# Patient Record
Sex: Male | Born: 1955 | Race: White | Hispanic: No | Marital: Married | Smoking: Current every day smoker
Health system: Southern US, Community
[De-identification: ages and names within clinical notes are randomized; demographics above are authoritative.]

## PROBLEM LIST (undated history)

## (undated) DIAGNOSIS — I10 Essential (primary) hypertension: Secondary | ICD-10-CM

## (undated) DIAGNOSIS — M5430 Sciatica, unspecified side: Secondary | ICD-10-CM

## (undated) DIAGNOSIS — K219 Gastro-esophageal reflux disease without esophagitis: Secondary | ICD-10-CM

## (undated) DIAGNOSIS — I82409 Acute embolism and thrombosis of unspecified deep veins of unspecified lower extremity: Secondary | ICD-10-CM

## (undated) DIAGNOSIS — M549 Dorsalgia, unspecified: Secondary | ICD-10-CM

## (undated) DIAGNOSIS — C9000 Multiple myeloma not having achieved remission: Secondary | ICD-10-CM

## (undated) DIAGNOSIS — E7800 Pure hypercholesterolemia, unspecified: Secondary | ICD-10-CM

## (undated) HISTORY — DX: Sciatica, unspecified side: M54.30

## (undated) HISTORY — PX: BONE MARROW TRANSPLANT: SHX200

## (undated) HISTORY — DX: Acute embolism and thrombosis of unspecified deep veins of unspecified lower extremity: I82.409

## (undated) HISTORY — PX: OTHER SURGICAL HISTORY: SHX169

## (undated) HISTORY — PX: KNEE SURGERY: SHX244

## (undated) HISTORY — PX: TONSILLECTOMY: SUR1361

## (undated) HISTORY — DX: Essential (primary) hypertension: I10

## (undated) HISTORY — DX: Pure hypercholesterolemia, unspecified: E78.00

---

## 2001-07-06 ENCOUNTER — Encounter: Payer: Self-pay | Admitting: Emergency Medicine

## 2001-07-06 ENCOUNTER — Emergency Department (HOSPITAL_COMMUNITY): Admission: EM | Admit: 2001-07-06 | Discharge: 2001-07-06 | Payer: Self-pay | Admitting: Emergency Medicine

## 2001-07-06 ENCOUNTER — Inpatient Hospital Stay (HOSPITAL_COMMUNITY): Admission: EM | Admit: 2001-07-06 | Discharge: 2001-07-06 | Payer: Self-pay | Admitting: *Deleted

## 2001-07-30 ENCOUNTER — Encounter (HOSPITAL_COMMUNITY): Admission: RE | Admit: 2001-07-30 | Discharge: 2001-08-29 | Payer: Self-pay | Admitting: Orthopedic Surgery

## 2003-03-12 ENCOUNTER — Emergency Department (HOSPITAL_COMMUNITY): Admission: EM | Admit: 2003-03-12 | Discharge: 2003-03-12 | Payer: Self-pay | Admitting: Emergency Medicine

## 2003-07-22 ENCOUNTER — Ambulatory Visit (HOSPITAL_COMMUNITY): Admission: RE | Admit: 2003-07-22 | Discharge: 2003-07-22 | Payer: Self-pay | Admitting: Family Medicine

## 2003-10-30 ENCOUNTER — Encounter (HOSPITAL_COMMUNITY): Admission: RE | Admit: 2003-10-30 | Discharge: 2003-11-06 | Payer: Self-pay | Admitting: Internal Medicine

## 2003-11-10 ENCOUNTER — Encounter (HOSPITAL_COMMUNITY): Admission: RE | Admit: 2003-11-10 | Discharge: 2003-12-10 | Payer: Self-pay | Admitting: Internal Medicine

## 2006-04-03 ENCOUNTER — Ambulatory Visit (HOSPITAL_COMMUNITY): Admission: RE | Admit: 2006-04-03 | Discharge: 2006-04-03 | Payer: Self-pay | Admitting: *Deleted

## 2008-03-02 ENCOUNTER — Ambulatory Visit (HOSPITAL_COMMUNITY): Admission: RE | Admit: 2008-03-02 | Discharge: 2008-03-02 | Payer: Self-pay | Admitting: Family Medicine

## 2008-03-06 ENCOUNTER — Ambulatory Visit (HOSPITAL_COMMUNITY): Admission: RE | Admit: 2008-03-06 | Discharge: 2008-03-06 | Payer: Self-pay | Admitting: Family Medicine

## 2008-06-16 ENCOUNTER — Emergency Department (HOSPITAL_COMMUNITY): Admission: EM | Admit: 2008-06-16 | Discharge: 2008-06-16 | Payer: Self-pay | Admitting: Emergency Medicine

## 2009-01-27 ENCOUNTER — Ambulatory Visit (HOSPITAL_COMMUNITY): Admission: RE | Admit: 2009-01-27 | Discharge: 2009-01-27 | Payer: Self-pay | Admitting: Internal Medicine

## 2009-03-08 ENCOUNTER — Ambulatory Visit (HOSPITAL_COMMUNITY): Admission: RE | Admit: 2009-03-08 | Discharge: 2009-03-08 | Payer: Self-pay | Admitting: Family Medicine

## 2010-02-27 ENCOUNTER — Encounter: Payer: Self-pay | Admitting: Internal Medicine

## 2010-04-24 LAB — CREATININE, SERUM

## 2010-06-24 NOTE — Procedures (Signed)
NAME:  Craig Rivera, Craig Rivera NO.:  [id number]   MEDICAL RECORD NO.:  [id number]                   PATIENT TYPE:  EMS   LOCATION:  ED                                   FACILITY:  [facility]   PHYSICIAN:  Edward L. Juanetta Gosling, M.D.             DATE OF BIRTH:  April 17, 1955   DATE OF PROCEDURE:  DATE OF DISCHARGE:  03/12/2003                                EKG INTERPRETATION   FINDINGS:  1. The rhythm is sinus rhythm with a rate in the 60's.  2. There is an incomplete right bundle branch block.  3. T-wave abnormalities are seen which are nonspecific but could be related     to ischemia.  4. Clinical correlation is suggested.   IMPRESSION:  Abnormal electrocardiogram.      ___________________________________________                                            Oneal Deputy. Juanetta Gosling, M.D.   Gwenlyn Found  D:  03/13/2003  T:  03/13/2003  Job:  [id number]

## 2010-06-24 NOTE — Op Note (Signed)
[place]  Patient:    Craig Rivera, Craig Rivera Visit Number: [id number] MRN: [id number]          Service Type: SUR Location: 1800 1830 01 Attending Physician:  Milly Jakob Dictated by:   Harvie Junior, M.D. Proc. Date: 07/06/01 Admit Date:  07/06/2001 Discharge Date: 07/06/2001                             Operative Report  PREOPERATIVE DIAGNOSIS:  Dislocated left shoulder.  POSTOPERATIVE DIAGNOSIS:  Dislocated left shoulder.  PROCEDURE:  Closed reduction of left shoulder.  SURGEON:  Harvie Junior, M.D.  ASSISTANT:  Anise Salvo. Chestine Spore, P.A.  ANESTHESIA:  General.  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  He was ultimately seen at [facility], where attempts were made to reduce the shoulder unsuccessfully.  He was brought down to [facility] emergency room where additional attempts were unsuccessful to reduce the shoulder.  Because of this he is brought to the operating room for closed reduction.  Preoperative x-rays showed an anterior dislocation with an obvious Hill-Sachs lesion.  DESCRIPTION OF PROCEDURE:  The patient was taken to the operating room and after adequate anesthesia was obtained with a general anesthetic, the patient was positioned supine and the left arm was then evaluated under anesthesia and straight in-line traction was used, and the shoulder was popped back into place.  At that point the arm could be put through a range of motion, really not tending to be unstable even at 90 degrees of abduction.  At that point the mini-C-arm was used to make sure that the shoulder was, in fact, located.  At that point the patient was placed into a shoulder immobilizer and taken to recovery, where he was noted to be in satisfactory condition.  Estimated blood loss for the procedure was none. Dictated by:   Harvie Junior, M.D. Attending Physician:  Milly Jakob DD:   07/06/01 TD:  07/08/01 Job: [id number]

## 2010-08-29 ENCOUNTER — Ambulatory Visit (INDEPENDENT_AMBULATORY_CARE_PROVIDER_SITE_OTHER): Payer: PRIVATE HEALTH INSURANCE | Admitting: Gastroenterology

## 2010-08-29 ENCOUNTER — Encounter: Payer: Self-pay | Admitting: Gastroenterology

## 2010-08-29 DIAGNOSIS — Z1211 Encounter for screening for malignant neoplasm of colon: Secondary | ICD-10-CM

## 2010-08-29 NOTE — Progress Notes (Signed)
Referring Provider: Dr. Sherwood Gambler Primary Care Physician: Dr. Sherwood Gambler Primary Gastroenterologist:  Dr. Jena Gauss   Chief Complaint  Patient presents with  . Colonoscopy    change in bowel    HPI:   Craig Rivera is a pleasant 55 year old Caucasian male who presents at the request of Dr. Sherwood Gambler for an initial screening colonoscopy and due to change in bowel habits. Patient reports that over the past few years, he has noticed increasing constipation. Used to have a BM daily, could "set watch by it". A month ago noted small amount of brbpr. Mild lower abdominal cramping prior to BM but relieved after defecation. Denies any bloating or abdominal distension. Denies N/V, loss of appetite, or wt loss. No FH of colon cancer, but he does report his father and sister both had polyps. He does not know what kind.   Past Medical History  Diagnosis Date  . HTN (hypertension)   . Hypercholesterolemia   . Sciatic nerve pain     Past Surgical History  Procedure Date  . Inguinal hernia repair bilateral     as child   . Tonsillectomy   . Knee surgery     Current Outpatient Prescriptions  Medication Sig Dispense Refill  . acetaminophen (TYLENOL) 325 MG tablet Take 650 mg by mouth every 6 (six) hours as needed.        Marland Kitchen amLODipine (NORVASC) 10 MG tablet       . aspirin 81 MG tablet Take 81 mg by mouth daily.        . fish oil-omega-3 fatty acids 1000 MG capsule Take 2 g by mouth daily.        Marland Kitchen loratadine (CLARITIN) 10 MG tablet Take 10 mg by mouth daily.        . Multiple Vitamin (MULTIVITAMIN) capsule Take 1 capsule by mouth daily.        Marland Kitchen NIASPAN 1000 MG CR tablet       . peg 3350 powder (MOVIPREP) 100 G SOLR Take 1 kit (100 g total) by mouth once. As directed Please purchase 1 Fleets enema to use with the prep  1 kit  0    Allergies as of 08/29/2010 - Review Complete 08/29/2010  Allergen Reaction Noted  . Ceclor (cefaclor)  08/29/2010  . Penicillins Diarrhea 08/29/2010    Family History  Problem  Relation Age of Onset  . Colon polyps Father     passed away, Alzhemiers  . Colon polyps Sister   . Migraines Mother     deceased    History   Social History  . Marital Status: Married    Spouse Name: N/A    Number of Children: N/A  . Years of Education: N/A   Occupational History  . Not on file.   Social History Main Topics  . Smoking status: Current Everyday Smoker -- 1.0 packs/day for 40 years    Types: Cigarettes  . Smokeless tobacco: Not on file  . Alcohol Use: Yes     seldom, socially  . Drug Use: No  . Sexually Active: Not on file    Review of Systems: Gen: Denies any fever, chills, loss of appetite, fatigue, weight loss. CV: Denies chest pain, heart palpitations, syncope, peripheral edema. Resp: Denies shortness of breath with rest, cough, wheezing GI: Denies dysphagia or odynophagia. Denies hematemesis, fecal incontinence, or jaundice.  GU : Denies urinary burning, urinary frequency, urinary incontinence.  MS: Denies joint pain, muscle weakness, cramps, limited movement Derm: Denies rash, itching, dry  skin Psych: Denies depression, anxiety, confusion or memory loss  Heme: Denies bruising, bleeding, and enlarged lymph nodes.  Physical Exam: BP 137/78  Pulse 76  Temp(Src) 98.3 F (36.8 C) (Temporal)  Ht 5\' 11"  (1.803 m)  Wt 226 lb (102.513 kg)  BMI 31.52 kg/m2 General:   Alert and oriented. Well-developed, well-nourished, pleasant and cooperative. Head:  Normocephalic and atraumatic. Eyes:  Conjunctiva pink, sclera clear, no icterus.   Conjunctiva pink. Ears:  Normal auditory acuity. Nose:  No deformity, discharge,  or lesions. Mouth:  No deformity or lesions, mucosa pink and moist.  Neck:  Supple, without mass or thyromegaly. Lungs:  Clear to auscultation bilaterally, without wheezing, rales, or rhonchi.  Heart:  S1, S2 present without murmurs noted.  Abdomen:  +BS, soft, non-tender and non-distended. Small possible umbilical hernia, easily reducible.  Without mass or HSM. No rebound or guarding.  Rectal:  Deferred  Msk:  Symmetrical without gross deformities. Normal posture. Extremities:  Without clubbing or edema. Neurologic:  Alert and  oriented x4;  grossly normal neurologically. Skin:  Intact, warm and dry without significant lesions or rashes Cervical Nodes:  No significant cervical adenopathy. Psych:  Alert and cooperative. Normal mood and affect.

## 2010-08-29 NOTE — Patient Instructions (Signed)
We have set you up for a colonoscopy with Dr. Jena Gauss  Follow a high fiber diet.  We will provide further recommendations after this is completed.

## 2010-08-30 ENCOUNTER — Encounter: Payer: Self-pay | Admitting: Gastroenterology

## 2010-08-30 ENCOUNTER — Other Ambulatory Visit: Payer: Self-pay

## 2010-08-30 DIAGNOSIS — Z1211 Encounter for screening for malignant neoplasm of colon: Secondary | ICD-10-CM | POA: Insufficient documentation

## 2010-08-30 MED ORDER — PEG-KCL-NACL-NASULF-NA ASC-C 100 G PO SOLR: 1.0000 | Freq: Once | ORAL | Status: DC

## 2010-08-30 NOTE — Progress Notes (Signed)
Cc to PCP 

## 2010-08-30 NOTE — Assessment & Plan Note (Signed)
55 year old Caucasian male with need for initial screening colonoscopy, somewhat overdue. Subtle change in bowel habits over the past few years, going from regular BMs daily to intermittent constipation. Small amount of brbpr 1 month ago. May be due to benign anorectal source; unable to exclude occult malignancy. Will proceed with colonoscopy in the very near future. As of note, pt reports father and sister had colon polyps, unsure what type.  Proceed with TCS with Dr. Jena Gauss in near future: the risks, benefits, and alternatives have been discussed with the patient in detail. The patient states understanding and desires to proceed. High fiber diet Further recommendations to follow

## 2010-09-12 ENCOUNTER — Other Ambulatory Visit: Payer: Self-pay | Admitting: Internal Medicine

## 2010-09-12 ENCOUNTER — Ambulatory Visit (HOSPITAL_COMMUNITY)
Admission: RE | Admit: 2010-09-12 | Discharge: 2010-09-12 | Disposition: A | Discharge status: Home / Self Care | Payer: BC Managed Care – PPO | Source: Ambulatory Visit | Attending: Internal Medicine | Admitting: Internal Medicine

## 2010-09-12 ENCOUNTER — Encounter: Payer: PRIVATE HEALTH INSURANCE | Admitting: Internal Medicine

## 2010-09-12 ENCOUNTER — Encounter (HOSPITAL_COMMUNITY): Payer: Self-pay | Admitting: *Deleted

## 2010-09-12 ENCOUNTER — Encounter (HOSPITAL_COMMUNITY)
Admission: RE | Disposition: A | Discharge status: Home / Self Care | Payer: Self-pay | Source: Ambulatory Visit | Attending: Internal Medicine

## 2010-09-12 DIAGNOSIS — Z8371 Family history of colonic polyps: Secondary | ICD-10-CM | POA: Insufficient documentation

## 2010-09-12 DIAGNOSIS — R198 Other specified symptoms and signs involving the digestive system and abdomen: Secondary | ICD-10-CM

## 2010-09-12 DIAGNOSIS — Z83719 Family history of colon polyps, unspecified: Secondary | ICD-10-CM | POA: Insufficient documentation

## 2010-09-12 DIAGNOSIS — Z79899 Other long term (current) drug therapy: Secondary | ICD-10-CM | POA: Insufficient documentation

## 2010-09-12 DIAGNOSIS — D126 Benign neoplasm of colon, unspecified: Secondary | ICD-10-CM

## 2010-09-12 DIAGNOSIS — D129 Benign neoplasm of anus and anal canal: Secondary | ICD-10-CM | POA: Insufficient documentation

## 2010-09-12 DIAGNOSIS — Z7982 Long term (current) use of aspirin: Secondary | ICD-10-CM | POA: Insufficient documentation

## 2010-09-12 DIAGNOSIS — D128 Benign neoplasm of rectum: Secondary | ICD-10-CM | POA: Insufficient documentation

## 2010-09-12 DIAGNOSIS — K620 Anal polyp: Secondary | ICD-10-CM

## 2010-09-12 DIAGNOSIS — I10 Essential (primary) hypertension: Secondary | ICD-10-CM | POA: Insufficient documentation

## 2010-09-12 DIAGNOSIS — E785 Hyperlipidemia, unspecified: Secondary | ICD-10-CM | POA: Insufficient documentation

## 2010-09-12 DIAGNOSIS — K621 Rectal polyp: Secondary | ICD-10-CM

## 2010-09-12 DIAGNOSIS — Z800 Family history of malignant neoplasm of digestive organs: Secondary | ICD-10-CM

## 2010-09-12 HISTORY — PX: COLONOSCOPY: SHX5424

## 2010-09-12 SURGERY — COLONOSCOPY
Anesthesia: Moderate Sedation

## 2010-09-12 MED ORDER — MIDAZOLAM HCL 5 MG/5ML IJ SOLN
INTRAMUSCULAR | Status: AC
  Filled 2010-09-12: qty 10

## 2010-09-12 MED ORDER — MIDAZOLAM HCL 5 MG/5ML IJ SOLN
INTRAMUSCULAR | Status: DC | PRN
  Administered 2010-09-12 (×2): 2 mg via INTRAVENOUS
  Administered 2010-09-12: 1 mg via INTRAVENOUS

## 2010-09-12 MED ORDER — SODIUM CHLORIDE 0.45 % IV SOLN
Freq: Once | INTRAVENOUS | Status: AC
  Administered 2010-09-12: 12:00:00 via INTRAVENOUS

## 2010-09-12 MED ORDER — STERILE WATER FOR IRRIGATION IR SOLN
Status: DC | PRN
  Administered 2010-09-12: 12:00:00

## 2010-09-12 MED ORDER — MEPERIDINE HCL 100 MG/ML IJ SOLN
INTRAMUSCULAR | Status: AC
  Filled 2010-09-12: qty 2

## 2010-09-12 MED ORDER — MEPERIDINE HCL 100 MG/ML IJ SOLN
INTRAMUSCULAR | Status: DC | PRN
  Administered 2010-09-12: 25 mg via INTRAVENOUS
  Administered 2010-09-12: 50 mg via INTRAVENOUS

## 2010-09-12 NOTE — H&P (Signed)
Gerrit Halls, NP  08/30/2010 12:45 PM  Signed     Referring Provider: Dr. Sherwood Gambler Primary Care Physician: Dr. Sherwood Gambler Primary Gastroenterologist:  Dr. Jena Gauss     Chief Complaint   Patient presents with   .  Colonoscopy       change in bowel      HPI:    Mr. Fragoso is a pleasant 55 year old Caucasian male who presents at the request of Dr. Sherwood Gambler for an initial screening colonoscopy and due to change in bowel habits. Patient reports that over the past few years, he has noticed increasing constipation. Used to have a BM daily, could "set watch by it". A month ago noted small amount of brbpr. Mild lower abdominal cramping prior to BM but relieved after defecation. Denies any bloating or abdominal distension. Denies N/V, loss of appetite, or wt loss. No FH of colon cancer, but he does report his father and sister both had polyps. He does not know what kind.     Past Medical History   Diagnosis  Date   .  HTN (hypertension)     .  Hypercholesterolemia     .  Sciatic nerve pain         Past Surgical History   Procedure  Date   .  Inguinal hernia repair bilateral         as child    .  Tonsillectomy     .  Knee surgery         Current Outpatient Prescriptions   Medication  Sig  Dispense  Refill   .  acetaminophen (TYLENOL) 325 MG tablet  Take 650 mg by mouth every 6 (six) hours as needed.           Marland Kitchen  amLODipine (NORVASC) 10 MG tablet           .  aspirin 81 MG tablet  Take 81 mg by mouth daily.           .  fish oil-omega-3 fatty acids 1000 MG capsule  Take 2 g by mouth daily.           Marland Kitchen  loratadine (CLARITIN) 10 MG tablet  Take 10 mg by mouth daily.           .  Multiple Vitamin (MULTIVITAMIN) capsule  Take 1 capsule by mouth daily.           Marland Kitchen  NIASPAN 1000 MG CR tablet           .  peg 3350 powder (MOVIPREP) 100 G SOLR  Take 1 kit (100 g total) by mouth once. As directed  Please purchase 1 Fleets enema to use with the prep   1 kit   0       Allergies as of 08/29/2010 - Review  Complete 08/29/2010   Allergen  Reaction  Noted   .  Ceclor (cefaclor)    08/29/2010   .  Penicillins  Diarrhea  08/29/2010       Family History   Problem  Relation  Age of Onset   .  Colon polyps  Father         passed away, Alzhemiers   .  Colon polyps  Sister     .  Migraines  Mother         deceased       History       Social History   .  Marital Status:  Married  Spouse Name:  N/A       Number of Children:  N/A   .  Years of Education:  N/A       Occupational History   .  Not on file.       Social History Main Topics   .  Smoking status:  Current Everyday Smoker -- 1.0 packs/day for 40 years       Types:  Cigarettes   .  Smokeless tobacco:  Not on file   .  Alcohol Use:  Yes         seldom, socially   .  Drug Use:  No   .  Sexually Active:  Not on file        Review of Systems: Gen: Denies any fever, chills, loss of appetite, fatigue, weight loss. CV: Denies chest pain, heart palpitations, syncope, peripheral edema. Resp: Denies shortness of breath with rest, cough, wheezing GI: Denies dysphagia or odynophagia. Denies hematemesis, fecal incontinence, or jaundice.   GU : Denies urinary burning, urinary frequency, urinary incontinence.   MS: Denies joint pain, muscle weakness, cramps, limited movement Derm: Denies rash, itching, dry skin Psych: Denies depression, anxiety, confusion or memory loss   Heme: Denies bruising, bleeding, and enlarged lymph nodes.   Physical Exam: BP 137/78  Pulse 76  Temp(Src) 98.3 F (36.8 C) (Temporal)  Ht 5\' 11"  (1.803 m)  Wt 226 lb (102.513 kg)  BMI 31.52 kg/m2 General:   Alert and oriented. Well-developed, well-nourished, pleasant and cooperative. Head:  Normocephalic and atraumatic. Eyes:  Conjunctiva pink, sclera clear, no icterus.   Conjunctiva pink. Ears:  Normal auditory acuity. Nose:  No deformity, discharge,  or lesions. Mouth:  No deformity or lesions, mucosa pink and moist.   Neck:  Supple, without  mass or thyromegaly. Lungs:  Clear to auscultation bilaterally, without wheezing, rales, or rhonchi.   Heart:  S1, S2 present without murmurs noted.   Abdomen:  +BS, soft, non-tender and non-distended. Small possible umbilical hernia, easily reducible. Without mass or HSM. No rebound or guarding.   Rectal:  Deferred   Msk:  Symmetrical without gross deformities. Normal posture. Extremities:  Without clubbing or edema. Neurologic:  Alert and  oriented x4;  grossly normal neurologically. Skin:  Intact, warm and dry without significant lesions or rashes Cervical Nodes:  No significant cervical adenopathy. Psych:  Alert and cooperative. Normal mood and affect.       Glendora Score  08/30/2010  4:55 PM  Signed Cc to PCP        Encounter for screening colonoscopy - Gerrit Halls, NP  08/30/2010 12:45 PM  Signed 55 year old Caucasian male with need for initial screening colonoscopy, somewhat overdue. Subtle change in bowel habits over the past few years, going from regular BMs daily to intermittent constipation. Small amount of brbpr 1 month ago. May be due to benign anorectal source; unable to exclude occult malignancy. Will proceed with colonoscopy in the very near future. As of note, pt reports father and sister had colon polyps, unsure what type.   Proceed with TCS with Dr. Jena Gauss in near future: the risks, benefits, and alternatives have been discussed with the patient in detail. The patient states understanding and desires to proceed. High fiber diet Further recommendations to follow    I have seen the patient prior to the procedure(s) today and reviewed the history and physical / consultation from 08/16/10.  There have been no changes. After consideration of the risks, benefits, alternatives and imponderables,  the patient has consented to the procedure(s).

## 2010-09-20 ENCOUNTER — Encounter (HOSPITAL_COMMUNITY): Payer: Self-pay | Admitting: Internal Medicine

## 2010-10-07 ENCOUNTER — Other Ambulatory Visit (HOSPITAL_COMMUNITY): Payer: Self-pay | Admitting: Family Medicine

## 2010-10-07 DIAGNOSIS — R05 Cough: Secondary | ICD-10-CM

## 2010-10-07 DIAGNOSIS — J209 Acute bronchitis, unspecified: Secondary | ICD-10-CM

## 2010-10-12 ENCOUNTER — Ambulatory Visit (HOSPITAL_COMMUNITY)
Admission: RE | Admit: 2010-10-12 | Discharge: 2010-10-12 | Disposition: A | Discharge status: Home / Self Care | Payer: BC Managed Care – PPO | Source: Ambulatory Visit | Attending: Family Medicine | Admitting: Family Medicine

## 2010-10-12 DIAGNOSIS — J209 Acute bronchitis, unspecified: Secondary | ICD-10-CM | POA: Insufficient documentation

## 2010-10-12 DIAGNOSIS — E7800 Pure hypercholesterolemia, unspecified: Secondary | ICD-10-CM | POA: Insufficient documentation

## 2010-10-12 DIAGNOSIS — J984 Other disorders of lung: Secondary | ICD-10-CM | POA: Insufficient documentation

## 2010-10-12 DIAGNOSIS — R05 Cough: Secondary | ICD-10-CM

## 2010-10-12 MED ORDER — IOHEXOL 300 MG/ML  SOLN
80.0000 mL | Freq: Once | INTRAMUSCULAR | Status: AC | PRN
  Administered 2010-10-12: 80 mL via INTRAVENOUS

## 2011-04-20 DIAGNOSIS — L821 Other seborrheic keratosis: Secondary | ICD-10-CM | POA: Insufficient documentation

## 2011-05-10 DIAGNOSIS — C4491 Basal cell carcinoma of skin, unspecified: Secondary | ICD-10-CM | POA: Insufficient documentation

## 2013-04-02 DIAGNOSIS — C9000 Multiple myeloma not having achieved remission: Secondary | ICD-10-CM | POA: Insufficient documentation

## 2013-10-23 DIAGNOSIS — Z9484 Stem cells transplant status: Secondary | ICD-10-CM | POA: Insufficient documentation

## 2013-12-01 DIAGNOSIS — R7989 Other specified abnormal findings of blood chemistry: Secondary | ICD-10-CM | POA: Insufficient documentation

## 2013-12-08 DIAGNOSIS — L509 Urticaria, unspecified: Secondary | ICD-10-CM | POA: Insufficient documentation

## 2013-12-15 DIAGNOSIS — Z720 Tobacco use: Secondary | ICD-10-CM | POA: Insufficient documentation

## 2013-12-15 DIAGNOSIS — F17210 Nicotine dependence, cigarettes, uncomplicated: Secondary | ICD-10-CM | POA: Insufficient documentation

## 2014-01-19 DIAGNOSIS — E876 Hypokalemia: Secondary | ICD-10-CM | POA: Insufficient documentation

## 2014-03-03 DIAGNOSIS — K5732 Diverticulitis of large intestine without perforation or abscess without bleeding: Secondary | ICD-10-CM | POA: Insufficient documentation

## 2014-03-03 DIAGNOSIS — Z9481 Bone marrow transplant status: Secondary | ICD-10-CM | POA: Insufficient documentation

## 2014-03-03 DIAGNOSIS — D801 Nonfamilial hypogammaglobulinemia: Secondary | ICD-10-CM | POA: Insufficient documentation

## 2014-04-07 DIAGNOSIS — R52 Pain, unspecified: Secondary | ICD-10-CM | POA: Insufficient documentation

## 2014-05-06 DIAGNOSIS — E860 Dehydration: Secondary | ICD-10-CM | POA: Insufficient documentation

## 2014-06-29 DIAGNOSIS — K5901 Slow transit constipation: Secondary | ICD-10-CM | POA: Insufficient documentation

## 2014-07-13 DIAGNOSIS — D171 Benign lipomatous neoplasm of skin and subcutaneous tissue of trunk: Secondary | ICD-10-CM | POA: Insufficient documentation

## 2014-07-13 DIAGNOSIS — S22000A Wedge compression fracture of unspecified thoracic vertebra, initial encounter for closed fracture: Secondary | ICD-10-CM | POA: Insufficient documentation

## 2014-08-03 NOTE — Patient Instructions (Signed)
Craig Rivera  08/03/2014     @PREFPERIOPPHARMACY @   Your procedure is scheduled on 08/21/2014  Report to [facility] at 10:00 A.M.  Call this number if you have problems the morning of surgery:  [phone]   Remember:  Do not eat food or drink liquids after midnight.  Take these medicines the morning of surgery with A SIP OF WATER Amlodipine, Fentanyl patch, Flonase, Levaquin, Flomax, Protonix, Oxydosone   Do not wear jewelry, make-up or nail polish.  Do not wear lotions, powders, or perfumes.  You may wear deodorant.  Do not shave 48 hours prior to surgery.  Men may shave face and neck.  Do not bring valuables to the hospital.  [facility] is not responsible for any belongings or valuables.  Contacts, dentures or bridgework may not be worn into surgery.  Leave your suitcase in the car.  After surgery it may be brought to your room.  For patients admitted to the hospital, discharge time will be determined by your treatment team.  Patients discharged the day of surgery will not be allowed to drive home.    Please read over the following fact sheets that you were given. Anesthesia Post-op Instructions      PATIENT INSTRUCTIONS POST-ANESTHESIA  IMMEDIATELY FOLLOWING SURGERY:  Do not drive or operate machinery for the first twenty four hours after surgery.  Do not make any important decisions for twenty four hours after surgery or while taking narcotic pain medications or sedatives.  If you develop intractable nausea and vomiting or a severe headache please notify your doctor immediately.  FOLLOW-UP:  Please make an appointment with your surgeon as instructed. You do not need to follow up with anesthesia unless specifically instructed to do so.  WOUND CARE INSTRUCTIONS (if applicable):  Keep a dry clean dressing on the anesthesia/puncture wound site if there is drainage.  Once the wound has quit draining you may leave it open to air.  Generally you should leave the bandage  intact for twenty four hours unless there is drainage.  If the epidural site drains for more than 36-48 hours please call the anesthesia department.  QUESTIONS?:  Please feel free to call your physician or the hospital operator if you have any questions, and they will be happy to assist you.      Cataract A cataract is a clouding of the lens of the eye. When a lens becomes cloudy, vision is reduced based on the degree and nature of the clouding. Many cataracts reduce vision to some degree. Some cataracts make people more near-sighted as they develop. Other cataracts increase glare. Cataracts that are ignored and become worse can sometimes look white. The white color can be seen through the pupil. CAUSES   Aging. However, cataracts may occur at any age, even in newborns.  Certain drugs.  Trauma to the eye.  Certain diseases such as diabetes.  Specific eye diseases such as chronic inflammation inside the eye or a sudden attack of a rare form of glaucoma.  Inherited or acquired medical problems. SYMPTOMS   Gradual, progressive drop in vision in the affected eye.  Severe, rapid visual loss. This most often happens when trauma is the cause. DIAGNOSIS  To detect a cataract, an eye doctor examines the lens. Cataracts are best diagnosed with an exam of the eyes with the pupils enlarged (dilated) by drops.  TREATMENT  For an early cataract, vision may improve by using different eyeglasses or stronger lighting. If that does  not help your vision, surgery is the only effective treatment. A cataract needs to be surgically removed when vision loss interferes with your everyday activities, such as driving, reading, or watching TV. A cataract may also have to be removed if it prevents examination or treatment of another eye problem. Surgery removes the cloudy lens and usually replaces it with a substitute lens (intraocular lens, IOL).  At a time when both you and your doctor agree, the cataract will be  surgically removed. If you have cataracts in both eyes, only one is usually removed at a time. This allows the operated eye to heal and be out of danger from any possible problems after surgery (such as infection or poor wound healing). In rare cases, a cataract may be doing damage to your eye. In these cases, your caregiver may advise surgical removal right away. The vast majority of people who have cataract surgery have better vision afterward. HOME CARE INSTRUCTIONS  If you are not planning surgery, you may be asked to do the following:  Use different eyeglasses.  Use stronger or brighter lighting.  Ask your eye doctor about reducing your medicine dose or changing medicines if it is thought that a medicine caused your cataract. Changing medicines does not make the cataract go away on its own.  Become familiar with your surroundings. Poor vision can lead to injury. Avoid bumping into things on the affected side. You are at a higher risk for tripping or falling.  Exercise extreme care when driving or operating machinery.  Wear sunglasses if you are sensitive to bright light or experiencing problems with glare. SEEK IMMEDIATE MEDICAL CARE IF:   You have a worsening or sudden vision loss.  You notice redness, swelling, or increasing pain in the eye.  You have a fever. Document Released: 01/23/2005 Document Revised: 04/17/2011 Document Reviewed: 09/16/2010 [facility] Patient Information 2015 [place], [place]. This information is not intended to replace advice given to you by your health care provider. Make sure you discuss any questions you have with your health care provider.

## 2014-08-04 ENCOUNTER — Other Ambulatory Visit: Payer: Self-pay

## 2014-08-04 ENCOUNTER — Encounter (HOSPITAL_COMMUNITY)
Admission: RE | Admit: 2014-08-04 | Discharge: 2014-08-04 | Disposition: A | Discharge status: Home / Self Care | Payer: Managed Care, Other (non HMO) | Source: Ambulatory Visit | Attending: Ophthalmology | Admitting: Ophthalmology

## 2014-08-04 ENCOUNTER — Encounter (HOSPITAL_COMMUNITY): Payer: Self-pay

## 2014-08-04 DIAGNOSIS — H2511 Age-related nuclear cataract, right eye: Secondary | ICD-10-CM | POA: Diagnosis not present

## 2014-08-04 DIAGNOSIS — Z01818 Encounter for other preprocedural examination: Secondary | ICD-10-CM | POA: Diagnosis not present

## 2014-08-04 HISTORY — DX: Multiple myeloma not having achieved remission: C90.00

## 2014-08-04 HISTORY — DX: Dorsalgia, unspecified: M54.9

## 2014-08-11 ENCOUNTER — Ambulatory Visit (HOSPITAL_COMMUNITY): Payer: Managed Care, Other (non HMO) | Admitting: Anesthesiology

## 2014-08-11 ENCOUNTER — Encounter (HOSPITAL_COMMUNITY)
Admission: RE | Disposition: A | Discharge status: Home / Self Care | Payer: Self-pay | Source: Ambulatory Visit | Attending: Ophthalmology

## 2014-08-11 ENCOUNTER — Ambulatory Visit (HOSPITAL_COMMUNITY)
Admission: RE | Admit: 2014-08-11 | Discharge: 2014-08-11 | Disposition: A | Discharge status: Home / Self Care | Payer: Managed Care, Other (non HMO) | Source: Ambulatory Visit | Attending: Ophthalmology | Admitting: Ophthalmology

## 2014-08-11 ENCOUNTER — Encounter (HOSPITAL_COMMUNITY): Payer: Self-pay | Admitting: *Deleted

## 2014-08-11 DIAGNOSIS — Z79899 Other long term (current) drug therapy: Secondary | ICD-10-CM | POA: Insufficient documentation

## 2014-08-11 DIAGNOSIS — I4510 Unspecified right bundle-branch block: Secondary | ICD-10-CM | POA: Diagnosis not present

## 2014-08-11 DIAGNOSIS — Z8371 Family history of colonic polyps: Secondary | ICD-10-CM | POA: Diagnosis not present

## 2014-08-11 DIAGNOSIS — I10 Essential (primary) hypertension: Secondary | ICD-10-CM | POA: Insufficient documentation

## 2014-08-11 DIAGNOSIS — F17210 Nicotine dependence, cigarettes, uncomplicated: Secondary | ICD-10-CM | POA: Insufficient documentation

## 2014-08-11 DIAGNOSIS — M5430 Sciatica, unspecified side: Secondary | ICD-10-CM | POA: Diagnosis not present

## 2014-08-11 DIAGNOSIS — E780 Pure hypercholesterolemia: Secondary | ICD-10-CM | POA: Insufficient documentation

## 2014-08-11 DIAGNOSIS — Z880 Allergy status to penicillin: Secondary | ICD-10-CM | POA: Diagnosis not present

## 2014-08-11 DIAGNOSIS — H2511 Age-related nuclear cataract, right eye: Secondary | ICD-10-CM | POA: Insufficient documentation

## 2014-08-11 HISTORY — PX: CATARACT EXTRACTION W/PHACO: SHX586

## 2014-08-11 SURGERY — PHACOEMULSIFICATION, CATARACT, WITH IOL INSERTION
Anesthesia: Monitor Anesthesia Care | Laterality: Right

## 2014-08-11 MED ORDER — NA HYALUR & NA CHOND-NA HYALUR 0.55-0.5 ML IO KIT
PACK | INTRAOCULAR | Status: DC | PRN
  Administered 2014-08-11: 1 via OPHTHALMIC

## 2014-08-11 MED ORDER — LACTATED RINGERS IV SOLN
INTRAVENOUS | Status: DC
Start: 2014-08-11 — End: 2014-08-11
  Administered 2014-08-11: 1000 mL via INTRAVENOUS

## 2014-08-11 MED ORDER — FENTANYL CITRATE (PF) 100 MCG/2ML IJ SOLN
25.0000 ug | INTRAMUSCULAR | Status: AC
  Administered 2014-08-11 (×2): 25 ug via INTRAVENOUS

## 2014-08-11 MED ORDER — LIDOCAINE HCL 3.5 % OP GEL
OPHTHALMIC | Status: DC | PRN
  Administered 2014-08-11: 1 via OPHTHALMIC

## 2014-08-11 MED ORDER — PHENYLEPHRINE-KETOROLAC 1-0.3 % IO SOLN
INTRAOCULAR | Status: DC | PRN
  Administered 2014-08-11: 500 mL via OPHTHALMIC

## 2014-08-11 MED ORDER — FENTANYL CITRATE (PF) 100 MCG/2ML IJ SOLN
25.0000 ug | INTRAMUSCULAR | Status: DC | PRN
Start: 2014-08-11 — End: 2014-08-11

## 2014-08-11 MED ORDER — LIDOCAINE HCL 3.5 % OP GEL: 1.0000 "application " | Freq: Once | OPHTHALMIC | Status: DC

## 2014-08-11 MED ORDER — MIDAZOLAM HCL 2 MG/2ML IJ SOLN
1.0000 mg | INTRAMUSCULAR | Status: DC | PRN
  Administered 2014-08-11: 2 mg via INTRAVENOUS

## 2014-08-11 MED ORDER — POVIDONE-IODINE 5 % OP SOLN
OPHTHALMIC | Status: DC | PRN
  Administered 2014-08-11: 1 via OPHTHALMIC

## 2014-08-11 MED ORDER — TETRACAINE HCL 0.5 % OP SOLN
1.0000 [drp] | OPHTHALMIC | Status: AC
  Administered 2014-08-11 (×3): 1 [drp] via OPHTHALMIC

## 2014-08-11 MED ORDER — BSS IO SOLN
INTRAOCULAR | Status: DC | PRN
  Administered 2014-08-11: 15 mL

## 2014-08-11 MED ORDER — CYCLOPENTOLATE-PHENYLEPHRINE 0.2-1 % OP SOLN
1.0000 [drp] | OPHTHALMIC | Status: AC
  Administered 2014-08-11 (×3): 1 [drp] via OPHTHALMIC

## 2014-08-11 MED ORDER — ONDANSETRON HCL 4 MG/2ML IJ SOLN
4.0000 mg | Freq: Once | INTRAMUSCULAR | Status: DC | PRN
Start: 2014-08-11 — End: 2014-08-11

## 2014-08-11 MED ORDER — PHENYLEPHRINE-KETOROLAC 1-0.3 % IO SOLN
INTRAOCULAR | Status: AC
  Filled 2014-08-11: qty 4

## 2014-08-11 MED ORDER — MIDAZOLAM HCL 2 MG/2ML IJ SOLN
INTRAMUSCULAR | Status: AC
  Filled 2014-08-11: qty 2

## 2014-08-11 MED ORDER — FENTANYL CITRATE (PF) 100 MCG/2ML IJ SOLN
INTRAMUSCULAR | Status: AC
  Filled 2014-08-11: qty 2

## 2014-08-11 MED ORDER — TETRACAINE 0.5 % OP SOLN OPTIME - NO CHARGE
OPHTHALMIC | Status: DC | PRN
  Administered 2014-08-11: 2 [drp] via OPHTHALMIC

## 2014-08-11 SURGICAL SUPPLY — 28 items
CAPSULAR TENSION RING-AMO (OPHTHALMIC RELATED) IMPLANT
CLOTH BEACON ORANGE TIMEOUT ST (SAFETY) ×1 IMPLANT
GLOVE BIO SURGEON STRL SZ7.5 (GLOVE) IMPLANT
GLOVE BIOGEL M 6.5 STRL (GLOVE) IMPLANT
GLOVE BIOGEL PI IND STRL 6.5 (GLOVE) IMPLANT
GLOVE BIOGEL PI IND STRL 7.0 (GLOVE) IMPLANT
GLOVE BIOGEL PI INDICATOR 6.5 (GLOVE)
GLOVE BIOGEL PI INDICATOR 7.0 (GLOVE) ×2
GLOVE ECLIPSE 6.5 STRL STRAW (GLOVE) IMPLANT
GLOVE ECLIPSE 7.5 STRL STRAW (GLOVE) IMPLANT
GLOVE EXAM NITRILE LRG STRL (GLOVE) IMPLANT
GLOVE EXAM NITRILE MD LF STRL (GLOVE) IMPLANT
GLOVE SKINSENSE NS SZ6.5 (GLOVE)
GLOVE SKINSENSE NS SZ7.0 (GLOVE)
GLOVE SKINSENSE STRL SZ6.5 (GLOVE) IMPLANT
GLOVE SKINSENSE STRL SZ7.0 (GLOVE) IMPLANT
INST SET CATARACT ~~LOC~~ (KITS) ×2 IMPLANT
KIT VITRECTOMY (OPHTHALMIC RELATED) IMPLANT
LENS ALC ACRYL/TECN (Ophthalmic Related) ×2 IMPLANT
PAD ARMBOARD 7.5X6 YLW CONV (MISCELLANEOUS) ×1 IMPLANT
PROC W NO LENS (INTRAOCULAR LENS)
PROC W SPEC LENS (INTRAOCULAR LENS)
PROCESS W NO LENS (INTRAOCULAR LENS) IMPLANT
PROCESS W SPEC LENS (INTRAOCULAR LENS) IMPLANT
RETRACTOR IRIS SIGHTPATH (OPHTHALMIC RELATED) IMPLANT
RING MALYGIN (MISCELLANEOUS) IMPLANT
VISCOELASTIC ADDITIONAL (OPHTHALMIC RELATED) IMPLANT
WATER STERILE IRR 250ML POUR (IV SOLUTION) ×1 IMPLANT

## 2014-08-11 NOTE — Brief Op Note (Signed)
08/11/2014  11:48 AM  PATIENT:  Craig Rivera  59 y.o. male  PRE-OPERATIVE DIAGNOSIS:  nuclear cataract right eye  POST-OPERATIVE DIAGNOSIS:  nuclear cataract right eye  PROCEDURE:  Procedure(s): CATARACT EXTRACTION PHACO AND INTRAOCULAR LENS PLACEMENT ([place])  SURGEON:  Surgeon(s): Williams Che, MD  ASSISTANTS: Bonney Roussel, CST   ANESTHESIA STAFF: Anesthesiologist: Lerry Liner, MD CRNA: Ollen Bowl, CRNA  ANESTHESIA:   topical and MAC  REQUESTED LENS POWER: 21.0  LENS IMPLANT INFORMATION:   [employer] [id number]  S/n [id number]  Exp 03/2019  CUMULATIVE DISSIPATED ENERGY:4.88  INDICATIONS:see scanned office H&P  OP FINDINGS:dense NS  COMPLICATIONS:None  DICTATION #: none  PLAN OF CARE: as above  PATIENT DISPOSITION:  Short Stay

## 2014-08-11 NOTE — H&P (Signed)
I have reviewed the pre printed H&P, the patient was re-examined, and I have identified no significant interval changes in the patient's medical condition.  There is no change in the plan of care since the history and physical of record. 

## 2014-08-11 NOTE — Anesthesia Postprocedure Evaluation (Signed)
  Anesthesia Post-op Note  Patient: Craig Rivera  Procedure(s) Performed: Procedure(s) with comments: CATARACT EXTRACTION PHACO AND INTRAOCULAR LENS PLACEMENT ([facility]) (Right) - CDE 4.88  Patient Location: Short Stay  Anesthesia Type:MAC  Level of Consciousness: awake, alert  and oriented  Airway and Oxygen Therapy: Patient Spontanous Breathing  Post-op Pain: none  Post-op Assessment: Post-op Vital signs reviewed, Patient's Cardiovascular Status Stable, Respiratory Function Stable, Patent Airway and No signs of Nausea or vomiting              Post-op Vital Signs: Reviewed and stable  Last Vitals:  Filed Vitals:   08/11/14 1110  BP: 101/65  Pulse:   Temp:   Resp: 16    Complications: No apparent anesthesia complications

## 2014-08-11 NOTE — Transfer of Care (Signed)
Immediate Anesthesia Transfer of Care Note  Patient: Craig Rivera  Procedure(s) Performed: Procedure(s) with comments: CATARACT EXTRACTION PHACO AND INTRAOCULAR LENS PLACEMENT ([facility]) (Right) - CDE 4.88  Patient Location: Short Stay  Anesthesia Type:MAC  Level of Consciousness: awake  Airway & Oxygen Therapy: Patient Spontanous Breathing  Post-op Assessment: Report given to RN  Post vital signs: Reviewed  Last Vitals:  Filed Vitals:   08/11/14 1110  BP: 101/65  Pulse:   Temp:   Resp: 16    Complications: No apparent anesthesia complications

## 2014-08-11 NOTE — Op Note (Signed)
Author  Service  Chief Strategy Officer Type  Cosign  Status  File Time  Note Time    Williams Che, MD Ophthalmology Physician  Signed 08/11/2014 11:52 AM 08/11/2014 11:48 AM     Williams Che, MD Physician Signed Ophthalmology Service date: 08/11/2014 11:48 AM  Addend Delete Bookmark Copy         Expand All Collapse All    08/11/2014  11:48 AM  PATIENT: Craig Rivera 59 y.o. male  PRE-OPERATIVE DIAGNOSIS: nuclear cataract right eye  POST-OPERATIVE DIAGNOSIS: nuclear cataract right eye  PROCEDURE: Procedure(s):  CATARACT EXTRACTION PHACO AND INTRAOCULAR LENS PLACEMENT ([facility])  SURGEON: Surgeon(s):  Williams Che, MD  ASSISTANTS: Bonney Roussel, CST  ANESTHESIA STAFF: Anesthesiologist: Lerry Liner, MD  CRNA: Ollen Bowl, CRNA  ANESTHESIA: topical and MAC  REQUESTED LENS POWER: 21.0  LENS IMPLANT INFORMATION: Alcon [id number] S/n [id number] Exp 03/2019  CUMULATIVE DISSIPATED ENERGY:4.88  INDICATIONS:see scanned office H&P  OP FINDINGS:dense NS  COMPLICATIONS:None PROCEDURE:  The patient was brought to the operating room in good condition.  The operative eye was prepped and draped in the usual fashion for intraocular surgery.  Lidocaine gel was dropped onto the eye.  A 2.4 mm 10 O'clock near clear corneal stepped incision and a 12 O'clock stab incision were created.  Viscoat was instilled into the anterior chamber.  The 5 mm anterior capsulorhexis was performed with a bent needle cystotome and Utrata forceps.  The lens was hydrodissected and hydrodelineated with a cannula and balanced salt solution and rotated with a Kuglen hook.  Phacoemulsification was perfomed in the divide and conquer technique.  The remaining cortex was removed with I&A and the capsular surfaces polished as necessary.  Provisc was placed into the capsular bag and the lens inserted with the Alcon inserter.  The viscoelastic was removed with I&A and the lens "rocked" into position.  The wounds were hydrated and te anterior  chamber was refilled with balanced salt solution.  The wounds were checked for leakage and rehydrated as necessary.  The lid speculum and drapes were removed and the patient was transported to short stay in good condition.   PATIENT DISPOSITION: Short Stay

## 2014-08-11 NOTE — Discharge Instructions (Signed)
Craig Rivera 08/11/2014 Dr. Iona Hansen Post operative Instructions for Cataract Patients  These instructions are for Craig Rivera and pertain to the operative eye.  1.  Resume your normal diet and previous oral medicines.  2. Your Follow-up appointment is at Dr. Iona Hansen' office in [place] on 08/12/14 at 845.  3. You may leave the hospital when your driver is present and your nurse releases you.  4. Begin Pred Forte (prednisolone acetate 1%), Acular LS (ketorolac tromethamine .4%) and Gatifloxacin 0.5% eye drops; 1 drop each 4 times daily to operative eye. Begin 3 hours after discharge from Short Stay Unit.  Moxifloxacin 0.5% may be substituted for Gatifloxacin using the same instructions.  48. Page Dr. Iona Hansen via beeper [phone] for significant pain in or around operative eye that is not relieved by Tylenol.  6. If you took Plavix before surgery, restart it at the usual dose on the evening of surgery.  7. Wear dark glasses as necessary for excessive light sensitivity.  8. Do no forcefully rub you your operative eye.  9. Keep your operative eye dry for 1 week. You may gently clean your eyelids with a damp washcloth.  10. You may resume normal occupational activities in one week and resume driving as tolerated after the first post operative visit.  11. It is normal to have blurred vision and a scratchy sensation following surgery.  Dr. Iona Hansen: [phone]  Monitored Anesthesia Care Monitored anesthesia care is an anesthesia service for a medical procedure. Anesthesia is the loss of the ability to feel pain. It is produced by medicines called anesthetics. It may affect a small area of your body (local anesthesia), a large area of your body (regional anesthesia), or your entire body (general anesthesia). The need for monitored anesthesia care depends your procedure, your condition, and the potential need for regional or general anesthesia. It is often provided during procedures where:   General  anesthesia may be needed if there are complications. This is because you need special care when you are under general anesthesia.   You will be under local or regional anesthesia. This is so that you are able to have higher levels of anesthesia if needed.   You will receive calming medicines (sedatives). This is especially the case if sedatives are given to put you in a semi-conscious state of relaxation (deep sedation). This is because the amount of sedative needed to produce this state can be hard to predict. Too much of a sedative can produce general anesthesia. Monitored anesthesia care is performed by one or more health care providers who have special training in all types of anesthesia. You will need to meet with these health care providers before your procedure. During this meeting, they will ask you about your medical history. They will also give you instructions to follow. (For example, you will need to stop eating and drinking before your procedure. You may also need to stop or change medicines you are taking.) During your procedure, your health care providers will stay with you. They will:   Watch your condition. This includes watching your blood pressure, breathing, and level of pain.   Diagnose and treat problems that occur.   Give medicines if they are needed. These may include calming medicines (sedatives) and anesthetics.   Make sure you are comfortable.  Having monitored anesthesia care does not necessarily mean that you will be under anesthesia. It does mean that your health care providers will be able to manage anesthesia if you need it  or if it occurs. It also means that you will be able to have a different type of anesthesia than you are having if you need it. When your procedure is complete, your health care providers will continue to watch your condition. They will make sure any medicines wear off before you are allowed to go home.  Document Released: 10/19/2004 Document  Revised: 06/09/2013 Document Reviewed: 03/06/2012 [facility] Patient Information 2015 [place], [place]. This information is not intended to replace advice given to you by your health care provider. Make sure you discuss any questions you have with your health care provider.

## 2014-08-11 NOTE — Anesthesia Preprocedure Evaluation (Signed)
Anesthesia Evaluation  Patient identified by MRN, date of birth, ID band Patient awake    Reviewed: Allergy & Precautions, NPO status , Patient's Chart, lab work & pertinent test results  Airway Mallampati: III  TM Distance: >3 FB     Dental  (+) Teeth Intact   Pulmonary Current Smoker,  breath sounds clear to auscultation        Cardiovascular hypertension, Pt. 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  Renal/GU      Musculoskeletal   Abdominal   Peds  Hematology   Anesthesia Other Findings   Reproductive/Obstetrics                             Anesthesia Physical Anesthesia Plan  ASA: III  Anesthesia Plan: MAC   Post-op Pain Management:    Induction: Intravenous  Airway Management Planned: Nasal Cannula  Additional Equipment:   Intra-op Plan:   Post-operative Plan:   Informed Consent: I have reviewed the patients History and Physical, chart, labs and discussed the procedure including the risks, benefits and alternatives for the proposed anesthesia with the patient or authorized representative who has indicated his/her understanding and acceptance.     Plan Discussed with:   Anesthesia Plan Comments:         Anesthesia Quick Evaluation

## 2014-08-12 ENCOUNTER — Encounter (HOSPITAL_COMMUNITY): Payer: Self-pay | Admitting: Ophthalmology

## 2014-08-12 DIAGNOSIS — M545 Low back pain, unspecified: Secondary | ICD-10-CM | POA: Insufficient documentation

## 2014-10-16 ENCOUNTER — Encounter (HOSPITAL_COMMUNITY): Payer: Self-pay | Admitting: Emergency Medicine

## 2014-10-16 ENCOUNTER — Emergency Department (HOSPITAL_COMMUNITY): Payer: Worker's Compensation

## 2014-10-16 ENCOUNTER — Emergency Department (HOSPITAL_COMMUNITY)
Admission: EM | Admit: 2014-10-16 | Discharge: 2014-10-16 | Disposition: A | Discharge status: Home / Self Care | Payer: Worker's Compensation | Attending: Emergency Medicine | Admitting: Emergency Medicine

## 2014-10-16 DIAGNOSIS — S62522B Displaced fracture of distal phalanx of left thumb, initial encounter for open fracture: Secondary | ICD-10-CM

## 2014-10-16 DIAGNOSIS — Z720 Tobacco use: Secondary | ICD-10-CM | POA: Diagnosis not present

## 2014-10-16 DIAGNOSIS — Z23 Encounter for immunization: Secondary | ICD-10-CM | POA: Diagnosis not present

## 2014-10-16 DIAGNOSIS — Z880 Allergy status to penicillin: Secondary | ICD-10-CM | POA: Diagnosis not present

## 2014-10-16 DIAGNOSIS — Z8739 Personal history of other diseases of the musculoskeletal system and connective tissue: Secondary | ICD-10-CM | POA: Insufficient documentation

## 2014-10-16 DIAGNOSIS — Y9389 Activity, other specified: Secondary | ICD-10-CM | POA: Insufficient documentation

## 2014-10-16 DIAGNOSIS — Z8579 Personal history of other malignant neoplasms of lymphoid, hematopoietic and related tissues: Secondary | ICD-10-CM | POA: Insufficient documentation

## 2014-10-16 DIAGNOSIS — Y998 Other external cause status: Secondary | ICD-10-CM | POA: Diagnosis not present

## 2014-10-16 DIAGNOSIS — Z8639 Personal history of other endocrine, nutritional and metabolic disease: Secondary | ICD-10-CM | POA: Insufficient documentation

## 2014-10-16 DIAGNOSIS — Y9289 Other specified places as the place of occurrence of the external cause: Secondary | ICD-10-CM | POA: Diagnosis not present

## 2014-10-16 DIAGNOSIS — I10 Essential (primary) hypertension: Secondary | ICD-10-CM | POA: Insufficient documentation

## 2014-10-16 DIAGNOSIS — S61112A Laceration without foreign body of left thumb with damage to nail, initial encounter: Secondary | ICD-10-CM

## 2014-10-16 DIAGNOSIS — S61012A Laceration without foreign body of left thumb without damage to nail, initial encounter: Secondary | ICD-10-CM | POA: Diagnosis not present

## 2014-10-16 DIAGNOSIS — S6992XA Unspecified injury of left wrist, hand and finger(s), initial encounter: Secondary | ICD-10-CM | POA: Diagnosis present

## 2014-10-16 DIAGNOSIS — S62522A Displaced fracture of distal phalanx of left thumb, initial encounter for closed fracture: Secondary | ICD-10-CM | POA: Insufficient documentation

## 2014-10-16 DIAGNOSIS — W231XXA Caught, crushed, jammed, or pinched between stationary objects, initial encounter: Secondary | ICD-10-CM | POA: Insufficient documentation

## 2014-10-16 DIAGNOSIS — Z79899 Other long term (current) drug therapy: Secondary | ICD-10-CM | POA: Diagnosis not present

## 2014-10-16 MED ORDER — CEPHALEXIN 500 MG PO CAPS
500.0000 mg | ORAL_CAPSULE | Freq: Once | ORAL | Status: AC
  Administered 2014-10-16: 500 mg via ORAL
  Filled 2014-10-16: qty 1

## 2014-10-16 MED ORDER — CEPHALEXIN 500 MG PO CAPS: 500.0000 mg | ORAL_CAPSULE | Freq: Three times a day (TID) | ORAL | Status: DC

## 2014-10-16 MED ORDER — OXYCODONE-ACETAMINOPHEN 5-325 MG PO TABS
1.0000 | ORAL_TABLET | Freq: Once | ORAL | Status: AC
  Administered 2014-10-16: 1 via ORAL
  Filled 2014-10-16: qty 1

## 2014-10-16 MED ORDER — TETANUS-DIPHTH-ACELL PERTUSSIS 5-2.5-18.5 LF-MCG/0.5 IM SUSP
0.5000 mL | Freq: Once | INTRAMUSCULAR | Status: AC
  Administered 2014-10-16: 0.5 mL via INTRAMUSCULAR
  Filled 2014-10-16: qty 0.5

## 2014-10-16 MED ORDER — LIDOCAINE HCL (PF) 2 % IJ SOLN
10.0000 mL | Freq: Once | INTRAMUSCULAR | Status: AC
  Administered 2014-10-16: 10 mL
  Filled 2014-10-16: qty 10

## 2014-10-16 NOTE — ED Notes (Signed)
Pt caught left thumb in machinery and had crushing injury.  Laceration just below nail

## 2014-10-16 NOTE — ED Notes (Signed)
Pt states understanding of care given and follow up instructions.  Ambulated from ED with significant other 

## 2014-10-16 NOTE — Discharge Instructions (Signed)
Wear the splint until told by the orthopedic doctor it is not needed.  Finger Fracture Fractures of fingers are breaks in the bones of the fingers. There are many types of fractures. There are different ways of treating these fractures. Your health care provider will discuss the best way to treat your fracture. CAUSES Traumatic injury is the main cause of broken fingers. These include:  Injuries while playing sports.  Workplace injuries.  Falls. RISK FACTORS Activities that can increase your risk of finger fractures include:  Sports.  Workplace activities that involve machinery.  A condition called osteoporosis, which can make your bones less dense and cause them to fracture more easily. SIGNS AND SYMPTOMS The main symptoms of a broken finger are pain and swelling within 15 minutes after the injury. Other symptoms include:  Bruising of your finger.  Stiffness of your finger.  Numbness of your finger.  Exposed bones (compound fracture) if the fracture is severe. DIAGNOSIS  The best way to diagnose a broken bone is with X-ray imaging. Additionally, your health care provider will use this X-ray image to evaluate the position of the broken finger bones.  TREATMENT  Finger fractures can be treated with:   Nonreduction--This means the bones are in place. The finger is splinted without changing the positions of the bone pieces. The splint is usually left on for about a week to 10 days. This will depend on your fracture and what your health care provider thinks.  Closed reduction--The bones are put back into position without using surgery. The finger is then splinted.  Open reduction and internal fixation--The fracture site is opened. Then the bone pieces are fixed into place with pins or some type of hardware. This is seldom required. It depends on the severity of the fracture. HOME CARE INSTRUCTIONS   Follow your health care provider's instructions regarding activities, exercises,  and physical therapy.  Only take over-the-counter or prescription medicines for pain, discomfort, or fever as directed by your health care provider. SEEK MEDICAL CARE IF: You have pain or swelling that limits the motion or use of your fingers. SEEK IMMEDIATE MEDICAL CARE IF:  Your finger becomes numb. MAKE SURE YOU:   Understand these instructions.  Will watch your condition.  Will get help right away if you are not doing well or get worse. Document Released: 05/07/2000 Document Revised: 11/13/2012 Document Reviewed: 09/04/2012 [facility] Patient Information 2015 [place], [place]. This information is not intended to replace advice given to you by your health care provider. Make sure you discuss any questions you have with your health care provider.  Laceration Care, Adult A laceration is a cut or lesion that goes through all layers of the skin and into the tissue just beneath the skin. TREATMENT  Some lacerations may not require closure. Some lacerations may not be able to be closed due to an increased risk of infection. It is important to see your caregiver as soon as possible after an injury to minimize the risk of infection and maximize the opportunity for successful closure. If closure is appropriate, pain medicines may be given, if needed. The wound will be cleaned to help prevent infection. Your caregiver will use stitches (sutures), staples, wound glue (adhesive), or skin adhesive strips to repair the laceration. These tools bring the skin edges together to allow for faster healing and a better cosmetic outcome. However, all wounds will heal with a scar. Once the wound has healed, scarring can be minimized by covering the wound with sunscreen during  the day for 1 full year. HOME CARE INSTRUCTIONS  For sutures or staples:  Keep the wound clean and dry.  If you were given a bandage (dressing), you should change it at least once a day. Also, change the dressing if it becomes wet or dirty,  or as directed by your caregiver.  Wash the wound with soap and water 2 times a day. Rinse the wound off with water to remove all soap. Pat the wound dry with a clean towel.  After cleaning, apply a thin layer of the antibiotic ointment as recommended by your caregiver. This will help prevent infection and keep the dressing from sticking.  You may shower as usual after the first 24 hours. Do not soak the wound in water until the sutures are removed.  Only take over-the-counter or prescription medicines for pain, discomfort, or fever as directed by your caregiver.  Get your sutures or staples removed as directed by your caregiver. For skin adhesive strips:  Keep the wound clean and dry.  Do not get the skin adhesive strips wet. You may bathe carefully, using caution to keep the wound dry.  If the wound gets wet, pat it dry with a clean towel.  Skin adhesive strips will fall off on their own. You may trim the strips as the wound heals. Do not remove skin adhesive strips that are still stuck to the wound. They will fall off in time. For wound adhesive:  You may briefly wet your wound in the shower or bath. Do not soak or scrub the wound. Do not swim. Avoid periods of heavy perspiration until the skin adhesive has fallen off on its own. After showering or bathing, gently pat the wound dry with a clean towel.  Do not apply liquid medicine, cream medicine, or ointment medicine to your wound while the skin adhesive is in place. This may loosen the film before your wound is healed.  If a dressing is placed over the wound, be careful not to apply tape directly over the skin adhesive. This may cause the adhesive to be pulled off before the wound is healed.  Avoid prolonged exposure to sunlight or tanning lamps while the skin adhesive is in place. Exposure to ultraviolet light in the first year will darken the scar.  The skin adhesive will usually remain in place for 5 to 10 days, then naturally  fall off the skin. Do not pick at the adhesive film. You may need a tetanus shot if:  You cannot remember when you had your last tetanus shot.  You have never had a tetanus shot. If you get a tetanus shot, your arm may swell, get red, and feel warm to the touch. This is common and not a problem. If you need a tetanus shot and you choose not to have one, there is a rare chance of getting tetanus. Sickness from tetanus can be serious. SEEK MEDICAL CARE IF:   You have redness, swelling, or increasing pain in the wound.  You see a red line that goes away from the wound.  You have yellowish-white fluid (pus) coming from the wound.  You have a fever.  You notice a bad smell coming from the wound or dressing.  Your wound breaks open before or after sutures have been removed.  You notice something coming out of the wound such as wood or glass.  Your wound is on your hand or foot and you cannot move a finger or toe. [place]  IF:   Your pain is not controlled with prescribed medicine.  You have severe swelling around the wound causing pain and numbness or a change in color in your arm, hand, leg, or foot.  Your wound splits open and starts bleeding.  You have worsening numbness, weakness, or loss of function of any joint around or beyond the wound.  You develop painful lumps near the wound or on the skin anywhere on your body. MAKE SURE YOU:   Understand these instructions.  Will watch your condition.  Will get help right away if you are not doing well or get worse. Document Released: 01/23/2005 Document Revised: 04/17/2011 Document Reviewed: 07/19/2010 [facility] Patient Information 2015 [place], [place]. This information is not intended to replace advice given to you by your health care provider. Make sure you discuss any questions you have with your health care provider.  Cephalexin tablets or capsules What is this medicine? CEPHALEXIN (sef a LEX in) is a  cephalosporin antibiotic. It is used to treat certain kinds of bacterial infections It will not work for colds, flu, or other viral infections. This medicine may be used for other purposes; ask your health care provider or pharmacist if you have questions. COMMON BRAND NAME(S): Biocef, Keflex, Keftab What should I tell my health care provider before I take this medicine? They need to know if you have any of these conditions: -kidney disease -stomach or intestine problems, especially colitis -an unusual or allergic reaction to cephalexin, other cephalosporins, penicillins, other antibiotics, medicines, foods, dyes or preservatives -pregnant or trying to get pregnant -breast-feeding How should I use this medicine? Take this medicine by mouth with a full glass of water. Follow the directions on the prescription label. This medicine can be taken with or without food. Take your medicine at regular intervals. Do not take your medicine more often than directed. Take all of your medicine as directed even if you think you are better. Do not skip doses or stop your medicine early. Talk to your pediatrician regarding the use of this medicine in children. While this drug may be prescribed for selected conditions, precautions do apply. Overdosage: If you think you have taken too much of this medicine contact a poison control center or emergency room at once. NOTE: This medicine is only for you. Do not share this medicine with others. What if I miss a dose? If you miss a dose, take it as soon as you can. If it is almost time for your next dose, take only that dose. Do not take double or extra doses. There should be at least 4 to 6 hours between doses. What may interact with this medicine? -probenecid -some other antibiotics This list may not describe all possible interactions. Give your health care provider a list of all the medicines, herbs, non-prescription drugs, or dietary supplements you use. Also tell  them if you smoke, drink alcohol, or use illegal drugs. Some items may interact with your medicine. What should I watch for while using this medicine? Tell your doctor or health care professional if your symptoms do not begin to improve in a few days. Do not treat diarrhea with over the counter products. Contact your doctor if you have diarrhea that lasts more than 2 days or if it is severe and watery. If you have diabetes, you may get a false-positive result for sugar in your urine. Check with your doctor or health care professional. What side effects may I notice from receiving this medicine? Side effects that  you should report to your doctor or health care professional as soon as possible: -allergic reactions like skin rash, itching or hives, swelling of the face, lips, or tongue -breathing problems -pain or trouble passing urine -redness, blistering, peeling or loosening of the skin, including inside the mouth -severe or watery diarrhea -unusually weak or tired -yellowing of the eyes, skin Side effects that usually do not require medical attention (report to your doctor or health care professional if they continue or are bothersome): -gas or heartburn -genital or anal irritation -headache -joint or muscle pain -nausea, vomiting This list may not describe all possible side effects. Call your doctor for medical advice about side effects. You may report side effects to FDA at 1-800-FDA-1088. Where should I keep my medicine? Keep out of the reach of children. Store at room temperature between 59 and 86 degrees F (15 and 30 degrees C). Throw away any unused medicine after the expiration date. NOTE: This sheet is a summary. It may not cover all possible information. If you have questions about this medicine, talk to your doctor, pharmacist, or health care provider.  2015, [employer]. (2007-04-29 17:09:13)  Td Vaccine (Tetanus and Diphtheria): What You Need to Know 1. Why get  vaccinated? Tetanus  and diphtheria are very serious diseases. They are rare in the [place] today, but people who do become infected often have severe complications. Td vaccine is used to protect adolescents and adults from both of these diseases. Both tetanus and diphtheria are infections caused by bacteria. Diphtheria spreads from person to person through coughing or sneezing. Tetanus-causing bacteria enter the body through cuts, scratches, or wounds. TETANUS (Lockjaw) causes painful muscle tightening and stiffness, usually all over the body.  It can lead to tightening of muscles in the head and neck so you can't open your mouth, swallow, or sometimes even breathe. Tetanus kills about 1 out of every 5 people who are infected. DIPHTHERIA can cause a thick coating to form in the back of the throat.  It can lead to breathing problems, paralysis, heart failure, and death. Before vaccines, the [place] saw as many as 200,000 cases a year of diphtheria and hundreds of cases of tetanus. Since vaccination began, cases of both diseases have dropped by about 99%. 2. Td vaccine Td vaccine can protect adolescents and adults from tetanus and diphtheria. Td is usually given as a booster dose every 10 years but it can also be given earlier after a severe and dirty wound or burn. Your doctor can give you more information. Td may safely be given at the same time as other vaccines. 3. Some people should not get this vaccine  If you ever had a life-threatening allergic reaction after a dose of any tetanus or diphtheria containing vaccine, OR if you have a severe allergy to any part of this vaccine, you should not get Td. Tell your doctor if you have any severe allergies.  Talk to your doctor if you:  have epilepsy or another nervous system problem,  had severe pain or swelling after any vaccine containing diphtheria or tetanus,  ever had Guillain Barr Syndrome (GBS),  aren't feeling well on the  day the shot is scheduled. 4. Risks of a vaccine reaction With a vaccine, like any medicine, there is a chance of side effects. These are usually mild and go away on their own. Serious side effects are also possible, but are very rare. Most people who get Td vaccine do not have any problems  with it. Mild Problems  following Td (Did not interfere with activities)  Pain where the shot was given (about 8 people in 10)  Redness or swelling where the shot was given (about 1 person in 3)  Mild fever (about 1 person in 15)  Headache or Tiredness (uncommon) Moderate Problems following Td (Interfered with activities, but did not require medical attention)  Fever over 102F (rare) Severe Problems  following Td (Unable to perform usual activities; required medical attention)  Swelling, severe pain, bleeding and/or redness in the arm where the shot was given (rare). Problems that could happen after any vaccine:  Brief fainting spells can happen after any medical procedure, including vaccination. Sitting or lying down for about 15 minutes can help prevent fainting, and injuries caused by a fall. Tell your doctor if you feel dizzy, or have vision changes or ringing in the ears.  Severe shoulder pain and reduced range of motion in the arm where a shot was given can happen, very rarely, after a vaccination.  Severe allergic reactions from a vaccine are very rare, estimated at less than 1 in a million doses. If one were to occur, it would usually be within a few minutes to a few hours after the vaccination. 5. What if there is a serious reaction? What should I look for?  Look for anything that concerns you, such as signs of a severe allergic reaction, very high fever, or behavior changes. Signs of a severe allergic reaction can include hives, swelling of the face and throat, difficulty breathing, a fast heartbeat, dizziness, and weakness. These would usually start a few minutes to a few hours after  the vaccination. What should I do?  If you think it is a severe allergic reaction or other emergency that can't wait, call 9-1-1 or get the person to the nearest hospital. Otherwise, call your doctor.  Afterward, the reaction should be reported to the Vaccine Adverse Event Reporting System (VAERS). Your doctor might file this report, or you can do it yourself through the VAERS web site at www.vaers.SamedayNews.es, or by calling 772 017 5305. VAERS is only for reporting reactions. They do not give medical advice. 6. The National Vaccine Injury Compensation Program The [employer] Vaccine Injury Compensation Program (VICP) is a federal program that was created to compensate people who may have been injured by certain vaccines. Persons who believe they may have been injured by a vaccine can learn about the program and about filing a claim by calling 865 488 9055 or visiting the [place] website at GoldCloset.com.ee. 7. How can I learn more?  Ask your doctor.  Contact your local or state health department.  Contact the Centers for Disease Control and Prevention (CDC):  Call 657 546 5254 (1-800-CDC-INFO)  Visit CDC's website at http://hunter.com/ CDC Td Vaccine Interim VIS (03/12/12) Document Released: 11/20/2005 Document Revised: 06/09/2013 Document Reviewed: 05/07/2013 [facility] Patient Information 2015 [place], [place]. This information is not intended to replace advice given to you by your health care provider. Make sure you discuss any questions you have with your health care provider.

## 2014-10-16 NOTE — ED Provider Notes (Signed)
CSN: [id number]     Arrival date & time 10/16/14  1165 History   First MD Initiated Contact with Patient 10/16/14 0330     Chief Complaint  Patient presents with  . Laceration     (Consider location/radiation/quality/duration/timing/severity/associated sxs/prior Treatment) Patient is a 59 y.o. male presenting with skin laceration. The history is provided by the patient.  Laceration He injured his left thumb and got caught in some machinery. There is also injury to the nail. He does not known his last serum immunization was. He denies other injury.  Past Medical History  Diagnosis Date  . HTN (hypertension)   . Hypercholesterolemia   . Sciatic nerve pain   . Multiple myeloma   . Back pain    Past Surgical History  Procedure Laterality Date  . Inguinal hernia repair bilateral      as child   . Tonsillectomy    . Knee surgery    . Colonoscopy  09/12/2010    Procedure: COLONOSCOPY;  Surgeon: Daneil Dolin, MD;  Location: [facility];  Service: Endoscopy;  Laterality: N/A;  . Bone marrow transplant    . Cataract extraction w/phaco Right 08/11/2014    Procedure: CATARACT EXTRACTION PHACO AND INTRAOCULAR LENS PLACEMENT (IOC);  Surgeon: Williams Che, MD;  Location: [facility];  Service: Ophthalmology;  Laterality: Right;  CDE 4.88   Family History  Problem Relation Age of Onset  . Colon polyps Father     passed away, [place]  . Colon polyps Sister   . Migraines Mother     deceased   Social History  Substance Use Topics  . Smoking status: Current Every Day Smoker -- 1.00 packs/day for 40 years    Types: Cigarettes  . Smokeless tobacco: None  . Alcohol Use: No    Review of Systems  All other systems reviewed and are negative.     Allergies  Penicillins and Ceclor  Home Medications   Prior to Admission medications   Medication Sig Start Date End Date Taking? Authorizing Provider  acyclovir (ZOVIRAX) 200 MG capsule Take 800 mg by mouth 2 (two) times daily.  05/06/14   Yes Historical Provider, MD  amLODipine (NORVASC) 10 MG tablet Take 10 mg by mouth daily.  05/25/10  Yes Historical Provider, MD  bortezomib IV (VELCADE) 3.5 MG injection Inject 1.3 mg/m2 into the vein every 14 (fourteen) days.   Yes Historical Provider, MD  calcium carbonate (OS-CAL) 600 MG TABS tablet Take 600 mg by mouth 2 (two) times daily with a meal.   Yes Historical Provider, MD  fentaNYL (DURAGESIC - DOSED MCG/HR) 75 MCG/HR Place 1 patch onto the skin every 3 (three) days. Last patch 07/27/14 07/13/14  Yes Historical Provider, MD  fluticasone (FLONASE) 50 MCG/ACT nasal spray Place 2 sprays into both nostrils daily as needed for allergies.    Yes Historical Provider, MD  KLOR-CON M20 20 MEQ tablet Take 1 tablet by mouth 2 (two) times daily. 06/19/14  Yes Historical Provider, MD  naproxen sodium (ANAPROX) 220 MG tablet Take 220 mg by mouth 2 (two) times daily with a meal.   Yes Historical Provider, MD  oxyCODONE (OXY IR/ROXICODONE) 5 MG immediate release tablet Take 1 tablet by mouth every 4 (four) hours as needed. pain 06/19/14  Yes Historical Provider, MD  polyethylene glycol (MIRALAX / GLYCOLAX) packet Take 17 g by mouth daily as needed for mild constipation.   Yes Historical Provider, MD  tamsulosin (FLOMAX) 0.4 MG CAPS capsule Take 1 capsule  by mouth 2 (two) times daily. 07/05/14  Yes Historical Provider, MD  acetaminophen (TYLENOL) 325 MG tablet Take 650 mg by mouth every 6 (six) hours as needed for mild pain.     Historical Provider, MD  cephALEXin (KEFLEX) 500 MG capsule Take 1 capsule (500 mg total) by mouth 3 (three) times daily. 09/10/51   Delora Fuel, MD   BP 646/80 mmHg  Pulse 61  Temp(Src) 97.8 F (36.6 C) (Oral)  Resp 18  Ht $R'5\' 11"'pH$  (1.803 m)  Wt 206 lb (93.441 kg)  BMI 28.74 kg/m2  SpO2 99% Physical Exam  Nursing note and vitals reviewed.  59 year old male, resting comfortably and in no acute distress. Vital signs are significant for borderline hypertension. Oxygen saturation  is 99%, which is normal. Head is normocephalic and atraumatic. PERRLA, EOMI. Oropharynx is clear. Neck is nontender and supple without adenopathy or JVD. Back is nontender and there is no CVA tenderness. Lungs are clear without rales, wheezes, or rhonchi. Chest is nontender. Heart has regular rate and rhythm without murmur. Abdomen is soft, flat, nontender without masses or hepatosplenomegaly and peristalsis is normoactive. Extremities: Laceration is present across the dorsum of the distal phalanx of the left thumb with avulsion of the nail base. Distal neurovascular exam is intact with normal sensation and prompt capillary refill.Skin is warm and dry without rash. Neurologic: Mental status is normal, cranial nerves are intact, there are no motor or sensory deficits.  ED Course  Procedures (including critical care time) LACERATION REPAIR Performed by: HOZYY,[email or web address] Authorized by: IBBCW,UGQBV Consent: Verbal consent obtained. Risks and benefits: risks, benefits and alternatives were discussed Consent given by: patient Patient identity confirmed: provided demographic data Prepped and Draped in normal sterile fashion Wound explored  Laceration Location: Left thumb  Laceration Length: 2.5 cm  No Foreign Bodies seen or palpated  Anesthesia: Digital block   Local anesthetic: lidocaine 2% without epinephrine  Anesthetic total: 8 ml  Amount of cleaning: standard  Skin closure: Close   Number of sutures: 4 skin sutures with 4-0 nylon, and to sutures with 4-0 nylon going through the nail to hold it in position.   Technique: Simple interrupted. Prior to closure, base of nail was replaced in the nailbed.   Patient tolerance: Patient tolerated the procedure well with no immediate complications.   Imaging Review Dg Finger Thumb Left  10/16/2014   CLINICAL DATA:  Crushing injury to the left thumb. Laceration below the nail.  EXAM: LEFT THUMB 2+V  COMPARISON:  None.  FINDINGS: Comminuted  crush type fracture of the distal phalangeal tuft of the left first finger with overlying soft tissue defect. Extension to the nail bed suggests an open fracture. Proximal phalanx is intact.  IMPRESSION: Comminuted crush type fracture of the distal phalangeal tuft of the left first finger with soft tissue defect suggesting open fracture.   Electronically Signed   By: Lucienne Capers M.D.   On: 10/16/2014 04:26   I have personally reviewed and evaluated these images results as part of my medical decision-making.  MDM   Final diagnoses:  Laceration of thumb without foreign body with damage to nail, left, initial encounter  Open fracture of tuft of distal phalanx of left thumb, initial encounter    Laceration of the left thumb involving the nail and nailbed. Digital block was placed with 2% lidocaine and the patient was sent for x-rays showing tuft fracture. This will need to be treated as an open fracture. Laceration is closed and nail  base was repositioned and anchored with sutures. He is placed in a splint and is referred to orthopedics for follow-up. Is given a prescription for cephalexin for pain. He has chronic pain and is on a fentanyl patch and has oxycodone at home, so no additional analgesic prescriptions are given.    Delora Fuel, MD 93/90/30 0923

## 2014-10-19 NOTE — Patient Instructions (Signed)
[facility]  10/19/2014     @PREFPERIOPPHARMACY @   Your procedure is scheduled on 10/26/2014  Report to [facility] at 6:30 A.M.  Call this number if you have problems the morning of surgery:  [phone]   Remember:  Do not eat food or drink liquids after midnight.  Take these medicines the morning of surgery with A SIP OF WATER Amlodipine, Fentanyl Patch, Flonase, Fomax, Oxycodone if needed   Do not wear jewelry, make-up or nail polish.  Do not wear lotions, powders, or perfumes.  You may wear deodorant.  Do not shave 48 hours prior to surgery.  Men may shave face and neck.  Do not bring valuables to the hospital.  [name] is not responsible for any belongings or valuables.  Contacts, dentures or bridgework may not be worn into surgery.  Leave your suitcase in the car.  After surgery it may be brought to your room.  For patients admitted to the hospital, discharge time will be determined by your treatment team.  Patients discharged the day of surgery will not be allowed to drive home.    Please read over the following fact sheets that you were given. Anesthesia Post-op Instructions     PATIENT INSTRUCTIONS POST-ANESTHESIA  IMMEDIATELY FOLLOWING SURGERY:  Do not drive or operate machinery for the first twenty four hours after surgery.  Do not make any important decisions for twenty four hours after surgery or while taking narcotic pain medications or sedatives.  If you develop intractable nausea and vomiting or a severe headache please notify your doctor immediately.  FOLLOW-UP:  Please make an appointment with your surgeon as instructed. You do not need to follow up with anesthesia unless specifically instructed to do so.  WOUND CARE INSTRUCTIONS (if applicable):  Keep a dry clean dressing on the anesthesia/puncture wound site if there is drainage.  Once the wound has quit draining you may leave it open to air.  Generally you should leave the bandage intact for  twenty four hours unless there is drainage.  If the epidural site drains for more than 36-48 hours please call the anesthesia department.  QUESTIONS?:  Please feel free to call your physician or the hospital operator if you have any questions, and they will be happy to assist you.      Cataract Surgery  A cataract is a clouding of the lens of the eye. When a lens becomes cloudy, vision is reduced based on the degree and nature of the clouding. Surgery may be needed to improve vision. Surgery removes the cloudy lens and usually replaces it with a substitute lens (intraocular lens, IOL). LET YOUR EYE DOCTOR KNOW ABOUT:  Allergies to food or medicine.  Medicines taken including herbs, eye drops, over-the-counter medicines, and creams.  Use of steroids (by mouth or creams).  Previous problems with anesthetics or numbing medicine.  History of bleeding problems or blood clots.  Previous surgery.  Other health problems, including diabetes and kidney problems.  Possibility of pregnancy, if this applies. RISKS AND COMPLICATIONS  Infection.  Inflammation of the eyeball (endophthalmitis) that can spread to both eyes (sympathetic ophthalmia).  Poor wound healing.  If an IOL is inserted, it can later fall out of proper position. This is very uncommon.  Clouding of the part of your eye that holds an IOL in place. This is called an "after-cataract." These are uncommon but easily treated. BEFORE THE PROCEDURE  Do not eat or drink anything except small amounts of  water for 8 to 12 before your surgery, or as directed by your caregiver.  Unless you are told otherwise, continue any eye drops you have been prescribed.  Talk to your primary caregiver about all other medicines that you take (both prescription and nonprescription). In some cases, you may need to stop or change medicines near the time of your surgery. This is most important if you are taking blood-thinning medicine.Do not stop  medicines unless you are told to do so.  Arrange for someone to drive you to and from the procedure.  Do not put contact lenses in either eye on the day of your surgery. PROCEDURE There is more than one method for safely removing a cataract. Your doctor can explain the differences and help determine which is best for you. Phacoemulsification surgery is the most common form of cataract surgery.  An injection is given behind the eye or eye drops are given to make this a painless procedure.  A small cut (incision) is made on the edge of the clear, dome-shaped surface that covers the front of the eye (cornea).  A tiny probe is painlessly inserted into the eye. This device gives off ultrasound waves that soften and break up the cloudy center of the lens. This makes it easier for the cloudy lens to be removed by suction.  An IOL may be implanted.  The normal lens of the eye is covered by a clear capsule. Part of that capsule is intentionally left in the eye to support the IOL.  Your surgeon may or may not use stitches to close the incision. There are other forms of cataract surgery that require a larger incision and stitches to close the eye. This approach is taken in cases where the doctor feels that the cataract cannot be easily removed using phacoemulsification. AFTER THE PROCEDURE  When an IOL is implanted, it does not need care. It becomes a permanent part of your eye and cannot be seen or felt.  Your doctor will schedule follow-up exams to check on your progress.  Review your other medicines with your doctor to see which can be resumed after surgery.  Use eye drops or take medicine as prescribed by your doctor. Document Released: 01/12/2011 Document Revised: 06/09/2013 Document Reviewed: 01/12/2011 [facility] Patient Information 2015 [place], [place]. This information is not intended to replace advice given to you by your health care provider. Make sure you discuss any questions you have  with your health care provider.

## 2014-10-20 ENCOUNTER — Encounter (HOSPITAL_COMMUNITY): Payer: Self-pay

## 2014-10-20 ENCOUNTER — Encounter (HOSPITAL_COMMUNITY)
Admission: RE | Admit: 2014-10-20 | Discharge: 2014-10-20 | Disposition: A | Discharge status: Home / Self Care | Payer: Managed Care, Other (non HMO) | Source: Ambulatory Visit | Attending: Ophthalmology | Admitting: Ophthalmology

## 2014-10-20 DIAGNOSIS — H268 Other specified cataract: Secondary | ICD-10-CM | POA: Insufficient documentation

## 2014-10-20 DIAGNOSIS — Z01818 Encounter for other preprocedural examination: Secondary | ICD-10-CM | POA: Insufficient documentation

## 2014-10-20 LAB — CBC
HCT: 38.9 % — ABNORMAL LOW (ref 39.0–52.0)
Hemoglobin: 14.1 g/dL (ref 13.0–17.0)
MCH: 34.8 pg — ABNORMAL HIGH (ref 26.0–34.0)
MCHC: 36.2 g/dL — ABNORMAL HIGH (ref 30.0–36.0)
MCV: 96 fL (ref 78.0–100.0)
Platelets: 155 K/uL (ref 150–400)
RBC: 4.05 MIL/uL — ABNORMAL LOW (ref 4.22–5.81)
RDW: 13.3 % (ref 11.5–15.5)
WBC: 8.3 K/uL (ref 4.0–10.5)

## 2014-10-20 LAB — BASIC METABOLIC PANEL
ANION GAP: 8 (ref 5–15)
BUN: 19 mg/dL (ref 6–20)
CALCIUM: 9.1 mg/dL (ref 8.9–10.3)
CO2: 27 mmol/L (ref 22–32)
Chloride: 104 mmol/L (ref 101–111)
Creatinine, Ser: 0.81 mg/dL (ref 0.61–1.24)
GLUCOSE: 113 mg/dL — AB (ref 65–99)
POTASSIUM: 4.1 mmol/L (ref 3.5–5.1)
SODIUM: 139 mmol/L (ref 135–145)

## 2014-10-20 NOTE — Pre-Procedure Instructions (Signed)
Patient given information to sign up for my chart at home. 

## 2014-10-23 MED ORDER — TETRACAINE HCL 0.5 % OP SOLN
OPHTHALMIC | Status: AC
  Filled 2014-10-23: qty 2

## 2014-10-23 MED ORDER — LIDOCAINE HCL 3.5 % OP GEL
OPHTHALMIC | Status: AC
  Filled 2014-10-23: qty 1

## 2014-10-23 MED ORDER — CYCLOPENTOLATE-PHENYLEPHRINE OP SOLN OPTIME - NO CHARGE
OPHTHALMIC | Status: AC
Start: 2014-10-23 — End: 2014-10-23
  Filled 2014-10-23: qty 2

## 2014-10-25 NOTE — H&P (Signed)
Surgeon's preoperative summary of results and expectations for patient: Craig Rivera    Activity limitations caused by visual disabilty related to cataract(s). Pt states vision good OD, no eye pain c/o vision is smeared and cloudy OS, makes it difficult to read   Best corrected visual acuity:   Right Eye Left Eye  Distance Acuity 20  50   Near Acuity 1+  2    I certify that my patient's record contains the following information:  The record contains digital ultrasonic biometry for the purpose of determining the correct implant power for the operative eye.  The patients' impairment of visual function is believed not to be correctable with a tolerable change in glasses or contact lenses.  Cataract (in the operative eye) is believed to be significantly contributing to the patient's visual impairment.  The patient desires surgical correction;  the risks, benefits, and alternatives have been expalined; and a reasonable expectation exists that lens surgery will significantly improve both the visual and functional status of the patient.  I certify the statements on this sheet are true to the best of my knowledge.   Patient's statement of visual disability OS:  Has indicated that he/she has difficulty in the following tasks associated with daily living: difficulty reading small print on books, newspapers, labels, etc. difficulty watching television due to glare or inability to read captions difficulty driving due to inability to read traffic signals or street signs difficulty driving at night due to glare, halos, or rings Blurry or hazy vision Light sensitivity or experiencing glare on sunny days Decreased vision at night difficulty recognizing people from a distance difficulty writing checks, letters, or filling out forms difficulty carrying out daily routines inability to participate in recreational activities  *  Lala Lund, MD 10/20/14 08:53:45 Digitally signed to  expedite the transference of documentation.            [place], [place] Lala Lund., M.D. Surgical Request and Orders  Procedure Date:    10/26/2014     Patient:  Craig Rivera, Craig Rivera    SSN:     DOB:  29-Aug-1955   Sex:  male  Address:  [address],  [place],     [address]  Home Phone:  [phone]  Work:   cell:     Primary Insurance:  Christella Scheuermann ID#:  [id number]  Grp#:  [id number] Precert required?  No Pre-cert #:        Procedure:  Phacoemulsification with lens implant OS (LEFT EYE)  Preoperative Diagnosis/symptoms:  surgical cataract of left eye Reason for Surgery:  The patient complains of difficulty performing daily activities, including but not limited to reading and drivingplease see chief complaint for details. Special instructions/orders:  Please add 4.0 mL Omidria (phenylephrine 1% / ketorolac 0.3%) to 500 mL BSS irrigation solution  *  Lala Lund, MD 10/20/14 08:54:18 Digitally signed to expedite the transference of documentation.         [facility] PHYSCIAN ORDERS:  ANESTHESIA:  Topical/Monitored Anesthesia Care   ALLERGIES:    NKA   HOSPITAL DISPOSITION:  Short Stay Outpatient   LABORATORY:  Per Anesthesia request only. Please call Dr. Iona Hansen' office for panic values  [phone] or [phone]).   X-RAY:  Per Anesthesia request only.  HEART STATION:  EKG  Per anesthesia request only.  PHARMACY: Purchased at retail pharmacy:   1)   Gatifloxacin 0.5% Eye drops, 1 drop OU qid, begin 3 days prior  to surgery, repeat in a.m. prior to leaving for the hospital, and to operative eye only qid, begin 3 hours post op.  Moxifloxacin 0.5% may be substituted for gatifloxacin using the same instructions.    2)   Ketorolac 0.5% Eye drops, 1 drop to each eye qid, begin one day prior to surgery and before leaving for the hospital, and to operative eye only qid, begin 3 hours post op. 3)  prednisolone acetate 1% eye drops, 1  drop qid to left eye, begin 3 hours post op.      PRE OP ORDERS:   1)   Instil 1 drop of cyclomydril * Oph to left eye x 3 on arrival to Short Stay Unit. * NOTE  cyclopentylate 1% and phenylephrine .25% or homatropine 5% and phenylephrine .25% ophthalmic drops may be substitued for cyclomydril, one drop of each q 5 minutes x 3 doses to left eye on arrival to [facility]                          2)   Instill 1 drop tetracaine 0.5% Oph to left eye q 5 minutes x 3 on arrival to short stay and onve on arrival to OR. 3)   Instill 1 drop Betadine 5% eye drops to left eye, following tetracaine drops and immediately upon arrival in OR. 4)    Instill 2 drops lidocaine Oph gel 3.5% to left eye in OR before pre-op prep.    SPECIAL INSTRUCTIONS/ORDERS:  Please add 4.0 mL Omidria (phenylephrine 1% / ketorolac 0.3%) to 500 mL BSS irrigation solution     POST OP SHORT STAY DISCHARGE ORDERS/PATIENT INSTRUCTIONS:  1)  Resume Diet/previous med orders per med reconciliation form:   2)  F/U appt at Dr Iona Hansen Office in [place]:  10/27/14@7 :50AM   3)   D/C from Short Stay per protocol. 4)   Begin Pred Forte (prednisolone acetate 1%), Ketorolac 0.5% and gatifloxacin 0.5% eye drops;  1 drop each 4 times daily to operative eye, begin 3 hrs. post D/C from Short Stay Unit.  Moxifloxacin 0.5% may be substituted for gatifloxcin using the same instructions. 5)   Page Dr. Iona Hansen via beeper [phone]) if significant pain in or around operative eye. 6)   If  patient on Plavix pre-op, restart at usual dose on evening of surgery. 7)   Wear dark glasses as necessary for excessive light sensitivity post op. 8)   Instruct patient not to forcefully rub operative LEFT eye. 9)   Keep eye dry for 1 week.  Patient may gently wipe lids with damp wash cloth. 10)  Patient may resume normal occupational activities in one week and resume driving as tolerated   after the first post operative visit. 11)   It is normal to have blurred vision  and a scratchy sensation following surgery.  Lala Lund, MD 10/20/14 08:54:39 Digitally signed to expedite the transference of documentation.    I have received a copy of "Dr. Iona Hansen Post Operative Instructions for Cataract Patients" and will abide by them. Patient signature:  Witness signature:                                                 Date/Time:      [facility] [address] [place], [place]  [place]   Dr. Iona Hansen Post Operative Instructions For Cataract Patients  These instructions are for Craig Rivera  and pertain to the operative LEFT EYE.  1)  Resume your Diet and previous medicines as you normally use them.   2)  Your Follow up appointment is at Dr. Iona Hansen' Office in [place] on 10/27/14@7 :50AM    3)   You may leave the hospital when your driver is present and your nurse releases you. 4)  Begin Pred Forte (prednisolone acetate 1%), Ketorolac 0.5% and Zymaxid (gatifloxacin 0.5%) eye drops;  1 drop each 4 times daily to operative eye, begin 3 hrs. Following discharge from Short Stay Unit.  Vigamox (moxifloxacin 0.5%) may be substituted for Zymar using the same instructions. 5)  Page Dr. Iona Hansen via cell phone [phone]) for significant pain in or around operative eye. 6)   If you took Plavix before surgery, restart it at the usual dose on the evening of surgery. 7)   Wear dark glasses as necessary for excessive light sensitivity. 8)   Do not to forcefully rub your left eye. 9)   Keep your left eye dry for 1 week.  You may gently clean your eyelids with a damp wash cloth. 10) You may resume normal occupational activities in one week and resume driving as tolerated after the first post operative visit. 11)  It is normal to have blurred vision and a scratchy sensation following surgery.         Pre Surgical History and Physical Exam  Patient:  Craig Rivera     Date:      10/20/2014 8:59:13 AM  Chief Complaint Pt states vision good OD, no  eye pain c/o vision is smeared and cloudy OS, makes it difficult to read  +HPI:   Location OD  Quality Pt states vision good OD, no eye pain c/o vision is smeared and cloudy OS, makes it difficult to read  Severity moderate  Duration x 6 wks  Timing constant  Context s/p KPE OD with IOL 08/11/14, cat OS  Modifying Factors Pred BID OD, Ketorolac BID OD  Medical History:              Pseudophakia OD Cataract, PSC OS Cataract, Nuclear OS Blurred vision Presbyopia Astigmatism Myopia Family Hx:                      non contributary  Social Hx:                        Allergy:                          NKA Med List:                        Flomax, Amlodipine, Klor Con , Acyclovir, Aleve, Oxycodone, Fentanyl, Cephalexin  Notes:                               Review of Systems:   Pt admits to: and   excessive fatigue  The patient admits to: and hearing loss   The patient admits to: and Hypertension  The patient admits to: and Arthritis  The patient admits [id number] Neuropathy   Physical Exam: General:  The patient is a well nourished, well developed White male.  Systolic: Diastolic: Pulse:  195  41 [place]:          Normocephalic, normally pigmented, atraumatic  Neck:              without mass, midline structural deviation or malposition      Chest:             clear to auscultation bilaterally Breast:            Deferred  Heart:             The heart sounds are normal.  There is no murmur or gallop present  Abdomen:       Normal bowel sounds present  Pelvic:            Deferred GU:                 Deferred Extremities:    symmetric without pretibial edema Skin:               Deferred  Neurological:  Pt oriented, cranial nerves grossly normal   Eye Exam:   Best corrected Vision Right:  20/20      Left:  20/50  Spectacle Rx Right: -0.25-0.50x120  Spectacle Rx Left:   -3.50-0.50x029   Eye Exam                LEFT   Lids:                       Lids function normally and  are symmetrically, and normally positioned with clean/quiet margins OU.   Conjunctiva:           White and quiet  Cornea:                  Clear, with adequate cell count  Anterior Chamber:  deep and quiet  Iris:                        The iris is uniformly colored with a round symmetric pupil.  Lens:                      4+ nuclear sclerosis  Anterior Vitreous:   Clear and quiet Tonometry, right:   17    left:  14    Fundus, disc:           The discs are  normal, healthy appearing OU.  Fundus, macula:      The background, macula, and vessels are normal in appearance.  Dr. Iona Hansen Notes:   Please add 4.0 mL Omidria (phenylephrine 1% / ketorolac 0.3%) to 500 mL BSS irrigation solution no items of interest  Ultrasonic Digital Biometry OS:  23.75                                             OS  PC Lens (primary lens)   Alcon [id number]  Lens Power  +22.00  Calc. Ametropia  -1.00       AC Lens (Alt. lens)   Alcon [id number]   Lens Power   +19.00        Diagnosis:     Surgical Cataract LEFThas been specifically requested by the patient with reasonable eexpectation of achieving stated visual/functional goals.  Alternative treatments, including optical correction have been discussed with the patient as well as  the mitigating effects of ocular co-morbidities on final vision.   Patient's statement of visual disability OS:  Has indicated that he/she has difficulty in the following tasks associated with daily living: difficulty reading small print on books, newspapers, labels, etc. difficulty watching television due to glare or inability to read captions difficulty driving due to inability to read traffic signals or street signs difficulty driving at night due to glare, halos, or rings Blurry or hazy vision Light sensitivity or experiencing glare on sunny days Decreased vision at night difficulty recognizing people from a distance difficulty writing checks, letters, or filling out forms difficulty  carrying out daily routines inability to participate in recreational activities     Surgical Plan:   Phacoemulsification with lens implant OS (LEFT EYE)   Post Op Appt:  10/27/14@7 :50AM  in [employer].  Lala Lund, MD 10/20/14 54:65:03 Digitally signed to expedite the transference of documentation.    [facility] EYE DROP INSTRUCTIONS BEFORE SURGERY THESE MEDICATIONS MUST BE PURCHASED AT YOUR PHARMACY BEFORE YOUR PREOP OFFICE EVALUATION  GATIFLOXACIN  0.5% EYE DROPS OR MOXIFLOXACIN 0.5% EYE DROPS:  USE ONE DROP TO BOTH EYES 4 TIMES DAILY BEGINNING ON THE FRIDAY MORNING BEFORE SURGERY AND ONCE TO BOTH EYES BEFORE LEAVING FOR SURGERY MONDAY MORNING.  KETOROLAC 0.5% EYE DROPS:  USE ONE DROP 4 TIMES DAILY TO BOTH EYES BEGINNING ON THE SUNDAY MORNING BEFORE SURGERY AND AGAIN ON MONDAY MORNING BEFORE LEAVING FOR SURGERY.  PREDNISOLONE ACETATE 1% EYE DROPS:  DO NOT USE THESE BEFORE SURGERY  PLEASE TAKE YOUR DROPS WITH YOU TO [place] AND MAKE SURE THEY ARE RETURNED TO YOU BEFORE YOU LEAVE.  YOU WILL USE ALL OF THEM AGAIN AFTER YOUR SURGERY.  [facility] STAFF

## 2014-10-26 ENCOUNTER — Ambulatory Visit (HOSPITAL_COMMUNITY): Payer: Managed Care, Other (non HMO) | Admitting: Anesthesiology

## 2014-10-26 ENCOUNTER — Encounter (HOSPITAL_COMMUNITY): Payer: Self-pay

## 2014-10-26 ENCOUNTER — Encounter (HOSPITAL_COMMUNITY)
Admission: RE | Disposition: A | Discharge status: Home / Self Care | Payer: Self-pay | Source: Ambulatory Visit | Attending: Ophthalmology

## 2014-10-26 ENCOUNTER — Ambulatory Visit (HOSPITAL_COMMUNITY)
Admission: RE | Admit: 2014-10-26 | Discharge: 2014-10-26 | Disposition: A | Discharge status: Home / Self Care | Payer: Managed Care, Other (non HMO) | Source: Ambulatory Visit | Attending: Ophthalmology | Admitting: Ophthalmology

## 2014-10-26 DIAGNOSIS — Z79899 Other long term (current) drug therapy: Secondary | ICD-10-CM | POA: Insufficient documentation

## 2014-10-26 DIAGNOSIS — H269 Unspecified cataract: Secondary | ICD-10-CM | POA: Insufficient documentation

## 2014-10-26 DIAGNOSIS — F17200 Nicotine dependence, unspecified, uncomplicated: Secondary | ICD-10-CM | POA: Insufficient documentation

## 2014-10-26 DIAGNOSIS — I10 Essential (primary) hypertension: Secondary | ICD-10-CM | POA: Diagnosis not present

## 2014-10-26 DIAGNOSIS — C9000 Multiple myeloma not having achieved remission: Secondary | ICD-10-CM | POA: Diagnosis not present

## 2014-10-26 DIAGNOSIS — Z9481 Bone marrow transplant status: Secondary | ICD-10-CM | POA: Insufficient documentation

## 2014-10-26 HISTORY — PX: CATARACT EXTRACTION W/PHACO: SHX586

## 2014-10-26 SURGERY — PHACOEMULSIFICATION, CATARACT, WITH IOL INSERTION
Anesthesia: Monitor Anesthesia Care | Laterality: Left

## 2014-10-26 MED ORDER — CYCLOPENTOLATE-PHENYLEPHRINE 0.2-1 % OP SOLN
1.0000 [drp] | OPHTHALMIC | Status: AC
  Administered 2014-10-26 (×3): 1 [drp] via OPHTHALMIC

## 2014-10-26 MED ORDER — GLYCOPYRROLATE 0.2 MG/ML IJ SOLN
INTRAMUSCULAR | Status: AC
  Filled 2014-10-26: qty 1

## 2014-10-26 MED ORDER — LIDOCAINE HCL 3.5 % OP GEL
OPHTHALMIC | Status: DC | PRN
  Administered 2014-10-26: 1 via OPHTHALMIC

## 2014-10-26 MED ORDER — FENTANYL CITRATE (PF) 100 MCG/2ML IJ SOLN
INTRAMUSCULAR | Status: AC
  Filled 2014-10-26: qty 2

## 2014-10-26 MED ORDER — POVIDONE-IODINE 5 % OP SOLN
OPHTHALMIC | Status: DC | PRN
  Administered 2014-10-26: 1 via OPHTHALMIC

## 2014-10-26 MED ORDER — PHENYLEPHRINE-KETOROLAC 1-0.3 % IO SOLN
INTRAOCULAR | Status: DC | PRN
  Administered 2014-10-26: 500 mL via OPHTHALMIC

## 2014-10-26 MED ORDER — MIDAZOLAM HCL 2 MG/2ML IJ SOLN
INTRAMUSCULAR | Status: AC
  Filled 2014-10-26: qty 2

## 2014-10-26 MED ORDER — MIDAZOLAM HCL 2 MG/2ML IJ SOLN
1.0000 mg | INTRAMUSCULAR | Status: DC | PRN
  Administered 2014-10-26: 2 mg via INTRAVENOUS

## 2014-10-26 MED ORDER — GLYCOPYRROLATE 0.2 MG/ML IJ SOLN
INTRAMUSCULAR | Status: DC | PRN
  Administered 2014-10-26 (×2): 0.2 mg via INTRAVENOUS

## 2014-10-26 MED ORDER — LIDOCAINE HCL 3.5 % OP GEL
1.0000 "application " | Freq: Once | OPHTHALMIC | Status: AC
  Administered 2014-10-26: 1 via OPHTHALMIC

## 2014-10-26 MED ORDER — TETRACAINE 0.5 % OP SOLN OPTIME - NO CHARGE
OPHTHALMIC | Status: DC | PRN
Start: 2014-10-26 — End: 2014-10-26
  Administered 2014-10-26: 2 [drp] via OPHTHALMIC

## 2014-10-26 MED ORDER — TETRACAINE HCL 0.5 % OP SOLN
1.0000 [drp] | OPHTHALMIC | Status: AC
  Administered 2014-10-26 (×3): 1 [drp] via OPHTHALMIC

## 2014-10-26 MED ORDER — PHENYLEPHRINE-KETOROLAC 1-0.3 % IO SOLN
INTRAOCULAR | Status: AC
  Filled 2014-10-26: qty 4

## 2014-10-26 MED ORDER — LACTATED RINGERS IV SOLN
INTRAVENOUS | Status: DC
  Administered 2014-10-26: 1000 mL via INTRAVENOUS

## 2014-10-26 MED ORDER — NA HYALUR & NA CHOND-NA HYALUR 0.55-0.5 ML IO KIT
PACK | INTRAOCULAR | Status: DC | PRN
  Administered 2014-10-26: 1 via OPHTHALMIC

## 2014-10-26 MED ORDER — BSS IO SOLN
INTRAOCULAR | Status: DC | PRN
  Administered 2014-10-26: 15 mL

## 2014-10-26 MED ORDER — FENTANYL CITRATE (PF) 100 MCG/2ML IJ SOLN
25.0000 ug | INTRAMUSCULAR | Status: AC
  Administered 2014-10-26 (×2): 25 ug via INTRAVENOUS

## 2014-10-26 SURGICAL SUPPLY — 7 items
CLOTH BEACON ORANGE TIMEOUT ST (SAFETY) ×2 IMPLANT
GLOVE BIOGEL PI IND STRL 7.0 (GLOVE) IMPLANT
GLOVE BIOGEL PI INDICATOR 7.0 (GLOVE) ×4
INST SET CATARACT ~~LOC~~ (KITS) ×3 IMPLANT
LENS ALC ACRYL/TECN (Ophthalmic Related) ×3 IMPLANT
PAD ARMBOARD 7.5X6 YLW CONV (MISCELLANEOUS) ×2 IMPLANT
WATER STERILE IRR 250ML POUR (IV SOLUTION) ×2 IMPLANT

## 2014-10-26 NOTE — Op Note (Signed)
10/26/2014  11:17 AM  PATIENT:  Craig Rivera  59 y.o. male  PRE-OPERATIVE DIAGNOSIS:  surgical cataract left eye  POST-OPERATIVE DIAGNOSIS:  surgical cataract left eye  PROCEDURE:  Procedure(s): CATARACT EXTRACTION PHACO AND INTRAOCULAR LENS PLACEMENT LEFT EYE CDE=8.64  SURGEON:  Surgeon(s): Williams Che, MD  ASSISTANTS:   Murlean Caller   ANESTHESIA STAFF: Anesthesiologist: Lerry Liner, MD CRNA: Mickel Baas, CRNA; Charmaine Downs, CRNA  ANESTHESIA:   topical and MAC  REQUESTED LENS POWER: 22.0  LENS IMPLANT INFORMATION:   Alcon SN60WF   S/n  [id number]  Exp  04/2019  CUMULATIVE DISSIPATED ENERGY:8.64  INDICATIONS:see office H&P for specific indications  OP FINDINGS:dense NS and PSC  COMPLICATIONS:None  PROCEDURE:  The patient was brought to the operating room in good condition.  The operative eye was prepped and draped in the usual fashion for intraocular surgery.  Lidocaine gel was dropped onto the eye.  A 2.4 mm 10 O'clock near clear corneal stepped incision and a 12 O'clock stab incision were created.  Viscoat was instilled into the anterior chamber.  The 5 mm anterior capsulorhexis was performed with a bent needle cystotome and Utrata forceps.  The lens was hydrodissected and hydrodelineated with a cannula and balanced salt solution and rotated with a Kuglen hook.  Phacoemulsification was perfomed in the divide and conquer technique.  The remaining cortex was removed with I&A and the capsular surfaces polished as necessary.  Provisc was placed into the capsular bag and the lens inserted with the Alcon inserter.  The viscoelastic was removed with I&A and the lens "rocked" into position.  The wounds were hydrated and te anterior chamber was refilled with balanced salt solution.  The wounds were checked for leakage and rehydrated as necessary.  The lid speculum and drapes were removed and the patient was transported to short stay in good condition.  PLAN OF CARE:  as above  PATIENT DISPOSITION:  Short Stay

## 2014-10-26 NOTE — Transfer of Care (Signed)
Immediate Anesthesia Transfer of Care Note  Patient: Craig Rivera  Procedure(s) Performed: Procedure(s): CATARACT EXTRACTION PHACO AND INTRAOCULAR LENS PLACEMENT LEFT EYE CDE=8.64 (Left)  Patient Location: Short Stay  Anesthesia Type:MAC  Level of Consciousness: awake, alert , oriented and patient cooperative  Airway & Oxygen Therapy: Patient Spontanous Breathing  Post-op Assessment: Report given to RN, Post -op Vital signs reviewed and stable and Patient moving all extremities  Post vital signs: Reviewed and stable  Last Vitals:  Filed Vitals:   10/26/14 0850  BP: 98/65  Temp:   Resp: 13    Complications: No apparent anesthesia complications

## 2014-10-26 NOTE — H&P (Signed)
I have reviewed the pre printed H&P, the patient was re-examined, and I have identified no significant interval changes in the patient's medical condition.  There is no change in the plan of care since the history and physical of record. 

## 2014-10-26 NOTE — Anesthesia Procedure Notes (Signed)
Procedure Name: MAC Date/Time: 10/26/2014 10:21 AM Performed by: Andree Elk, AMY A Pre-anesthesia Checklist: Patient identified, Timeout performed, Emergency Drugs available, Suction available and Patient being monitored Oxygen Delivery Method: Nasal cannula

## 2014-10-26 NOTE — Discharge Instructions (Signed)

## 2014-10-26 NOTE — Brief Op Note (Signed)
10/26/2014  11:17 AM  PATIENT:  Craig Rivera  59 y.o. male  PRE-OPERATIVE DIAGNOSIS:  surgical cataract left eye  POST-OPERATIVE DIAGNOSIS:  surgical cataract left eye  PROCEDURE:  Procedure(s): CATARACT EXTRACTION PHACO AND INTRAOCULAR LENS PLACEMENT LEFT EYE CDE=8.64  SURGEON:  Surgeon(s): Williams Che, MD  ASSISTANTS:   Murlean Caller   ANESTHESIA STAFF: Anesthesiologist: Lerry Liner, MD CRNA: Mickel Baas, CRNA; Charmaine Downs, CRNA  ANESTHESIA:   topical and MAC  REQUESTED LENS POWER: 22.0  LENS IMPLANT INFORMATION:   Alcon SN60WF   S/n  [id number]  Exp  04/2019  CUMULATIVE DISSIPATED ENERGY:8.64  INDICATIONS:see office H&P for specific indications  OP FINDINGS:dense NS and [place]  COMPLICATIONS:None  DICTATION #: none  PLAN OF CARE: as above  PATIENT DISPOSITION:  Short Stay

## 2014-10-26 NOTE — Anesthesia Preprocedure Evaluation (Signed)
Anesthesia Evaluation  Patient identified by MRN, date of birth, ID band Patient awake    Reviewed: Allergy & Precautions, NPO status , Patient's Chart, lab work & pertinent test results  Airway Mallampati: III  TM Distance: >3 FB     Dental  (+) Teeth Intact   Pulmonary Current Smoker,    breath sounds clear to auscultation       Cardiovascular hypertension, Pt. 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  Renal/GU      Musculoskeletal   Abdominal   Peds  Hematology   Anesthesia Other Findings   Reproductive/Obstetrics                             Anesthesia Physical Anesthesia Plan  ASA: III  Anesthesia Plan: MAC   Post-op Pain Management:    Induction: Intravenous  Airway Management Planned: Nasal Cannula  Additional Equipment:   Intra-op Plan:   Post-operative Plan:   Informed Consent: I have reviewed the patients History and Physical, chart, labs and discussed the procedure including the risks, benefits and alternatives for the proposed anesthesia with the patient or authorized representative who has indicated his/her understanding and acceptance.     Plan Discussed with:   Anesthesia Plan Comments:         Anesthesia Quick Evaluation

## 2014-10-26 NOTE — Anesthesia Postprocedure Evaluation (Signed)
  Anesthesia Post-op Note  Patient: Craig Rivera  Procedure(s) Performed: Procedure(s): CATARACT EXTRACTION PHACO AND INTRAOCULAR LENS PLACEMENT LEFT EYE CDE=8.64 (Left)  Patient Location: Short Stay  Anesthesia Type:MAC  Level of Consciousness: awake, alert , oriented and patient cooperative  Airway and Oxygen Therapy: Patient Spontanous Breathing  Post-op Pain: none  Post-op Assessment: Post-op Vital signs reviewed, Patient's Cardiovascular Status Stable, Respiratory Function Stable, Patent Airway, No signs of Nausea or vomiting, Adequate PO intake, Pain level controlled and No headache              Post-op Vital Signs: Reviewed and stable  Last Vitals:  Filed Vitals:   10/26/14 0850  BP: 98/65  Temp:   Resp: 13    Complications: No apparent anesthesia complications

## 2014-10-27 ENCOUNTER — Encounter (HOSPITAL_COMMUNITY): Payer: Self-pay | Admitting: Ophthalmology

## 2014-10-27 NOTE — Addendum Note (Signed)
Addendum  created 10/27/14 0735 by Vista Deck, CRNA   Modules edited: Charges VN

## 2015-02-09 DIAGNOSIS — G479 Sleep disorder, unspecified: Secondary | ICD-10-CM | POA: Insufficient documentation

## 2015-02-09 DIAGNOSIS — M545 Low back pain, unspecified: Secondary | ICD-10-CM | POA: Insufficient documentation

## 2015-04-06 DIAGNOSIS — R0989 Other specified symptoms and signs involving the circulatory and respiratory systems: Secondary | ICD-10-CM | POA: Insufficient documentation

## 2015-06-01 DIAGNOSIS — R5383 Other fatigue: Secondary | ICD-10-CM | POA: Insufficient documentation

## 2015-06-01 DIAGNOSIS — T1591XA Foreign body on external eye, part unspecified, right eye, initial encounter: Secondary | ICD-10-CM | POA: Insufficient documentation

## 2015-07-22 ENCOUNTER — Other Ambulatory Visit (HOSPITAL_COMMUNITY): Payer: Self-pay | Admitting: Oncology

## 2015-07-22 DIAGNOSIS — C9000 Multiple myeloma not having achieved remission: Secondary | ICD-10-CM | POA: Insufficient documentation

## 2015-07-27 DIAGNOSIS — R059 Cough, unspecified: Secondary | ICD-10-CM | POA: Insufficient documentation

## 2015-08-02 ENCOUNTER — Encounter (HOSPITAL_BASED_OUTPATIENT_CLINIC_OR_DEPARTMENT_OTHER): Payer: 59

## 2015-08-02 ENCOUNTER — Other Ambulatory Visit (HOSPITAL_COMMUNITY): Payer: Self-pay | Admitting: Oncology

## 2015-08-02 ENCOUNTER — Inpatient Hospital Stay (HOSPITAL_COMMUNITY): Payer: Self-pay

## 2015-08-02 ENCOUNTER — Encounter (HOSPITAL_COMMUNITY): Payer: Self-pay

## 2015-08-02 ENCOUNTER — Encounter (HOSPITAL_COMMUNITY): Payer: 59 | Attending: Hematology & Oncology

## 2015-08-02 VITALS — BP 126/70 | HR 83 | Temp 98.4°F | Resp 18 | Wt 206.2 lb

## 2015-08-02 DIAGNOSIS — Z5112 Encounter for antineoplastic immunotherapy: Secondary | ICD-10-CM

## 2015-08-02 DIAGNOSIS — C9000 Multiple myeloma not having achieved remission: Secondary | ICD-10-CM | POA: Insufficient documentation

## 2015-08-02 LAB — CBC WITH DIFFERENTIAL/PLATELET
Basophils Absolute: 0 10*3/uL (ref 0.0–0.1)
Basophils Relative: 0 %
EOS PCT: 3 %
Eosinophils Absolute: 0.1 10*3/uL (ref 0.0–0.7)
HEMATOCRIT: 38.9 % — AB (ref 39.0–52.0)
HEMOGLOBIN: 14.2 g/dL (ref 13.0–17.0)
LYMPHS ABS: 1.4 10*3/uL (ref 0.7–4.0)
LYMPHS PCT: 29 %
MCH: 35.8 pg — ABNORMAL HIGH (ref 26.0–34.0)
MCHC: 36.5 g/dL — ABNORMAL HIGH (ref 30.0–36.0)
MCV: 98 fL (ref 78.0–100.0)
MONOS PCT: 11 %
Monocytes Absolute: 0.5 10*3/uL (ref 0.1–1.0)
NEUTROS ABS: 2.8 10*3/uL (ref 1.7–7.7)
Neutrophils Relative %: 57 %
Platelets: 144 10*3/uL — ABNORMAL LOW (ref 150–400)
RBC: 3.97 MIL/uL — ABNORMAL LOW (ref 4.22–5.81)
RDW: 12.5 % (ref 11.5–15.5)
WBC: 4.8 10*3/uL (ref 4.0–10.5)

## 2015-08-02 LAB — COMPREHENSIVE METABOLIC PANEL
ALBUMIN: 4.1 g/dL (ref 3.5–5.0)
ALK PHOS: 61 U/L (ref 38–126)
ALT: 24 U/L (ref 17–63)
AST: 21 U/L (ref 15–41)
Anion gap: 6 (ref 5–15)
BILIRUBIN TOTAL: 0.5 mg/dL (ref 0.3–1.2)
BUN: 15 mg/dL (ref 6–20)
CO2: 27 mmol/L (ref 22–32)
Calcium: 9 mg/dL (ref 8.9–10.3)
Chloride: 100 mmol/L — ABNORMAL LOW (ref 101–111)
Creatinine, Ser: 0.96 mg/dL (ref 0.61–1.24)
GFR calc Af Amer: >60 mL/min (ref >60)
GFR calc non Af Amer: >60 mL/min (ref >60)
GLUCOSE: 157 mg/dL — AB (ref 65–99)
POTASSIUM: 3.7 mmol/L (ref 3.5–5.1)
SODIUM: 133 mmol/L — AB (ref 135–145)
TOTAL PROTEIN: 6.9 g/dL (ref 6.5–8.1)

## 2015-08-02 MED ORDER — BORTEZOMIB CHEMO SQ INJECTION 3.5 MG (2.5MG/ML)
1.3000 mg/m2 | Freq: Once | INTRAMUSCULAR | Status: AC
  Administered 2015-08-02: 2.75 mg via SUBCUTANEOUS
  Filled 2015-08-02: qty 2.75

## 2015-08-02 MED ORDER — PROCHLORPERAZINE MALEATE 10 MG PO TABS
ORAL_TABLET | ORAL | Status: AC
  Filled 2015-08-02: qty 1

## 2015-08-02 MED ORDER — PROCHLORPERAZINE MALEATE 10 MG PO TABS: 10.0000 mg | ORAL_TABLET | Freq: Once | ORAL | Status: DC

## 2015-08-02 NOTE — Patient Instructions (Signed)
[facility] Discharge Instructions for Patients Receiving Chemotherapy   Beginning January 23rd 2017 lab work for the Cancer Center will be done in the  Main lab at [facility] on 1st floor. If you have a lab appointment with the Cancer Center please come in thru the  Main Entrance and check in at the main information desk   Today you received the following chemotherapy agents:  Velcade  If you develop nausea and vomiting, or diarrhea that is not controlled by your medication, call the clinic.  The clinic phone number is [phone]. Office hours are Monday-Friday 8:30am-5:00pm.  BELOW ARE SYMPTOMS THAT SHOULD BE REPORTED IMMEDIATELY:  *FEVER GREATER THAN 101.0 F  *CHILLS WITH OR WITHOUT FEVER  NAUSEA AND VOMITING THAT IS NOT CONTROLLED WITH YOUR NAUSEA MEDICATION  *UNUSUAL SHORTNESS OF BREATH  *UNUSUAL BRUISING OR BLEEDING  TENDERNESS IN MOUTH AND THROAT WITH OR WITHOUT PRESENCE OF ULCERS  *URINARY PROBLEMS  *BOWEL PROBLEMS  UNUSUAL RASH Items with * indicate a potential emergency and should be followed up as soon as possible. If you have an emergency after office hours please contact your primary care physician or go to the nearest emergency department.  Please call the clinic during office hours if you have any questions or concerns.   You may also contact the Patient Navigator at [phone] should you have any questions or need assistance in obtaining follow up care.      Resources For Cancer Patients and their Caregivers ? American Cancer Society: Can assist with transportation, wigs, general needs, runs Look Good Feel Better.        1-888-227-6333 ? Cancer Care: Provides financial assistance, online support groups, medication/co-pay assistance.  1-800-813-HOPE (4673) ? [facility] Assists [place] cancer patients and their families through emotional , educational and financial support.   336-427-4357 ? [place] DSS Where to apply for food stamps, Medicaid and utility assistance. 336-342-1394 ? [facility]: Transportation to medical appointments. 336-347-2287 ? Social Security Administration: May apply for disability if have a Stage IV cancer. 336-342-7796 1-800-772-1213 ? [place] Aging, Disability and Transit Services: Assists with nutrition, care and transit needs. 336-349-2343         

## 2015-08-03 NOTE — Progress Notes (Signed)
24 hour follow up-pts wife states that he seems to be doing fine, that if he has any problems he will call us.

## 2015-08-16 ENCOUNTER — Encounter (HOSPITAL_BASED_OUTPATIENT_CLINIC_OR_DEPARTMENT_OTHER): Payer: 59

## 2015-08-16 ENCOUNTER — Encounter (HOSPITAL_COMMUNITY): Payer: 59 | Attending: Hematology & Oncology

## 2015-08-16 ENCOUNTER — Encounter: Payer: Self-pay | Admitting: Internal Medicine

## 2015-08-16 ENCOUNTER — Inpatient Hospital Stay (HOSPITAL_COMMUNITY): Payer: Self-pay

## 2015-08-16 VITALS — BP 120/75 | HR 79 | Temp 98.4°F | Resp 18 | Wt 207.2 lb

## 2015-08-16 DIAGNOSIS — C9000 Multiple myeloma not having achieved remission: Secondary | ICD-10-CM | POA: Diagnosis not present

## 2015-08-16 DIAGNOSIS — Z5112 Encounter for antineoplastic immunotherapy: Secondary | ICD-10-CM

## 2015-08-16 LAB — CBC WITH DIFFERENTIAL/PLATELET
Basophils Absolute: 0 10*3/uL (ref 0.0–0.1)
Basophils Relative: 0 %
EOS ABS: 0.2 10*3/uL (ref 0.0–0.7)
Eosinophils Relative: 2 %
HEMATOCRIT: 41.9 % (ref 39.0–52.0)
HEMOGLOBIN: 15.1 g/dL (ref 13.0–17.0)
LYMPHS ABS: 2 10*3/uL (ref 0.7–4.0)
LYMPHS PCT: 28 %
MCH: 34.2 pg — AB (ref 26.0–34.0)
MCHC: 36 g/dL (ref 30.0–36.0)
MCV: 95 fL (ref 78.0–100.0)
MONOS PCT: 7 %
Monocytes Absolute: 0.5 10*3/uL (ref 0.1–1.0)
NEUTROS PCT: 63 %
Neutro Abs: 4.6 10*3/uL (ref 1.7–7.7)
Platelets: 157 10*3/uL (ref 150–400)
RBC: 4.41 MIL/uL (ref 4.22–5.81)
RDW: 12.8 % (ref 11.5–15.5)
WBC: 7.3 10*3/uL (ref 4.0–10.5)

## 2015-08-16 LAB — COMPREHENSIVE METABOLIC PANEL
ALK PHOS: 60 U/L (ref 38–126)
ALT: 23 U/L (ref 17–63)
ANION GAP: 5 (ref 5–15)
AST: 20 U/L (ref 15–41)
Albumin: 4.3 g/dL (ref 3.5–5.0)
BILIRUBIN TOTAL: 1 mg/dL (ref 0.3–1.2)
BUN: 12 mg/dL (ref 6–20)
CALCIUM: 9.3 mg/dL (ref 8.9–10.3)
CHLORIDE: 103 mmol/L (ref 101–111)
CO2: 28 mmol/L (ref 22–32)
CREATININE: 0.88 mg/dL (ref 0.61–1.24)
Glucose, Bld: 154 mg/dL — ABNORMAL HIGH (ref 65–99)
Potassium: 4.2 mmol/L (ref 3.5–5.1)
SODIUM: 136 mmol/L (ref 135–145)
Total Protein: 7.3 g/dL (ref 6.5–8.1)

## 2015-08-16 MED ORDER — BORTEZOMIB CHEMO SQ INJECTION 3.5 MG (2.5MG/ML)
1.3000 mg/m2 | Freq: Once | INTRAMUSCULAR | Status: AC
  Administered 2015-08-16: 2.75 mg via SUBCUTANEOUS
  Filled 2015-08-16: qty 2.75

## 2015-08-16 MED ORDER — PROCHLORPERAZINE MALEATE 10 MG PO TABS: 10.0000 mg | ORAL_TABLET | Freq: Once | ORAL | Status: DC

## 2015-08-16 MED ORDER — OXYCODONE HCL ER 30 MG PO T12A: 30.0000 mg | EXTENDED_RELEASE_TABLET | Freq: Two times a day (BID) | ORAL | Status: DC

## 2015-08-16 NOTE — Progress Notes (Signed)
Craig Rivera Tolerated velcade injection  Discharged ambulatory

## 2015-08-16 NOTE — Patient Instructions (Signed)
[facility] Discharge Instructions for Patients Receiving Chemotherapy   Beginning January 23rd 2017 lab work for the Cancer Center will be done in the  Main lab at [facility] on 1st floor. If you have a lab appointment with the Cancer Center please come in thru the  Main Entrance and check in at the main information desk   Today you received the following chemotherapy agents velcade  To help prevent nausea and vomiting after your treatment, we encourage you to take your nausea medication     If you develop nausea and vomiting, or diarrhea that is not controlled by your medication, call the clinic.  The clinic phone number is [phone]. Office hours are Monday-Friday 8:30am-5:00pm.  BELOW ARE SYMPTOMS THAT SHOULD BE REPORTED IMMEDIATELY:  *FEVER GREATER THAN 101.0 F  *CHILLS WITH OR WITHOUT FEVER  NAUSEA AND VOMITING THAT IS NOT CONTROLLED WITH YOUR NAUSEA MEDICATION  *UNUSUAL SHORTNESS OF BREATH  *UNUSUAL BRUISING OR BLEEDING  TENDERNESS IN MOUTH AND THROAT WITH OR WITHOUT PRESENCE OF ULCERS  *URINARY PROBLEMS  *BOWEL PROBLEMS  UNUSUAL RASH Items with * indicate a potential emergency and should be followed up as soon as possible. If you have an emergency after office hours please contact your primary care physician or go to the nearest emergency department.  Please call the clinic during office hours if you have any questions or concerns.   You may also contact the Patient Navigator at [phone] should you have any questions or need assistance in obtaining follow up care.      Resources For Cancer Patients and their Caregivers ? American Cancer Society: Can assist with transportation, wigs, general needs, runs Look Good Feel Better.        1-888-227-6333 ? Cancer Care: Provides financial assistance, online support groups, medication/co-pay assistance.  1-800-813-HOPE (4673) ? [facility] Assists [place]  cancer patients and their families through emotional , educational and financial support.  336-427-4357 ? [place] DSS Where to apply for food stamps, Medicaid and utility assistance. 336-342-1394 ? [facility]: Transportation to medical appointments. 336-347-2287 ? Social Security Administration: May apply for disability if have a Stage IV cancer. 336-342-7796 1-800-772-1213 ? [place] Aging, Disability and Transit Services: Assists with nutrition, care and transit needs. 336-349-2343         

## 2015-08-30 ENCOUNTER — Inpatient Hospital Stay (HOSPITAL_COMMUNITY): Payer: Self-pay

## 2015-08-30 ENCOUNTER — Ambulatory Visit (HOSPITAL_COMMUNITY): Payer: Self-pay | Admitting: Hematology & Oncology

## 2015-08-30 ENCOUNTER — Encounter (HOSPITAL_BASED_OUTPATIENT_CLINIC_OR_DEPARTMENT_OTHER): Payer: 59

## 2015-08-30 ENCOUNTER — Encounter (HOSPITAL_COMMUNITY): Payer: Self-pay

## 2015-08-30 ENCOUNTER — Encounter (HOSPITAL_COMMUNITY): Payer: 59

## 2015-08-30 VITALS — BP 119/77 | HR 63 | Temp 98.3°F | Resp 18 | Wt 209.6 lb

## 2015-08-30 DIAGNOSIS — C9000 Multiple myeloma not having achieved remission: Secondary | ICD-10-CM | POA: Diagnosis not present

## 2015-08-30 DIAGNOSIS — Z5112 Encounter for antineoplastic immunotherapy: Secondary | ICD-10-CM | POA: Diagnosis not present

## 2015-08-30 LAB — COMPREHENSIVE METABOLIC PANEL
ALK PHOS: 59 U/L (ref 38–126)
ALT: 22 U/L (ref 17–63)
ANION GAP: 6 (ref 5–15)
AST: 19 U/L (ref 15–41)
Albumin: 3.9 g/dL (ref 3.5–5.0)
BILIRUBIN TOTAL: 0.5 mg/dL (ref 0.3–1.2)
BUN: 14 mg/dL (ref 6–20)
CALCIUM: 9.3 mg/dL (ref 8.9–10.3)
CO2: 27 mmol/L (ref 22–32)
CREATININE: 0.91 mg/dL (ref 0.61–1.24)
Chloride: 104 mmol/L (ref 101–111)
GFR calc non Af Amer: >60 mL/min (ref >60)
GLUCOSE: 147 mg/dL — AB (ref 65–99)
Potassium: 3.8 mmol/L (ref 3.5–5.1)
Sodium: 137 mmol/L (ref 135–145)
TOTAL PROTEIN: 6.7 g/dL (ref 6.5–8.1)

## 2015-08-30 LAB — CBC WITH DIFFERENTIAL/PLATELET
Basophils Absolute: 0 10*3/uL (ref 0.0–0.1)
Basophils Relative: 1 %
Eosinophils Absolute: 0.2 10*3/uL (ref 0.0–0.7)
Eosinophils Relative: 3 %
HEMATOCRIT: 40 % (ref 39.0–52.0)
HEMOGLOBIN: 14.4 g/dL (ref 13.0–17.0)
LYMPHS ABS: 1.5 10*3/uL (ref 0.7–4.0)
LYMPHS PCT: 26 %
MCH: 34.4 pg — AB (ref 26.0–34.0)
MCHC: 36 g/dL (ref 30.0–36.0)
MCV: 95.5 fL (ref 78.0–100.0)
MONOS PCT: 8 %
Monocytes Absolute: 0.4 10*3/uL (ref 0.1–1.0)
NEUTROS ABS: 3.6 10*3/uL (ref 1.7–7.7)
NEUTROS PCT: 62 %
Platelets: 151 10*3/uL (ref 150–400)
RBC: 4.19 MIL/uL — ABNORMAL LOW (ref 4.22–5.81)
RDW: 13.1 % (ref 11.5–15.5)
WBC: 5.7 10*3/uL (ref 4.0–10.5)

## 2015-08-30 MED ORDER — BORTEZOMIB CHEMO SQ INJECTION 3.5 MG (2.5MG/ML)
1.3000 mg/m2 | Freq: Once | INTRAMUSCULAR | Status: AC
  Administered 2015-08-30: 2.75 mg via SUBCUTANEOUS
  Filled 2015-08-30: qty 2.75

## 2015-08-30 MED ORDER — PROCHLORPERAZINE MALEATE 10 MG PO TABS: 10.0000 mg | ORAL_TABLET | Freq: Once | ORAL | Status: DC

## 2015-08-30 MED ORDER — PROCHLORPERAZINE MALEATE 10 MG PO TABS
ORAL_TABLET | ORAL | Status: AC
  Filled 2015-08-30: qty 1

## 2015-08-30 MED ORDER — HEPARIN SOD (PORK) LOCK FLUSH 100 UNIT/ML IV SOLN
INTRAVENOUS | Status: AC
  Filled 2015-08-30: qty 5

## 2015-08-30 NOTE — Patient Instructions (Signed)
[facility] Discharge Instructions for Patients Receiving Chemotherapy   Beginning January 23rd 2017 lab work for the Cancer Center will be done in the  Main lab at [facility] on 1st floor. If you have a lab appointment with the Cancer Center please come in thru the  Main Entrance and check in at the main information desk   Today you received the following chemotherapy agents Velcade injection. Follow-up as scheduled. Call clinic for any questions or concerns  To help prevent nausea and vomiting after your treatment, we encourage you to take your nausea medication   If you develop nausea and vomiting, or diarrhea that is not controlled by your medication, call the clinic.  The clinic phone number is [phone]. Office hours are Monday-Friday 8:30am-5:00pm.  BELOW ARE SYMPTOMS THAT SHOULD BE REPORTED IMMEDIATELY:  *FEVER GREATER THAN 101.0 F  *CHILLS WITH OR WITHOUT FEVER  NAUSEA AND VOMITING THAT IS NOT CONTROLLED WITH YOUR NAUSEA MEDICATION  *UNUSUAL SHORTNESS OF BREATH  *UNUSUAL BRUISING OR BLEEDING  TENDERNESS IN MOUTH AND THROAT WITH OR WITHOUT PRESENCE OF ULCERS  *URINARY PROBLEMS  *BOWEL PROBLEMS  UNUSUAL RASH Items with * indicate a potential emergency and should be followed up as soon as possible. If you have an emergency after office hours please contact your primary care physician or go to the nearest emergency department.  Please call the clinic during office hours if you have any questions or concerns.   You may also contact the Patient Navigator at [phone] should you have any questions or need assistance in obtaining follow up care.      Resources For Cancer Patients and their Caregivers ? American Cancer Society: Can assist with transportation, wigs, general needs, runs Look Good Feel Better.        1-888-227-6333 ? Cancer Care: Provides financial assistance, online support groups, medication/co-pay assistance.   1-800-813-HOPE (4673) ? [facility] Assists [place] cancer patients and their families through emotional , educational and financial support.  336-427-4357 ? [place] DSS Where to apply for food stamps, Medicaid and utility assistance. 336-342-1394 ? RCATS: Transportation to medical appointments. 336-347-2287 ? Social Security Administration: May apply for disability if have a Stage IV cancer. 336-342-7796 1-800-772-1213 ? [place] Aging, Disability and Transit Services: Assists with nutrition, care and transit needs. 336-349-2343         

## 2015-08-30 NOTE — Progress Notes (Signed)
Nelda Severe Hallett tolerated Velcade injection well without complaints. Pt discharged ambulatory in satisfactory condition with wife

## 2015-09-10 ENCOUNTER — Encounter (HOSPITAL_COMMUNITY): Payer: 59 | Attending: Hematology & Oncology | Admitting: Hematology & Oncology

## 2015-09-10 VITALS — BP 128/76 | HR 64 | Temp 98.4°F | Resp 16 | Ht 71.0 in | Wt 210.8 lb

## 2015-09-10 DIAGNOSIS — C9000 Multiple myeloma not having achieved remission: Secondary | ICD-10-CM | POA: Insufficient documentation

## 2015-09-10 DIAGNOSIS — G4700 Insomnia, unspecified: Secondary | ICD-10-CM

## 2015-09-10 DIAGNOSIS — Z720 Tobacco use: Secondary | ICD-10-CM

## 2015-09-10 DIAGNOSIS — G8929 Other chronic pain: Secondary | ICD-10-CM

## 2015-09-10 MED ORDER — FENTANYL 12 MCG/HR TD PT72: 25.0000 ug | MEDICATED_PATCH | TRANSDERMAL | 0 refills | Status: DC

## 2015-09-10 MED ORDER — FENTANYL 25 MCG/HR TD PT72: 25.0000 ug | MEDICATED_PATCH | TRANSDERMAL | 0 refills | Status: DC

## 2015-09-10 NOTE — Patient Instructions (Addendum)
[place] at [facility] Discharge Instructions  RECOMMENDATIONS MADE BY THE CONSULTANT AND ANY TEST RESULTS WILL BE SENT TO YOUR REFERRING PHYSICIAN.   You were seen by Dr. Whitney Muse today. Continue Velcade treatment and lab work through September. You will meet with our Nurse Navigator - Anderson Malta. Call clinic next week to discuss pain relief with fentanyl pain patch. Return to clinic in 2 months for follow-up. Call clinic with any questions or concerns.   Thank you for choosing [place] at [facility] to provide your oncology and hematology care.  To afford each patient quality time with our provider, please arrive at least 15 minutes before your scheduled appointment time.   Beginning January 23rd 2017 lab work for the [facility] will be done in the  Main lab at [facility] on 1st floor. If you have a lab appointment with the [facility] please come in thru the  Main Entrance and check in at the main information desk  You need to re-schedule your appointment should you arrive 10 or more minutes late.  We strive to give you quality time with our providers, and arriving late affects you and other patients whose appointments are after yours.  Also, if you no show three or more times for appointments you may be dismissed from the clinic at the providers discretion.     Again, thank you for choosing [facility].  Our hope is that these requests will decrease the amount of time that you wait before being seen by our physicians.       _____________________________________________________________  Should you have questions after your visit to [facility], please contact our office at [phone] between the hours of 8:30 a.m. and 4:30 p.m.  Voicemails left after 4:30 p.m. will not be returned until the following business day.  For prescription refill requests, have your pharmacy contact our office.         Resources  For Cancer Patients and their Caregivers ? American Cancer Society: Can assist with transportation, wigs, general needs, runs Look Good Feel Better.        847-508-0616 ? Cancer Care: Provides financial assistance, online support groups, medication/co-pay assistance.  1-800-813-HOPE 660-772-0946) ? [facility] Assists [place] cancer patients and their families through emotional , educational and financial support.  9312442110 ? [place] DSS Where to apply for food stamps, Medicaid and utility assistance. 581-296-2830 ? RCATS: Transportation to medical appointments. (906)565-6099 ? Social Security Administration: May apply for disability if have a Stage IV cancer. 5864555053 514-325-2382 ? [employer], Disability and Transit Services: Assists with nutrition, care and transit needs. [place] Support Programs: @10RELATIVEDAYS @ > Cancer Support Group  2nd Tuesday of the month 1pm-2pm, Journey Room  > Creative Journey  3rd Tuesday of the month 1130am-1pm, Journey Room  > Look Good Feel Better  1st Wednesday of the month 10am-12 noon, Journey Room (Call [facility] to register [phone])

## 2015-09-10 NOTE — Progress Notes (Signed)
[place] NOTE  Patient Care Team: Redmond School, MD as PCP - General (Internal Medicine)  CHIEF COMPLAINTS/PURPOSE OF CONSULTATION:  Multiple myeloma Bone marrow transplant at [facility] on 10/24/2013 On maintenance Velcade    Multiple myeloma ([place])   03/26/2013 Initial Diagnosis    Multiple myeloma ([place])     03/26/2013 Bone Marrow Biopsy    80% cellularity, predominantly plasma cells 90%, IgG kappa, 5400 mg/dL, with anemia, hemoglobin 8.2 g, normal calcium, normal kidney function, normal bone survey but elevated beta-2 microglobulin level-5.5, consistent with stage III disease     04/02/2013 - 08/01/2013 Chemotherapy    Velcade/dexamethasone     10/24/2013 Bone Marrow Transplant    SCT, autologous.  Day 0= 10/24/2013.     02/22/2014 - 03/26/2014 Chemotherapy    Lenalidomide 10 mg daily initiated for one month, and tolerated we'll increase to 15 mg daily.  Toxicity resulting in discontinuation.     03/26/2014 Adverse Reaction    Diffuse, worsening rash, aching and bones, joints, muscles, weakness, lenalidomide discontinued     05/06/2014 -  Chemotherapy    Velcade 1.3 mg/m every 14 days as maintenance therapy.      HISTORY OF PRESENTING ILLNESS:  Craig Rivera 60 y.o. male is here to establish ongoing care for multiple myeloma.   Craig Rivera is accompanied by his wife, Jan.   He is no longer taking the "bone medication". He read information saying it could cause jaw problems because he experienced issues with his jaw locking up.   When he took Zometa, it took everything out of him for 3 days. One day he slept for 16 hours.  Admits he does not sleep well. He was on temazopan which did not help.  He last saw Dr. Ok Edwards at [facility] in January for his immunizations. He is scheduled to see Dr. Ok Edwards again in a few weeks. Myeloma labs should be pulled at this visit.   He receives a flu shot every year.   He used to enjoy playing golf. He has not played due "myeloma  fatigue" keeping him from playing. He has not played any golf this year, but he did play last year.  He would like to discuss pain management. He has 3 vertebrate that have "some erosion." From MRIs and speaking with Dr. Carloyn Manner in [place] surgery would mostly cover things up but not fix it. He believes part of his sleeping problem is his pain pills, which keep him from sleeping. He currently takes oxycontin 30 mg extended release every 12 hours. He takes one in the morning at about 7 am and in the evening around 7 or 7:30 pm. He does not believe it carries him through the entire 12 hours. Then it takes a long time to kick in. He has some 5 mg oxycodone for break through pain. He takes these only a couple of times a week, but does not take them at night. He typically does not wake up from pain in the middle of the night, the pain usually wakes him up if he sleeps past his extended pain medication intake time.   He experiences back pain most when standing still to wash dishes or making sweeping motions.   He tried to go without the pain medication a couple of months ago because he was afraid of becoming addicted. He could only stand one day without the pain medication.  His goal with pain management is to get a normal nights sleep. He  feels as though his current pain medication acts as a stimulant, keeping him from sleeping.  He has previously used a fentanyl patch. He had issue as he was working at the time so he would sweat and the patch would come off. His last dose of fentanyl patch was 75 and it did not negatively affect his sleep at night.   He worked third shift for 9 months and his wife notes he is still in this pattern. He went to bed at 2 am last night but it took a while to fall asleep.   His wife reports he is dealing with depression. Craig Rivera admits this is not the way he wanted to retire but he just occasionally "has little pity parties" for himself. He would like to have a part time job, "But  knowing me and my condition, I wouldn't hire me". He knows with his insomnia he would only be able to work a few hours before "hitting a wall". He wouldn't want to ask someone to hire him because he doesn't feel like he would be dependable. He does still enjoy mowing his grass and pressure washing his deck.   His wife speaks about a grief support group at their church that has helped her a lot. She will speak with her husband more about joining a support group.   He currently smokes 1 pack every 2 to 3 days. He originally began smoking at 60 years-old. States he quit for a while. Admits he restarted smoking due to boredom while being unable to work and "having a pity party".   He believes his last colonoscopy was about 2 years before being diagnosed with myeloma.  Old injury on the left lower extremity above the ankle, patient states it is from a stone being thrown by a lawn mower and hitting him.   He denies chest pain, other than when his back pain gets really bad as it radiates around his ribs. He denies breathing issues. He denies leg swelling, however his wife remarks he had some the other day.    MEDICAL HISTORY:  Past Medical History:  Diagnosis Date  . Back pain   . HTN (hypertension)   . Hypercholesterolemia   . Multiple myeloma ([place])   . Sciatic nerve pain     SURGICAL HISTORY: Past Surgical History:  Procedure Laterality Date  . BONE MARROW TRANSPLANT    . CATARACT EXTRACTION W/PHACO Right 08/11/2014   Procedure: CATARACT EXTRACTION PHACO AND INTRAOCULAR LENS PLACEMENT (IOC);  Surgeon: Williams Che, MD;  Location: [facility] OR[facility];  Service: Ophthalmology;  Laterality: Right;  CDE 4.88  . CATARACT EXTRACTION W/PHACO Left 10/26/2014   Procedure: CATARACT EXTRACTION PHACO AND INTRAOCULAR LENS PLACEMENT LEFT EYE CDE=8.64;  Surgeon: Williams Che, MD;  Location: [facility] ORS;  Service: Ophthalmology;  Laterality: Left;  . COLONOSCOPY  09/12/2010   Procedure: COLONOSCOPY;  Surgeon: Daneil Dolin, MD;  Location: [facility] END[facility] SUITE;  Service: Endoscopy;  Laterality: N/A;  . inguinal hernia repair bilateral     as child   . KNEE SURGERY Right    arthroscopy  . TONSILLECTOMY      SOCIAL HISTORY: Social History   Social History  . Marital status: Married    Spouse name: N/A  . Number of children: N/A  . Years of education: N/A   Occupational History  . Not on file.   Social History Main Topics  . Smoking status: Current Every Day Smoker    Packs/day: 0.50  Years: 40.00    Types: Cigarettes  . Smokeless tobacco: Not on file  . Alcohol use No  . Drug use: No  . Sexual activity: Yes    Birth control/ protection: None   Other Topics Concern  . Not on file   Social History Narrative  . No narrative on file   Married for 25 years 4 children 9 grandchildren 1 pet, Boxer named Grace Smoker, 1 pack every 2 or 3 days. Started at 60 yo. States, he quit for a while.  He used to play golf, however the myeloma fatigue has kept him from playing Worked uptown at [employer]: Family History  Problem Relation Age of Onset  . Colon polyps Father     passed away, [place]  . Colon polyps Sister   . Migraines Mother     deceased   Mother deceased at 15 or 69 yo Father deceased at 73 yo  1 brother died to kidney disease in the 69s 1 sister passed away from [place] and a male cancer.  Oldest sister, 66 yo, is still living and has MS. Mostly healthy Neither parent had MS that he knows of. No family history of myeloma  ALLERGIES:  is allergic to penicillins and ceclor [cefaclor].  MEDICATIONS:  Current Outpatient Prescriptions  Medication Sig Dispense Refill  . acetaminophen (TYLENOL) 325 MG tablet Take 650 mg by mouth every 6 (six) hours as needed for mild pain.     Marland Kitchen acyclovir (ZOVIRAX) 200 MG capsule Take 800 mg by mouth 2 (two) times daily.   0  . amLODipine (NORVASC) 10 MG tablet Take 10 mg by mouth daily.     . bortezomib IV (VELCADE)  3.5 MG injection Inject 1.3 mg/m2 into the vein every 14 (fourteen) days.    . calcium carbonate (OS-CAL) 600 MG TABS tablet Take 600 mg by mouth 2 (two) times daily with a meal.    . cephALEXin (KEFLEX) 500 MG capsule Take 1 capsule (500 mg total) by mouth 3 (three) times daily. 15 capsule 0  . fentaNYL (DURAGESIC - DOSED MCG/HR) 75 MCG/HR Place 1 patch onto the skin every 3 (three) days. Last patch 07/27/14  0  . fluticasone (FLONASE) 50 MCG/ACT nasal spray Place 2 sprays into both nostrils daily as needed for allergies.     Marland Kitchen KLOR-CON M20 20 MEQ tablet Take 1 tablet by mouth 2 (two) times daily.  6  . naproxen sodium (ANAPROX) 220 MG tablet Take 220 mg by mouth 2 (two) times daily with a meal.    . oxyCODONE (OXY IR/ROXICODONE) 5 MG immediate release tablet Take 1 tablet by mouth every 4 (four) hours as needed. pain  0  . oxyCODONE (OXYCONTIN) 30 MG 12 hr tablet Take 30 mg by mouth 2 (two) times daily. 60 each 0  . polyethylene glycol (MIRALAX / GLYCOLAX) packet Take 17 g by mouth daily as needed for mild constipation.    . tamsulosin (FLOMAX) 0.4 MG CAPS capsule Take 1 capsule by mouth 2 (two) times daily.  5   No current facility-administered medications for this visit.     Review of Systems  Constitutional: Positive for malaise/fatigue.  HENT: Negative.   Eyes: Negative.   Respiratory: Negative.  Negative for shortness of breath.   Cardiovascular: Negative.   Gastrointestinal: Negative.   Genitourinary: Negative.   Musculoskeletal: Positive for back pain.       Back pain poorly managed with oxycontin and oxycodone  Skin: Negative.  Neurological: Negative.   Endo/Heme/Allergies: Negative.   Psychiatric/Behavioral: Positive for depression. The patient has insomnia.        Poor sleep associated with back pain. Depression secondary to not being able to do things he previously could and insomnia.  All other systems reviewed and are negative.  14 point ROS was done and is otherwise as  detailed above or in HPI   PHYSICAL EXAMINATION: ECOG PERFORMANCE STATUS: 1 - Symptomatic but completely ambulatory  Vitals:   09/10/15 0831  BP: 128/76  Pulse: 64  Resp: 16  Temp: 98.4 F (36.9 C)   Filed Weights   09/10/15 0831  Weight: 210 lb 12.8 oz (95.6 kg)    Physical Exam  Constitutional: He is oriented to person, place, and time and well-developed, well-nourished, and in no distress.  HENT:  Head: Normocephalic and atraumatic.  Nose: Nose normal.  Mouth/Throat: Oropharynx is clear and moist. No oropharyngeal exudate.  Eyes: Conjunctivae and EOM are normal. Pupils are equal, round, and reactive to light. Right eye exhibits no discharge. Left eye exhibits no discharge. No scleral icterus.  Neck: Normal range of motion. Neck supple. No tracheal deviation present. No thyromegaly present.  Cardiovascular: Normal rate, regular rhythm and normal heart sounds.  Exam reveals no gallop and no friction rub.   No murmur heard. Pulmonary/Chest: Effort normal and breath sounds normal. He has no wheezes. He has no rales.  Abdominal: Soft. Bowel sounds are normal. He exhibits no distension and no mass. There is no tenderness. There is no rebound and no guarding.  Musculoskeletal: Normal range of motion. He exhibits no edema.  Lymphadenopathy:    He has no cervical adenopathy.  Neurological: He is alert and oriented to person, place, and time. He has normal reflexes. No cranial nerve deficit. Gait normal. Coordination normal.  Skin: Skin is warm and dry. No rash noted.  LLE old injury from mowing incident.  Psychiatric: Mood, memory, affect and judgment normal.  Nursing note and vitals reviewed.    LABORATORY DATA:  I have reviewed the data as listed Lab Results  Component Value Date   WBC 5.7 08/30/2015   HGB 14.4 08/30/2015   HCT 40.0 08/30/2015   MCV 95.5 08/30/2015   PLT 151 08/30/2015   CMP     Component Value Date/Time   NA 137 08/30/2015 1415   K 3.8 08/30/2015  1415   CL 104 08/30/2015 1415   CO2 27 08/30/2015 1415   GLUCOSE 147 (H) 08/30/2015 1415   BUN 14 08/30/2015 1415   CREATININE 0.91 08/30/2015 1415   CALCIUM 9.3 08/30/2015 1415   PROT 6.7 08/30/2015 1415   ALBUMIN 3.9 08/30/2015 1415   AST 19 08/30/2015 1415   ALT 22 08/30/2015 1415   ALKPHOS 59 08/30/2015 1415   BILITOT 0.5 08/30/2015 1415   GFRNONAA >60 08/30/2015 1415   GFRAA >60 08/30/2015 1415     ASSESSMENT & PLAN:  Multiple myeloma, IgG kappa 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 He is to continue with ongoing follow-up at [facility].   Prior labs reviewed.   Last seen by Dr. Ok Edwards at [facility] on 02/16/15. He received  immunizations at this visit. He will return for follow up with Dr. Ok Edwards in the next month. Myeloma labs should be checked at this visit.   Last seen by Dr. Tressie Stalker  on 07/27/15.  The patient's main complaint today is poor pain management. He is currently taking long acting oxycontin 30 mg with oxycodone 5 mg for break through pain. He is not sleeping well with these medications.  I have written him for the fentanyl patch, he is to discontinue his oxycontin.  He will contact us next week about how he is doing.   I spoke with the patient about the support groups which are offered here at [facility]. I have encouraged him to join one of these groups to help his mood, especially the group Finding Your New Normal. He is not interested at this time.  I will look into whether he qualifies for lung cancer screening.   He will return for follow up in about 8 weeks to assess his pain management and discuss future follow up schedule.   All questions were answered. The patient knows to call the clinic with any problems, questions or concerns.  This document serves as a record of services personally  performed by Ancil Linsey, MD. It was created on her behalf by Arlyce Harman, a trained medical scribe. The creation of this record is based on the scribe's personal observations and the provider's statements to them. This document has been checked and approved by the attending provider.  I have reviewed the above documentation for accuracy and completeness, and I agree with the above.  This note was electronically signed.    Molli Hazard, MD  09/10/2015 8:36 AM

## 2015-09-10 NOTE — Progress Notes (Signed)
Pt called and was confused on how to use the fentanyl patches.  Pt is to use one of the 25 mcg patches and 1 of the 12.43mcg patches on.

## 2015-09-13 ENCOUNTER — Encounter (HOSPITAL_COMMUNITY): Payer: 59

## 2015-09-13 ENCOUNTER — Encounter (HOSPITAL_COMMUNITY): Payer: Self-pay

## 2015-09-13 ENCOUNTER — Encounter (HOSPITAL_BASED_OUTPATIENT_CLINIC_OR_DEPARTMENT_OTHER): Payer: 59

## 2015-09-13 VITALS — BP 129/82 | HR 70 | Temp 98.2°F | Resp 18 | Wt 210.0 lb

## 2015-09-13 DIAGNOSIS — Z5112 Encounter for antineoplastic immunotherapy: Secondary | ICD-10-CM | POA: Diagnosis not present

## 2015-09-13 DIAGNOSIS — C9000 Multiple myeloma not having achieved remission: Secondary | ICD-10-CM | POA: Diagnosis not present

## 2015-09-13 LAB — COMPREHENSIVE METABOLIC PANEL
ALBUMIN: 4.2 g/dL (ref 3.5–5.0)
ALK PHOS: 60 U/L (ref 38–126)
ALT: 23 U/L (ref 17–63)
ANION GAP: 7 (ref 5–15)
AST: 19 U/L (ref 15–41)
BILIRUBIN TOTAL: 0.7 mg/dL (ref 0.3–1.2)
BUN: 13 mg/dL (ref 6–20)
CO2: 27 mmol/L (ref 22–32)
Calcium: 9 mg/dL (ref 8.9–10.3)
Chloride: 102 mmol/L (ref 101–111)
Creatinine, Ser: 0.84 mg/dL (ref 0.61–1.24)
GFR calc Af Amer: >60 mL/min (ref >60)
GFR calc non Af Amer: >60 mL/min (ref >60)
Glucose, Bld: 117 mg/dL — ABNORMAL HIGH (ref 65–99)
Potassium: 3.6 mmol/L (ref 3.5–5.1)
SODIUM: 136 mmol/L (ref 135–145)
Total Protein: 7.1 g/dL (ref 6.5–8.1)

## 2015-09-13 LAB — CBC WITH DIFFERENTIAL/PLATELET
BASOS PCT: 0 %
Basophils Absolute: 0 10*3/uL (ref 0.0–0.1)
Eosinophils Absolute: 0.1 10*3/uL (ref 0.0–0.7)
Eosinophils Relative: 2 %
HEMATOCRIT: 43 % (ref 39.0–52.0)
HEMOGLOBIN: 15.3 g/dL (ref 13.0–17.0)
LYMPHS ABS: 2.1 10*3/uL (ref 0.7–4.0)
Lymphocytes Relative: 27 %
MCH: 33.8 pg (ref 26.0–34.0)
MCHC: 35.6 g/dL (ref 30.0–36.0)
MCV: 95.1 fL (ref 78.0–100.0)
MONOS PCT: 7 %
Monocytes Absolute: 0.5 10*3/uL (ref 0.1–1.0)
NEUTROS ABS: 4.9 10*3/uL (ref 1.7–7.7)
NEUTROS PCT: 64 %
Platelets: 162 10*3/uL (ref 150–400)
RBC: 4.52 MIL/uL (ref 4.22–5.81)
RDW: 13.1 % (ref 11.5–15.5)
WBC: 7.7 10*3/uL (ref 4.0–10.5)

## 2015-09-13 MED ORDER — BORTEZOMIB CHEMO SQ INJECTION 3.5 MG (2.5MG/ML)
1.3000 mg/m2 | Freq: Once | INTRAMUSCULAR | Status: AC
  Administered 2015-09-13: 2.75 mg via SUBCUTANEOUS
  Filled 2015-09-13: qty 2.75

## 2015-09-13 MED ORDER — PROCHLORPERAZINE MALEATE 10 MG PO TABS: 10.0000 mg | ORAL_TABLET | Freq: Once | ORAL | Status: DC

## 2015-09-13 NOTE — Patient Instructions (Signed)
[facility] Discharge Instructions for Patients Receiving Chemotherapy   Beginning January 23rd 2017 lab work for the Cancer Center will be done in the  Main lab at [facility] on 1st floor. If you have a lab appointment with the Cancer Center please come in thru the  Main Entrance and check in at the main information desk   Today you received the following chemotherapy agents:  Velcade  If you develop nausea and vomiting, or diarrhea that is not controlled by your medication, call the clinic.  The clinic phone number is [phone]. Office hours are Monday-Friday 8:30am-5:00pm.  BELOW ARE SYMPTOMS THAT SHOULD BE REPORTED IMMEDIATELY:  *FEVER GREATER THAN 101.0 F  *CHILLS WITH OR WITHOUT FEVER  NAUSEA AND VOMITING THAT IS NOT CONTROLLED WITH YOUR NAUSEA MEDICATION  *UNUSUAL SHORTNESS OF BREATH  *UNUSUAL BRUISING OR BLEEDING  TENDERNESS IN MOUTH AND THROAT WITH OR WITHOUT PRESENCE OF ULCERS  *URINARY PROBLEMS  *BOWEL PROBLEMS  UNUSUAL RASH Items with * indicate a potential emergency and should be followed up as soon as possible. If you have an emergency after office hours please contact your primary care physician or go to the nearest emergency department.  Please call the clinic during office hours if you have any questions or concerns.   You may also contact the Patient Navigator at [phone] should you have any questions or need assistance in obtaining follow up care.      Resources For Cancer Patients and their Caregivers ? American Cancer Society: Can assist with transportation, wigs, general needs, runs Look Good Feel Better.        1-888-227-6333 ? Cancer Care: Provides financial assistance, online support groups, medication/co-pay assistance.  1-800-813-HOPE (4673) ? [facility] Assists [place] cancer patients and their families through emotional , educational and financial support.   336-427-4357 ? [place] DSS Where to apply for food stamps, Medicaid and utility assistance. 336-342-1394 ? RCATS: Transportation to medical appointments. 336-347-2287 ? Social Security Administration: May apply for disability if have a Stage IV cancer. 336-342-7796 1-800-772-1213 ? [place] Aging, Disability and Transit Services: Assists with nutrition, care and transit needs. 336-349-2343         

## 2015-09-23 ENCOUNTER — Telehealth (HOSPITAL_COMMUNITY): Payer: Self-pay | Admitting: Emergency Medicine

## 2015-09-23 ENCOUNTER — Other Ambulatory Visit (HOSPITAL_COMMUNITY): Payer: Self-pay | Admitting: Emergency Medicine

## 2015-09-23 MED ORDER — FENTANYL 75 MCG/HR TD PT72: 75.0000 ug | MEDICATED_PATCH | TRANSDERMAL | 0 refills | Status: DC

## 2015-09-23 NOTE — Telephone Encounter (Signed)
Pt states that he has increased his fentanyl patches to 2 79mcg and 2 12.5 mcg patches equaling 75 mcg.  He thinks this dose is going to work for him.  The pain seems to be under control.  I will have him a script ready for tomorrow for fentanyl 75 mcg patches.

## 2015-09-24 ENCOUNTER — Other Ambulatory Visit (HOSPITAL_COMMUNITY): Payer: Self-pay

## 2015-09-24 DIAGNOSIS — C9000 Multiple myeloma not having achieved remission: Secondary | ICD-10-CM

## 2015-09-27 ENCOUNTER — Encounter (HOSPITAL_COMMUNITY): Payer: 59

## 2015-09-27 ENCOUNTER — Encounter (HOSPITAL_BASED_OUTPATIENT_CLINIC_OR_DEPARTMENT_OTHER): Payer: 59

## 2015-09-27 VITALS — BP 142/83 | HR 59 | Temp 98.0°F | Resp 18

## 2015-09-27 DIAGNOSIS — Z5112 Encounter for antineoplastic immunotherapy: Secondary | ICD-10-CM | POA: Diagnosis not present

## 2015-09-27 DIAGNOSIS — C9000 Multiple myeloma not having achieved remission: Secondary | ICD-10-CM | POA: Diagnosis not present

## 2015-09-27 LAB — COMPREHENSIVE METABOLIC PANEL
ALBUMIN: 4 g/dL (ref 3.5–5.0)
ALK PHOS: 54 U/L (ref 38–126)
ALT: 25 U/L (ref 17–63)
AST: 21 U/L (ref 15–41)
Anion gap: 7 (ref 5–15)
BUN: 12 mg/dL (ref 6–20)
CALCIUM: 9.3 mg/dL (ref 8.9–10.3)
CO2: 27 mmol/L (ref 22–32)
CREATININE: 0.76 mg/dL (ref 0.61–1.24)
Chloride: 103 mmol/L (ref 101–111)
GFR calc Af Amer: >60 mL/min (ref >60)
GFR calc non Af Amer: >60 mL/min (ref >60)
GLUCOSE: 166 mg/dL — AB (ref 65–99)
Potassium: 3.9 mmol/L (ref 3.5–5.1)
SODIUM: 137 mmol/L (ref 135–145)
Total Bilirubin: 0.8 mg/dL (ref 0.3–1.2)
Total Protein: 6.9 g/dL (ref 6.5–8.1)

## 2015-09-27 LAB — CBC WITH DIFFERENTIAL/PLATELET
BASOS PCT: 0 %
Basophils Absolute: 0 10*3/uL (ref 0.0–0.1)
EOS PCT: 2 %
Eosinophils Absolute: 0.1 10*3/uL (ref 0.0–0.7)
HCT: 41.9 % (ref 39.0–52.0)
HEMOGLOBIN: 14.9 g/dL (ref 13.0–17.0)
Lymphocytes Relative: 29 %
Lymphs Abs: 1.8 10*3/uL (ref 0.7–4.0)
MCH: 34.2 pg — AB (ref 26.0–34.0)
MCHC: 35.6 g/dL (ref 30.0–36.0)
MCV: 96.1 fL (ref 78.0–100.0)
MONO ABS: 0.5 10*3/uL (ref 0.1–1.0)
MONOS PCT: 8 %
Neutro Abs: 3.7 10*3/uL (ref 1.7–7.7)
Neutrophils Relative %: 61 %
Platelets: 138 10*3/uL — ABNORMAL LOW (ref 150–400)
RBC: 4.36 MIL/uL (ref 4.22–5.81)
RDW: 13 % (ref 11.5–15.5)
WBC: 6.1 10*3/uL (ref 4.0–10.5)

## 2015-09-27 MED ORDER — BORTEZOMIB CHEMO SQ INJECTION 3.5 MG (2.5MG/ML)
1.3000 mg/m2 | Freq: Once | INTRAMUSCULAR | Status: AC
  Administered 2015-09-27: 2.75 mg via SUBCUTANEOUS
  Filled 2015-09-27: qty 2.75

## 2015-09-27 NOTE — Patient Instructions (Signed)
[facility] Discharge Instructions for Patients Receiving Chemotherapy   Beginning January 23rd 2017 lab work for the [facility] will be done in the  Main lab at [facility] on 1st floor. If you have a lab appointment with the [place] please come in thru the  Main Entrance and check in at the main information desk   Today you received the following chemotherapy agents Velcade injection. If you develop nausea and vomiting, or diarrhea that is not controlled by your medication, call the clinic.  The clinic phone number is [phone]. Office hours are Monday-Friday 8:30am-5:00pm.  BELOW ARE SYMPTOMS THAT SHOULD BE REPORTED IMMEDIATELY:  *FEVER GREATER THAN 101.0 F  *CHILLS WITH OR WITHOUT FEVER  NAUSEA AND VOMITING THAT IS NOT CONTROLLED WITH YOUR NAUSEA MEDICATION  *UNUSUAL SHORTNESS OF BREATH  *UNUSUAL BRUISING OR BLEEDING  TENDERNESS IN MOUTH AND THROAT WITH OR WITHOUT PRESENCE OF ULCERS  *URINARY PROBLEMS  *BOWEL PROBLEMS  UNUSUAL RASH Items with * indicate a potential emergency and should be followed up as soon as possible. If you have an emergency after office hours please contact your primary care physician or go to the nearest emergency department.  Please call the clinic during office hours if you have any questions or concerns.   You may also contact the Patient Navigator at [phone] should you have any questions or need assistance in obtaining follow up care.      Resources For Cancer Patients and their Caregivers ? American Cancer Society: Can assist with transportation, wigs, general needs, runs Look Good Feel Better.        215-283-2238 ? Cancer Care: Provides financial assistance, online support groups, medication/co-pay assistance.  1-800-813-HOPE (352)540-4096) ? [facility] Assists [place] cancer patients and their families through emotional , educational and financial support.   504-323-7669 ? [place] DSS Where to apply for food stamps, Medicaid and utility assistance. 727-318-2032 ? [facility]: Transportation to medical appointments. (224)541-1813 ? Social Security Administration: May apply for disability if have a Stage IV cancer. [phone] 615-398-6952 ? [employer], Disability and Transit Services: Assists with nutrition, care and transit needs. [phone]

## 2015-09-27 NOTE — Progress Notes (Signed)
Patient declined compazine tablet. Velcade injection given left lower abdomen. Tolerated well. Ambulatory on discharge home to self.

## 2015-10-05 ENCOUNTER — Other Ambulatory Visit (HOSPITAL_COMMUNITY): Payer: Self-pay | Admitting: Emergency Medicine

## 2015-10-05 MED ORDER — FENTANYL 75 MCG/HR TD PT72: 75.0000 ug | MEDICATED_PATCH | TRANSDERMAL | 0 refills | Status: DC

## 2015-10-05 MED ORDER — ACYCLOVIR 200 MG PO CAPS: 400.0000 mg | ORAL_CAPSULE | Freq: Two times a day (BID) | ORAL | 2 refills | Status: DC

## 2015-10-09 ENCOUNTER — Encounter (HOSPITAL_COMMUNITY): Payer: Self-pay | Admitting: Hematology & Oncology

## 2015-10-12 ENCOUNTER — Encounter (HOSPITAL_COMMUNITY): Payer: 59

## 2015-10-12 ENCOUNTER — Encounter (HOSPITAL_COMMUNITY): Payer: 59 | Attending: Hematology & Oncology

## 2015-10-12 VITALS — BP 155/84 | HR 63 | Temp 98.4°F | Resp 18 | Wt 212.0 lb

## 2015-10-12 DIAGNOSIS — C9000 Multiple myeloma not having achieved remission: Secondary | ICD-10-CM | POA: Diagnosis not present

## 2015-10-12 DIAGNOSIS — Z5112 Encounter for antineoplastic immunotherapy: Secondary | ICD-10-CM | POA: Diagnosis not present

## 2015-10-12 LAB — CBC WITH DIFFERENTIAL/PLATELET
BASOS ABS: 0 10*3/uL (ref 0.0–0.1)
Basophils Relative: 0 %
EOS PCT: 2 %
Eosinophils Absolute: 0.1 10*3/uL (ref 0.0–0.7)
HCT: 43.7 % (ref 39.0–52.0)
Hemoglobin: 15.4 g/dL (ref 13.0–17.0)
LYMPHS ABS: 2.1 10*3/uL (ref 0.7–4.0)
LYMPHS PCT: 31 %
MCH: 33.8 pg (ref 26.0–34.0)
MCHC: 35.2 g/dL (ref 30.0–36.0)
MCV: 96 fL (ref 78.0–100.0)
MONO ABS: 0.6 10*3/uL (ref 0.1–1.0)
MONOS PCT: 9 %
Neutro Abs: 4 10*3/uL (ref 1.7–7.7)
Neutrophils Relative %: 58 %
PLATELETS: 156 10*3/uL (ref 150–400)
RBC: 4.55 MIL/uL (ref 4.22–5.81)
RDW: 13 % (ref 11.5–15.5)
WBC: 6.9 10*3/uL (ref 4.0–10.5)

## 2015-10-12 LAB — COMPREHENSIVE METABOLIC PANEL
ALT: 19 U/L (ref 17–63)
ANION GAP: 7 (ref 5–15)
AST: 18 U/L (ref 15–41)
Albumin: 4.2 g/dL (ref 3.5–5.0)
Alkaline Phosphatase: 58 U/L (ref 38–126)
BUN: 13 mg/dL (ref 6–20)
CHLORIDE: 104 mmol/L (ref 101–111)
CO2: 27 mmol/L (ref 22–32)
CREATININE: 0.71 mg/dL (ref 0.61–1.24)
Calcium: 9.2 mg/dL (ref 8.9–10.3)
Glucose, Bld: 102 mg/dL — ABNORMAL HIGH (ref 65–99)
POTASSIUM: 4 mmol/L (ref 3.5–5.1)
SODIUM: 138 mmol/L (ref 135–145)
Total Bilirubin: 0.9 mg/dL (ref 0.3–1.2)
Total Protein: 7 g/dL (ref 6.5–8.1)

## 2015-10-12 MED ORDER — BORTEZOMIB CHEMO SQ INJECTION 3.5 MG (2.5MG/ML)
1.3000 mg/m2 | Freq: Once | INTRAMUSCULAR | Status: AC
  Administered 2015-10-12: 2.75 mg via SUBCUTANEOUS
  Filled 2015-10-12: qty 2.75

## 2015-10-12 MED ORDER — PROCHLORPERAZINE MALEATE 10 MG PO TABS: 10.0000 mg | ORAL_TABLET | Freq: Once | ORAL | Status: DC

## 2015-10-12 NOTE — Patient Instructions (Signed)
[facility] Discharge Instructions for Patients Receiving Chemotherapy   Beginning January 23rd 2017 lab work for the Cancer Center will be done in the  Main lab at [place] on 1st floor. If you have a lab appointment with the Cancer Center please come in thru the  Main Entrance and check in at the main information desk   Today you received the following chemotherapy agents Velcade. Follow-up as scheduled. Call clinic for any questions or concerns  To help prevent nausea and vomiting after your treatment, we encourage you to take your nausea medication   If you develop nausea and vomiting, or diarrhea that is not controlled by your medication, call the clinic.  The clinic phone number is [phone]. Office hours are Monday-Friday 8:30am-5:00pm.  BELOW ARE SYMPTOMS THAT SHOULD BE REPORTED IMMEDIATELY:  *FEVER GREATER THAN 101.0 F  *CHILLS WITH OR WITHOUT FEVER  NAUSEA AND VOMITING THAT IS NOT CONTROLLED WITH YOUR NAUSEA MEDICATION  *UNUSUAL SHORTNESS OF BREATH  *UNUSUAL BRUISING OR BLEEDING  TENDERNESS IN MOUTH AND THROAT WITH OR WITHOUT PRESENCE OF ULCERS  *URINARY PROBLEMS  *BOWEL PROBLEMS  UNUSUAL RASH Items with * indicate a potential emergency and should be followed up as soon as possible. If you have an emergency after office hours please contact your primary care physician or go to the nearest emergency department.  Please call the clinic during office hours if you have any questions or concerns.   You may also contact the Patient Navigator at [phone] should you have any questions or need assistance in obtaining follow up care.      Resources For Cancer Patients and their Caregivers ? American Cancer Society: Can assist with transportation, wigs, general needs, runs Look Good Feel Better.        1-888-227-6333 ? Cancer Care: Provides financial assistance, online support groups, medication/co-pay assistance.  1-800-813-HOPE  (4673) ? [facility] Assists [place] cancer patients and their families through emotional , educational and financial support.  336-427-4357 ? [place] DSS Where to apply for food stamps, Medicaid and utility assistance. 336-342-1394 ? [facility]: Transportation to medical appointments. 336-347-2287 ? Social Security Administration: May apply for disability if have a Stage IV cancer. 336-342-7796 1-800-772-1213 ? [place] Aging, Disability and Transit Services: Assists with nutrition, care and transit needs. [phone]         

## 2015-10-12 NOTE — Progress Notes (Signed)
Craig Rivera tolerated Velcade injection well without complaints. VSS. Pt discharged self ambulatory in satisfactory condition

## 2015-10-26 ENCOUNTER — Encounter (HOSPITAL_COMMUNITY): Payer: 59

## 2015-10-26 ENCOUNTER — Inpatient Hospital Stay (HOSPITAL_COMMUNITY): Payer: Self-pay

## 2015-10-26 ENCOUNTER — Encounter (HOSPITAL_COMMUNITY): Payer: Self-pay

## 2015-10-26 ENCOUNTER — Encounter (HOSPITAL_BASED_OUTPATIENT_CLINIC_OR_DEPARTMENT_OTHER): Payer: 59

## 2015-10-26 VITALS — BP 126/76 | HR 70 | Temp 98.8°F | Resp 18

## 2015-10-26 DIAGNOSIS — Z5112 Encounter for antineoplastic immunotherapy: Secondary | ICD-10-CM | POA: Diagnosis not present

## 2015-10-26 DIAGNOSIS — C9000 Multiple myeloma not having achieved remission: Secondary | ICD-10-CM | POA: Diagnosis not present

## 2015-10-26 LAB — COMPREHENSIVE METABOLIC PANEL
ALK PHOS: 53 U/L (ref 38–126)
ALT: 19 U/L (ref 17–63)
AST: 15 U/L (ref 15–41)
Albumin: 4.1 g/dL (ref 3.5–5.0)
Anion gap: 6 (ref 5–15)
BILIRUBIN TOTAL: 0.7 mg/dL (ref 0.3–1.2)
BUN: 14 mg/dL (ref 6–20)
CALCIUM: 9.4 mg/dL (ref 8.9–10.3)
CO2: 28 mmol/L (ref 22–32)
CREATININE: 0.73 mg/dL (ref 0.61–1.24)
Chloride: 103 mmol/L (ref 101–111)
Glucose, Bld: 113 mg/dL — ABNORMAL HIGH (ref 65–99)
Potassium: 4.2 mmol/L (ref 3.5–5.1)
Sodium: 137 mmol/L (ref 135–145)
Total Protein: 7 g/dL (ref 6.5–8.1)

## 2015-10-26 LAB — CBC WITH DIFFERENTIAL/PLATELET
Basophils Absolute: 0 10*3/uL (ref 0.0–0.1)
Basophils Relative: 0 %
EOS PCT: 2 %
Eosinophils Absolute: 0.2 10*3/uL (ref 0.0–0.7)
HCT: 41.8 % (ref 39.0–52.0)
HEMOGLOBIN: 14.8 g/dL (ref 13.0–17.0)
LYMPHS ABS: 1.7 10*3/uL (ref 0.7–4.0)
LYMPHS PCT: 24 %
MCH: 33.9 pg (ref 26.0–34.0)
MCHC: 35.4 g/dL (ref 30.0–36.0)
MCV: 95.9 fL (ref 78.0–100.0)
Monocytes Absolute: 0.5 10*3/uL (ref 0.1–1.0)
Monocytes Relative: 8 %
NEUTROS PCT: 66 %
Neutro Abs: 4.6 10*3/uL (ref 1.7–7.7)
Platelets: 173 10*3/uL (ref 150–400)
RBC: 4.36 MIL/uL (ref 4.22–5.81)
RDW: 12.4 % (ref 11.5–15.5)
WBC: 7 10*3/uL (ref 4.0–10.5)

## 2015-10-26 MED ORDER — PROCHLORPERAZINE MALEATE 10 MG PO TABS: 10.0000 mg | ORAL_TABLET | Freq: Once | ORAL | Status: DC

## 2015-10-26 MED ORDER — BORTEZOMIB CHEMO SQ INJECTION 3.5 MG (2.5MG/ML)
1.3000 mg/m2 | Freq: Once | INTRAMUSCULAR | Status: AC
  Administered 2015-10-26: 2.75 mg via SUBCUTANEOUS
  Filled 2015-10-26: qty 2.75

## 2015-10-26 NOTE — Patient Instructions (Signed)
[facility] Discharge Instructions for Patients Receiving Chemotherapy   Beginning January 23rd 2017 lab work for the Cancer Center will be done in the  Main lab at [place] on 1st floor. If you have a lab appointment with the Cancer Center please come in thru the  Main Entrance and check in at the main information desk   Today you received the following chemotherapy agents:  Velcade  If you develop nausea and vomiting, or diarrhea that is not controlled by your medication, call the clinic.  The clinic phone number is [phone]. Office hours are Monday-Friday 8:30am-5:00pm.  BELOW ARE SYMPTOMS THAT SHOULD BE REPORTED IMMEDIATELY:  *FEVER GREATER THAN 101.0 F  *CHILLS WITH OR WITHOUT FEVER  NAUSEA AND VOMITING THAT IS NOT CONTROLLED WITH YOUR NAUSEA MEDICATION  *UNUSUAL SHORTNESS OF BREATH  *UNUSUAL BRUISING OR BLEEDING  TENDERNESS IN MOUTH AND THROAT WITH OR WITHOUT PRESENCE OF ULCERS  *URINARY PROBLEMS  *BOWEL PROBLEMS  UNUSUAL RASH Items with * indicate a potential emergency and should be followed up as soon as possible. If you have an emergency after office hours please contact your primary care physician or go to the nearest emergency department.  Please call the clinic during office hours if you have any questions or concerns.   You may also contact the Patient Navigator at [phone] should you have any questions or need assistance in obtaining follow up care.      Resources For Cancer Patients and their Caregivers ? American Cancer Society: Can assist with transportation, wigs, general needs, runs Look Good Feel Better.        1-888-227-6333 ? Cancer Care: Provides financial assistance, online support groups, medication/co-pay assistance.  1-800-813-HOPE (4673) ? [facility] Assists [place] cancer patients and their families through emotional , educational and financial support.   336-427-4357 ? [place] DSS Where to apply for food stamps, Medicaid and utility assistance. 336-342-1394 ? [facility]ATS: Transportation to medical appointments. 336-347-2287 ? Social Security Administration: May apply for disability if have a Stage IV cancer. 336-342-7796 1-800-772-1213 ? [place] Aging, Disability and Transit Services: Assists with nutrition, care and transit needs. [phone]         

## 2015-10-26 NOTE — Progress Notes (Signed)
1445:  Craig Rivera presents today for injection per the provider's orders.  Velcade administration without incident; see MAR for injection details.  Patient tolerated procedure well and without incident.  No questions or complaints noted at this time.  Discharged ambulatory in no distress.

## 2015-11-04 ENCOUNTER — Other Ambulatory Visit (HOSPITAL_COMMUNITY): Payer: Self-pay | Admitting: Emergency Medicine

## 2015-11-04 MED ORDER — FENTANYL 75 MCG/HR TD PT72: 75.0000 ug | MEDICATED_PATCH | TRANSDERMAL | 0 refills | Status: DC

## 2015-11-04 MED ORDER — OXYCODONE HCL 5 MG PO TABS: 5.0000 mg | ORAL_TABLET | ORAL | 0 refills | Status: DC | PRN

## 2015-11-09 ENCOUNTER — Telehealth (HOSPITAL_COMMUNITY): Payer: Self-pay | Admitting: *Deleted

## 2015-11-09 ENCOUNTER — Encounter (HOSPITAL_COMMUNITY): Payer: 59

## 2015-11-09 ENCOUNTER — Encounter (HOSPITAL_COMMUNITY): Payer: Self-pay | Admitting: Hematology & Oncology

## 2015-11-09 ENCOUNTER — Encounter (HOSPITAL_COMMUNITY): Payer: 59 | Attending: Hematology & Oncology | Admitting: Hematology & Oncology

## 2015-11-09 VITALS — BP 113/75 | HR 88 | Temp 98.1°F | Resp 18 | Wt 213.8 lb

## 2015-11-09 DIAGNOSIS — M5430 Sciatica, unspecified side: Secondary | ICD-10-CM | POA: Diagnosis not present

## 2015-11-09 DIAGNOSIS — Z9889 Other specified postprocedural states: Secondary | ICD-10-CM | POA: Diagnosis not present

## 2015-11-09 DIAGNOSIS — F17210 Nicotine dependence, cigarettes, uncomplicated: Secondary | ICD-10-CM | POA: Diagnosis not present

## 2015-11-09 DIAGNOSIS — C9000 Multiple myeloma not having achieved remission: Secondary | ICD-10-CM | POA: Diagnosis not present

## 2015-11-09 DIAGNOSIS — Z8371 Family history of colonic polyps: Secondary | ICD-10-CM | POA: Insufficient documentation

## 2015-11-09 DIAGNOSIS — G4700 Insomnia, unspecified: Secondary | ICD-10-CM | POA: Insufficient documentation

## 2015-11-09 DIAGNOSIS — I10 Essential (primary) hypertension: Secondary | ICD-10-CM | POA: Insufficient documentation

## 2015-11-09 DIAGNOSIS — Z888 Allergy status to other drugs, medicaments and biological substances status: Secondary | ICD-10-CM | POA: Diagnosis not present

## 2015-11-09 DIAGNOSIS — Z9481 Bone marrow transplant status: Secondary | ICD-10-CM

## 2015-11-09 DIAGNOSIS — Z79899 Other long term (current) drug therapy: Secondary | ICD-10-CM | POA: Diagnosis not present

## 2015-11-09 DIAGNOSIS — G893 Neoplasm related pain (acute) (chronic): Secondary | ICD-10-CM

## 2015-11-09 DIAGNOSIS — Z5112 Encounter for antineoplastic immunotherapy: Secondary | ICD-10-CM

## 2015-11-09 DIAGNOSIS — E7800 Pure hypercholesterolemia, unspecified: Secondary | ICD-10-CM | POA: Insufficient documentation

## 2015-11-09 DIAGNOSIS — Z9221 Personal history of antineoplastic chemotherapy: Secondary | ICD-10-CM | POA: Diagnosis not present

## 2015-11-09 DIAGNOSIS — Z720 Tobacco use: Secondary | ICD-10-CM

## 2015-11-09 DIAGNOSIS — G8929 Other chronic pain: Secondary | ICD-10-CM | POA: Diagnosis not present

## 2015-11-09 DIAGNOSIS — E876 Hypokalemia: Secondary | ICD-10-CM

## 2015-11-09 DIAGNOSIS — Z880 Allergy status to penicillin: Secondary | ICD-10-CM | POA: Diagnosis not present

## 2015-11-09 LAB — CBC WITH DIFFERENTIAL/PLATELET
BASOS ABS: 0 10*3/uL (ref 0.0–0.1)
BASOS PCT: 1 %
Eosinophils Absolute: 0.2 10*3/uL (ref 0.0–0.7)
Eosinophils Relative: 3 %
HEMATOCRIT: 41.9 % (ref 39.0–52.0)
HEMOGLOBIN: 14.7 g/dL (ref 13.0–17.0)
LYMPHS PCT: 26 %
Lymphs Abs: 1.6 10*3/uL (ref 0.7–4.0)
MCH: 33.6 pg (ref 26.0–34.0)
MCHC: 35.1 g/dL (ref 30.0–36.0)
MCV: 95.9 fL (ref 78.0–100.0)
MONOS PCT: 8 %
Monocytes Absolute: 0.5 10*3/uL (ref 0.1–1.0)
NEUTROS ABS: 4 10*3/uL (ref 1.7–7.7)
NEUTROS PCT: 62 %
Platelets: 158 10*3/uL (ref 150–400)
RBC: 4.37 MIL/uL (ref 4.22–5.81)
RDW: 12.7 % (ref 11.5–15.5)
WBC: 6.3 10*3/uL (ref 4.0–10.5)

## 2015-11-09 LAB — COMPREHENSIVE METABOLIC PANEL
ALBUMIN: 4 g/dL (ref 3.5–5.0)
ALK PHOS: 54 U/L (ref 38–126)
ALT: 25 U/L (ref 17–63)
AST: 19 U/L (ref 15–41)
Anion gap: 6 (ref 5–15)
BILIRUBIN TOTAL: 0.7 mg/dL (ref 0.3–1.2)
BUN: 14 mg/dL (ref 6–20)
CALCIUM: 9.3 mg/dL (ref 8.9–10.3)
CO2: 29 mmol/L (ref 22–32)
Chloride: 101 mmol/L (ref 101–111)
Creatinine, Ser: 0.91 mg/dL (ref 0.61–1.24)
GFR calc Af Amer: >60 mL/min (ref >60)
GFR calc non Af Amer: >60 mL/min (ref >60)
GLUCOSE: 154 mg/dL — AB (ref 65–99)
Potassium: 4.1 mmol/L (ref 3.5–5.1)
Sodium: 136 mmol/L (ref 135–145)
TOTAL PROTEIN: 7 g/dL (ref 6.5–8.1)

## 2015-11-09 MED ORDER — PROCHLORPERAZINE MALEATE 10 MG PO TABS: 10.0000 mg | ORAL_TABLET | Freq: Once | ORAL | Status: DC

## 2015-11-09 MED ORDER — BORTEZOMIB CHEMO SQ INJECTION 3.5 MG (2.5MG/ML)
1.3000 mg/m2 | Freq: Once | INTRAMUSCULAR | Status: AC
  Administered 2015-11-09: 2.75 mg via SUBCUTANEOUS
  Filled 2015-11-09: qty 2.75

## 2015-11-09 NOTE — Progress Notes (Signed)
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  Day 0= 10/24/2013.      02/22/2014 - 03/26/2014 Chemotherapy    Lenalidomide 10 mg daily initiated for one month, and tolerated we'll increase to 15 mg daily.  Toxicity resulting in discontinuation.      03/26/2014 Adverse Reaction    Diffuse, worsening rash, aching and bones, joints, muscles, weakness, lenalidomide discontinued      05/06/2014 -  Chemotherapy    Velcade 1.3 mg/m every 14 days as maintenance therapy.       HISTORY OF PRESENTING ILLNESS:  Craig Rivera 60 y.o. male is here for a follow-up of multiple myeloma, IgG kappa. He is s/p autologous transplant Day 0 10/24/2013.   Patient is doing well. He only has difficulty sleeping when he puts on a new patch, but he changes patches every 48 hours. He notes that overall his sleep is much improved on fentanyl compared to oral narcotics.   Patient states his pain is managed. He sees neurologist tomorrow for his spine.   He has had flu vaccination and his vaccines are up to date. He saw Dr. Ok Edwards at [facility] on 10/19/2015. He will follow-up with him again in one year.  He does not need any refills today.   MEDICAL HISTORY:  Past Medical History:  Diagnosis Date  . Back pain   . HTN (hypertension)     . Hypercholesterolemia   . Multiple myeloma ([place])   . Sciatic nerve pain     SURGICAL HISTORY: Past Surgical History:  Procedure Laterality Date  . BONE MARROW TRANSPLANT    . CATARACT EXTRACTION W/PHACO Right 08/11/2014   Procedure: CATARACT EXTRACTION PHACO AND INTRAOCULAR LENS PLACEMENT (IOC);  Surgeon: Williams Che, MD;  Location: [facility] ORS;  Service: Ophthalmology;  Laterality: Right;  CDE 4.88  . CATARACT EXTRACTION W/PHACO Left 10/26/2014   Procedure: CATARACT EXTRACTION PHACO AND INTRAOCULAR LENS PLACEMENT LEFT EYE CDE=8.64;  Surgeon: Williams Che, MD;  Location: [facility] ORS;  Service: Ophthalmology;  Laterality: Left;  . COLONOSCOPY  09/12/2010   Procedure: COLONOSCOPY;  Surgeon: Daneil Dolin, MD;  Location: [facility] ENDO SUITE;  Service: Endoscopy;  Laterality: N/A;  . inguinal hernia repair bilateral     as child   . KNEE SURGERY Right    arthroscopy  . TONSILLECTOMY      SOCIAL HISTORY: Social History   Social History  . Marital status: Married    Spouse name: N/A  . Number of children: N/A  . Years of education: N/A   Occupational History  . Not on file.   Social History Main Topics  . Smoking status: Current Every Day Smoker    Packs/day: 0.50    Years: 40.00    Types: Cigarettes  . Smokeless tobacco: Never Used  .  Alcohol use No  . Drug use: No  . Sexual activity: Yes    Birth control/ protection: None   Other Topics Concern  . Not on file   Social History Narrative  . No narrative on file   Married for 25 years 4 children 9 grandchildren 1 pet, Boxer named Grace Smoker, 1 pack every 2 or 3 days. Started at 60 yo. States, he quit for a while.  He used to play golf, however the myeloma fatigue has kept him from playing Worked [place] at [employer]: Family History  Problem Relation Age of Onset  . Colon polyps Father     passed away, [place]  . Colon polyps Sister   . Migraines Mother     deceased   Mother deceased  at 29 or 14 yo Father deceased at 33 yo  1 brother died to kidney disease in the 19s 1 sister passed away from [place] and a male cancer.  Oldest sister, 104 yo, is still living and has MS. Mostly healthy Neither parent had MS that he knows of. No family history of myeloma  ALLERGIES:  is allergic to penicillins; ceclor [cefaclor]; and lenalidomide.  MEDICATIONS:  Current Outpatient Prescriptions  Medication Sig Dispense Refill  . acetaminophen (TYLENOL) 325 MG tablet Take 650 mg by mouth every 6 (six) hours as needed for mild pain.     Marland Kitchen acyclovir (ZOVIRAX) 200 MG capsule Take 2 capsules (400 mg total) by mouth 2 (two) times daily. 120 capsule 2  . amLODipine (NORVASC) 10 MG tablet Take 10 mg by mouth daily.     . bortezomib IV (VELCADE) 3.5 MG injection Inject 1.3 mg/m2 into the vein every 14 (fourteen) days.    . calcium carbonate (OS-CAL) 600 MG TABS tablet Take 600 mg by mouth 2 (two) times daily with a meal.    . cephALEXin (KEFLEX) 500 MG capsule Take 1 capsule (500 mg total) by mouth 3 (three) times daily. 15 capsule 0  . fentaNYL (DURAGESIC - DOSED MCG/HR) 75 MCG/HR Place 1 patch (75 mcg total) onto the skin every 3 (three) days. Last patch 07/27/14 (Patient taking differently: Place 75 mcg onto the skin every other day. Last patch 07/27/14) 15 patch 0  . fluticasone (FLONASE) 50 MCG/ACT nasal spray Place 2 sprays into both nostrils daily as needed for allergies.     Marland Kitchen KLOR-CON M20 20 MEQ tablet Take 1 tablet by mouth 2 (two) times daily.  6  . naproxen sodium (ANAPROX) 220 MG tablet Take 220 mg by mouth 2 (two) times daily with a meal.    . oxyCODONE (OXY IR/ROXICODONE) 5 MG immediate release tablet Take 1 tablet (5 mg total) by mouth every 4 (four) hours as needed. pain 30 tablet 0  . polyethylene glycol (MIRALAX / GLYCOLAX) packet Take 17 g by mouth daily as needed for mild constipation.    . tamsulosin (FLOMAX) 0.4 MG CAPS capsule Take 1 capsule by mouth 2 (two) times daily.  5   No  current facility-administered medications for this visit.     Review of Systems  HENT: Negative.   Eyes: Negative.   Respiratory: Negative.  Negative for shortness of breath.   Cardiovascular: Negative.   Gastrointestinal: Negative.   Genitourinary: Negative.   Musculoskeletal: Positive for back pain.       Back pain well managed with oxycodone  Skin: Negative.   Neurological: Negative.   Endo/Heme/Allergies: Negative.   Psychiatric/Behavioral: Positive for depression. The patient has  insomnia.        Poor sleep associated with back pain. Depression secondary to not being able to do things he previously could and insomnia due to opioid patch  All other systems reviewed and are negative.  14 point ROS was done and is otherwise as detailed above or in HPI   PHYSICAL EXAMINATION: ECOG PERFORMANCE STATUS: 1 - Symptomatic but completely ambulatory    Vitals:   11/09/15 1344  BP: 113/75  Pulse: 88  Resp: 18  Temp: 98.1 F (36.7 C)   Filed Weights   11/09/15 1344  Weight: 213 lb 12.8 oz (97 kg)    Physical Exam  Constitutional: He is oriented to person, place, and time and well-developed, well-nourished, and in no distress.  HENT:  Head: Normocephalic and atraumatic.  Nose: Nose normal.  Mouth/Throat: Oropharynx is clear and moist. No oropharyngeal exudate.  Eyes: Conjunctivae and EOM are normal. Pupils are equal, round, and reactive to light. Right eye exhibits no discharge. Left eye exhibits no discharge. No scleral icterus.  Neck: Normal range of motion. Neck supple. No tracheal deviation present. No thyromegaly present.  Cardiovascular: Normal rate, regular rhythm and normal heart sounds.  Exam reveals no gallop and no friction rub.   No murmur heard. Pulmonary/Chest: Effort normal and breath sounds normal. He has no wheezes. He has no rales.  Abdominal: Soft. Bowel sounds are normal. He exhibits no distension and no mass. There is no tenderness. There is no rebound and  no guarding.  Musculoskeletal: Normal range of motion. He exhibits no edema.  Lymphadenopathy:    He has no cervical adenopathy.  Neurological: He is alert and oriented to person, place, and time. He has normal reflexes. No cranial nerve deficit. Gait normal. Coordination normal.  Skin: Skin is warm and dry. No rash noted.  LLE old injury from mowing incident.  Psychiatric: Mood, memory, affect and judgment normal.  Nursing note and vitals reviewed.    LABORATORY DATA:  I have reviewed the data as listed Lab Results  Component Value Date   WBC 6.3 11/09/2015   HGB 14.7 11/09/2015   HCT 41.9 11/09/2015   MCV 95.9 11/09/2015   PLT 158 11/09/2015   CMP     Component Value Date/Time   NA 136 11/09/2015 1304   K 4.1 11/09/2015 1304   CL 101 11/09/2015 1304   CO2 29 11/09/2015 1304   GLUCOSE 154 (H) 11/09/2015 1304   BUN 14 11/09/2015 1304   CREATININE 0.91 11/09/2015 1304   CALCIUM 9.3 11/09/2015 1304   PROT 7.0 11/09/2015 1304   ALBUMIN 4.0 11/09/2015 1304   AST 19 11/09/2015 1304   ALT 25 11/09/2015 1304   ALKPHOS 54 11/09/2015 1304   BILITOT 0.7 11/09/2015 1304   GFRNONAA >60 11/09/2015 1304   GFRAA >60 11/09/2015 1304     ASSESSMENT & PLAN:  Multiple myeloma, IgG kappa Bone marrow transplant at [facility] on 10/24/2013 Maintenance Velcade Tobacco Use Insomnia Chronic Pain   He is currently on maintenance velcade, He will complete 2 years of maintenance (J Clin Oncol 2012 Aug 20;30 (24); 2946-55) in December.  He is to continue with ongoing follow-up at [facility] as recommended.   I reviewed the patients labs with him, the results are noted in this note.   Smoking cessation was addressed again today.   Pain is managed currently with his pain regimen. He would like to continue with it unchanged.   I spoke with the patient about the support groups  which are offered here at [facility]. I have encouraged him to join one of these groups to help his mood, especially the group  Finding Your New Normal. He is not interested at this time.  His potassium is 4.2 today. I recommended patient hold off on taking potassium until next visit. If his levels are still stable, we can discuss stopping potassium altogether. If not, we can restart it.   He is to continue his acyclovir, he inquired if he could discontinue this.   Follow up with patient in 3 months.  Orders Placed This Encounter  Procedures  . CBC with Differential    Standing Status:   Future    Standing Expiration Date:   11/08/2016  . Comprehensive metabolic panel    Standing Status:   Future    Standing Expiration Date:   11/08/2016  . Protein electrophoresis, serum    Standing Status:   Future    Standing Expiration Date:   11/08/2016  . Immunofixation electrophoresis    Standing Status:   Future    Standing Expiration Date:   11/08/2016  . Kappa/lambda light chains    Standing Status:   Future    Standing Expiration Date:   11/08/2016    All questions were answered. The patient knows to call the clinic with any problems, questions or concerns.  This document serves as a record of services personally performed by Ancil Linsey, MD. It was created on her behalf by Elmyra Ricks, a trained medical scribe. The creation of this record is based on the scribe's personal observations and the provider's statements to them. This document has been checked and approved by the attending provider.  I have reviewed the above documentation for accuracy and completeness, and I agree with the above.  This note was electronically signed.    Orders Placed This Encounter  Procedures  . CBC with Differential    Standing Status:   Future    Standing Expiration Date:   11/08/2016  . Comprehensive metabolic panel    Standing Status:   Future    Standing Expiration Date:   11/08/2016  . Protein electrophoresis, serum    Standing Status:   Future    Standing Expiration Date:   11/08/2016  . Immunofixation electrophoresis     Standing Status:   Future    Standing Expiration Date:   11/08/2016  . Kappa/lambda light chains    Standing Status:   Future    Standing Expiration Date:   11/08/2016   Molli Hazard, MD  11/09/2015 1:55 PM

## 2015-11-09 NOTE — Telephone Encounter (Signed)
-----   Message from Baird Cancer, PA-C sent at 11/09/2015  4:27 PM EDT ----- Stable

## 2015-11-09 NOTE — Progress Notes (Signed)
Craig Rivera presents today for injection per MD orders. Velcade administered SQ in right Abdomen. Administration without incident. Patient tolerated well. Patient ambulatory and stable upon discharge from clinic.

## 2015-11-09 NOTE — Telephone Encounter (Signed)
LMOM that labs are stable.   

## 2015-11-09 NOTE — Patient Instructions (Signed)
[facility] Discharge Instructions for Patients Receiving Chemotherapy   Beginning January 23rd 2017 lab work for the Cancer Center will be done in the  Main lab at  on 1st floor. If you have a lab appointment with the Cancer Center please come in thru the  Main Entrance and check in at the main information desk   Today you received the following chemotherapy agent: Velcade.     If you develop nausea and vomiting, or diarrhea that is not controlled by your medication, call the clinic.  The clinic phone number is [phone]. Office hours are Monday-Friday 8:30am-5:00pm.  BELOW ARE SYMPTOMS THAT SHOULD BE REPORTED IMMEDIATELY:  *FEVER GREATER THAN 101.0 F  *CHILLS WITH OR WITHOUT FEVER  NAUSEA AND VOMITING THAT IS NOT CONTROLLED WITH YOUR NAUSEA MEDICATION  *UNUSUAL SHORTNESS OF BREATH  *UNUSUAL BRUISING OR BLEEDING  TENDERNESS IN MOUTH AND THROAT WITH OR WITHOUT PRESENCE OF ULCERS  *URINARY PROBLEMS  *BOWEL PROBLEMS  UNUSUAL RASH Items with * indicate a potential emergency and should be followed up as soon as possible. If you have an emergency after office hours please contact your primary care physician or go to the nearest emergency department.  Please call the clinic during office hours if you have any questions or concerns.   You may also contact the Patient Navigator at [phone] should you have any questions or need assistance in obtaining follow up care.      Resources For Cancer Patients and their Caregivers ? American Cancer Society: Can assist with transportation, wigs, general needs, runs Look Good Feel Better.        1-888-227-6333 ? Cancer Care: Provides financial assistance, online support groups, medication/co-pay assistance.  1-800-813-HOPE (4673) ? [facility] Assists [place] cancer patients and their families through emotional , educational and financial support.   336-427-4357 ? [place] DSS Where to apply for food stamps, Medicaid and utility assistance. 336-342-1394 ? [employer]: Transportation to medical appointments. 336-347-2287 ? Social Security Administration: May apply for disability if have a Stage IV cancer. 336-342-7796 1-800-772-1213 ? [place] Aging, Disability and Transit Services: Assists with nutrition, care and transit needs. 336-349-2343          

## 2015-11-09 NOTE — Patient Instructions (Addendum)
[place] at [facility] Discharge Instructions  RECOMMENDATIONS MADE BY THE CONSULTANT AND ANY TEST RESULTS WILL BE SENT TO YOUR REFERRING PHYSICIAN.  You saw Dr. Whitney Muse today. Follow up around 12/11 for MD appt, chemo and labs.  Thank you for choosing [place] at [facility] to provide your oncology and hematology care.  To afford each patient quality time with our provider, please arrive at least 15 minutes before your scheduled appointment time.   Beginning January 23rd 2017 lab work for the [facility] will be done in the  Main lab at [facility] on 1st floor. If you have a lab appointment with the [facility] please come in thru the  Main Entrance and check in at the main information desk  You need to re-schedule your appointment should you arrive 10 or more minutes late.  We strive to give you quality time with our providers, and arriving late affects you and other patients whose appointments are after yours.  Also, if you no show three or more times for appointments you may be dismissed from the clinic at the providers discretion.     Again, thank you for choosing [facility].  Our hope is that these requests will decrease the amount of time that you wait before being seen by our physicians.       _____________________________________________________________  Should you have questions after your visit to [facility], please contact our office at [phone] between the hours of 8:30 a.m. and 4:30 p.m.  Voicemails left after 4:30 p.m. will not be returned until the following business day.  For prescription refill requests, have your pharmacy contact our office.         Resources For Cancer Patients and their Caregivers ? American Cancer Society: Can assist with transportation, wigs, general needs, runs Look Good Feel Better.        ([phone] ? Cancer Care: Provides financial assistance, online  support groups, medication/co-pay assistance.  1-800-813-HOPE 804-762-8554) ? [place] Assists [place] cancer patients and their families through emotional , educational and financial support.  602-796-7923 ? [place] DSS Where to apply for food stamps, Medicaid and utility assistance. (904)653-0591 ? RCATS: Transportation to medical appointments. (317) 485-5840 ? Social Security Administration: May apply for disability if have a Stage IV cancer. ([phone] 717-670-1861 ? [employer], Disability and Transit Services: Assists with nutrition, care and transit needs. [place] Support Programs: @10RELATIVEDAYS @ > Cancer Support Group  2nd Tuesday of the month 1pm-2pm, Journey Room  > Creative Journey  3rd Tuesday of the month 1130am-1pm, Journey Room  > Look Good Feel Better  1st Wednesday of the month 10am-12 noon, Journey Room (Call [place] to register [phone])

## 2015-11-16 ENCOUNTER — Ambulatory Visit (HOSPITAL_COMMUNITY): Payer: Self-pay | Admitting: Hematology & Oncology

## 2015-11-22 ENCOUNTER — Encounter (HOSPITAL_COMMUNITY): Payer: 59

## 2015-11-22 ENCOUNTER — Encounter (HOSPITAL_BASED_OUTPATIENT_CLINIC_OR_DEPARTMENT_OTHER): Payer: 59

## 2015-11-22 ENCOUNTER — Other Ambulatory Visit (HOSPITAL_COMMUNITY): Payer: Self-pay | Admitting: Pharmacist

## 2015-11-22 VITALS — BP 124/77 | HR 58 | Temp 98.1°F | Resp 16

## 2015-11-22 DIAGNOSIS — C9000 Multiple myeloma not having achieved remission: Secondary | ICD-10-CM

## 2015-11-22 DIAGNOSIS — Z5112 Encounter for antineoplastic immunotherapy: Secondary | ICD-10-CM

## 2015-11-22 LAB — COMPREHENSIVE METABOLIC PANEL
ALK PHOS: 51 U/L (ref 38–126)
ALT: 26 U/L (ref 17–63)
ANION GAP: 7 (ref 5–15)
AST: 22 U/L (ref 15–41)
Albumin: 4.1 g/dL (ref 3.5–5.0)
BILIRUBIN TOTAL: 0.9 mg/dL (ref 0.3–1.2)
BUN: 12 mg/dL (ref 6–20)
CALCIUM: 9.2 mg/dL (ref 8.9–10.3)
CO2: 26 mmol/L (ref 22–32)
CREATININE: 0.85 mg/dL (ref 0.61–1.24)
Chloride: 104 mmol/L (ref 101–111)
Glucose, Bld: 119 mg/dL — ABNORMAL HIGH (ref 65–99)
Potassium: 3.7 mmol/L (ref 3.5–5.1)
Sodium: 137 mmol/L (ref 135–145)
TOTAL PROTEIN: 7 g/dL (ref 6.5–8.1)

## 2015-11-22 LAB — CBC WITH DIFFERENTIAL/PLATELET
Basophils Absolute: 0 10*3/uL (ref 0.0–0.1)
Basophils Relative: 1 %
Eosinophils Absolute: 0.2 10*3/uL (ref 0.0–0.7)
Eosinophils Relative: 3 %
HEMATOCRIT: 42.6 % (ref 39.0–52.0)
HEMOGLOBIN: 15.2 g/dL (ref 13.0–17.0)
LYMPHS ABS: 1.9 10*3/uL (ref 0.7–4.0)
LYMPHS PCT: 30 %
MCH: 33.8 pg (ref 26.0–34.0)
MCHC: 35.7 g/dL (ref 30.0–36.0)
MCV: 94.7 fL (ref 78.0–100.0)
MONOS PCT: 9 %
Monocytes Absolute: 0.6 10*3/uL (ref 0.1–1.0)
NEUTROS PCT: 57 %
Neutro Abs: 3.8 10*3/uL (ref 1.7–7.7)
Platelets: 146 10*3/uL — ABNORMAL LOW (ref 150–400)
RBC: 4.5 MIL/uL (ref 4.22–5.81)
RDW: 12.7 % (ref 11.5–15.5)
WBC: 6.4 10*3/uL (ref 4.0–10.5)

## 2015-11-22 MED ORDER — BORTEZOMIB CHEMO SQ INJECTION 3.5 MG (2.5MG/ML)
1.3000 mg/m2 | Freq: Once | INTRAMUSCULAR | Status: AC
  Administered 2015-11-22: 2.75 mg via SUBCUTANEOUS
  Filled 2015-11-22: qty 2.75

## 2015-11-22 MED ORDER — PROCHLORPERAZINE MALEATE 10 MG PO TABS: 10.0000 mg | ORAL_TABLET | Freq: Once | ORAL | Status: DC

## 2015-11-22 NOTE — Progress Notes (Signed)
Craig Rivera presents today for injection per MD orders. Velcade administered SQ in left Abdomen. Administration without incident. Patient tolerated well.

## 2015-11-22 NOTE — Patient Instructions (Signed)
[facility] Discharge Instructions for Patients Receiving Chemotherapy   Beginning January 23rd 2017 lab work for the Cancer Center will be done in the  Main lab at [facility] on 1st floor. If you have a lab appointment with the Cancer Center please come in thru the  Main Entrance and check in at the main information desk   Today you received the following chemotherapy agent: Velcade.     If you develop nausea and vomiting, or diarrhea that is not controlled by your medication, call the clinic.  The clinic phone number is [phone]. Office hours are Monday-Friday 8:30am-5:00pm.  BELOW ARE SYMPTOMS THAT SHOULD BE REPORTED IMMEDIATELY:  *FEVER GREATER THAN 101.0 F  *CHILLS WITH OR WITHOUT FEVER  NAUSEA AND VOMITING THAT IS NOT CONTROLLED WITH YOUR NAUSEA MEDICATION  *UNUSUAL SHORTNESS OF BREATH  *UNUSUAL BRUISING OR BLEEDING  TENDERNESS IN MOUTH AND THROAT WITH OR WITHOUT PRESENCE OF ULCERS  *URINARY PROBLEMS  *BOWEL PROBLEMS  UNUSUAL RASH Items with * indicate a potential emergency and should be followed up as soon as possible. If you have an emergency after office hours please contact your primary care physician or go to the nearest emergency department.  Please call the clinic during office hours if you have any questions or concerns.   You may also contact the Patient Navigator at [phone] should you have any questions or need assistance in obtaining follow up care.      Resources For Cancer Patients and their Caregivers ? American Cancer Society: Can assist with transportation, wigs, general needs, runs Look Good Feel Better.        1-888-227-6333 ? Cancer Care: Provides financial assistance, online support groups, medication/co-pay assistance.  1-800-813-HOPE (4673) ? [facility] Assists [place] cancer patients and their families through emotional , educational and financial support.   336-427-4357 ? [place] DSS Where to apply for food stamps, Medicaid and utility assistance. 336-342-1394 ? RCATS: Transportation to medical appointments. 336-347-2287 ? Social Security Administration: May apply for disability if have a Stage IV cancer. 336-342-7796 1-800-772-1213 ? [place] Aging, Disability and Transit Services: Assists with nutrition, care and transit needs. [phone]          

## 2015-11-28 ENCOUNTER — Other Ambulatory Visit (HOSPITAL_COMMUNITY): Payer: Self-pay | Admitting: Oncology

## 2015-12-06 ENCOUNTER — Encounter (HOSPITAL_BASED_OUTPATIENT_CLINIC_OR_DEPARTMENT_OTHER): Payer: 59

## 2015-12-06 ENCOUNTER — Encounter (HOSPITAL_COMMUNITY): Payer: 59

## 2015-12-06 ENCOUNTER — Encounter (HOSPITAL_COMMUNITY): Payer: Self-pay

## 2015-12-06 VITALS — BP 152/95 | HR 70 | Temp 98.7°F | Resp 18

## 2015-12-06 DIAGNOSIS — C9000 Multiple myeloma not having achieved remission: Secondary | ICD-10-CM

## 2015-12-06 DIAGNOSIS — Z5112 Encounter for antineoplastic immunotherapy: Secondary | ICD-10-CM

## 2015-12-06 LAB — COMPREHENSIVE METABOLIC PANEL
ALT: 30 U/L (ref 17–63)
AST: 22 U/L (ref 15–41)
Albumin: 4.1 g/dL (ref 3.5–5.0)
Alkaline Phosphatase: 51 U/L (ref 38–126)
Anion gap: 6 (ref 5–15)
BILIRUBIN TOTAL: 0.6 mg/dL (ref 0.3–1.2)
BUN: 13 mg/dL (ref 6–20)
CALCIUM: 9.2 mg/dL (ref 8.9–10.3)
CO2: 27 mmol/L (ref 22–32)
CREATININE: 0.81 mg/dL (ref 0.61–1.24)
Chloride: 102 mmol/L (ref 101–111)
GFR calc Af Amer: >60 mL/min (ref >60)
Glucose, Bld: 126 mg/dL — ABNORMAL HIGH (ref 65–99)
Potassium: 3.7 mmol/L (ref 3.5–5.1)
Sodium: 135 mmol/L (ref 135–145)
TOTAL PROTEIN: 7.1 g/dL (ref 6.5–8.1)

## 2015-12-06 LAB — CBC WITH DIFFERENTIAL/PLATELET
BASOS ABS: 0 10*3/uL (ref 0.0–0.1)
Basophils Relative: 0 %
Eosinophils Absolute: 0.1 10*3/uL (ref 0.0–0.7)
Eosinophils Relative: 2 %
HEMATOCRIT: 43.8 % (ref 39.0–52.0)
Hemoglobin: 15.8 g/dL (ref 13.0–17.0)
LYMPHS ABS: 1.8 10*3/uL (ref 0.7–4.0)
LYMPHS PCT: 24 %
MCH: 34.1 pg — ABNORMAL HIGH (ref 26.0–34.0)
MCHC: 36.1 g/dL — ABNORMAL HIGH (ref 30.0–36.0)
MCV: 94.4 fL (ref 78.0–100.0)
MONO ABS: 0.5 10*3/uL (ref 0.1–1.0)
Monocytes Relative: 7 %
NEUTROS ABS: 5 10*3/uL (ref 1.7–7.7)
Neutrophils Relative %: 67 %
Platelets: 148 10*3/uL — ABNORMAL LOW (ref 150–400)
RBC: 4.64 MIL/uL (ref 4.22–5.81)
RDW: 12.8 % (ref 11.5–15.5)
WBC: 7.5 10*3/uL (ref 4.0–10.5)

## 2015-12-06 MED ORDER — FENTANYL 75 MCG/HR TD PT72: 75.0000 ug | MEDICATED_PATCH | TRANSDERMAL | 0 refills | Status: DC

## 2015-12-06 MED ORDER — BORTEZOMIB CHEMO SQ INJECTION 3.5 MG (2.5MG/ML)
1.3000 mg/m2 | Freq: Once | INTRAMUSCULAR | Status: AC
  Administered 2015-12-06: 2.75 mg via SUBCUTANEOUS
  Filled 2015-12-06: qty 2.75

## 2015-12-06 NOTE — Progress Notes (Signed)
Velcade injection given today per orders. Patient is tolerating injections well, no new problems. Vitals stable and discharged ambulatory from clinic.follow up as scheduled.

## 2015-12-10 ENCOUNTER — Other Ambulatory Visit (HOSPITAL_COMMUNITY): Payer: Self-pay | Admitting: Emergency Medicine

## 2015-12-10 MED ORDER — OXYCODONE HCL 5 MG PO TABS: 5.0000 mg | ORAL_TABLET | ORAL | 0 refills | Status: DC | PRN

## 2015-12-10 NOTE — Progress Notes (Unsigned)
Oxycodone refilled.

## 2015-12-20 ENCOUNTER — Encounter (HOSPITAL_COMMUNITY): Payer: 59 | Attending: Hematology & Oncology

## 2015-12-20 ENCOUNTER — Encounter (HOSPITAL_COMMUNITY): Payer: Self-pay

## 2015-12-20 ENCOUNTER — Encounter (HOSPITAL_COMMUNITY): Payer: 59

## 2015-12-20 VITALS — BP 135/91 | HR 72 | Temp 98.2°F | Resp 18 | Wt 215.4 lb

## 2015-12-20 DIAGNOSIS — C9000 Multiple myeloma not having achieved remission: Secondary | ICD-10-CM

## 2015-12-20 DIAGNOSIS — Z5112 Encounter for antineoplastic immunotherapy: Secondary | ICD-10-CM

## 2015-12-20 LAB — CBC WITH DIFFERENTIAL/PLATELET
BASOS ABS: 0 10*3/uL (ref 0.0–0.1)
BASOS PCT: 0 %
EOS PCT: 2 %
Eosinophils Absolute: 0.2 10*3/uL (ref 0.0–0.7)
HEMATOCRIT: 44.5 % (ref 39.0–52.0)
Hemoglobin: 15.8 g/dL (ref 13.0–17.0)
LYMPHS PCT: 25 %
Lymphs Abs: 2 10*3/uL (ref 0.7–4.0)
MCH: 33.8 pg (ref 26.0–34.0)
MCHC: 35.5 g/dL (ref 30.0–36.0)
MCV: 95.3 fL (ref 78.0–100.0)
Monocytes Absolute: 0.7 10*3/uL (ref 0.1–1.0)
Monocytes Relative: 9 %
NEUTROS ABS: 5.3 10*3/uL (ref 1.7–7.7)
Neutrophils Relative %: 64 %
PLATELETS: 161 10*3/uL (ref 150–400)
RBC: 4.67 MIL/uL (ref 4.22–5.81)
RDW: 13 % (ref 11.5–15.5)
WBC: 8.2 10*3/uL (ref 4.0–10.5)

## 2015-12-20 LAB — COMPREHENSIVE METABOLIC PANEL
ALBUMIN: 4.3 g/dL (ref 3.5–5.0)
ALT: 28 U/L (ref 17–63)
AST: 19 U/L (ref 15–41)
Alkaline Phosphatase: 58 U/L (ref 38–126)
Anion gap: 6 (ref 5–15)
BUN: 17 mg/dL (ref 6–20)
CHLORIDE: 102 mmol/L (ref 101–111)
CO2: 29 mmol/L (ref 22–32)
CREATININE: 0.79 mg/dL (ref 0.61–1.24)
Calcium: 9.5 mg/dL (ref 8.9–10.3)
GFR calc Af Amer: >60 mL/min (ref >60)
GLUCOSE: 112 mg/dL — AB (ref 65–99)
Potassium: 3.8 mmol/L (ref 3.5–5.1)
Sodium: 137 mmol/L (ref 135–145)
Total Bilirubin: 0.7 mg/dL (ref 0.3–1.2)
Total Protein: 7.4 g/dL (ref 6.5–8.1)

## 2015-12-20 MED ORDER — BORTEZOMIB CHEMO SQ INJECTION 3.5 MG (2.5MG/ML)
1.3000 mg/m2 | Freq: Once | INTRAMUSCULAR | Status: AC
  Administered 2015-12-20: 2.75 mg via SUBCUTANEOUS
  Filled 2015-12-20: qty 2.75

## 2015-12-20 NOTE — Patient Instructions (Signed)
[facility] Discharge Instructions for Patients Receiving Chemotherapy   Beginning January 23rd 2017 lab work for the Cancer Center will be done in the  Main lab at [facility] on 1st floor. If you have a lab appointment with the Cancer Center please come in thru the  Main Entrance and check in at the main information desk   Today you received the following chemotherapy agents velcade  To help prevent nausea and vomiting after your treatment, we encourage you to take your nausea medication     If you develop nausea and vomiting, or diarrhea that is not controlled by your medication, call the clinic.  The clinic phone number is [phone]. Office hours are Monday-Friday 8:30am-5:00pm.  BELOW ARE SYMPTOMS THAT SHOULD BE REPORTED IMMEDIATELY:  *FEVER GREATER THAN 101.0 F  *CHILLS WITH OR WITHOUT FEVER  NAUSEA AND VOMITING THAT IS NOT CONTROLLED WITH YOUR NAUSEA MEDICATION  *UNUSUAL SHORTNESS OF BREATH  *UNUSUAL BRUISING OR BLEEDING  TENDERNESS IN MOUTH AND THROAT WITH OR WITHOUT PRESENCE OF ULCERS  *URINARY PROBLEMS  *BOWEL PROBLEMS  UNUSUAL RASH Items with * indicate a potential emergency and should be followed up as soon as possible. If you have an emergency after office hours please contact your primary care physician or go to the nearest emergency department.  Please call the clinic during office hours if you have any questions or concerns.   You may also contact the Patient Navigator at [phone] should you have any questions or need assistance in obtaining follow up care.      Resources For Cancer Patients and their Caregivers ? American Cancer Society: Can assist with transportation, wigs, general needs, runs Look Good Feel Better.        1-888-227-6333 ? Cancer Care: Provides financial assistance, online support groups, medication/co-pay assistance.  1-800-813-HOPE (4673) ? [facility] Assists [place]  cancer patients and their families through emotional , educational and financial support.  336-427-4357 ? [place] DSS Where to apply for food stamps, Medicaid and utility assistance. 336-342-1394 ? RCATS: Transportation to medical appointments. 336-347-2287 ? Social Security Administration: May apply for disability if have a Stage IV cancer. 336-342-7796 1-800-772-1213 ? [place] Aging, Disability and Transit Services: Assists with nutrition, care and transit needs. [phone]         

## 2015-12-20 NOTE — Progress Notes (Signed)
Chemotherapy injection given today per orders. Patient had no complaints regarding velcade injection. Vitals stable and discharged from clinic ambulatory.

## 2016-01-03 ENCOUNTER — Encounter (HOSPITAL_COMMUNITY): Payer: 59

## 2016-01-03 ENCOUNTER — Encounter (HOSPITAL_BASED_OUTPATIENT_CLINIC_OR_DEPARTMENT_OTHER): Payer: 59

## 2016-01-03 VITALS — BP 143/86 | HR 67 | Temp 98.4°F | Resp 18

## 2016-01-03 DIAGNOSIS — Z5112 Encounter for antineoplastic immunotherapy: Secondary | ICD-10-CM

## 2016-01-03 DIAGNOSIS — C9000 Multiple myeloma not having achieved remission: Secondary | ICD-10-CM

## 2016-01-03 LAB — CBC WITH DIFFERENTIAL/PLATELET
Basophils Absolute: 0 10*3/uL (ref 0.0–0.1)
Basophils Relative: 0 %
Eosinophils Absolute: 0.2 10*3/uL (ref 0.0–0.7)
Eosinophils Relative: 2 %
HEMATOCRIT: 42.7 % (ref 39.0–52.0)
Hemoglobin: 15 g/dL (ref 13.0–17.0)
LYMPHS PCT: 27 %
Lymphs Abs: 1.9 10*3/uL (ref 0.7–4.0)
MCH: 33.6 pg (ref 26.0–34.0)
MCHC: 35.1 g/dL (ref 30.0–36.0)
MCV: 95.5 fL (ref 78.0–100.0)
MONO ABS: 0.7 10*3/uL (ref 0.1–1.0)
MONOS PCT: 9 %
NEUTROS ABS: 4.3 10*3/uL (ref 1.7–7.7)
Neutrophils Relative %: 62 %
Platelets: 142 10*3/uL — ABNORMAL LOW (ref 150–400)
RBC: 4.47 MIL/uL (ref 4.22–5.81)
RDW: 13.3 % (ref 11.5–15.5)
WBC: 7.1 10*3/uL (ref 4.0–10.5)

## 2016-01-03 LAB — COMPREHENSIVE METABOLIC PANEL
ALT: 25 U/L (ref 17–63)
ANION GAP: 7 (ref 5–15)
AST: 20 U/L (ref 15–41)
Albumin: 4.3 g/dL (ref 3.5–5.0)
Alkaline Phosphatase: 58 U/L (ref 38–126)
BILIRUBIN TOTAL: 0.6 mg/dL (ref 0.3–1.2)
BUN: 15 mg/dL (ref 6–20)
CO2: 29 mmol/L (ref 22–32)
Calcium: 9.7 mg/dL (ref 8.9–10.3)
Chloride: 100 mmol/L — ABNORMAL LOW (ref 101–111)
Creatinine, Ser: 0.8 mg/dL (ref 0.61–1.24)
Glucose, Bld: 105 mg/dL — ABNORMAL HIGH (ref 65–99)
POTASSIUM: 4 mmol/L (ref 3.5–5.1)
Sodium: 136 mmol/L (ref 135–145)
TOTAL PROTEIN: 7.3 g/dL (ref 6.5–8.1)

## 2016-01-03 MED ORDER — BORTEZOMIB CHEMO SQ INJECTION 3.5 MG (2.5MG/ML)
1.3000 mg/m2 | Freq: Once | INTRAMUSCULAR | Status: AC
  Administered 2016-01-03: 2.75 mg via SUBCUTANEOUS
  Filled 2016-01-03: qty 2.75

## 2016-01-03 MED ORDER — FENTANYL 75 MCG/HR TD PT72: 75.0000 ug | MEDICATED_PATCH | TRANSDERMAL | 0 refills | Status: DC

## 2016-01-03 MED ORDER — OXYCODONE HCL 5 MG PO TABS: 5.0000 mg | ORAL_TABLET | ORAL | 0 refills | Status: DC | PRN

## 2016-01-03 MED ORDER — PROCHLORPERAZINE MALEATE 10 MG PO TABS: 10.0000 mg | ORAL_TABLET | Freq: Once | ORAL | Status: DC

## 2016-01-03 NOTE — Patient Instructions (Signed)
[facility] Discharge Instructions for Patients Receiving Chemotherapy   Beginning January 23rd 2017 lab work for the Cancer Center will be done in the  Main lab at [facility] on 1st floor. If you have a lab appointment with the Cancer Center please come in thru the  Main Entrance and check in at the main information desk   Today you received the following chemotherapy agents Velcade injection. Follow-up as scheduled. Call clinic for any questions or concerns  To help prevent nausea and vomiting after your treatment, we encourage you to take your nausea medication   If you develop nausea and vomiting, or diarrhea that is not controlled by your medication, call the clinic.  The clinic phone number is [phone]. Office hours are Monday-Friday 8:30am-5:00pm.  BELOW ARE SYMPTOMS THAT SHOULD BE REPORTED IMMEDIATELY:  *FEVER GREATER THAN 101.0 F  *CHILLS WITH OR WITHOUT FEVER  NAUSEA AND VOMITING THAT IS NOT CONTROLLED WITH YOUR NAUSEA MEDICATION  *UNUSUAL SHORTNESS OF BREATH  *UNUSUAL BRUISING OR BLEEDING  TENDERNESS IN MOUTH AND THROAT WITH OR WITHOUT PRESENCE OF ULCERS  *URINARY PROBLEMS  *BOWEL PROBLEMS  UNUSUAL RASH Items with * indicate a potential emergency and should be followed up as soon as possible. If you have an emergency after office hours please contact your primary care physician or go to the nearest emergency department.  Please call the clinic during office hours if you have any questions or concerns.   You may also contact the Patient Navigator at [phone] should you have any questions or need assistance in obtaining follow up care.      Resources For Cancer Patients and their Caregivers ? American Cancer Society: Can assist with transportation, wigs, general needs, runs Look Good Feel Better.        1-888-227-6333 ? Cancer Care: Provides financial assistance, online support groups, medication/co-pay assistance.   1-800-813-HOPE (4673) ? [facility] Assists [place] cancer patients and their families through emotional , educational and financial support.  336-427-4357 ? [place] DSS Where to apply for food stamps, Medicaid and utility assistance. 336-342-1394 ? RCATS: Transportation to medical appointments. 336-347-2287 ? Social Security Administration: May apply for disability if have a Stage IV cancer. 336-342-7796 1-800-772-1213 ? [place] Aging, Disability and Transit Services: Assists with nutrition, care and transit needs. [phone]         

## 2016-01-03 NOTE — Progress Notes (Signed)
Craig Rivera tolerated Velcade injection well without complaints or incident. Labs reviewed prior to administering Velcade. VSS Pt discharged self ambulatory in satisfactory condition

## 2016-01-17 ENCOUNTER — Encounter (HOSPITAL_COMMUNITY): Payer: 59

## 2016-01-17 ENCOUNTER — Encounter (HOSPITAL_COMMUNITY): Payer: Self-pay | Admitting: Oncology

## 2016-01-17 ENCOUNTER — Encounter (HOSPITAL_BASED_OUTPATIENT_CLINIC_OR_DEPARTMENT_OTHER): Payer: 59

## 2016-01-17 ENCOUNTER — Encounter (HOSPITAL_COMMUNITY): Payer: 59 | Attending: Oncology | Admitting: Oncology

## 2016-01-17 VITALS — BP 140/81 | HR 59 | Temp 98.5°F | Resp 16 | Ht 71.0 in | Wt 217.0 lb

## 2016-01-17 DIAGNOSIS — I10 Essential (primary) hypertension: Secondary | ICD-10-CM | POA: Diagnosis not present

## 2016-01-17 DIAGNOSIS — Z8371 Family history of colonic polyps: Secondary | ICD-10-CM | POA: Insufficient documentation

## 2016-01-17 DIAGNOSIS — Z9889 Other specified postprocedural states: Secondary | ICD-10-CM | POA: Insufficient documentation

## 2016-01-17 DIAGNOSIS — Z888 Allergy status to other drugs, medicaments and biological substances status: Secondary | ICD-10-CM | POA: Diagnosis not present

## 2016-01-17 DIAGNOSIS — Z720 Tobacco use: Secondary | ICD-10-CM

## 2016-01-17 DIAGNOSIS — Z880 Allergy status to penicillin: Secondary | ICD-10-CM | POA: Insufficient documentation

## 2016-01-17 DIAGNOSIS — R200 Anesthesia of skin: Secondary | ICD-10-CM | POA: Insufficient documentation

## 2016-01-17 DIAGNOSIS — R42 Dizziness and giddiness: Secondary | ICD-10-CM | POA: Insufficient documentation

## 2016-01-17 DIAGNOSIS — G4700 Insomnia, unspecified: Secondary | ICD-10-CM | POA: Insufficient documentation

## 2016-01-17 DIAGNOSIS — E7800 Pure hypercholesterolemia, unspecified: Secondary | ICD-10-CM | POA: Insufficient documentation

## 2016-01-17 DIAGNOSIS — C9000 Multiple myeloma not having achieved remission: Secondary | ICD-10-CM

## 2016-01-17 DIAGNOSIS — Z5112 Encounter for antineoplastic immunotherapy: Secondary | ICD-10-CM | POA: Diagnosis not present

## 2016-01-17 DIAGNOSIS — G8929 Other chronic pain: Secondary | ICD-10-CM | POA: Diagnosis not present

## 2016-01-17 DIAGNOSIS — Z9221 Personal history of antineoplastic chemotherapy: Secondary | ICD-10-CM | POA: Insufficient documentation

## 2016-01-17 DIAGNOSIS — F17210 Nicotine dependence, cigarettes, uncomplicated: Secondary | ICD-10-CM | POA: Insufficient documentation

## 2016-01-17 DIAGNOSIS — Z9481 Bone marrow transplant status: Secondary | ICD-10-CM

## 2016-01-17 DIAGNOSIS — Z79899 Other long term (current) drug therapy: Secondary | ICD-10-CM | POA: Diagnosis not present

## 2016-01-17 LAB — COMPREHENSIVE METABOLIC PANEL
ALBUMIN: 3.9 g/dL (ref 3.5–5.0)
ALK PHOS: 61 U/L (ref 38–126)
ALT: 27 U/L (ref 17–63)
AST: 22 U/L (ref 15–41)
Anion gap: 7 (ref 5–15)
BUN: 11 mg/dL (ref 6–20)
CALCIUM: 9.1 mg/dL (ref 8.9–10.3)
CO2: 26 mmol/L (ref 22–32)
CREATININE: 0.81 mg/dL (ref 0.61–1.24)
Chloride: 102 mmol/L (ref 101–111)
GFR calc Af Amer: >60 mL/min (ref >60)
GFR calc non Af Amer: >60 mL/min (ref >60)
GLUCOSE: 129 mg/dL — AB (ref 65–99)
Potassium: 3.6 mmol/L (ref 3.5–5.1)
SODIUM: 135 mmol/L (ref 135–145)
Total Bilirubin: 0.9 mg/dL (ref 0.3–1.2)
Total Protein: 6.8 g/dL (ref 6.5–8.1)

## 2016-01-17 LAB — CBC WITH DIFFERENTIAL/PLATELET
BASOS PCT: 0 %
Basophils Absolute: 0 10*3/uL (ref 0.0–0.1)
Eosinophils Absolute: 0.2 10*3/uL (ref 0.0–0.7)
Eosinophils Relative: 3 %
HCT: 42.4 % (ref 39.0–52.0)
HEMOGLOBIN: 15.3 g/dL (ref 13.0–17.0)
LYMPHS ABS: 1.2 10*3/uL (ref 0.7–4.0)
Lymphocytes Relative: 24 %
MCH: 34.2 pg — ABNORMAL HIGH (ref 26.0–34.0)
MCHC: 36.1 g/dL — ABNORMAL HIGH (ref 30.0–36.0)
MCV: 94.9 fL (ref 78.0–100.0)
Monocytes Absolute: 0.5 10*3/uL (ref 0.1–1.0)
Monocytes Relative: 10 %
NEUTROS ABS: 3.3 10*3/uL (ref 1.7–7.7)
NEUTROS PCT: 63 %
Platelets: 133 10*3/uL — ABNORMAL LOW (ref 150–400)
RBC: 4.47 MIL/uL (ref 4.22–5.81)
RDW: 13.2 % (ref 11.5–15.5)
WBC: 5.2 10*3/uL (ref 4.0–10.5)

## 2016-01-17 MED ORDER — PROCHLORPERAZINE MALEATE 10 MG PO TABS
ORAL_TABLET | ORAL | Status: AC
  Filled 2016-01-17: qty 1

## 2016-01-17 MED ORDER — BORTEZOMIB CHEMO SQ INJECTION 3.5 MG (2.5MG/ML)
1.3000 mg/m2 | Freq: Once | INTRAMUSCULAR | Status: AC
  Administered 2016-01-17: 2.75 mg via SUBCUTANEOUS
  Filled 2016-01-17: qty 2.75

## 2016-01-17 MED ORDER — PROCHLORPERAZINE MALEATE 10 MG PO TABS
10.0000 mg | ORAL_TABLET | Freq: Once | ORAL | Status: AC
  Administered 2016-01-17: 10 mg via ORAL

## 2016-01-17 NOTE — Patient Instructions (Signed)
[place] at [facility] Discharge Instructions  RECOMMENDATIONS MADE BY THE CONSULTANT AND ANY TEST RESULTS WILL BE SENT TO YOUR REFERRING PHYSICIAN.  You were seen today by Dr. Oliva Bustard. Return in 3 months for follow up and labs.   Thank you for choosing [place] at [facility] to provide your oncology and hematology care.  To afford each patient quality time with our provider, please arrive at least 15 minutes before your scheduled appointment time.   Beginning January 23rd 2017 lab work for the [facility] will be done in the  Main lab at [facility] on 1st floor. If you have a lab appointment with the [facility] please come in thru the  Main Entrance and check in at the main information desk  You need to re-schedule your appointment should you arrive 10 or more minutes late.  We strive to give you quality time with our providers, and arriving late affects you and other patients whose appointments are after yours.  Also, if you no show three or more times for appointments you may be dismissed from the clinic at the providers discretion.     Again, thank you for choosing [facility].  Our hope is that these requests will decrease the amount of time that you wait before being seen by our physicians.       _____________________________________________________________  Should you have questions after your visit to [facility], please contact our office at [phone] between the hours of 8:30 a.m. and 4:30 p.m.  Voicemails left after 4:30 p.m. will not be returned until the following business day.  For prescription refill requests, have your pharmacy contact our office.         Resources For Cancer Patients and their Caregivers ? American Cancer Society: Can assist with transportation, wigs, general needs, runs Look Good Feel Better.        ([phone] ? Cancer Care: Provides financial assistance,  online support groups, medication/co-pay assistance.  1-800-813-HOPE (256) 746-0014) ? [place] Assists [place] cancer patients and their families through emotional , educational and financial support.  (701)257-9546 ? [place] DSS Where to apply for food stamps, Medicaid and utility assistance. 615-298-3607 ? RCATS: Transportation to medical appointments. 312-531-8910 ? Social Security Administration: May apply for disability if have a Stage IV cancer. [phone] [phone])448-4843 ? [employer], Disability and Transit Services: Assists with nutrition, care and transit needs. [place] Support Programs: @10RELATIVEDAYS @ > Cancer Support Group  2nd Tuesday of the month 1pm-2pm, Journey Room  > Creative Journey  3rd Tuesday of the month 1130am-1pm, Journey Room  > Look Good Feel Better  1st Wednesday of the month 10am-12 noon, Journey Room (Call [place] to register [phone])

## 2016-01-17 NOTE — Progress Notes (Signed)
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  Day 0= 10/24/2013.      02/22/2014 - 03/26/2014 Chemotherapy    Lenalidomide 10 mg daily initiated for one month, and tolerated we'll increase to 15 mg daily.  Toxicity resulting in discontinuation.      03/26/2014 Adverse Reaction    Diffuse, worsening rash, aching and bones, joints, muscles, weakness, lenalidomide discontinued      05/06/2014 -  Chemotherapy    Velcade 1.3 mg/m every 14 days as maintenance therapy.       HISTORY OF PRESENTING ILLNESS:  Craig Rivera 60 y.o. male is here for a follow-up of multiple myeloma, IgG kappa. He is s/p autologous transplant Day 0 10/24/2013.   Patient is doing well. He only has difficulty sleeping when he puts on a new patch, but he changes patches every 48 hours. He notes that overall his sleep is much improved on fentanyl compared to oral narcotics.   Patient states his pain is managed. He sees neurologist tomorrow for his spine.   He has had flu vaccination and his vaccines are up to date. He saw Dr. Ok Edwards at [facility] on 10/19/2015. He will follow-up with him again in one year.  He does not need any refills today.   Patient is here for further follow-up and treatment consideration.  Has noticed some more numbness  in his feet but does not have any problem ambulating.  Occasional dizziness.  Decrease in appetite.  Today's the last Velcade therapy for the patient.  Patient is also getting follow-up at [facility] bone marrow transplant team  MEDICAL HISTORY:  Past Medical History:  Diagnosis Date  . Back pain   . HTN (hypertension)   . Hypercholesterolemia   . Multiple myeloma ([place])   . Sciatic nerve pain     SURGICAL HISTORY: Past Surgical History:  Procedure Laterality Date  . BONE MARROW TRANSPLANT    . CATARACT EXTRACTION W/PHACO Right 08/11/2014   Procedure: CATARACT EXTRACTION PHACO AND INTRAOCULAR LENS PLACEMENT (IOC);  Surgeon: Williams Che, MD;  Location: [facility] ORS;  Service: Ophthalmology;  Laterality: Right;  CDE 4.88  . CATARACT EXTRACTION W/PHACO Left 10/26/2014   Procedure: CATARACT EXTRACTION PHACO AND INTRAOCULAR LENS PLACEMENT LEFT EYE CDE=8.64;  Surgeon: Williams Che, MD;  Location: [facility] ORS;  Service: Ophthalmology;  Laterality: Left;  . COLONOSCOPY  09/12/2010   Procedure: COLONOSCOPY;  Surgeon: Daneil Dolin, MD;  Location: [facility] ENDO SUITE;  Service: Endoscopy;  Laterality: N/A;  . inguinal hernia repair bilateral     as child   . KNEE SURGERY Right    arthroscopy  . TONSILLECTOMY      SOCIAL HISTORY: Social History   Social History  . Marital status: Married    Spouse name: N/A  . Number of  children: N/A  . Years of education: N/A   Occupational History  . Not on file.   Social History Main Topics  . Smoking status: Current Every Day Smoker    Packs/day: 0.50    Years: 40.00    Types: Cigarettes  . Smokeless tobacco: Never Used  . Alcohol use No  . Drug use: No  . Sexual activity: Yes    Birth control/ protection: None   Other Topics Concern  . Not on file   Social History Narrative  . No narrative on file   Married for 25 years 4 children 9 grandchildren 1 pet, Boxer named Grace Smoker, 1 pack every 2 or 3 days. Started at 60 yo. States, he quit for a  while.  He used to play golf, however the myeloma fatigue has kept him from playing Worked uptown at [employer]: Family History  Problem Relation Age of Onset  . Colon polyps Father     passed away, [place]  . Colon polyps Sister   . Migraines Mother     deceased   Mother deceased at 57 or 79 yo Father deceased at 4 yo  1 brother died to kidney disease in the 85s 1 sister passed away from [place] and a male cancer.  Oldest sister, 78 yo, is still living and has MS. Mostly healthy Neither parent had MS that he knows of. No family history of myeloma  ALLERGIES:  is allergic to penicillins; ceclor [cefaclor]; and lenalidomide.  MEDICATIONS:  Current Outpatient Prescriptions  Medication Sig Dispense Refill  . acetaminophen (TYLENOL) 325 MG tablet Take 650 mg by mouth every 6 (six) hours as needed for mild pain.     Marland Kitchen acyclovir (ZOVIRAX) 200 MG capsule TAKE 2 CAPSULES (400 MG TOTAL) BY MOUTH 2 (TWO) TIMES DAILY. 120 capsule 1  . amLODipine (NORVASC) 10 MG tablet Take 10 mg by mouth daily.     . bortezomib IV (VELCADE) 3.5 MG injection Inject 1.3 mg/m2 into the vein every 14 (fourteen) days.    . calcium carbonate (OS-CAL) 600 MG TABS tablet Take 600 mg by mouth 2 (two) times daily with a meal.    . cephALEXin (KEFLEX) 500 MG capsule Take 1 capsule (500 mg total) by mouth 3 (three) times daily. 15 capsule 0  . fentaNYL (DURAGESIC - DOSED MCG/HR) 75 MCG/HR Place 1 patch (75 mcg total) onto the skin every other day. Last patch 07/27/14 15 patch 0  . FIBER PO Take by mouth.    . fluticasone (FLONASE) 50 MCG/ACT nasal spray Place 2 sprays into both nostrils daily as needed for allergies.     Marland Kitchen KLOR-CON M20 20 MEQ tablet Take 1 tablet by mouth 2 (two) times daily.  6  . naproxen sodium (ANAPROX) 220 MG tablet Take 220 mg by mouth 2 (two) times daily with a meal.    . oxyCODONE (OXY IR/ROXICODONE) 5 MG immediate release tablet Take 1 tablet (5 mg total) by mouth  every 4 (four) hours as needed. pain 30 tablet 0  . polyethylene glycol (MIRALAX / GLYCOLAX) packet Take 17 g by mouth daily as needed for mild constipation.    . potassium chloride (MICRO-K) 10 MEQ CR capsule Take by mouth.    . tamsulosin (FLOMAX) 0.4 MG CAPS capsule Take 1 capsule by mouth 2 (two) times daily.  5  . vitamin B-12 (CYANOCOBALAMIN) 1000 MCG tablet Take by mouth.     No current facility-administered medications for this visit.  Review of Systems  HENT: Negative.   Eyes: Negative.   Respiratory: Negative.  Negative for shortness of breath.   Cardiovascular: Negative.   Gastrointestinal: Negative.   Genitourinary: Negative.   Musculoskeletal: Positive for back pain.       Back pain well managed with oxycodone  Skin: Negative.   Neurological: Negative.   Endo/Heme/Allergies: Negative.   Psychiatric/Behavioral: Positive for depression. The patient has insomnia.        Poor sleep associated with back pain. Depression secondary to not being able to do things he previously could and insomnia due to opioid patch  All other systems reviewed and are negative.  14 point ROS was done and is otherwise as detailed above or in HPI   PHYSICAL EXAMINATION: ECOG PERFORMANCE STATUS: 1 - Symptomatic but completely ambulatory  There were no vitals taken for this visit.   Blood pressure is 1 40 x 81.  Pulse 59.  Respirations 16.  Temperature 90.8.  Oxygen saturation 97% Physical Exam  Constitutional: He is oriented to person, place, and time and well-developed, well-nourished, and in no distress.  HENT:  Head: Normocephalic and atraumatic.  Nose: Nose normal.  Mouth/Throat: Oropharynx is clear and moist. No oropharyngeal exudate.  Eyes: Conjunctivae and EOM are normal. Pupils are equal, round, and reactive to light. Right eye exhibits no discharge. Left eye exhibits no discharge. No scleral icterus.  Neck: Normal range of motion. Neck supple. No tracheal deviation present. No  thyromegaly present.  Cardiovascular: Normal rate, regular rhythm and normal heart sounds.  Exam reveals no gallop and no friction rub.   No murmur heard. Pulmonary/Chest: Effort normal and breath sounds normal. He has no wheezes. He has no rales.  Abdominal: Soft. Bowel sounds are normal. He exhibits no distension and no mass. There is no tenderness. There is no rebound and no guarding.  Musculoskeletal: Normal range of motion. He exhibits no edema.  Lymphadenopathy:    He has no cervical adenopathy.  Neurological: He is alert and oriented to person, place, and time. He has normal reflexes. No cranial nerve deficit. Gait normal. Coordination normal.  Skin: Skin is warm and dry. No rash noted.  LLE old injury from mowing incident.  Psychiatric: Mood, memory, affect and judgment normal.  Nursing note and vitals reviewed.    LABORATORY DATA:  I have reviewed the data as listed Lab Results  Component Value Date   WBC 5.2 01/17/2016   HGB 15.3 01/17/2016   HCT 42.4 01/17/2016   MCV 94.9 01/17/2016   PLT 133 (L) 01/17/2016   CMP     Component Value Date/Time   NA 136 01/03/2016 1300   K 4.0 01/03/2016 1300   CL 100 (L) 01/03/2016 1300   CO2 29 01/03/2016 1300   GLUCOSE 105 (H) 01/03/2016 1300   BUN 15 01/03/2016 1300   CREATININE 0.80 01/03/2016 1300   CALCIUM 9.7 01/03/2016 1300   PROT 7.3 01/03/2016 1300   ALBUMIN 4.3 01/03/2016 1300   AST 20 01/03/2016 1300   ALT 25 01/03/2016 1300   ALKPHOS 58 01/03/2016 1300   BILITOT 0.6 01/03/2016 1300   GFRNONAA >60 01/03/2016 1300   GFRAA >60 01/03/2016 1300     ASSESSMENT & PLAN:  Multiple myeloma, 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   Forest Gleason, MD  01/17/2016 1:32 PM

## 2016-01-18 LAB — KAPPA/LAMBDA LIGHT CHAINS
Kappa free light chain: 16.8 mg/L (ref 3.3–19.4)
Kappa, lambda light chain ratio: 1.45 (ref 0.26–1.65)
Lambda free light chains: 11.6 mg/L (ref 5.7–26.3)

## 2016-01-19 LAB — IMMUNOFIXATION ELECTROPHORESIS
IGM, SERUM: 16 mg/dL — AB (ref 20–172)
IgA: 99 mg/dL (ref 61–437)
IgG (Immunoglobin G), Serum: 1040 mg/dL (ref 700–1600)
Total Protein ELP: 6.4 g/dL (ref 6.0–8.5)

## 2016-01-20 LAB — PROTEIN ELECTROPHORESIS, SERUM
A/G Ratio: 1.2 (ref 0.7–1.7)
Albumin ELP: 3.5 g/dL (ref 2.9–4.4)
Alpha-1-Globulin: 0.3 g/dL (ref 0.0–0.4)
Alpha-2-Globulin: 0.7 g/dL (ref 0.4–1.0)
Beta Globulin: 0.9 g/dL (ref 0.7–1.3)
GAMMA GLOBULIN: 1.2 g/dL (ref 0.4–1.8)
Globulin, Total: 3 g/dL (ref 2.2–3.9)
TOTAL PROTEIN ELP: 6.5 g/dL (ref 6.0–8.5)

## 2016-01-25 ENCOUNTER — Other Ambulatory Visit (HOSPITAL_COMMUNITY): Payer: Self-pay | Admitting: Oncology

## 2016-02-01 ENCOUNTER — Other Ambulatory Visit (HOSPITAL_COMMUNITY): Payer: Self-pay | Admitting: Emergency Medicine

## 2016-02-01 DIAGNOSIS — C9000 Multiple myeloma not having achieved remission: Secondary | ICD-10-CM

## 2016-02-01 MED ORDER — FENTANYL 75 MCG/HR TD PT72: 75.0000 ug | MEDICATED_PATCH | TRANSDERMAL | 0 refills | Status: DC

## 2016-03-02 ENCOUNTER — Other Ambulatory Visit (HOSPITAL_COMMUNITY): Payer: Self-pay | Admitting: Emergency Medicine

## 2016-03-02 DIAGNOSIS — C9000 Multiple myeloma not having achieved remission: Secondary | ICD-10-CM

## 2016-03-02 MED ORDER — FENTANYL 75 MCG/HR TD PT72: 75.0000 ug | MEDICATED_PATCH | TRANSDERMAL | 0 refills | Status: DC

## 2016-03-02 MED ORDER — OXYCODONE HCL 5 MG PO TABS: 5.0000 mg | ORAL_TABLET | ORAL | 0 refills | Status: DC | PRN

## 2016-03-14 ENCOUNTER — Ambulatory Visit (HOSPITAL_COMMUNITY)
Admission: RE | Admit: 2016-03-14 | Discharge: 2016-03-14 | Disposition: A | Discharge status: Home / Self Care | Payer: 59 | Source: Ambulatory Visit | Attending: Family Medicine | Admitting: Family Medicine

## 2016-03-14 ENCOUNTER — Other Ambulatory Visit (HOSPITAL_COMMUNITY): Payer: Self-pay | Admitting: Family Medicine

## 2016-03-14 DIAGNOSIS — J209 Acute bronchitis, unspecified: Secondary | ICD-10-CM | POA: Diagnosis present

## 2016-03-14 DIAGNOSIS — R059 Cough, unspecified: Secondary | ICD-10-CM

## 2016-03-14 DIAGNOSIS — J069 Acute upper respiratory infection, unspecified: Secondary | ICD-10-CM | POA: Insufficient documentation

## 2016-03-14 DIAGNOSIS — R05 Cough: Secondary | ICD-10-CM

## 2016-03-27 ENCOUNTER — Other Ambulatory Visit (HOSPITAL_COMMUNITY): Payer: Self-pay | Admitting: Oncology

## 2016-03-28 ENCOUNTER — Encounter: Payer: Self-pay | Admitting: *Deleted

## 2016-03-28 NOTE — Progress Notes (Signed)
[facility] Clinical Social Work  Clinical Social Work was referred by Financial controller for assessment of psychosocial needs due to questions about support group options. Clinical Social Worker attempted to contact at home to offer support and assess for needs.  CSW reached pt's voicemail and left information about our general support group at [facility], but also info about MM group that takes place in [place]. CSW encouraged pt to return call as needed.     Clinical Social Work interventions: Resource education  Loren Racer, LCSW, OSW-C [place] Tuesdays   [phone]

## 2016-04-03 ENCOUNTER — Other Ambulatory Visit (HOSPITAL_COMMUNITY): Payer: Self-pay | Admitting: Emergency Medicine

## 2016-04-03 ENCOUNTER — Other Ambulatory Visit (HOSPITAL_COMMUNITY): Payer: Self-pay

## 2016-04-03 DIAGNOSIS — C9000 Multiple myeloma not having achieved remission: Secondary | ICD-10-CM

## 2016-04-03 MED ORDER — OXYCODONE HCL 5 MG PO TABS: 5.0000 mg | ORAL_TABLET | ORAL | 0 refills | Status: DC | PRN

## 2016-04-03 MED ORDER — FENTANYL 75 MCG/HR TD PT72: 75.0000 ug | MEDICATED_PATCH | TRANSDERMAL | 0 refills | Status: DC

## 2016-04-03 NOTE — Telephone Encounter (Signed)
Patient called for refill on Fentanyl patches and oxycodone. Reviewed with PA, chart checked and refilled. Patient notified he can pick up prescriptions this afternoon.

## 2016-04-03 NOTE — Telephone Encounter (Signed)
After further review, PA-C had already refilled prescriptions.

## 2016-04-04 ENCOUNTER — Other Ambulatory Visit (HOSPITAL_COMMUNITY): Payer: Self-pay

## 2016-04-04 DIAGNOSIS — E876 Hypokalemia: Secondary | ICD-10-CM

## 2016-04-05 ENCOUNTER — Encounter (HOSPITAL_COMMUNITY): Payer: 59 | Attending: Oncology

## 2016-04-05 DIAGNOSIS — E876 Hypokalemia: Secondary | ICD-10-CM | POA: Diagnosis present

## 2016-04-05 LAB — POTASSIUM: Potassium: 3.6 mmol/L (ref 3.5–5.1)

## 2016-04-17 ENCOUNTER — Encounter (HOSPITAL_COMMUNITY): Payer: 59 | Attending: Oncology | Admitting: Oncology

## 2016-04-17 ENCOUNTER — Encounter (HOSPITAL_COMMUNITY): Payer: Self-pay

## 2016-04-17 ENCOUNTER — Encounter (HOSPITAL_COMMUNITY): Payer: 59

## 2016-04-17 VITALS — BP 129/77 | HR 58 | Temp 98.2°F | Resp 18 | Wt 216.7 lb

## 2016-04-17 DIAGNOSIS — Z9889 Other specified postprocedural states: Secondary | ICD-10-CM | POA: Insufficient documentation

## 2016-04-17 DIAGNOSIS — Z9481 Bone marrow transplant status: Secondary | ICD-10-CM | POA: Insufficient documentation

## 2016-04-17 DIAGNOSIS — C9000 Multiple myeloma not having achieved remission: Secondary | ICD-10-CM | POA: Diagnosis not present

## 2016-04-17 DIAGNOSIS — I10 Essential (primary) hypertension: Secondary | ICD-10-CM | POA: Insufficient documentation

## 2016-04-17 DIAGNOSIS — F17210 Nicotine dependence, cigarettes, uncomplicated: Secondary | ICD-10-CM | POA: Diagnosis not present

## 2016-04-17 DIAGNOSIS — R5383 Other fatigue: Secondary | ICD-10-CM

## 2016-04-17 DIAGNOSIS — E7800 Pure hypercholesterolemia, unspecified: Secondary | ICD-10-CM | POA: Diagnosis not present

## 2016-04-17 DIAGNOSIS — Z9221 Personal history of antineoplastic chemotherapy: Secondary | ICD-10-CM | POA: Insufficient documentation

## 2016-04-17 DIAGNOSIS — Z880 Allergy status to penicillin: Secondary | ICD-10-CM | POA: Diagnosis not present

## 2016-04-17 DIAGNOSIS — Z8371 Family history of colonic polyps: Secondary | ICD-10-CM | POA: Insufficient documentation

## 2016-04-17 DIAGNOSIS — G4700 Insomnia, unspecified: Secondary | ICD-10-CM | POA: Insufficient documentation

## 2016-04-17 DIAGNOSIS — Z79899 Other long term (current) drug therapy: Secondary | ICD-10-CM | POA: Insufficient documentation

## 2016-04-17 DIAGNOSIS — Z888 Allergy status to other drugs, medicaments and biological substances status: Secondary | ICD-10-CM | POA: Insufficient documentation

## 2016-04-17 LAB — CBC WITH DIFFERENTIAL/PLATELET
BASOS PCT: 0 %
Basophils Absolute: 0 10*3/uL (ref 0.0–0.1)
Eosinophils Absolute: 0.2 10*3/uL (ref 0.0–0.7)
Eosinophils Relative: 3 %
HEMATOCRIT: 42.9 % (ref 39.0–52.0)
HEMOGLOBIN: 15.3 g/dL (ref 13.0–17.0)
LYMPHS ABS: 2 10*3/uL (ref 0.7–4.0)
LYMPHS PCT: 30 %
MCH: 33.9 pg (ref 26.0–34.0)
MCHC: 35.7 g/dL (ref 30.0–36.0)
MCV: 95.1 fL (ref 78.0–100.0)
MONOS PCT: 7 %
Monocytes Absolute: 0.5 10*3/uL (ref 0.1–1.0)
NEUTROS ABS: 3.9 10*3/uL (ref 1.7–7.7)
NEUTROS PCT: 60 %
Platelets: 152 10*3/uL (ref 150–400)
RBC: 4.51 MIL/uL (ref 4.22–5.81)
RDW: 13 % (ref 11.5–15.5)
WBC: 6.6 10*3/uL (ref 4.0–10.5)

## 2016-04-17 LAB — COMPREHENSIVE METABOLIC PANEL
ALK PHOS: 58 U/L (ref 38–126)
ALT: 23 U/L (ref 17–63)
ANION GAP: 6 (ref 5–15)
AST: 18 U/L (ref 15–41)
Albumin: 4.1 g/dL (ref 3.5–5.0)
BILIRUBIN TOTAL: 0.6 mg/dL (ref 0.3–1.2)
BUN: 13 mg/dL (ref 6–20)
CALCIUM: 9.4 mg/dL (ref 8.9–10.3)
CO2: 29 mmol/L (ref 22–32)
Chloride: 102 mmol/L (ref 101–111)
Creatinine, Ser: 0.87 mg/dL (ref 0.61–1.24)
GFR calc Af Amer: >60 mL/min (ref >60)
GLUCOSE: 131 mg/dL — AB (ref 65–99)
Potassium: 3.4 mmol/L — ABNORMAL LOW (ref 3.5–5.1)
Sodium: 137 mmol/L (ref 135–145)
TOTAL PROTEIN: 7 g/dL (ref 6.5–8.1)

## 2016-04-17 NOTE — Patient Instructions (Signed)
[place] at [place] Discharge Instructions  RECOMMENDATIONS MADE BY THE CONSULTANT AND ANY TEST RESULTS WILL BE SENT TO YOUR REFERRING PHYSICIAN.  You were seen today by Dr. Twana First Follow up in 3 months with labs the week prior See Amy up front for appointments   Thank you for choosing [place] at [facility] to provide your oncology and hematology care.  To afford each patient quality time with our provider, please arrive at least 15 minutes before your scheduled appointment time.    If you have a lab appointment with the [facility] please come in thru the  Main Entrance and check in at the main information desk  You need to re-schedule your appointment should you arrive 10 or more minutes late.  We strive to give you quality time with our providers, and arriving late affects you and other patients whose appointments are after yours.  Also, if you no show three or more times for appointments you may be dismissed from the clinic at the providers discretion.     Again, thank you for choosing [facility].  Our hope is that these requests will decrease the amount of time that you wait before being seen by our physicians.       _____________________________________________________________  Should you have questions after your visit to [facility], please contact our office at [phone] between the hours of 8:30 a.m. and 4:30 p.m.  Voicemails left after 4:30 p.m. will not be returned until the following business day.  For prescription refill requests, have your pharmacy contact our office.       Resources For Cancer Patients and their Caregivers ? American Cancer Society: Can assist with transportation, wigs, general needs, runs Look Good Feel Better.        6024036384 ? Cancer Care: Provides financial assistance, online support groups, medication/co-pay assistance.  1-800-813-HOPE  9146438344) ? [place] Assists [place] cancer patients and their families through emotional , educational and financial support.  703-694-5527 ? [place] DSS Where to apply for food stamps, Medicaid and utility assistance. 4183091824 ? RCATS: Transportation to medical appointments. 862-538-0732 ? Social Security Administration: May apply for disability if have a Stage IV cancer. (903)019-1808 (313)301-0598 ? [employer], Disability and Transit Services: Assists with nutrition, care and transit needs. [place] Support Programs: @10RELATIVEDAYS @ > Cancer Support Group  2nd Tuesday of the month 1pm-2pm, Journey Room  > Creative Journey  3rd Tuesday of the month 1130am-1pm, Journey Room  > Look Good Feel Better  1st Wednesday of the month 10am-12 noon, Journey Room (Call [place] to register [phone])

## 2016-04-17 NOTE — Progress Notes (Signed)
[place]  PROGRESS NOTE  Patient Care Team: Redmond School, MD as PCP - General (Internal Medicine)  CHIEF COMPLAINTS/PURPOSE OF CONSULTATION:  Multiple myeloma Bone marrow transplant at 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  Day 0= 10/24/2013.      02/22/2014 - 03/26/2014 Chemotherapy    Lenalidomide 10 mg daily initiated for one month, and tolerated we'll increase to 15 mg daily.  Toxicity resulting in discontinuation.      03/26/2014 Adverse Reaction    Diffuse, worsening rash, aching and bones, joints, muscles, weakness, lenalidomide discontinued      05/06/2014 - 01/17/2016 Chemotherapy    Velcade 1.3 mg/m every 14 days as maintenance therapy.       HISTORY OF PRESENTING ILLNESS:  Craig Rivera 61 y.o. male is here for a follow-up of multiple myeloma, IgG kappa. He is s/p autologous transplant Day 0 10/24/2013.    INTERVAL HISTORY: The patient returns today for follow up. He is accompanied by his wife. The patient reports fatigue since finishing Velcade in December 2017. He has had to stop working because of this fatigue. 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 He reports a recent chest cold approximately 1 month ago. He denies any fever, chills. The patient's wife requests info about multiple myeloma support groups she could attend to help her husband through his recovery.   MEDICAL HISTORY:  Past  Medical History:  Diagnosis Date  . Back pain   . HTN (hypertension)   . Hypercholesterolemia   . Multiple myeloma ([place])   . Sciatic nerve pain     SURGICAL HISTORY: Past Surgical History:  Procedure Laterality Date  . BONE MARROW TRANSPLANT    . CATARACT EXTRACTION W/PHACO Right 08/11/2014   Procedure: CATARACT EXTRACTION PHACO AND INTRAOCULAR LENS PLACEMENT (IOC);  Surgeon: Williams Che, MD;  Location: [facility] ORS;  Service: Ophthalmology;  Laterality: Right;  CDE 4.88  . CATARACT EXTRACTION W/PHACO Left 10/26/2014   Procedure: CATARACT EXTRACTION PHACO AND INTRAOCULAR LENS PLACEMENT LEFT EYE CDE=8.64;  Surgeon: Williams Che, MD;  Location: [facility] ORS;  Service: Ophthalmology;  Laterality: Left;  . COLONOSCOPY  09/12/2010   Procedure: COLONOSCOPY;  Surgeon: Daneil Dolin, MD;  Location: [facility] ENDO SUITE;  Service: Endoscopy;  Laterality: N/A;  . inguinal hernia repair bilateral     as child   . KNEE SURGERY Right    arthroscopy  . TONSILLECTOMY      SOCIAL HISTORY: Social History   Social History  . Marital status: Married    Spouse name: N/A  . Number of children: N/A  . Years of education: N/A   Occupational History  . Not on file.   Social History Main Topics  . Smoking status: Current Every Day Smoker    Packs/day: 0.50    Years: 40.00    Types: Cigarettes  . Smokeless tobacco: Never Used  . Alcohol use No  .  Drug use: No  . Sexual activity: Yes    Birth control/ protection: None   Other Topics Concern  . Not on file   Social History Narrative  . No narrative on file   Married for 25 years 4 children 9 grandchildren 1 pet, Boxer named Grace Smoker, 1 pack every 2 or 3 days. Started at 61 yo. States, he quit for a while.  He used to play golf, however the myeloma fatigue has kept him from playing Worked [place] at [employer]: Family History  Problem Relation Age of Onset  . Colon polyps Father     passed away, [place]  .  Colon polyps Sister   . Migraines Mother     deceased   Mother deceased at 64 or 84 yo Father deceased at 93 yo  1 brother died to kidney disease in the 67s 1 sister passed away from [place] and a male cancer.  Oldest sister, 73 yo, is still living and has MS. Mostly healthy Neither parent had MS that he knows of. No family history of myeloma  ALLERGIES:  is allergic to penicillins; ceclor [cefaclor]; and lenalidomide.  MEDICATIONS:  Current Outpatient Prescriptions  Medication Sig Dispense Refill  . acetaminophen (TYLENOL) 325 MG tablet Take 650 mg by mouth every 6 (six) hours as needed for mild pain.     Marland Kitchen acyclovir (ZOVIRAX) 200 MG capsule TAKE 2 CAPSULES (400 MG TOTAL) BY MOUTH 2 (TWO) TIMES DAILY. 120 capsule 1  . amLODipine (NORVASC) 10 MG tablet Take 10 mg by mouth daily.     . fentaNYL (DURAGESIC - DOSED MCG/HR) 75 MCG/HR Place 1 patch (75 mcg total) onto the skin every other day. Last patch 07/27/14 15 patch 0  . FIBER PO Take by mouth.    . Magnesium Oxide 250 MG TABS Take 1 tablet by mouth 3 (three) times daily.    . meclizine (ANTIVERT) 25 MG tablet Take 25 mg by mouth every 6 (six) hours as needed for dizziness.    . naproxen sodium (ANAPROX) 220 MG tablet Take 220 mg by mouth 2 (two) times daily with a meal.    . oxyCODONE (OXY IR/ROXICODONE) 5 MG immediate release tablet Take 1 tablet (5 mg total) by mouth every 4 (four) hours as needed. pain 30 tablet 0  . tamsulosin (FLOMAX) 0.4 MG CAPS capsule Take 1 capsule by mouth 2 (two) times daily.  5   No current facility-administered medications for this visit.     Review of Systems  Constitutional: Positive for malaise/fatigue. Negative for chills and fever.  HENT: Negative.   Eyes: Negative.   Respiratory: Negative.  Negative for shortness of breath.   Cardiovascular: Negative.   Gastrointestinal: Negative.   Genitourinary: Negative.   Skin: Negative.   Neurological: Positive for tingling (Neuropathy on sole of left  foot) and weakness.  Endo/Heme/Allergies: Negative.   All other systems reviewed and are negative.  14 point ROS was done and is otherwise as detailed above or in HPI   PHYSICAL EXAMINATION: ECOG PERFORMANCE STATUS: 1 - Symptomatic but completely ambulatory    Vitals:   04/17/16 1119  BP: 129/77  Pulse: (!) 58  Resp: 18  Temp: 98.2 F (36.8 C)   Filed Weights   04/17/16 1119  Weight: 216 lb 11.2 oz (98.3 kg)    Physical Exam  Constitutional: He is oriented to person, place, and time and well-developed, well-nourished, and in no distress.  HENT:  Head: Normocephalic and atraumatic.  Nose: Nose normal.  Mouth/Throat: Oropharynx is clear and moist.  Eyes: Conjunctivae and EOM are normal. Pupils are equal, round, and reactive to light.  Neck: Normal range of motion. Neck supple.  Cardiovascular: Normal rate, regular rhythm and normal heart sounds.   Pulmonary/Chest: Effort normal and breath sounds normal.  Abdominal: Soft. Bowel sounds are normal.  Musculoskeletal: Normal range of motion.  Neurological: He is alert and oriented to person, place, and time. He has normal reflexes. Gait normal.  Skin: Skin is warm and dry.  LLE old injury from mowing incident.  Psychiatric: Mood, memory, affect and judgment normal.  Nursing note and vitals reviewed.    LABORATORY DATA:  I have reviewed the data as listed Lab Results  Component Value Date   WBC 6.6 04/17/2016   HGB 15.3 04/17/2016   HCT 42.9 04/17/2016   MCV 95.1 04/17/2016   PLT 152 04/17/2016   CMP     Component Value Date/Time   NA 137 04/17/2016 1040   K 3.4 (L) 04/17/2016 1040   CL 102 04/17/2016 1040   CO2 29 04/17/2016 1040   GLUCOSE 131 (H) 04/17/2016 1040   BUN 13 04/17/2016 1040   CREATININE 0.87 04/17/2016 1040   CALCIUM 9.4 04/17/2016 1040   PROT 7.0 04/17/2016 1040   ALBUMIN 4.1 04/17/2016 1040   AST 18 04/17/2016 1040   ALT 23 04/17/2016 1040   ALKPHOS 58 04/17/2016 1040   BILITOT 0.6  04/17/2016 1040   GFRNONAA >60 04/17/2016 1040   GFRAA >60 04/17/2016 1040   RADIOLOGY: I have reviewed the images below and agree with the reported results.  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 M-spike nondetectable on last SPEP in December 2017. No evidence of cytopenias. Labs from today pending, will follow up.  I advised the patient to slowly increase his activity level to increase his stamina and reduce his fatigue. The patient is in agreement with this.  Continue follow up with Dr. Ok Edwards, his transplant physician, in September 2018 as scheduled. He is uptodate on all vaccinations post transplant.   RTC in 3 months with CBC, CMP, kappa/lambda light chains, immunoglobulins, SPEP, and Beta-2 microglobulin.     All questions were answered. The patient knows to call the clinic with any problems, questions or concerns.  This document serves as a record of services personally performed by Twana First, MD. It was created on her behalf by Maryla Morrow, a trained medical scribe. The creation of this record is based on the scribe's personal observations and the provider's statements to them. This document has been checked and approved by the attending provider.  I have reviewed the above documentation for accuracy and completeness, and I agree with the above.  This note was electronically signed.    Twana First, MD 04/17/2016 11:31 AM

## 2016-04-18 LAB — KAPPA/LAMBDA LIGHT CHAINS
KAPPA, LAMDA LIGHT CHAIN RATIO: 1.53 (ref 0.26–1.65)
Kappa free light chain: 17.4 mg/L (ref 3.3–19.4)
Lambda free light chains: 11.4 mg/L (ref 5.7–26.3)

## 2016-04-18 LAB — PROTEIN ELECTROPHORESIS, SERUM
A/G RATIO SPE: 1.3 (ref 0.7–1.7)
ALBUMIN ELP: 3.8 g/dL (ref 2.9–4.4)
Alpha-1-Globulin: 0.2 g/dL (ref 0.0–0.4)
Alpha-2-Globulin: 0.7 g/dL (ref 0.4–1.0)
BETA GLOBULIN: 0.9 g/dL (ref 0.7–1.3)
GLOBULIN, TOTAL: 3 g/dL (ref 2.2–3.9)
Gamma Globulin: 1.2 g/dL (ref 0.4–1.8)
TOTAL PROTEIN ELP: 6.8 g/dL (ref 6.0–8.5)

## 2016-04-19 LAB — IMMUNOFIXATION ELECTROPHORESIS
IGA: 93 mg/dL (ref 61–437)
IGM, SERUM: 19 mg/dL — AB (ref 20–172)
IgG (Immunoglobin G), Serum: 1115 mg/dL (ref 700–1600)
Total Protein ELP: 7.4 g/dL (ref 6.0–8.5)

## 2016-04-24 ENCOUNTER — Other Ambulatory Visit (HOSPITAL_COMMUNITY): Payer: Self-pay

## 2016-04-24 DIAGNOSIS — C9000 Multiple myeloma not having achieved remission: Secondary | ICD-10-CM

## 2016-04-24 MED ORDER — ACYCLOVIR 200 MG PO CAPS: 400.0000 mg | ORAL_CAPSULE | Freq: Two times a day (BID) | ORAL | 1 refills | Status: DC

## 2016-04-24 NOTE — Telephone Encounter (Signed)
Received refill request from patients pharmacy for 90 day supply for Acyclovir. Reviewed with PA-C, chart checked and refilled.

## 2016-05-04 ENCOUNTER — Other Ambulatory Visit (HOSPITAL_COMMUNITY): Payer: Self-pay | Admitting: Oncology

## 2016-05-04 ENCOUNTER — Telehealth (HOSPITAL_COMMUNITY): Payer: Self-pay | Admitting: *Deleted

## 2016-05-04 DIAGNOSIS — C9000 Multiple myeloma not having achieved remission: Secondary | ICD-10-CM

## 2016-05-04 MED ORDER — FENTANYL 75 MCG/HR TD PT72: 75.0000 ug | MEDICATED_PATCH | TRANSDERMAL | 0 refills | Status: DC

## 2016-05-04 MED ORDER — OXYCODONE HCL 5 MG PO TABS: 5.0000 mg | ORAL_TABLET | ORAL | 0 refills | Status: DC | PRN

## 2016-05-09 ENCOUNTER — Telehealth (HOSPITAL_COMMUNITY): Payer: Self-pay

## 2016-05-09 NOTE — Telephone Encounter (Signed)
Patient came by the cancer center because insurance will not approve his pain patches at the quantity requested. He has to change them every 2 days instead of every 3 days. He states he would like to stop using the patches and just take pain pills. Reviewed with PA-C. The PA doesn't want to change patients pain medications unless insurance absolutely will not pain for the pain patches. Financial advocate, Angie, made aware so she can try to obtain prior auth for the Fentanyl patches. Notified patient's wife that the PA did not want to change medications and Angie is working on getting the patches approved. She verbalized understanding.

## 2016-05-15 ENCOUNTER — Telehealth: Payer: Self-pay | Admitting: *Deleted

## 2016-05-15 NOTE — Telephone Encounter (Signed)
Returned call to patient's wife who left message requesting information.  "Multiple Myeloma support group information.  I heard you all have a group for patients, caregivers and I've got to do something."  Provided [facility] for Support information.  This group meets the second Tuesday of the month from 4:00 pm - 5:30 pm in room G-81.  For more information call [phone].  No further questions.    

## 2016-05-16 NOTE — Telephone Encounter (Signed)
Didn't know if you needed this or not. Thanks

## 2016-05-22 ENCOUNTER — Encounter: Payer: Self-pay | Admitting: General Practice

## 2016-05-22 NOTE — Progress Notes (Signed)
[place] Note  Craig Rivera and wife Craig Rivera attended MM Support Group in [place] on 05/16/16. Provided packet of [facility] support resources, as well as emotional support and encouragement. Followed up today with handwritten note card.   [place], [place], [facility] Pager [phone] Voicemail [phone]

## 2016-06-05 ENCOUNTER — Other Ambulatory Visit (HOSPITAL_COMMUNITY): Payer: Self-pay | Admitting: Emergency Medicine

## 2016-06-05 ENCOUNTER — Telehealth (HOSPITAL_COMMUNITY): Payer: Self-pay | Admitting: *Deleted

## 2016-06-05 DIAGNOSIS — C9000 Multiple myeloma not having achieved remission: Secondary | ICD-10-CM

## 2016-06-05 MED ORDER — FENTANYL 75 MCG/HR TD PT72: 75.0000 ug | MEDICATED_PATCH | TRANSDERMAL | 0 refills | Status: DC

## 2016-06-05 NOTE — Progress Notes (Signed)
Fentanyl refilled.  

## 2016-06-20 ENCOUNTER — Other Ambulatory Visit (HOSPITAL_COMMUNITY): Payer: Self-pay | Admitting: Emergency Medicine

## 2016-06-20 DIAGNOSIS — C9000 Multiple myeloma not having achieved remission: Secondary | ICD-10-CM

## 2016-06-20 MED ORDER — OXYCODONE HCL 5 MG PO TABS: 5.0000 mg | ORAL_TABLET | ORAL | 0 refills | Status: DC | PRN

## 2016-06-20 NOTE — Progress Notes (Signed)
Refill on oxycodone

## 2016-07-06 ENCOUNTER — Other Ambulatory Visit (HOSPITAL_COMMUNITY): Payer: Self-pay

## 2016-07-06 DIAGNOSIS — C9000 Multiple myeloma not having achieved remission: Secondary | ICD-10-CM

## 2016-07-06 MED ORDER — FENTANYL 75 MCG/HR TD PT72: 75.0000 ug | MEDICATED_PATCH | TRANSDERMAL | 0 refills | Status: DC

## 2016-07-06 NOTE — Telephone Encounter (Signed)
Patient called and said he needed refill on fentanyl patches. Reviewed with provider, chart checked and refilled.

## 2016-07-11 ENCOUNTER — Encounter (HOSPITAL_COMMUNITY): Payer: 59 | Attending: Oncology

## 2016-07-11 ENCOUNTER — Other Ambulatory Visit (HOSPITAL_COMMUNITY): Payer: Self-pay | Admitting: Oncology

## 2016-07-11 ENCOUNTER — Telehealth (HOSPITAL_COMMUNITY): Payer: Self-pay | Admitting: *Deleted

## 2016-07-11 DIAGNOSIS — C9000 Multiple myeloma not having achieved remission: Secondary | ICD-10-CM | POA: Insufficient documentation

## 2016-07-11 LAB — CBC WITH DIFFERENTIAL/PLATELET
Basophils Absolute: 0 10*3/uL (ref 0.0–0.1)
Basophils Relative: 0 %
EOS PCT: 1 %
Eosinophils Absolute: 0.1 10*3/uL (ref 0.0–0.7)
HCT: 41.2 % (ref 39.0–52.0)
Hemoglobin: 14.7 g/dL (ref 13.0–17.0)
LYMPHS ABS: 1.8 10*3/uL (ref 0.7–4.0)
LYMPHS PCT: 22 %
MCH: 33.9 pg (ref 26.0–34.0)
MCHC: 35.7 g/dL (ref 30.0–36.0)
MCV: 95.2 fL (ref 78.0–100.0)
MONO ABS: 0.6 10*3/uL (ref 0.1–1.0)
MONOS PCT: 7 %
Neutro Abs: 5.7 10*3/uL (ref 1.7–7.7)
Neutrophils Relative %: 70 %
PLATELETS: 152 10*3/uL (ref 150–400)
RBC: 4.33 MIL/uL (ref 4.22–5.81)
RDW: 12.8 % (ref 11.5–15.5)
WBC: 8.3 10*3/uL (ref 4.0–10.5)

## 2016-07-11 LAB — COMPREHENSIVE METABOLIC PANEL
ALT: 63 U/L (ref 17–63)
AST: 27 U/L (ref 15–41)
Albumin: 4.1 g/dL (ref 3.5–5.0)
Alkaline Phosphatase: 72 U/L (ref 38–126)
Anion gap: 7 (ref 5–15)
BILIRUBIN TOTAL: 0.8 mg/dL (ref 0.3–1.2)
BUN: 13 mg/dL (ref 6–20)
CHLORIDE: 105 mmol/L (ref 101–111)
CO2: 24 mmol/L (ref 22–32)
Calcium: 9.4 mg/dL (ref 8.9–10.3)
Creatinine, Ser: 0.99 mg/dL (ref 0.61–1.24)
Glucose, Bld: 143 mg/dL — ABNORMAL HIGH (ref 65–99)
POTASSIUM: 3.4 mmol/L — AB (ref 3.5–5.1)
Sodium: 136 mmol/L (ref 135–145)
TOTAL PROTEIN: 7.2 g/dL (ref 6.5–8.1)

## 2016-07-11 MED ORDER — OXYCODONE HCL 5 MG PO TABS: 5.0000 mg | ORAL_TABLET | ORAL | 0 refills | Status: DC | PRN

## 2016-07-12 LAB — PROTEIN ELECTROPHORESIS, SERUM
A/G RATIO SPE: 1.2 (ref 0.7–1.7)
ALBUMIN ELP: 3.5 g/dL (ref 2.9–4.4)
ALPHA-1-GLOBULIN: 0.3 g/dL (ref 0.0–0.4)
ALPHA-2-GLOBULIN: 0.7 g/dL (ref 0.4–1.0)
BETA GLOBULIN: 0.9 g/dL (ref 0.7–1.3)
GLOBULIN, TOTAL: 3 g/dL (ref 2.2–3.9)
Gamma Globulin: 1.1 g/dL (ref 0.4–1.8)
Total Protein ELP: 6.5 g/dL (ref 6.0–8.5)

## 2016-07-12 LAB — IGG, IGA, IGM
IGA: 100 mg/dL (ref 61–437)
IGG (IMMUNOGLOBIN G), SERUM: 1022 mg/dL (ref 700–1600)
IgM, Serum: 33 mg/dL (ref 20–172)

## 2016-07-12 LAB — KAPPA/LAMBDA LIGHT CHAINS
KAPPA FREE LGHT CHN: 17.6 mg/L (ref 3.3–19.4)
Kappa, lambda light chain ratio: 1.17 (ref 0.26–1.65)
LAMDA FREE LIGHT CHAINS: 15 mg/L (ref 5.7–26.3)

## 2016-07-12 LAB — BETA 2 MICROGLOBULIN, SERUM: BETA 2 MICROGLOBULIN: 2.4 mg/L (ref 0.6–2.4)

## 2016-07-18 ENCOUNTER — Encounter (HOSPITAL_BASED_OUTPATIENT_CLINIC_OR_DEPARTMENT_OTHER): Payer: 59 | Admitting: Oncology

## 2016-07-18 ENCOUNTER — Other Ambulatory Visit (HOSPITAL_COMMUNITY): Payer: Self-pay | Admitting: Oncology

## 2016-07-18 ENCOUNTER — Encounter (HOSPITAL_COMMUNITY): Payer: Self-pay | Admitting: Oncology

## 2016-07-18 VITALS — BP 143/84 | HR 65 | Resp 16 | Ht 71.0 in | Wt 211.0 lb

## 2016-07-18 DIAGNOSIS — G893 Neoplasm related pain (acute) (chronic): Secondary | ICD-10-CM

## 2016-07-18 DIAGNOSIS — G4700 Insomnia, unspecified: Secondary | ICD-10-CM

## 2016-07-18 DIAGNOSIS — G8929 Other chronic pain: Secondary | ICD-10-CM

## 2016-07-18 DIAGNOSIS — C9000 Multiple myeloma not having achieved remission: Secondary | ICD-10-CM | POA: Diagnosis not present

## 2016-07-18 DIAGNOSIS — F3341 Major depressive disorder, recurrent, in partial remission: Secondary | ICD-10-CM

## 2016-07-18 DIAGNOSIS — Z9481 Bone marrow transplant status: Secondary | ICD-10-CM

## 2016-07-18 DIAGNOSIS — Z720 Tobacco use: Secondary | ICD-10-CM

## 2016-07-18 MED ORDER — SERTRALINE HCL 50 MG PO TABS: 50.0000 mg | ORAL_TABLET | Freq: Every day | ORAL | 0 refills | Status: DC

## 2016-07-18 NOTE — Patient Instructions (Addendum)
[facility] at [facility] Discharge Instructions  RECOMMENDATIONS MADE BY THE CONSULTANT AND ANY TEST RESULTS WILL BE SENT TO YOUR REFERRING PHYSICIAN.  You were seen today by Dr. Choksi. Return in 3 months for labs and follow up.   Thank you for choosing [facility] at [facility] to provide your oncology and hematology care.  To afford each patient quality time with our provider, please arrive at least 15 minutes before your scheduled appointment time.    If you have a lab appointment with the Cancer Center please come in thru the  Main Entrance and check in at the main information desk  You need to re-schedule your appointment should you arrive 10 or more minutes late.  We strive to give you quality time with our providers, and arriving late affects you and other patients whose appointments are after yours.  Also, if you no show three or more times for appointments you may be dismissed from the clinic at the providers discretion.     Again, thank you for choosing [facility].  Our hope is that these requests will decrease the amount of time that you wait before being seen by our physicians.       _____________________________________________________________  Should you have questions after your visit to  Cancer Center, please contact our office at [phone] between the hours of 8:30 a.m. and 4:30 p.m.  Voicemails left after 4:30 p.m. will not be returned until the following business day.  For prescription refill requests, have your pharmacy contact our office.       Resources For Cancer Patients and their Caregivers ? American Cancer Society: Can assist with transportation, wigs, general needs, runs Look Good Feel Better.        1-888-227-6333 ? Cancer Care: Provides financial assistance, online support groups, medication/co-pay assistance.  1-800-813-HOPE (4673) ? [facility] Assists  [place] cancer patients and their families through emotional , educational and financial support.  336-427-4357 ? [place] DSS Where to apply for food stamps, Medicaid and utility assistance. 336-342-1394 ? RCATS: Transportation to medical appointments. 336-347-2287 ? Social Security Administration: May apply for disability if have a Stage IV cancer. 336-342-7796 1-800-772-1213 ? [place] Aging, Disability and Transit Services: Assists with nutrition, care and transit needs. 336-349-2343  Cancer Center Support Programs: @10RELATIVEDAYS@ > Cancer Support Group  2nd Tuesday of the month 1pm-2pm, Journey Room  > Creative Journey  3rd Tuesday of the month 1130am-1pm, Journey Room  > Look Good Feel Better  1st Wednesday of the month 10am-12 noon, Journey Room (Call American Cancer [employer] to register 1-800-395-5775)    

## 2016-07-18 NOTE — Progress Notes (Signed)
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  Day 0= 10/24/2013.      02/22/2014 - 03/26/2014 Chemotherapy    Lenalidomide 10 mg daily initiated for one month, and tolerated we'll increase to 15 mg daily.  Toxicity resulting in discontinuation.      03/26/2014 Adverse Reaction    Diffuse, worsening rash, aching and bones, joints, muscles, weakness, lenalidomide discontinued      05/06/2014 - 01/17/2016 Chemotherapy    Velcade 1.3 mg/m every 14 days as maintenance therapy.       HISTORY OF PRESENTING ILLNESS:  Craig Rivera 61 y.o. male is here for a follow-up of multiple myeloma, IgG kappa. He is s/p autologous transplant Day 0 10/24/2013.    INTERVAL HISTORY: The patient returns today for follow up. He is accompanied by his wife. The patient reports fatigue since finishing Velcade in December 2017. He has had to stop working because of this fatigue. 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 He reports a recent chest cold approximately 1 month ago. He denies any fever, chills. The patient's wife requests info about multiple myeloma support groups she could attend to help her husband through his recovery.   MEDICAL HISTORY:  Past  Medical History:  Diagnosis Date  . Back pain   . HTN (hypertension)   . Hypercholesterolemia   . Multiple myeloma ([place])   . Sciatic nerve pain     SURGICAL HISTORY: Past Surgical History:  Procedure Laterality Date  . BONE MARROW TRANSPLANT    . CATARACT EXTRACTION W/PHACO Right 08/11/2014   Procedure: CATARACT EXTRACTION PHACO AND INTRAOCULAR LENS PLACEMENT (IOC);  Surgeon: Williams Che, MD;  Location: [facility] ORS;  Service: Ophthalmology;  Laterality: Right;  CDE 4.88  . CATARACT EXTRACTION W/PHACO Left 10/26/2014   Procedure: CATARACT EXTRACTION PHACO AND INTRAOCULAR LENS PLACEMENT LEFT EYE CDE=8.64;  Surgeon: Williams Che, MD;  Location: [facility] ORS;  Service: Ophthalmology;  Laterality: Left;  . COLONOSCOPY  09/12/2010   Procedure: COLONOSCOPY;  Surgeon: Daneil Dolin, MD;  Location: [facility] ENDO SUITE;  Service: Endoscopy;  Laterality: N/A;  . inguinal hernia repair bilateral     as child   . KNEE SURGERY Right    arthroscopy  . TONSILLECTOMY      SOCIAL HISTORY: Social History   Social History  . Marital status: Married    Spouse name: N/A  . Number of children: N/A  . Years of education: N/A   Occupational History  . Not on file.   Social History Main Topics  . Smoking status: Current Every Day Smoker    Packs/day: 0.50    Years: 40.00    Types: Cigarettes  . Smokeless tobacco: Never Used  . Alcohol use No  .  Drug use: No  . Sexual activity: Yes    Birth control/ protection: None   Other Topics Concern  . Not on file   Social History Narrative  . No narrative on file   Married for 25 years 4 children 9 grandchildren 1 pet, Boxer named Grace Smoker, 1 pack every 2 or 3 days. Started at 61 yo. States, he quit for a while.  He used to play golf, however the myeloma fatigue has kept him from playing Worked uptown at [employer]: Family History  Problem Relation Age of Onset  . Colon polyps Father        passed away, [place]  .  Colon polyps Sister   . Migraines Mother        deceased   Mother deceased at 35 or 65 yo Father deceased at 54 yo  1 brother died to kidney disease in the 58s 1 sister passed away from [place] and a male cancer.  Oldest sister, 51 yo, is still living and has MS. Mostly healthy Neither parent had MS that he knows of. No family history of myeloma  ALLERGIES:  is allergic to penicillins; ceclor [cefaclor]; and lenalidomide.  MEDICATIONS:  Current Outpatient Prescriptions  Medication Sig Dispense Refill  . acetaminophen (TYLENOL) 325 MG tablet Take 650 mg by mouth every 6 (six) hours as needed for mild pain.     Marland Kitchen acyclovir (ZOVIRAX) 200 MG capsule Take 2 capsules (400 mg total) by mouth 2 (two) times daily. 360 capsule 1  . amLODipine (NORVASC) 10 MG tablet Take 10 mg by mouth daily.     . fentaNYL (DURAGESIC - DOSED MCG/HR) 75 MCG/HR Place 1 patch (75 mcg total) onto the skin every other day. 15 patch 0  . FIBER PO Take by mouth.    . Magnesium Oxide 250 MG TABS Take 1 tablet by mouth 3 (three) times daily.    Marland Kitchen oxyCODONE (OXY IR/ROXICODONE) 5 MG immediate release tablet Take 1 tablet (5 mg total) by mouth every 4 (four) hours as needed. pain 30 tablet 0  . tamsulosin (FLOMAX) 0.4 MG CAPS capsule Take 1 capsule by mouth 2 (two) times daily.  5   No current facility-administered medications for this visit.     Review of Systems  Constitutional: Positive for malaise/fatigue. Negative for chills and fever.  HENT: Negative.   Eyes: Negative.   Respiratory: Negative.  Negative for shortness of breath.   Cardiovascular: Negative.   Gastrointestinal: Negative.   Genitourinary: Negative.   Skin: Negative.   Neurological: Positive for tingling (Neuropathy on sole of left foot) and weakness.  Endo/Heme/Allergies: Negative.   All other systems reviewed and are negative.  14 point ROS was done and is otherwise as detailed above or in HPI   PHYSICAL EXAMINATION: ECOG PERFORMANCE STATUS:  1 - Symptomatic but completely ambulatory    Vitals:   07/18/16 1428  BP: (!) 143/84  Pulse: 65  Resp: 16   Filed Weights   07/18/16 1428  Weight: 211 lb (95.7 kg)    Physical Exam  Constitutional: He is oriented to person, place, and time and well-developed, well-nourished, and in no distress.  HENT:  Head: Normocephalic and atraumatic.  Nose: Nose normal.  Mouth/Throat: Oropharynx is clear and moist.  Eyes: Conjunctivae and EOM are normal. Pupils are equal, round, and reactive to light.  Neck: Normal range of motion. Neck supple.  Cardiovascular: Normal rate, regular rhythm and normal heart sounds.   Pulmonary/Chest: Effort normal  and breath sounds normal.  Abdominal: Soft. Bowel sounds are normal.  Musculoskeletal: Normal range of motion.  Neurological: He is alert and oriented to person, place, and time. He has normal reflexes. Gait normal.  Skin: Skin is warm and dry.  LLE old injury from mowing incident.  Psychiatric: Mood, memory, affect and judgment normal.  Nursing note and vitals reviewed.    LABORATORY DATA:  I have reviewed the data as listed Lab Results  Component Value Date   WBC 8.3 07/11/2016   HGB 14.7 07/11/2016   HCT 41.2 07/11/2016   MCV 95.2 07/11/2016   PLT 152 07/11/2016   CMP     Component Value Date/Time   NA 136 07/11/2016 1049   K 3.4 (L) 07/11/2016 1049   CL 105 07/11/2016 1049   CO2 24 07/11/2016 1049   GLUCOSE 143 (H) 07/11/2016 1049   BUN 13 07/11/2016 1049   CREATININE 0.99 07/11/2016 1049   CALCIUM 9.4 07/11/2016 1049   PROT 7.2 07/11/2016 1049   ALBUMIN 4.1 07/11/2016 1049   AST 27 07/11/2016 1049   ALT 63 07/11/2016 1049   ALKPHOS 72 07/11/2016 1049   BILITOT 0.8 07/11/2016 1049   GFRNONAA >60 07/11/2016 1049   GFRAA >60 07/11/2016 1049   RADIOLOGY: I have reviewed the images below and agree with the reported results.  ASSESSMENT & PLAN:  Multiple myeloma, IgG kappa Bone marrow transplant at [facility] on  10/24/2013 Maintenance Velcade Tobacco Use Insomnia Chronic Pain  The patient has finished 2 years of maintenance Velcade. M-spike nondetectable on last SPEP in December 2017. No evidence of cytopenias. Labs from today pending, will follow up.  I advised the patient to slowly increase his activity level to increase his stamina and reduce his fatigue. The patient is in agreement with this.  Continue follow up with Dr. Ok Edwards, his transplant physician, in September 2018 as scheduled. He is uptodate on all vaccinations post transplant.   RTC in 3 months with CBC, CMP, kappa/lambda light chains, immunoglobulins, SPEP, and Beta-2 microglobulin.     All questions were answered. The patient knows to call the clinic with any problems, questions or concerns.  This document serves as a record of services personally performed by Twana First, MD. It was created on her behalf by Maryla Morrow, a trained medical scribe. The creation of this record is based on the scribe's personal observations and the provider's statements to them. This document has been checked and approved by the attending provider.  I have reviewed the above documentation for accuracy and completeness, and I agree with the above.  This note was electronically signed.

## 2016-07-18 NOTE — Progress Notes (Signed)
[place]  PROGRESS NOTE  Patient Care Team: Redmond School, MD as PCP - General (Internal Medicine)  CHIEF COMPLAINTS/PURPOSE OF CONSULTATION:  Multiple myeloma Bone marrow transplant at [facility] on 10/24/2013 On maintenance Velcade    Multiple myeloma ([place])   03/26/2013 Initial Diagnosis    Multiple myeloma ([place])      03/26/2013 Bone Marrow Biopsy    80% cellularity, predominantly plasma cells 90%, IgG kappa, 5400 mg/dL, with anemia, hemoglobin 8.2 g, normal calcium, normal kidney function, normal bone survey but elevated beta-2 microglobulin level-5.5, consistent with stage III disease      04/02/2013 - 08/01/2013 Chemotherapy    Velcade/dexamethasone      10/24/2013 Bone Marrow Transplant    SCT, autologous.  Day 0= 10/24/2013.      02/22/2014 - 03/26/2014 Chemotherapy    Lenalidomide 10 mg daily initiated for one month, and tolerated we'll increase to 15 mg daily.  Toxicity resulting in discontinuation.      03/26/2014 Adverse Reaction    Diffuse, worsening rash, aching and bones, joints, muscles, weakness, lenalidomide discontinued      05/06/2014 - 01/17/2016 Chemotherapy    Velcade 1.3 mg/m every 14 days as maintenance therapy.       HISTORY OF PRESENTING ILLNESS:  Craig Rivera 61 y.o. male is here for a follow-up of multiple myeloma, IgG kappa. He is s/p autologous transplant Day 0 10/24/2013.    INTERVAL HISTORY: The patient returns today for follow up. He is accompanied by his wife. The patient reports fatigue since finishing Velcade in December 2017. He has had to stop working because of this fatigue. 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 He reports a recent chest cold approximately 1 month ago. He denies any fever, chills. The patient's wife requests info about multiple myeloma support groups she could attend to help her husband through his recovery. July 18, 2016 Patient is here  for further follow-up regarding multiple myeloma status post high-dose chemotherapy and bone marrow transplants and finished maintenance Velcade therapy  Has number of complaints includes dizziness leg swelling itching constipation , nausea.  Feeling weak tired.  Fatigue. Using fentanyl patches every 48 hours and desires to gradually decrease the dose Pain is being a localizing low back area and rib cage area  MEDICAL HISTORY:  Past Medical History:  Diagnosis Date  . Back pain   . HTN (hypertension)   . Hypercholesterolemia   . Multiple myeloma ([place])   . Sciatic nerve pain     SURGICAL HISTORY: Past Surgical History:  Procedure Laterality Date  . BONE MARROW TRANSPLANT    . CATARACT EXTRACTION W/PHACO Right 08/11/2014   Procedure: CATARACT EXTRACTION PHACO AND INTRAOCULAR LENS PLACEMENT (IOC);  Surgeon: Williams Che, MD;  Location: [facility] ORS;  Service: Ophthalmology;  Laterality: Right;  CDE 4.88  . CATARACT EXTRACTION W/PHACO Left 10/26/2014   Procedure: CATARACT EXTRACTION PHACO AND INTRAOCULAR LENS PLACEMENT LEFT EYE CDE=8.64;  Surgeon: Williams Che, MD;  Location: [facility] ORS;  Service: Ophthalmology;  Laterality: Left;  . COLONOSCOPY  09/12/2010   Procedure: COLONOSCOPY;  Surgeon: Daneil Dolin, MD;  Location: [facility] ENDO SUITE;  Service: Endoscopy;  Laterality: N/A;  . inguinal hernia repair bilateral     as child   . KNEE SURGERY Right    arthroscopy  . TONSILLECTOMY      SOCIAL HISTORY: Social History   Social History  . Marital status:  Married    Spouse name: N/A  . Number of children: N/A  . Years of education: N/A   Occupational History  . Not on file.   Social History Main Topics  . Smoking status: Current Every Day Smoker    Packs/day: 0.50    Years: 40.00    Types: Cigarettes  . Smokeless tobacco: Never Used  . Alcohol use No  . Drug use: No  . Sexual activity: Yes    Birth control/ protection: None   Other Topics Concern  . Not on file   Social  History Narrative  . No narrative on file   Married for 25 years 4 children 9 grandchildren 1 pet, Boxer named Grace Smoker, 1 pack every 2 or 3 days. Started at 61 yo. States, he quit for a while.  He used to play golf, however the myeloma fatigue has kept him from playing Worked uptown at [employer]: Family History  Problem Relation Age of Onset  . Colon polyps Father        passed away, [place]  . Colon polyps Sister   . Migraines Mother        deceased   Mother deceased at 22 or 59 yo Father deceased at 57 yo  1 brother died to kidney disease in the 38s 1 sister passed away from [place] and a male cancer.  Oldest sister, 21 yo, is still living and has MS. Mostly healthy Neither parent had MS that he knows of. No family history of myeloma  ALLERGIES:  is allergic to penicillins; ceclor [cefaclor]; and lenalidomide.  MEDICATIONS:  Current Outpatient Prescriptions  Medication Sig Dispense Refill  . acetaminophen (TYLENOL) 325 MG tablet Take 650 mg by mouth every 6 (six) hours as needed for mild pain.     Marland Kitchen acyclovir (ZOVIRAX) 200 MG capsule Take 2 capsules (400 mg total) by mouth 2 (two) times daily. 360 capsule 1  . amLODipine (NORVASC) 10 MG tablet Take 10 mg by mouth daily.     . fentaNYL (DURAGESIC - DOSED MCG/HR) 75 MCG/HR Place 1 patch (75 mcg total) onto the skin every other day. 15 patch 0  . FIBER PO Take by mouth.    . Magnesium Oxide 250 MG TABS Take 1 tablet by mouth 3 (three) times daily.    Marland Kitchen oxyCODONE (OXY IR/ROXICODONE) 5 MG immediate release tablet Take 1 tablet (5 mg total) by mouth every 4 (four) hours as needed. pain 30 tablet 0  . tamsulosin (FLOMAX) 0.4 MG CAPS capsule Take 1 capsule by mouth 2 (two) times daily.  5   No current facility-administered medications for this visit.     Review of Systems  Constitutional: Positive for malaise/fatigue. Negative for chills and fever.  HENT: Negative.   Eyes: Negative.     Respiratory: Negative.  Negative for shortness of breath.   Cardiovascular: Negative.   Gastrointestinal: Negative.   Genitourinary: Negative.   Skin: Negative.   Neurological: Positive for tingling (Neuropathy on sole of left foot) and weakness.  Endo/Heme/Allergies: Negative.   All other systems reviewed and are negative.  14 point ROS was done and is otherwise as detailed above or in HPI   PHYSICAL EXAMINATION: ECOG PERFORMANCE STATUS: 1 - Symptomatic but completely ambulatory    Blood pressure is 1 43 x 84 Pulse 65 respirations 16   Physical Exam  Constitutional: He is oriented to person, place, and time and well-developed, well-nourished, and in no distress.  HENT:  Head:  Normocephalic and atraumatic.  Nose: Nose normal.  Mouth/Throat: Oropharynx is clear and moist.  Eyes: Conjunctivae and EOM are normal. Pupils are equal, round, and reactive to light.  Neck: Normal range of motion. Neck supple.  Cardiovascular: Normal rate, regular rhythm and normal heart sounds.   Pulmonary/Chest: Effort normal and breath sounds normal.  Abdominal: Soft. Bowel sounds are normal.  Musculoskeletal: Normal range of motion.  Neurological: He is alert and oriented to person, place, and time. He has normal reflexes. Gait normal.  Skin: Skin is warm and dry.  LLE old injury from mowing incident.  Psychiatric: Mood, memory, affect and judgment normal.  Nursing note and vitals reviewed.    LABORATORY DATA:  I have reviewed the data as listed Lab Results  Component Value Date   WBC 8.3 07/11/2016   HGB 14.7 07/11/2016   HCT 41.2 07/11/2016   MCV 95.2 07/11/2016   PLT 152 07/11/2016   CMP     Component Value Date/Time   NA 136 07/11/2016 1049   K 3.4 (L) 07/11/2016 1049   CL 105 07/11/2016 1049   CO2 24 07/11/2016 1049   GLUCOSE 143 (H) 07/11/2016 1049   BUN 13 07/11/2016 1049   CREATININE 0.99 07/11/2016 1049   CALCIUM 9.4 07/11/2016 1049   PROT 7.2 07/11/2016 1049    ALBUMIN 4.1 07/11/2016 1049   AST 27 07/11/2016 1049   ALT 63 07/11/2016 1049   ALKPHOS 72 07/11/2016 1049   BILITOT 0.8 07/11/2016 1049   GFRNONAA >60 07/11/2016 1049   GFRAA >60 07/11/2016 1049   RADIOLOGY: I have reviewed the images below and agree with the reported results.  ASSESSMENT & PLAN:  Multiple myeloma, IgG kappa Bone marrow transplant at [facility] on 10/24/2013 Maintenance Velcade Tobacco Use Insomnia Chronic Pain  The patient has finished 2 years of maintenance Velcade. M-spike nondetectable on last SPEP in December 2017. No evidence of cytopenias. Labs from today pending, will follow up.  I advised the patient to slowly increase his activity level to increase his stamina and reduce his fatigue. The patient is in agreement with this.  Continue follow up with Dr. Ok Edwards, his transplant physician, in September 2018 as scheduled. He is uptodate on all vaccinations post transplant.   RTC in 3 months with CBC, CMP, kappa/lambda light chains, immunoglobulins, SPEP, and Beta-2 microglobulin. We will try Zoloft to see whether that would help with some of the symptoms the patient has post chemotherapy.   Patient as advised to change fentanyl patch in the nighttime Possibility of cutting down fentanyl patch to 50 g would be considered.  Patient desires to think about it. All lab data has been reviewed Multiple myeloma is in remission     07/18/2016 2:49 PM

## 2016-08-03 ENCOUNTER — Other Ambulatory Visit (HOSPITAL_COMMUNITY): Payer: Self-pay | Admitting: Emergency Medicine

## 2016-08-03 DIAGNOSIS — C9000 Multiple myeloma not having achieved remission: Secondary | ICD-10-CM

## 2016-08-03 MED ORDER — FENTANYL 75 MCG/HR TD PT72: 75.0000 ug | MEDICATED_PATCH | TRANSDERMAL | 0 refills | Status: DC

## 2016-08-03 NOTE — Progress Notes (Signed)
Fentanyl refilled.  

## 2016-08-15 ENCOUNTER — Other Ambulatory Visit (HOSPITAL_COMMUNITY): Payer: Self-pay | Admitting: Oncology

## 2016-08-22 ENCOUNTER — Encounter (HOSPITAL_COMMUNITY): Payer: Self-pay | Admitting: Emergency Medicine

## 2016-08-22 ENCOUNTER — Other Ambulatory Visit (HOSPITAL_COMMUNITY): Payer: Self-pay | Admitting: Emergency Medicine

## 2016-08-22 ENCOUNTER — Other Ambulatory Visit (HOSPITAL_COMMUNITY): Payer: Self-pay | Admitting: Oncology

## 2016-08-22 DIAGNOSIS — C9000 Multiple myeloma not having achieved remission: Secondary | ICD-10-CM

## 2016-08-22 MED ORDER — OXYCODONE HCL 5 MG PO TABS: 5.0000 mg | ORAL_TABLET | ORAL | 0 refills | Status: DC | PRN

## 2016-08-22 MED ORDER — SERTRALINE HCL 50 MG PO TABS: 50.0000 mg | ORAL_TABLET | Freq: Every day | ORAL | 2 refills | Status: DC

## 2016-08-22 NOTE — Progress Notes (Signed)
Oxycodone refilled and zoloft.

## 2016-09-05 ENCOUNTER — Other Ambulatory Visit (HOSPITAL_COMMUNITY): Payer: Self-pay | Admitting: Emergency Medicine

## 2016-09-05 ENCOUNTER — Encounter (HOSPITAL_COMMUNITY): Payer: Self-pay | Admitting: Adult Health

## 2016-09-05 DIAGNOSIS — C9000 Multiple myeloma not having achieved remission: Secondary | ICD-10-CM

## 2016-09-05 MED ORDER — FENTANYL 75 MCG/HR TD PT72: 75.0000 ug | MEDICATED_PATCH | TRANSDERMAL | 0 refills | Status: DC

## 2016-09-05 NOTE — Progress Notes (Signed)
Patient called cancer center requesting refill of Fentanyl patch.   [place] Controlled Substance Reporting System reviewed and refill is appropriate on or after 09/05/16. Paper prescription printed & post-dated; Rx left at cancer center front desk for patient to retrieve after showing photo ID per clinic policy.   NCCSRS reviewed:     [name], NP [place] [phone]

## 2016-09-05 NOTE — Progress Notes (Signed)
Fentanyl refilled.  

## 2016-09-18 ENCOUNTER — Telehealth (HOSPITAL_COMMUNITY): Payer: Self-pay | Admitting: *Deleted

## 2016-09-20 NOTE — Telephone Encounter (Signed)
Left message for patient on cell phone to find out exactly what records need sending to [facility].

## 2016-09-22 NOTE — Telephone Encounter (Signed)
Faxed records to [place] [employer] [phone]) from 11/07/15 to present as requested by patient. Claim number: [id number]. Received fax confirmation that records transmitted correctly to [place].

## 2016-10-02 ENCOUNTER — Other Ambulatory Visit (HOSPITAL_COMMUNITY): Payer: Self-pay | Admitting: Adult Health

## 2016-10-02 ENCOUNTER — Encounter (HOSPITAL_COMMUNITY): Payer: Self-pay | Admitting: Adult Health

## 2016-10-02 DIAGNOSIS — C9000 Multiple myeloma not having achieved remission: Secondary | ICD-10-CM

## 2016-10-02 MED ORDER — FENTANYL 75 MCG/HR TD PT72: 75.0000 ug | MEDICATED_PATCH | TRANSDERMAL | 0 refills | Status: DC

## 2016-10-02 NOTE — Progress Notes (Signed)
Patient called cancer center requesting refill of Fentanyl patches.   [place] Controlled Substance Reporting System reviewed and refill is appropriate on or after 10/07/16. Paper prescription printed & post-dated; Rx left at cancer center front desk for patient to retrieve after showing photo ID per clinic policy.   NCCSRS reviewed:     [name], NP [place] [phone]

## 2016-10-11 ENCOUNTER — Other Ambulatory Visit (HOSPITAL_COMMUNITY): Payer: Self-pay | Admitting: *Deleted

## 2016-10-11 DIAGNOSIS — C9000 Multiple myeloma not having achieved remission: Secondary | ICD-10-CM

## 2016-10-12 ENCOUNTER — Other Ambulatory Visit (HOSPITAL_COMMUNITY): Payer: Self-pay

## 2016-10-18 ENCOUNTER — Other Ambulatory Visit (HOSPITAL_COMMUNITY): Payer: Self-pay | Admitting: Oncology

## 2016-10-18 DIAGNOSIS — C9000 Multiple myeloma not having achieved remission: Secondary | ICD-10-CM

## 2016-10-19 ENCOUNTER — Other Ambulatory Visit (HOSPITAL_COMMUNITY): Payer: Self-pay

## 2016-10-19 ENCOUNTER — Ambulatory Visit (HOSPITAL_COMMUNITY): Payer: Self-pay

## 2016-11-01 ENCOUNTER — Encounter (HOSPITAL_COMMUNITY): Payer: Self-pay | Admitting: Adult Health

## 2016-11-01 ENCOUNTER — Other Ambulatory Visit (HOSPITAL_COMMUNITY): Payer: Self-pay | Admitting: Adult Health

## 2016-11-01 DIAGNOSIS — C9000 Multiple myeloma not having achieved remission: Secondary | ICD-10-CM

## 2016-11-01 MED ORDER — FENTANYL 75 MCG/HR TD PT72: 75.0000 ug | MEDICATED_PATCH | TRANSDERMAL | 0 refills | Status: DC

## 2016-11-01 NOTE — Progress Notes (Signed)
Patient called cancer center requesting refill of Fentanyl patch.   [place] Controlled Substance Reporting System reviewed and refill is appropriate on or after 11/04/16. Paper prescription printed & post-dated; Rx left at cancer center front desk for patient to retrieve after showing photo ID per clinic policy.   NCCSRS reviewed:     [name], NP [place] [phone]

## 2016-11-01 NOTE — Progress Notes (Signed)
refill 

## 2016-11-13 ENCOUNTER — Other Ambulatory Visit (HOSPITAL_COMMUNITY): Payer: Self-pay | Admitting: Oncology

## 2016-11-13 DIAGNOSIS — C9000 Multiple myeloma not having achieved remission: Secondary | ICD-10-CM

## 2016-11-17 ENCOUNTER — Encounter (HOSPITAL_COMMUNITY): Payer: No Typology Code available for payment source

## 2016-11-17 ENCOUNTER — Encounter (HOSPITAL_COMMUNITY)
Payer: No Typology Code available for payment source | Attending: Hematology and Oncology | Admitting: Hematology and Oncology

## 2016-11-17 ENCOUNTER — Encounter (HOSPITAL_COMMUNITY): Payer: Self-pay | Admitting: Hematology and Oncology

## 2016-11-17 DIAGNOSIS — C9000 Multiple myeloma not having achieved remission: Secondary | ICD-10-CM | POA: Diagnosis not present

## 2016-11-17 DIAGNOSIS — C9001 Multiple myeloma in remission: Secondary | ICD-10-CM

## 2016-11-17 DIAGNOSIS — R42 Dizziness and giddiness: Secondary | ICD-10-CM

## 2016-11-17 LAB — COMPREHENSIVE METABOLIC PANEL
ALBUMIN: 4 g/dL (ref 3.5–5.0)
ALK PHOS: 65 U/L (ref 38–126)
ALT: 22 U/L (ref 17–63)
AST: 23 U/L (ref 15–41)
Anion gap: 8 (ref 5–15)
BUN: 12 mg/dL (ref 6–20)
CHLORIDE: 99 mmol/L — AB (ref 101–111)
CO2: 31 mmol/L (ref 22–32)
CREATININE: 0.77 mg/dL (ref 0.61–1.24)
Calcium: 9.2 mg/dL (ref 8.9–10.3)
GFR calc Af Amer: >60 mL/min (ref >60)
GFR calc non Af Amer: >60 mL/min (ref >60)
Glucose, Bld: 130 mg/dL — ABNORMAL HIGH (ref 65–99)
Potassium: 3.4 mmol/L — ABNORMAL LOW (ref 3.5–5.1)
Sodium: 138 mmol/L (ref 135–145)
Total Bilirubin: 0.6 mg/dL (ref 0.3–1.2)
Total Protein: 7.1 g/dL (ref 6.5–8.1)

## 2016-11-17 LAB — CBC WITH DIFFERENTIAL/PLATELET
BASOS ABS: 0 10*3/uL (ref 0.0–0.1)
BASOS PCT: 0 %
EOS ABS: 0.2 10*3/uL (ref 0.0–0.7)
EOS PCT: 3 %
HCT: 43.8 % (ref 39.0–52.0)
HEMOGLOBIN: 15.5 g/dL (ref 13.0–17.0)
LYMPHS ABS: 1.9 10*3/uL (ref 0.7–4.0)
Lymphocytes Relative: 25 %
MCH: 33.5 pg (ref 26.0–34.0)
MCHC: 35.4 g/dL (ref 30.0–36.0)
MCV: 94.8 fL (ref 78.0–100.0)
Monocytes Absolute: 0.6 10*3/uL (ref 0.1–1.0)
Monocytes Relative: 7 %
NEUTROS PCT: 65 %
Neutro Abs: 5 10*3/uL (ref 1.7–7.7)
PLATELETS: 177 10*3/uL (ref 150–400)
RBC: 4.62 MIL/uL (ref 4.22–5.81)
RDW: 13.2 % (ref 11.5–15.5)
WBC: 7.8 10*3/uL (ref 4.0–10.5)

## 2016-11-17 MED ORDER — OXYCODONE HCL 5 MG PO TABS: 5.0000 mg | ORAL_TABLET | ORAL | 0 refills | Status: DC | PRN

## 2016-11-19 LAB — IGG, IGA, IGM
IGA: 111 mg/dL (ref 61–437)
IGG (IMMUNOGLOBIN G), SERUM: 1262 mg/dL (ref 700–1600)
IgM (Immunoglobulin M), Srm: 21 mg/dL (ref 20–172)

## 2016-11-19 LAB — BETA 2 MICROGLOBULIN, SERUM: BETA 2 MICROGLOBULIN: 2.1 mg/L (ref 0.6–2.4)

## 2016-11-20 LAB — PROTEIN ELECTROPHORESIS, SERUM
A/G RATIO SPE: 1.1 (ref 0.7–1.7)
ALBUMIN ELP: 3.6 g/dL (ref 2.9–4.4)
ALPHA-1-GLOBULIN: 0.3 g/dL (ref 0.0–0.4)
ALPHA-2-GLOBULIN: 0.7 g/dL (ref 0.4–1.0)
Beta Globulin: 1 g/dL (ref 0.7–1.3)
GLOBULIN, TOTAL: 3.3 g/dL (ref 2.2–3.9)
Gamma Globulin: 1.3 g/dL (ref 0.4–1.8)
M-SPIKE, %: 0.1 g/dL — AB
TOTAL PROTEIN ELP: 6.9 g/dL (ref 6.0–8.5)

## 2016-11-20 LAB — KAPPA/LAMBDA LIGHT CHAINS
KAPPA FREE LGHT CHN: 18.9 mg/L (ref 3.3–19.4)
KAPPA, LAMDA LIGHT CHAIN RATIO: 1.26 (ref 0.26–1.65)
LAMDA FREE LIGHT CHAINS: 15 mg/L (ref 5.7–26.3)

## 2016-11-20 LAB — IMMUNOFIXATION ELECTROPHORESIS
IGA: 112 mg/dL (ref 61–437)
IgG (Immunoglobin G), Serum: 1139 mg/dL (ref 700–1600)
IgM (Immunoglobulin M), Srm: 19 mg/dL — ABNORMAL LOW (ref 20–172)
Total Protein ELP: 6.7 g/dL (ref 6.0–8.5)

## 2016-11-21 ENCOUNTER — Telehealth (HOSPITAL_COMMUNITY): Payer: Self-pay | Admitting: Adult Health

## 2016-11-21 NOTE — Telephone Encounter (Signed)
Called and spoke with Craig Rivera at [facility] with BMT team.  Shared with her my concerns regarding our mutual patient, Craig Rivera and possible relapsing disease.    New evidence of M-spike of 0.1%; IFE with IgG monoclonal protein with light chain specificity.  Mandy to send message to Dr. Ok Edwards, as he is out of the office this week. She will return call to our office (main number given: [phone]) and speak with either our nursing staff or myself/Dr. Talbert Cage.   [name], NP [place] [phone]

## 2016-11-22 ENCOUNTER — Telehealth (HOSPITAL_COMMUNITY): Payer: Self-pay | Admitting: Adult Health

## 2016-11-22 NOTE — Telephone Encounter (Signed)
Received return call from [facility] BMT team re: plan given lab concerns for relapsed disease post transplant.  Dr. Ok Edwards would like to see the patient in November to re-establish care.  Appt at [facility] has been made for 12/12/16 with labs at 11:00 am and visit with Dr. Ok Edwards at 11:30 am.  Caller states that [facility] should call patient with appointment, but I will give him a call to let him know as well.   I was able to speak directly with the patient re: his myeloma labs. I shared with him that his M-spike is now 0.1% and IFE is concerning for relapsed disease. We would like him to reconnect with his BMT team at [facility] to discuss additional treatment strategies moving forward.  I shared with him the appointment and he told me that [facility] had already contacted him with the appt and he is aware.  He voiced understanding and agrees with plan.   Encouraged him to call us with any additional questions or concerns.  We are currently scheduled to see him again for follow-up in 02/6107, but can certainly see him sooner per Dr. Octaviano Batty recommendations, if needed.    [name], NP [place] [phone]

## 2016-11-23 NOTE — Assessment & Plan Note (Addendum)
60 y.o. male with previous diagnosis of multiple myeloma having completed the intense course of therapy including [place] rescue after high dose chemotherapy for consolidation as well as a full course of bortezomib maintenance.  Labs obtained prior to this visit demonstrate appearance of a very faint possible monoclonal spike on S prep which was previously absent. Numerically, immunoglobulins and light chain ratio remains normal, no new hematological abnormalities noted.  The symptoms of lightheadedness and dizziness the patient has experienced are likely connected to dehydration as he has congressional habits are suboptimal and he only experiences the symptoms while he is outside in the heat. Nevertheless, due to risk of developing cardiovascular complications from precedent treatment, initial evaluation with EKG and echocardiogram are warranted.  Plan: --EKG, ECHO --PET-CT & repeat BM Bx for relapse eval. --If negative, repeat labs for MM relapse 2-3 days prior to RTC --RTC 3 months to review 

## 2016-11-23 NOTE — Progress Notes (Signed)
[facility] Follow-up Visit:  Assessment: Multiple myeloma ([place]) 61 y.o. male with previous diagnosis of multiple myeloma having completed the intense course of therapy including [place] rescue after high dose chemotherapy for consolidation as well as a full course of bortezomib maintenance.  Labs obtained prior to this visit demonstrate appearance of a very faint possible monoclonal spike on S prep which was previously absent. Numerically, immunoglobulins and light chain ratio remains normal, no new hematological abnormalities noted.  The symptoms of lightheadedness and dizziness the patient has experienced are likely connected to dehydration as he has congressional habits are suboptimal and he only experiences the symptoms while he is outside in the heat. Nevertheless, due to risk of developing cardiovascular complications from precedent treatment, initial evaluation with EKG and echocardiogram are warranted.  Plan: --EKG, ECHO --PET-CT & repeat BM Bx for relapse eval. --If negative, repeat labs for MM relapse 2-3 days prior to RTC --RTC 3 months to review  Voice recognition software was used and creation of this note. Despite my best effort at editing the text, some misspelling/errors may have occurred.   Orders Placed This Encounter  Procedures  . CBC with Differential    Standing Status:   Future    Standing Expiration Date:   11/17/2017  . Comprehensive metabolic panel    Standing Status:   Future    Standing Expiration Date:   11/17/2017  . Lactate dehydrogenase (LDH)    Standing Status:   Future    Standing Expiration Date:   11/17/2017  . Beta 2 microglobulin    Standing Status:   Future    Standing Expiration Date:   11/17/2017  . Kappa/lambda light chains    Standing Status:   Future    Standing Expiration Date:   11/17/2017  . Multiple Myeloma Panel (SPEP&IFE w/QIG)    Standing Status:   Future    Standing Expiration Date:   11/17/2017  . EKG  12-Lead    Order Specific Question:   Where should this test be performed    Answer:   [facility]  . ECHOCARDIOGRAM COMPLETE    Standing Status:   Future    Standing Expiration Date:   02/17/2018    Order Specific Question:   Where should this test be performed    Answer:   [facility]    Order Specific Question:   Perflutren DEFINITY (image enhancing agent) should be administered unless hypersensitivity or allergy exist    Answer:   Administer Perflutren    Order Specific Question:   Expected Date:    Answer:   1 week    Cancer Staging No matching staging information was found for the patient.  All questions were answered.  . The patient knows to call the clinic with any problems, questions or concerns.  This note was electronically signed.    History of Presenting Illness Craig Rivera 61 y.o. presenting to the [facility] [place] for Relapse monitoring for multiple myeloma previously treated with induction followed by high-dose chemotherapy/follicle stem cell rescue consolidation and bortezomib maintenance. Patient reports a 5 pound weight loss since last visit to the clinic. Also complains of some lightheadedness, dizziness without chest pain, palpitations, or shortness of breath while working his yard outside. Denies any nausea, vomiting, abdominal pain, no change in activity level, no decrease in exercise tolerance.  Oncological/hematological History:   Multiple myeloma ([place])   03/26/2013 Initial Diagnosis    Multiple myeloma ([place])      03/26/2013 Bone  Marrow Biopsy    80% cellularity, predominantly plasma cells 90%, IgG kappa, 5400 mg/dL, with anemia, hemoglobin 8.2 g, normal calcium, normal kidney function, normal bone survey but elevated beta-2 microglobulin level-5.5, consistent with stage III disease      04/02/2013 - 08/01/2013 Chemotherapy    Velcade/dexamethasone      10/24/2013 Bone Marrow Transplant    SCT, autologous.  Day 0= 10/24/2013.      02/22/2014 - 03/26/2014  Chemotherapy    Lenalidomide 10 mg daily initiated for one month, and tolerated we'll increase to 15 mg daily.  Toxicity resulting in discontinuation.      03/26/2014 Adverse Reaction    Diffuse, worsening rash, aching and bones, joints, muscles, weakness, lenalidomide discontinued      05/06/2014 - 01/17/2016 Chemotherapy    Velcade 1.3 mg/m every 14 days as maintenance therapy.       Medical History: Past Medical History:  Diagnosis Date  . Back pain   . HTN (hypertension)   . Hypercholesterolemia   . Multiple myeloma ([place])   . Sciatic nerve pain     Surgical History: Past Surgical History:  Procedure Laterality Date  . BONE MARROW TRANSPLANT    . CATARACT EXTRACTION W/PHACO Right 08/11/2014   Procedure: CATARACT EXTRACTION PHACO AND INTRAOCULAR LENS PLACEMENT (IOC);  Surgeon: Williams Che, MD;  Location: [facility];  Service: Ophthalmology;  Laterality: Right;  CDE 4.88  . CATARACT EXTRACTION W/PHACO Left 10/26/2014   Procedure: CATARACT EXTRACTION PHACO AND INTRAOCULAR LENS PLACEMENT LEFT EYE CDE=8.64;  Surgeon: Williams Che, MD;  Location: [facility];  Service: Ophthalmology;  Laterality: Left;  . COLONOSCOPY  09/12/2010   Procedure: COLONOSCOPY;  Surgeon: Daneil Dolin, MD;  Location: [facility];  Service: Endoscopy;  Laterality: N/A;  . inguinal hernia repair bilateral     as child   . KNEE SURGERY Right    arthroscopy  . TONSILLECTOMY      Family History: Family History  Problem Relation Age of Onset  . Colon polyps Father        passed away, [place]  . Colon polyps Sister   . Migraines Mother        deceased    Social History: Social History   Social History  . Marital status: Married    Spouse name: N/A  . Number of children: N/A  . Years of education: N/A   Occupational History  . Not on file.   Social History Main Topics  . Smoking status: Current Every Day Smoker    Packs/day: 0.50    Years: 40.00    Types: Cigarettes  . Smokeless  tobacco: Never Used  . Alcohol use No  . Drug use: No  . Sexual activity: Yes    Birth control/ protection: None   Other Topics Concern  . Not on file   Social History Narrative  . No narrative on file    Allergies: Allergies  Allergen Reactions  . Penicillins Diarrhea  . Ceclor [Cefaclor] Diarrhea and Rash  . Lenalidomide Rash    Medications:  Current Outpatient Prescriptions  Medication Sig Dispense Refill  . acetaminophen (TYLENOL) 325 MG tablet Take 650 mg by mouth every 6 (six) hours as needed for mild pain.     Marland Kitchen acyclovir (ZOVIRAX) 200 MG capsule TAKE 2 CAPSULES BY MOUTH 2 TIMES DAILY. 360 capsule 1  . amLODipine (NORVASC) 10 MG tablet Take 10 mg by mouth daily.     . fentaNYL (DURAGESIC - DOSED MCG/HR)  75 MCG/HR Place 1 patch (75 mcg total) onto the skin every other day. 15 patch 0  . FIBER PO Take by mouth.    . Magnesium Oxide 250 MG TABS Take 1 tablet by mouth 3 (three) times daily.    Marland Kitchen oxyCODONE (OXY IR/ROXICODONE) 5 MG immediate release tablet Take 1 tablet (5 mg total) by mouth every 4 (four) hours as needed. pain 30 tablet 0  . sertraline (ZOLOFT) 50 MG tablet TAKE 1 TABLET BY MOUTH EVERY DAY 30 tablet 2  . tamsulosin (FLOMAX) 0.4 MG CAPS capsule Take 1 capsule by mouth 2 (two) times daily.  5   No current facility-administered medications for this visit.     Review of Systems: Review of Systems  Constitutional: Positive for unexpected weight change.  Neurological: Positive for dizziness and light-headedness.  All other systems reviewed and are negative.    PHYSICAL EXAMINATION There were no vitals taken for this visit.  ECOG PERFORMANCE STATUS: 1 - Symptomatic but completely ambulatory  Physical Exam  Constitutional: He is oriented to person, place, and time and well-developed, well-nourished, and in no distress.  HENT:  Head: Normocephalic and atraumatic.  Nose: Nose normal.  Mouth/Throat: Oropharynx is clear and moist.  Eyes: Pupils are  equal, round, and reactive to light. Conjunctivae and EOM are normal.  Neck: Normal range of motion. Neck supple.  Cardiovascular: Normal rate, regular rhythm and normal heart sounds.   Pulmonary/Chest: Effort normal and breath sounds normal.  Abdominal: Soft. Bowel sounds are normal.  Musculoskeletal: Normal range of motion.  Neurological: He is alert and oriented to person, place, and time. He has normal reflexes. Gait normal.  Skin: Skin is warm and dry.  LLE old injury from mowing incident.  Psychiatric: Mood, memory, affect and judgment normal.  Nursing note and vitals reviewed.    LABORATORY DATA: I have personally reviewed the data as listed: Appointment on 11/17/2016  Component Date Value Ref Range Status  . WBC 11/17/2016 7.8  4.0 - 10.5 K/uL Final  . RBC 11/17/2016 4.62  4.22 - 5.81 MIL/uL Final  . Hemoglobin 11/17/2016 15.5  13.0 - 17.0 g/dL Final  . HCT 11/17/2016 43.8  39.0 - 52.0 % Final  . MCV 11/17/2016 94.8  78.0 - 100.0 fL Final  . MCH 11/17/2016 33.5  26.0 - 34.0 pg Final  . MCHC 11/17/2016 35.4  30.0 - 36.0 g/dL Final  . RDW 11/17/2016 13.2  11.5 - 15.5 % Final  . Platelets 11/17/2016 177  150 - 400 K/uL Final  . Neutrophils Relative % 11/17/2016 65  % Final  . Neutro Abs 11/17/2016 5.0  1.7 - 7.7 K/uL Final  . Lymphocytes Relative 11/17/2016 25  % Final  . Lymphs Abs 11/17/2016 1.9  0.7 - 4.0 K/uL Final  . Monocytes Relative 11/17/2016 7  % Final  . Monocytes Absolute 11/17/2016 0.6  0.1 - 1.0 K/uL Final  . Eosinophils Relative 11/17/2016 3  % Final  . Eosinophils Absolute 11/17/2016 0.2  0.0 - 0.7 K/uL Final  . Basophils Relative 11/17/2016 0  % Final  . Basophils Absolute 11/17/2016 0.0  0.0 - 0.1 K/uL Final  . Sodium 11/17/2016 138  135 - 145 mmol/L Final  . Potassium 11/17/2016 3.4* 3.5 - 5.1 mmol/L Final  . Chloride 11/17/2016 99* 101 - 111 mmol/L Final  . CO2 11/17/2016 31  22 - 32 mmol/L Final  . Glucose, Bld 11/17/2016 130* 65 - 99 mg/dL Final  .  BUN 11/17/2016 12  6 - 20 mg/dL Final  . Creatinine, Ser 11/17/2016 0.77  0.61 - 1.24 mg/dL Final  . Calcium 11/17/2016 9.2  8.9 - 10.3 mg/dL Final  . Total Protein 11/17/2016 7.1  6.5 - 8.1 g/dL Final  . Albumin 11/17/2016 4.0  3.5 - 5.0 g/dL Final  . AST 11/17/2016 23  15 - 41 U/L Final  . ALT 11/17/2016 22  17 - 63 U/L Final  . Alkaline Phosphatase 11/17/2016 65  38 - 126 U/L Final  . Total Bilirubin 11/17/2016 0.6  0.3 - 1.2 mg/dL Final  . GFR calc non Af Amer 11/17/2016 >60  >60 mL/min Final  . GFR calc Af Amer 11/17/2016 >60  >60 mL/min Final   Comment: (NOTE) The eGFR has been calculated using the CKD EPI equation. This calculation has not been validated in all clinical situations. eGFR's persistently <60 mL/min signify possible Chronic Kidney Disease.   . Anion gap 11/17/2016 8  5 - 15 Final  . Kappa free light chain 11/17/2016 18.9  3.3 - 19.4 mg/L Final  . Lamda free light chains 11/17/2016 15.0  5.7 - 26.3 mg/L Final  . Kappa, lamda light chain ratio 11/17/2016 1.26  0.26 - 1.65 Final   Comment: (NOTE) Performed At: [facility] [place], [place] [id number] Lindon Romp MD [id number]   . Beta-2 Microglobulin 11/17/2016 2.1  0.6 - 2.4 mg/L Final   Comment: (NOTE) Siemens Immulite 2000 Immunochemiluminometric assay [facility]) Performed At: [facility] [place], [place] [id number] Lindon Romp MD [id number]   . IgG (Immunoglobin G), Serum 11/17/2016 1262  700 - 1,600 mg/dL Final  . IgA 11/17/2016 111  61 - 437 mg/dL Final  . IgM (Immunoglobulin M), Srm 11/17/2016 21  20 - 172 mg/dL Final   Comment: (NOTE) Result confirmed on concentration. Performed At: [facility] [place], [place] [id number] Lindon Romp MD [id number]   . Total Protein ELP 11/17/2016 6.7  6.0 - 8.5 g/dL Final  . IgG (Immunoglobin G), Serum 11/17/2016 1139  700 - 1,600 mg/dL Final  . IgA 11/17/2016 112  61 - 437  mg/dL Final  . IgM (Immunoglobulin M), Srm 11/17/2016 19* 20 - 172 mg/dL Final   Comment: (NOTE) Result confirmed on concentration. Performed At: [facility] [place], [place] [id number] Lindon Romp MD [id number]   . Immunofixation Result, Serum 11/17/2016 Comment   Corrected   Comment: (NOTE) Immunofixation shows IgG monoclonal protein with kappa light chain specificity.   . Total Protein ELP 11/17/2016 6.9  6.0 - 8.5 g/dL Final  . Albumin ELP 11/17/2016 3.6  2.9 - 4.4 g/dL Final  . Alpha-1-Globulin 11/17/2016 0.3  0.0 - 0.4 g/dL Final  . Alpha-2-Globulin 11/17/2016 0.7  0.4 - 1.0 g/dL Final  . Beta Globulin 11/17/2016 1.0  0.7 - 1.3 g/dL Final  . Gamma Globulin 11/17/2016 1.3  0.4 - 1.8 g/dL Final  . M-Spike, % 11/17/2016 0.1* Not Observed g/dL Final  . SPE Interp. 11/17/2016 Comment   Final   Comment: (NOTE) Faint band in gamma region suspicious for monoclonal immunoglobulin. Performed At: [facility] [place], [place] [id number] Lindon Romp MD [id number]   . Comment 11/17/2016 Comment   Final   Comment: (NOTE) Protein electrophoresis scan will follow via computer, mail, or courier delivery.   Marland Kitchen GLOBULIN, TOTAL 11/17/2016 3.3  2.2 - 3.9 g/dL Corrected  . A/G Ratio 11/17/2016 1.1  0.7 -  1.7 Corrected       Ardath Sax, MD

## 2016-11-24 ENCOUNTER — Other Ambulatory Visit (HOSPITAL_COMMUNITY): Payer: Self-pay

## 2016-11-27 ENCOUNTER — Ambulatory Visit (HOSPITAL_COMMUNITY)
Admission: RE | Admit: 2016-11-27 | Discharge: 2016-11-27 | Disposition: A | Discharge status: Home / Self Care | Payer: PRIVATE HEALTH INSURANCE | Source: Ambulatory Visit | Attending: Hematology and Oncology | Admitting: Hematology and Oncology

## 2016-11-27 DIAGNOSIS — I119 Hypertensive heart disease without heart failure: Secondary | ICD-10-CM | POA: Diagnosis not present

## 2016-11-27 DIAGNOSIS — C9000 Multiple myeloma not having achieved remission: Secondary | ICD-10-CM | POA: Insufficient documentation

## 2016-11-27 DIAGNOSIS — R42 Dizziness and giddiness: Secondary | ICD-10-CM | POA: Insufficient documentation

## 2016-11-27 DIAGNOSIS — E7800 Pure hypercholesterolemia, unspecified: Secondary | ICD-10-CM | POA: Diagnosis not present

## 2016-11-27 LAB — ECHOCARDIOGRAM COMPLETE
E/e' ratio: 7.29
EWDT: 250 ms
FS: 43 % (ref 28–44)
IVS/LV PW RATIO, ED: 0.99
LA diam end sys: 43 mm
LA vol A4C: 65.9 ml
LA vol index: 32 mL/m2
LADIAMINDEX: 1.94 cm/m2
LASIZE: 43 mm
LAVOL: 70.8 mL
LDCA: 3.46 cm2
LV E/e' medial: 7.29
LV E/e'average: 7.29
LV SIMPSON'S DISK: 71
LV TDI E'MEDIAL: 7.18
LV dias vol index: 48 mL/m2
LV e' LATERAL: 10.3 cm/s
LVDIAVOL: 106 mL (ref 62–150)
LVOT SV: 108 mL
LVOT VTI: 31.2 cm
LVOT peak grad rest: 8 mmHg
LVOT peak vel: 142 cm/s
LVOTD: 21 mm
LVSYSVOL: 30 mL (ref 21–61)
LVSYSVOLIN: 14 mL/m2
MV Dec: 250
MV Peak grad: 2 mmHg
MV pk A vel: 70.3 m/s
MVPKEVEL: 75.1 m/s
PW: 10.8 mm — AB (ref 0.6–1.1)
RV TAPSE: 26 mm
RV sys press: 26 mmHg
Reg peak vel: 214 cm/s
Stroke v: 76 ml
TDI e' lateral: 10.3
TRMAXVEL: 214 cm/s

## 2016-11-27 NOTE — Progress Notes (Signed)
*  PRELIMINARY RESULTS* Echocardiogram 2D Echocardiogram has been performed.  Samuel Germany 11/27/2016, 11:53 AM

## 2016-12-06 ENCOUNTER — Other Ambulatory Visit (HOSPITAL_COMMUNITY): Payer: Self-pay | Admitting: *Deleted

## 2016-12-06 DIAGNOSIS — C9000 Multiple myeloma not having achieved remission: Secondary | ICD-10-CM

## 2016-12-06 MED ORDER — FENTANYL 75 MCG/HR TD PT72: 75.0000 ug | MEDICATED_PATCH | TRANSDERMAL | 0 refills | Status: DC

## 2016-12-11 ENCOUNTER — Other Ambulatory Visit (HOSPITAL_COMMUNITY): Payer: Self-pay | Admitting: *Deleted

## 2016-12-11 DIAGNOSIS — C9000 Multiple myeloma not having achieved remission: Secondary | ICD-10-CM

## 2016-12-12 ENCOUNTER — Other Ambulatory Visit (HOSPITAL_COMMUNITY): Payer: Self-pay

## 2016-12-12 DIAGNOSIS — C9000 Multiple myeloma not having achieved remission: Secondary | ICD-10-CM

## 2016-12-12 MED ORDER — SERTRALINE HCL 50 MG PO TABS: 50.0000 mg | ORAL_TABLET | Freq: Every day | ORAL | 2 refills | Status: DC

## 2016-12-12 MED ORDER — SERTRALINE HCL 50 MG PO TABS: 50.0000 mg | ORAL_TABLET | Freq: Every day | ORAL | 1 refills | Status: DC

## 2016-12-12 NOTE — Telephone Encounter (Signed)
Received refill request from patients pharmacy for 90 day supply os Zoloft. Reviewed with provider, chart checked and refilled.

## 2017-01-03 ENCOUNTER — Other Ambulatory Visit (HOSPITAL_COMMUNITY): Payer: Self-pay | Admitting: Emergency Medicine

## 2017-01-03 DIAGNOSIS — C9000 Multiple myeloma not having achieved remission: Secondary | ICD-10-CM

## 2017-01-03 MED ORDER — FENTANYL 75 MCG/HR TD PT72: 75.0000 ug | MEDICATED_PATCH | TRANSDERMAL | 0 refills | Status: DC

## 2017-01-03 NOTE — Progress Notes (Unsigned)
Fentanyl refilled.  

## 2017-01-24 ENCOUNTER — Ambulatory Visit (HOSPITAL_COMMUNITY): Payer: Self-pay | Admitting: Adult Health

## 2017-02-07 ENCOUNTER — Other Ambulatory Visit: Payer: Self-pay | Admitting: Oncology

## 2017-02-07 ENCOUNTER — Telehealth (HOSPITAL_COMMUNITY): Payer: Self-pay | Admitting: Emergency Medicine

## 2017-02-07 ENCOUNTER — Other Ambulatory Visit (HOSPITAL_COMMUNITY): Payer: Self-pay | Admitting: Emergency Medicine

## 2017-02-07 DIAGNOSIS — C9000 Multiple myeloma not having achieved remission: Secondary | ICD-10-CM

## 2017-02-07 MED ORDER — FENTANYL 75 MCG/HR TD PT72: 75.0000 ug | MEDICATED_PATCH | TRANSDERMAL | 0 refills | Status: DC

## 2017-02-07 MED ORDER — OXYCODONE HCL 5 MG PO TABS: 5.0000 mg | ORAL_TABLET | ORAL | 0 refills | Status: DC | PRN

## 2017-02-07 NOTE — Progress Notes (Signed)
Pt wanted Fentanyl and oxycodone to go to [facility].  [facility] cannot transfer the F[facility]anyl or oxycodone prescription them selves only the doctor.  RN called and [facility] the [facility] [place].  Fentanyl and oxycodone prescription printed for the pt and Faythe Casa NP signed.

## 2017-02-07 NOTE — Telephone Encounter (Signed)
Fentanyl refilled and oxycodone.  [facility] [place].

## 2017-02-07 NOTE — Progress Notes (Signed)
Refilled Fentanyl and Oxycodone.   Faythe Casa, NP 02/07/2017 9:27 AM

## 2017-02-12 ENCOUNTER — Other Ambulatory Visit (HOSPITAL_COMMUNITY): Payer: Self-pay | Admitting: *Deleted

## 2017-02-13 ENCOUNTER — Inpatient Hospital Stay (HOSPITAL_COMMUNITY): Payer: No Typology Code available for payment source | Attending: Oncology

## 2017-02-13 DIAGNOSIS — C9000 Multiple myeloma not having achieved remission: Secondary | ICD-10-CM | POA: Insufficient documentation

## 2017-02-13 DIAGNOSIS — C9001 Multiple myeloma in remission: Secondary | ICD-10-CM

## 2017-02-13 LAB — CBC WITH DIFFERENTIAL/PLATELET
Basophils Absolute: 0 10*3/uL (ref 0.0–0.1)
Basophils Relative: 1 %
EOS ABS: 0.2 10*3/uL (ref 0.0–0.7)
Eosinophils Relative: 4 %
HEMATOCRIT: 41.4 % (ref 39.0–52.0)
HEMOGLOBIN: 14.3 g/dL (ref 13.0–17.0)
LYMPHS ABS: 1.4 10*3/uL (ref 0.7–4.0)
Lymphocytes Relative: 25 %
MCH: 32.9 pg (ref 26.0–34.0)
MCHC: 34.5 g/dL (ref 30.0–36.0)
MCV: 95.2 fL (ref 78.0–100.0)
MONO ABS: 0.6 10*3/uL (ref 0.1–1.0)
Monocytes Relative: 11 %
NEUTROS ABS: 3.4 10*3/uL (ref 1.7–7.7)
NEUTROS PCT: 59 %
Platelets: 156 10*3/uL (ref 150–400)
RBC: 4.35 MIL/uL (ref 4.22–5.81)
RDW: 13.4 % (ref 11.5–15.5)
WBC: 5.6 10*3/uL (ref 4.0–10.5)

## 2017-02-13 LAB — COMPREHENSIVE METABOLIC PANEL
ALK PHOS: 63 U/L (ref 38–126)
ALT: 30 U/L (ref 17–63)
ANION GAP: 12 (ref 5–15)
AST: 25 U/L (ref 15–41)
Albumin: 3.8 g/dL (ref 3.5–5.0)
BILIRUBIN TOTAL: 0.6 mg/dL (ref 0.3–1.2)
BUN: 12 mg/dL (ref 6–20)
CALCIUM: 9 mg/dL (ref 8.9–10.3)
CO2: 23 mmol/L (ref 22–32)
Chloride: 100 mmol/L — ABNORMAL LOW (ref 101–111)
Creatinine, Ser: 0.8 mg/dL (ref 0.61–1.24)
GFR calc non Af Amer: >60 mL/min (ref >60)
Glucose, Bld: 142 mg/dL — ABNORMAL HIGH (ref 65–99)
Potassium: 3.4 mmol/L — ABNORMAL LOW (ref 3.5–5.1)
Sodium: 135 mmol/L (ref 135–145)
TOTAL PROTEIN: 6.9 g/dL (ref 6.5–8.1)

## 2017-02-13 LAB — LACTATE DEHYDROGENASE: LDH: 124 U/L (ref 98–192)

## 2017-02-14 LAB — KAPPA/LAMBDA LIGHT CHAINS
KAPPA, LAMDA LIGHT CHAIN RATIO: 2.36 — AB (ref 0.26–1.65)
Kappa free light chain: 29.7 mg/L — ABNORMAL HIGH (ref 3.3–19.4)
Lambda free light chains: 12.6 mg/L (ref 5.7–26.3)

## 2017-02-14 LAB — BETA 2 MICROGLOBULIN, SERUM: Beta-2 Microglobulin: 2 mg/L (ref 0.6–2.4)

## 2017-02-15 LAB — MULTIPLE MYELOMA PANEL, SERUM
ALBUMIN SERPL ELPH-MCNC: 3.8 g/dL (ref 2.9–4.4)
Albumin/Glob SerPl: 1.3 (ref 0.7–1.7)
Alpha 1: 0.2 g/dL (ref 0.0–0.4)
Alpha2 Glob SerPl Elph-Mcnc: 0.7 g/dL (ref 0.4–1.0)
B-Globulin SerPl Elph-Mcnc: 0.9 g/dL (ref 0.7–1.3)
Gamma Glob SerPl Elph-Mcnc: 1.2 g/dL (ref 0.4–1.8)
Globulin, Total: 3 g/dL (ref 2.2–3.9)
IGG (IMMUNOGLOBIN G), SERUM: 1384 mg/dL (ref 700–1600)
IgA: 103 mg/dL (ref 61–437)
IgM (Immunoglobulin M), Srm: 32 mg/dL (ref 20–172)
M PROTEIN SERPL ELPH-MCNC: 0.3 g/dL — AB
TOTAL PROTEIN ELP: 6.8 g/dL (ref 6.0–8.5)

## 2017-02-20 ENCOUNTER — Other Ambulatory Visit: Payer: Self-pay

## 2017-02-20 ENCOUNTER — Inpatient Hospital Stay (HOSPITAL_BASED_OUTPATIENT_CLINIC_OR_DEPARTMENT_OTHER): Payer: No Typology Code available for payment source | Admitting: Oncology

## 2017-02-20 ENCOUNTER — Encounter (HOSPITAL_COMMUNITY): Payer: Self-pay | Admitting: Oncology

## 2017-02-20 DIAGNOSIS — C9000 Multiple myeloma not having achieved remission: Secondary | ICD-10-CM | POA: Diagnosis not present

## 2017-02-20 NOTE — Assessment & Plan Note (Addendum)
62 y.o. male with previous diagnosis of multiple myeloma having completed the intense course of therapy including [place] rescue after high dose chemotherapy for consolidation as well as a full course of bortezomib maintenance.  Labs obtained continues to demonstrate very faint possible monoclonal spike on SPEP.  Today M spike 0.3%.   Spoke with Dr. Lebron Conners about patient and he recommends closely monitoring this patient's labs every month.  Per Dr. Ok Edwards last note from [facility] marrow transplant team, they are aware of the monoclonal spike on SPEP and asked to see him back in 1 year with close follow-up with [place].  Immunoglobulins and light chain ratio have essentially doubled since last visit with slight increase in Monoclonal spike. Both physicians are aware and recommend close observation at this time.   Provided a copy of lab work to patient and wife.   He continues to have symptoms of lightheadedness, drenching night sweats, chills, fatigue "no energy", burning and numbness in left foot and left pinky finger, easy bruising and occasional constipation.  Dr. Paulino Door started patient on Cymbalta approximately 6 months "because of these complaints" per patient but he has not noticed a difference. He would like to come off this medication if possible.  Recommended he taper off for approximately 1-1/2 weeks.  Take half a tablet instead of 1 tablet.  At last visit Dr. Lebron Conners completed a workup for his dizziness including echocardiogram and EKG which were both normal.   Addendum: Spoke to Dr. Ok Edwards on Friday 02/24/16.  Note including labs sent to him via epic.  He is aware of new lab results and he will follow-up with patient and potentially see him sooner, but at this time would recommend close follow-up with [place] as detailed in the note above.  Patient is aware.  Plan: --Observation --Dr. Lebron Conners recommend him to return monthly to trend M spike and immunoglobins. -- Will send Epic communication to Dr.  Ok Edwards about lab results.  --Patient will return in 1 month with labs only 66-month with labs and MD assessment.

## 2017-02-20 NOTE — Progress Notes (Addendum)
[facility] Follow-up Visit:  Assessment: Multiple myeloma ([place]) 62 y.o. male with previous diagnosis of multiple myeloma having completed the intense course of therapy including [place] rescue after high dose chemotherapy for consolidation as well as a full course of bortezomib maintenance.  Labs obtained continues to demonstrate very faint possible monoclonal spike on SPEP.  Today M spike 0.3%.   Spoke with Dr. Lebron Conners about patient and he recommends closely monitoring this patient's labs every month.  Per Dr. Ok Edwards last note from [facility] marrow transplant team, they are aware of the monoclonal spike on SPEP and asked to see him back in 1 year with close follow-up with [place].  Immunoglobulins and light chain ratio have essentially doubled since last visit with slight increase in Monoclonal spike. Both physicians are aware and recommend close observation at this time.   Provided a copy of lab work to patient and wife.   He continues to have symptoms of lightheadedness, drenching night sweats, chills, fatigue "no energy", burning and numbness in left foot and left pinky finger, easy bruising and occasional constipation.  Dr. Paulino Door started patient on Cymbalta approximately 6 months "because of these complaints" per patient but he has not noticed a difference. He would like to come off this medication if possible.  Recommended he taper off for approximately 1-1/2 weeks.  Take half a tablet instead of 1 tablet.  At last visit Dr. Lebron Conners completed a workup for his dizziness including echocardiogram and EKG which were both normal.   Addendum: Spoke to Dr. Ok Edwards on Friday 02/24/16.  Note including labs sent to him via epic.  He is aware of new lab results and he will follow-up with patient and potentially see him sooner, but at this time would recommend close follow-up with [place] as detailed in the note above.  Patient is aware.  Plan: --Observation --Dr. Lebron Conners recommend him to  return monthly to trend M spike and immunoglobins. -- Will send Epic communication to Dr. Ok Edwards about lab results.  --Patient will return in 1 month with labs only 85-month with labs and MD assessment.    No orders of the defined types were placed in this encounter.   Cancer Staging No matching staging information was found for the patient.  All questions were answered.  . The patient knows to call the clinic with any problems, questions or concerns.  This note was electronically signed.    History of Presenting Illness Craig [id number].o. [facility] multiple myeloma previously treated with induction followed by high-dose chemotherapy/follicle stem cell rescue consolidation and bortezomib maintenance. Patient weight remains stable from previous visit.  He does complain of chills, fatigue, night sweats (that dampen his shirt nightly), occasional constipation, positional dizziness, numbness and tingling in left foot and left finger and bruising easily.  He denies any fevers.  He denies any nausea vomiting or abdominal pain.  His appetite is 75% but reports no energy.  All of his problems are chronic for him.  Oncological/hematological History:   [place]   03/26/2013 Initial Diagnosis    [place]      03/26/2013 Bone Marrow Biopsy    80% cellularity, predominantly plasma cells 90%, IgG kappa, 5400 mg/dL, with anemia, hemoglobin 8.2 g, normal calcium, normal kidney function, normal bone survey but elevated beta-2 microglobulin level-5.5, consistent with stage III disease      04/02/2013 - 08/01/2013 Chemotherapy    Velcade/dexamethasone  10/24/2013 Bone Marrow Transplant    SCT, autologous.  Day 0= 10/24/2013.      02/22/2014 - 03/26/2014 Chemotherapy    Lenalidomide 10 mg daily initiated for one month, and tolerated we'll increase to 15 mg daily.  Toxicity resulting in discontinuation.      03/26/2014  Adverse Reaction    Diffuse, worsening rash, aching and bones, joints, muscles, weakness, lenalidomide discontinued      05/06/2014 - 01/17/2016 Chemotherapy    Velcade 1.3 mg/m every 14 days as maintenance therapy.       Medical History: Past Medical History:  Diagnosis Date  . Back pain   . HTN (hypertension)   . Hypercholesterolemia   . Multiple myeloma ([place])   . Sciatic nerve pain     Surgical History: Past Surgical History:  Procedure Laterality Date  . BONE MARROW TRANSPLANT    . CATARACT EXTRACTION W/PHACO Right 08/11/2014   Procedure: CATARACT EXTRACTION PHACO AND INTRAOCULAR LENS PLACEMENT (IOC);  Surgeon: Williams Che, MD;  Location: [facility];  Service: Ophthalmology;  Laterality: Right;  CDE 4.88  . CATARACT EXTRACTION W/PHACO Left 10/26/2014   Procedure: CATARACT EXTRACTION PHACO AND INTRAOCULAR LENS PLACEMENT LEFT EYE CDE=8.64;  Surgeon: Williams Che, MD;  Location: [facility];  Service: Ophthalmology;  Laterality: Left;  . COLONOSCOPY  09/12/2010   Procedure: COLONOSCOPY;  Surgeon: Daneil Dolin, MD;  Location: [facility];  Service: Endoscopy;  Laterality: N/A;  . inguinal hernia repair bilateral     as child   . KNEE SURGERY Right    arthroscopy  . TONSILLECTOMY      Family History: Family History  Problem Relation Age of Onset  . Colon polyps Father        passed away, [place]  . Colon polyps Sister   . Migraines Mother        deceased    Social History: Social History   Socioeconomic History  . Marital status: Married    Spouse name: Not on file  . Number of children: Not on file  . Years of education: Not on file  . Highest education level: Not on file  Social Needs  . Financial resource strain: Not on file  . Food insecurity - worry: Not on file  . Food insecurity - inability: Not on file  . Transportation needs - medical: Not on file  . Transportation needs - non-medical: Not on file  Occupational History  . Not on file  Tobacco  Use  . Smoking status: Current Every Day Smoker    Packs/day: 0.50    Years: 40.00    Pack years: 20.00    Types: Cigarettes  . Smokeless tobacco: Never Used  Substance and Sexual Activity  . Alcohol use: No  . Drug use: No  . Sexual activity: Yes    Birth control/protection: None  Other Topics Concern  . Not on file  Social History Narrative  . Not on file    Allergies: Allergies  Allergen Reactions  . Penicillins Diarrhea  . Ceclor [Cefaclor] Diarrhea and Rash  . Lenalidomide Rash    Medications:  Current Outpatient Medications  Medication Sig Dispense Refill  . acetaminophen (TYLENOL) 325 MG tablet Take 650 mg by mouth every 6 (six) hours as needed for mild pain.     Marland Kitchen acyclovir (ZOVIRAX) 200 MG capsule TAKE 2 CAPSULES BY MOUTH 2 TIMES DAILY. 360 capsule 1  . amLODipine (NORVASC) 10 MG tablet Take 10 mg by mouth daily.     Marland Kitchen  fentaNYL (DURAGESIC - DOSED MCG/HR) 75 MCG/HR Place 1 patch (75 mcg total) onto the skin every other day. 15 patch 0  . FIBER PO Take by mouth.    . oxyCODONE (OXY IR/ROXICODONE) 5 MG immediate release tablet Take 1 tablet (5 mg total) by mouth every 4 (four) hours as needed. pain 30 tablet 0  . sertraline (ZOLOFT) 50 MG tablet Take 1 tablet (50 mg total) daily by mouth. 90 tablet 1  . tamsulosin (FLOMAX) 0.4 MG CAPS capsule Take 1 capsule by mouth 2 (two) times daily.  5   No current facility-administered medications for this visit.     Review of Systems: Review of Systems  Constitutional: Positive for chills, diaphoresis and fatigue. Negative for unexpected weight change.  HENT:  Negative.   Eyes: Negative.   Respiratory: Negative.   Cardiovascular: Negative.   Gastrointestinal: Positive for constipation.  Musculoskeletal: Negative for gait problem.  Skin: Positive for itching.  Neurological: Positive for light-headedness. Negative for dizziness, gait problem and headaches.  Hematological: Negative.   Psychiatric/Behavioral: Negative.    All other systems reviewed and are negative.    PHYSICAL EXAMINATION Blood pressure (!) 142/81, pulse 72, temperature 98.2 F (36.8 C), temperature source Oral, resp. rate 16, height _0  (1.803 m), weight 211 lb (95.7 kg), SpO2 96 %.  ECOG PERFORMANCE STATUS: 1 - Symptomatic but completely ambulatory  Physical Exam  Constitutional: He is oriented to person, place, and time and well-developed, well-nourished, and in no distress.  HENT:  Head: Normocephalic and atraumatic.  Nose: Nose normal.  Mouth/Throat: Oropharynx is clear and moist.  Eyes: Conjunctivae and EOM are normal. Pupils are equal, round, and reactive to light.  Neck: Normal range of motion. Neck supple.  Cardiovascular: Normal rate, regular rhythm and normal heart sounds.  Pulmonary/Chest: Effort normal and breath sounds normal.  Abdominal: Soft. Bowel sounds are normal.  Musculoskeletal: Normal range of motion.  Neurological: He is alert and oriented to person, place, and time. He has normal reflexes. Gait normal.  Skin: Skin is warm and dry.  LLE old injury from mowing incident.  Psychiatric: Mood, memory, affect and judgment normal.  Nursing note and vitals reviewed.    LABORATORY DATA: I have personally reviewed the data as listed: No visits with results within 1 Week(s) from this visit.  Latest known visit with results is:  Appointment on 02/13/2017  Component Date Value Ref Range Status  . WBC 02/13/2017 5.6  4.0 - 10.5 K/uL Final  . RBC 02/13/2017 4.35  4.22 - 5.81 MIL/uL Final  . Hemoglobin 02/13/2017 14.3  13.0 - 17.0 g/dL Final  . HCT 02/13/2017 41.4  39.0 - 52.0 % Final  . MCV 02/13/2017 95.2  78.0 - 100.0 fL Final  . MCH 02/13/2017 32.9  26.0 - 34.0 pg Final  . MCHC 02/13/2017 34.5  30.0 - 36.0 g/dL Final  . RDW 02/13/2017 13.4  11.5 - 15.5 % Final  . Platelets 02/13/2017 156  150 - 400 K/uL Final  . Neutrophils Relative % 02/13/2017 59  % Final  . Neutro Abs 02/13/2017 3.4  1.7 - 7.7 K/uL  Final  . Lymphocytes Relative 02/13/2017 25  % Final  . Lymphs Abs 02/13/2017 1.4  0.7 - 4.0 K/uL Final  . Monocytes Relative 02/13/2017 11  % Final  . Monocytes Absolute 02/13/2017 0.6  0.1 - 1.0 K/uL Final  . Eosinophils Relative 02/13/2017 4  % Final  . Eosinophils Absolute 02/13/2017 0.2  0.0 - 0.7 K/uL Final  .  Basophils Relative 02/13/2017 1  % Final  . Basophils Absolute 02/13/2017 0.0  0.0 - 0.1 K/uL Final  . Sodium 02/13/2017 135  135 - 145 mmol/L Final  . Potassium 02/13/2017 3.4* 3.5 - 5.1 mmol/L Final  . Chloride 02/13/2017 100* 101 - 111 mmol/L Final  . CO2 02/13/2017 23  22 - 32 mmol/L Final  . Glucose, Bld 02/13/2017 142* 65 - 99 mg/dL Final  . BUN 02/13/2017 12  6 - 20 mg/dL Final  . Creatinine, Ser 02/13/2017 0.80  0.61 - 1.24 mg/dL Final  . Calcium 02/13/2017 9.0  8.9 - 10.3 mg/dL Final  . Total Protein 02/13/2017 6.9  6.5 - 8.1 g/dL Final  . Albumin 02/13/2017 3.8  3.5 - 5.0 g/dL Final  . AST 02/13/2017 25  15 - 41 U/L Final  . ALT 02/13/2017 30  17 - 63 U/L Final  . Alkaline Phosphatase 02/13/2017 63  38 - 126 U/L Final  . Total Bilirubin 02/13/2017 0.6  0.3 - 1.2 mg/dL Final  . GFR calc non Af Amer 02/13/2017 >60  >60 mL/min Final  . GFR calc Af Amer 02/13/2017 >60  >60 mL/min Final   Comment: (NOTE) The eGFR has been calculated using the CKD EPI equation. This calculation has not been validated in all clinical situations. eGFR's persistently <60 mL/min signify possible Chronic Kidney Disease.   . Anion gap 02/13/2017 12  5 - 15 Final  . LDH 02/13/2017 124  98 - 192 U/L Final  . Beta-2 Microglobulin 02/13/2017 2.0  0.6 - 2.4 mg/L Final   Comment: (NOTE) Siemens Immulite 2000 Immunochemiluminometric assay (ICMA) Values obtained with different assay methods or kits cannot be used interchangeably. Results cannot be interpreted as absolute evidence of the presence or absence of malignant disease. Performed At: [facility] [place],  [place] [id number] Rush Farmer MD YW:[id number]   . Kappa free light chain 02/13/2017 29.7* 3.3 - 19.4 mg/L Final  . Lamda free light chains 02/13/2017 12.6  5.7 - 26.3 mg/L Final  . Kappa, lamda light chain ratio 02/13/2017 2.36* 0.26 - 1.65 Final   Comment: (NOTE) Performed At: [facility] [place], [place] [id number] Rush Farmer MD KK:[id number]   . IgG (Immunoglobin G), Serum 02/13/2017 1,384  700 - 1,600 mg/dL Final  . IgA 02/13/2017 103  61 - 437 mg/dL Final  . IgM (Immunoglobulin M), Srm 02/13/2017 32  20 - 172 mg/dL Final  . Total Protein ELP 02/13/2017 6.8  6.0 - 8.5 g/dL Corrected  . Albumin SerPl Elph-Mcnc 02/13/2017 3.8  2.9 - 4.4 g/dL Corrected  . Alpha 1 02/13/2017 0.2  0.0 - 0.4 g/dL Corrected  . Alpha2 Glob SerPl Elph-Mcnc 02/13/2017 0.7  0.4 - 1.0 g/dL Corrected  . B-Globulin SerPl Elph-Mcnc 02/13/2017 0.9  0.7 - 1.3 g/dL Corrected  . Gamma Glob SerPl Elph-Mcnc 02/13/2017 1.2  0.4 - 1.8 g/dL Corrected  . M Protein SerPl Elph-Mcnc 02/13/2017 0.3* Not Observed g/dL Corrected  . Globulin, Total 02/13/2017 3.0  2.2 - 3.9 g/dL Corrected  . Albumin/Glob SerPl 02/13/2017 1.3  0.7 - 1.7 Corrected  . IFE 1 02/13/2017 Comment   Corrected   Comment: (NOTE) Immunofixation shows IgG monoclonal protein with kappa light chain specificity.   . Please Note 02/13/2017 Comment   Corrected   Comment: (NOTE) Protein electrophoresis scan will follow via computer, mail, or courier delivery. Performed At: [facility] [place], [place] [id number] Rush Farmer MD BO:[id number]  Greater than 50% was spent in counseling and coordination of care with this patient including but not limited to discussion of the relevant topics above (See A&P) including, but not limited to diagnosis and management of acute and chronic medical conditions.   Jacquelin Hawking, NP

## 2017-03-05 ENCOUNTER — Other Ambulatory Visit (HOSPITAL_COMMUNITY): Payer: Self-pay | Admitting: Emergency Medicine

## 2017-03-05 ENCOUNTER — Other Ambulatory Visit (HOSPITAL_COMMUNITY): Payer: Self-pay | Admitting: Adult Health

## 2017-03-05 ENCOUNTER — Encounter (HOSPITAL_COMMUNITY): Payer: Self-pay | Admitting: Adult Health

## 2017-03-05 DIAGNOSIS — C9000 Multiple myeloma not having achieved remission: Secondary | ICD-10-CM

## 2017-03-05 MED ORDER — FENTANYL 75 MCG/HR TD PT72
75.0000 ug | MEDICATED_PATCH | TRANSDERMAL | 0 refills | Status: DC
Start: 2017-03-05 — End: 2017-04-04

## 2017-03-05 MED ORDER — FENTANYL 75 MCG/HR TD PT72: 75.0000 ug | MEDICATED_PATCH | TRANSDERMAL | 0 refills | Status: DC

## 2017-03-05 NOTE — Progress Notes (Signed)
Pt called and needed refill on Fentanyl patches. Fentanyl sent to [facility] in [place].  Also wanted to know if there was a minimal amount of lab work that could be done because his insurance is not paying hardly anything on his lab work.  Spoke with [name] NP and we adjusted his lab work for February to see if it makes a difference.

## 2017-03-05 NOTE — Progress Notes (Signed)
Patient called cancer center requesting refill of Fentanyl.   [place] Controlled Substance Reporting System reviewed and refill is appropriate on or after 03/09/17. Medication e-scribed to his pharmacy ([facility]) using [employer]'s 2-step verification process.    NCCSRS reviewed:     [name], NP [place] [phone]

## 2017-03-23 ENCOUNTER — Inpatient Hospital Stay (HOSPITAL_COMMUNITY): Payer: No Typology Code available for payment source | Attending: Internal Medicine

## 2017-03-23 DIAGNOSIS — C9000 Multiple myeloma not having achieved remission: Secondary | ICD-10-CM | POA: Insufficient documentation

## 2017-03-23 LAB — CBC WITH DIFFERENTIAL/PLATELET
Basophils Absolute: 0 10*3/uL (ref 0.0–0.1)
Basophils Relative: 0 %
Eosinophils Absolute: 0.1 10*3/uL (ref 0.0–0.7)
Eosinophils Relative: 2 %
HCT: 41.3 % (ref 39.0–52.0)
Hemoglobin: 14.4 g/dL (ref 13.0–17.0)
LYMPHS ABS: 2.2 10*3/uL (ref 0.7–4.0)
LYMPHS PCT: 28 %
MCH: 33 pg (ref 26.0–34.0)
MCHC: 34.9 g/dL (ref 30.0–36.0)
MCV: 94.7 fL (ref 78.0–100.0)
MONO ABS: 0.7 10*3/uL (ref 0.1–1.0)
Monocytes Relative: 8 %
Neutro Abs: 4.9 10*3/uL (ref 1.7–7.7)
Neutrophils Relative %: 62 %
Platelets: 183 10*3/uL (ref 150–400)
RBC: 4.36 MIL/uL (ref 4.22–5.81)
RDW: 13.2 % (ref 11.5–15.5)
WBC: 8 10*3/uL (ref 4.0–10.5)

## 2017-03-27 LAB — MULTIPLE MYELOMA PANEL, SERUM
ALBUMIN/GLOB SERPL: 1.2 (ref 0.7–1.7)
Albumin SerPl Elph-Mcnc: 3.7 g/dL (ref 2.9–4.4)
Alpha 1: 0.2 g/dL (ref 0.0–0.4)
Alpha2 Glob SerPl Elph-Mcnc: 0.7 g/dL (ref 0.4–1.0)
B-GLOBULIN SERPL ELPH-MCNC: 1 g/dL (ref 0.7–1.3)
GAMMA GLOB SERPL ELPH-MCNC: 1.5 g/dL (ref 0.4–1.8)
Globulin, Total: 3.3 g/dL (ref 2.2–3.9)
IgA: 101 mg/dL (ref 61–437)
IgG (Immunoglobin G), Serum: 1435 mg/dL (ref 700–1600)
IgM (Immunoglobulin M), Srm: 21 mg/dL (ref 20–172)
M PROTEIN SERPL ELPH-MCNC: 0.3 g/dL — AB
TOTAL PROTEIN ELP: 7 g/dL (ref 6.0–8.5)

## 2017-03-28 ENCOUNTER — Telehealth (HOSPITAL_COMMUNITY): Payer: Self-pay | Admitting: Emergency Medicine

## 2017-03-28 ENCOUNTER — Other Ambulatory Visit (HOSPITAL_COMMUNITY): Payer: Self-pay | Admitting: *Deleted

## 2017-03-28 ENCOUNTER — Other Ambulatory Visit (HOSPITAL_COMMUNITY): Payer: Self-pay | Admitting: Adult Health

## 2017-03-28 DIAGNOSIS — C9000 Multiple myeloma not having achieved remission: Secondary | ICD-10-CM

## 2017-03-28 NOTE — Telephone Encounter (Signed)
Called to let the pt know that his myeloma labs were stable.  M-spike was 0.3, same as last month.  Kappa light chains added for additional lab work that needed to be monitored.  Pt is coming back in 2/21 at 11 am for this lab work.

## 2017-03-29 ENCOUNTER — Other Ambulatory Visit (HOSPITAL_COMMUNITY): Payer: Self-pay

## 2017-03-30 ENCOUNTER — Inpatient Hospital Stay (HOSPITAL_COMMUNITY): Payer: No Typology Code available for payment source

## 2017-03-30 DIAGNOSIS — C9000 Multiple myeloma not having achieved remission: Secondary | ICD-10-CM | POA: Diagnosis not present

## 2017-04-02 LAB — KAPPA/LAMBDA LIGHT CHAINS
Kappa free light chain: 39.1 mg/L — ABNORMAL HIGH (ref 3.3–19.4)
Kappa, lambda light chain ratio: 3.13 — ABNORMAL HIGH (ref 0.26–1.65)
LAMDA FREE LIGHT CHAINS: 12.5 mg/L (ref 5.7–26.3)

## 2017-04-04 ENCOUNTER — Telehealth (HOSPITAL_COMMUNITY): Payer: Self-pay

## 2017-04-04 ENCOUNTER — Other Ambulatory Visit (HOSPITAL_COMMUNITY): Payer: Self-pay

## 2017-04-04 ENCOUNTER — Encounter (HOSPITAL_COMMUNITY): Payer: Self-pay | Admitting: Adult Health

## 2017-04-04 ENCOUNTER — Other Ambulatory Visit (HOSPITAL_COMMUNITY): Payer: Self-pay | Admitting: Adult Health

## 2017-04-04 DIAGNOSIS — C9000 Multiple myeloma not having achieved remission: Secondary | ICD-10-CM

## 2017-04-04 MED ORDER — OXYCODONE HCL 5 MG PO TABS: 5.0000 mg | ORAL_TABLET | ORAL | 0 refills | Status: AC | PRN

## 2017-04-04 MED ORDER — FENTANYL 75 MCG/HR TD PT72: 75.0000 ug | MEDICATED_PATCH | TRANSDERMAL | 0 refills | Status: DC

## 2017-04-04 NOTE — Progress Notes (Signed)
Patient called cancer center requesting refill of Fentanyl patches & Oxycodone.   [place] Controlled Substance Reporting System reviewed and refill is appropriate on or after 04/05/17 for Fentanyl patches and on or after 04/04/17 for oxycodone. Medication e-scribed to his pharmacy ([facility]) using [employer] 2-step verification process.    NCCSRS reviewed:     Craig Craze, NP [place] [phone]

## 2017-04-04 NOTE — Telephone Encounter (Signed)
Patient called for refill on Fentanyl 75 mcg patch and oxycodone 5 mg breakthrough pain pill. He states he is using [facility] in [place].

## 2017-04-04 NOTE — Telephone Encounter (Signed)
Refills sent to [facility] as requested.   [name], NP [place] [phone]

## 2017-04-12 ENCOUNTER — Inpatient Hospital Stay (HOSPITAL_COMMUNITY): Payer: No Typology Code available for payment source | Attending: Adult Health

## 2017-04-12 DIAGNOSIS — I10 Essential (primary) hypertension: Secondary | ICD-10-CM | POA: Insufficient documentation

## 2017-04-12 DIAGNOSIS — F17210 Nicotine dependence, cigarettes, uncomplicated: Secondary | ICD-10-CM | POA: Diagnosis not present

## 2017-04-12 DIAGNOSIS — C9000 Multiple myeloma not having achieved remission: Secondary | ICD-10-CM | POA: Insufficient documentation

## 2017-04-12 LAB — CBC WITH DIFFERENTIAL/PLATELET
BASOS ABS: 0 10*3/uL (ref 0.0–0.1)
Basophils Relative: 0 %
EOS ABS: 0.2 10*3/uL (ref 0.0–0.7)
EOS PCT: 2 %
HCT: 42.1 % (ref 39.0–52.0)
HEMOGLOBIN: 14.5 g/dL (ref 13.0–17.0)
LYMPHS ABS: 1.8 10*3/uL (ref 0.7–4.0)
Lymphocytes Relative: 23 %
MCH: 32.5 pg (ref 26.0–34.0)
MCHC: 34.4 g/dL (ref 30.0–36.0)
MCV: 94.4 fL (ref 78.0–100.0)
Monocytes Absolute: 0.7 10*3/uL (ref 0.1–1.0)
Monocytes Relative: 9 %
NEUTROS PCT: 65 %
Neutro Abs: 4.9 10*3/uL (ref 1.7–7.7)
PLATELETS: 187 10*3/uL (ref 150–400)
RBC: 4.46 MIL/uL (ref 4.22–5.81)
RDW: 13.5 % (ref 11.5–15.5)
WBC: 7.5 10*3/uL (ref 4.0–10.5)

## 2017-04-12 LAB — COMPREHENSIVE METABOLIC PANEL
ALK PHOS: 59 U/L (ref 38–126)
ALT: 28 U/L (ref 17–63)
AST: 24 U/L (ref 15–41)
Albumin: 3.9 g/dL (ref 3.5–5.0)
Anion gap: 9 (ref 5–15)
BUN: 16 mg/dL (ref 6–20)
CALCIUM: 9.6 mg/dL (ref 8.9–10.3)
CHLORIDE: 101 mmol/L (ref 101–111)
CO2: 27 mmol/L (ref 22–32)
CREATININE: 0.84 mg/dL (ref 0.61–1.24)
GFR calc non Af Amer: >60 mL/min (ref >60)
Glucose, Bld: 125 mg/dL — ABNORMAL HIGH (ref 65–99)
Potassium: 4 mmol/L (ref 3.5–5.1)
SODIUM: 137 mmol/L (ref 135–145)
Total Bilirubin: 1 mg/dL (ref 0.3–1.2)
Total Protein: 7.4 g/dL (ref 6.5–8.1)

## 2017-04-12 LAB — LACTATE DEHYDROGENASE: LDH: 118 U/L (ref 98–192)

## 2017-04-13 LAB — KAPPA/LAMBDA LIGHT CHAINS
KAPPA FREE LGHT CHN: 48.3 mg/L — AB (ref 3.3–19.4)
Kappa, lambda light chain ratio: 3.8 — ABNORMAL HIGH (ref 0.26–1.65)
Lambda free light chains: 12.7 mg/L (ref 5.7–26.3)

## 2017-04-13 LAB — BETA 2 MICROGLOBULIN, SERUM: BETA 2 MICROGLOBULIN: 2.3 mg/L (ref 0.6–2.4)

## 2017-04-13 LAB — IGG, IGA, IGM
IGA: 101 mg/dL (ref 61–437)
IGG (IMMUNOGLOBIN G), SERUM: 1579 mg/dL (ref 700–1600)
IgM (Immunoglobulin M), Srm: 20 mg/dL (ref 20–172)

## 2017-04-16 LAB — MULTIPLE MYELOMA PANEL, SERUM
ALBUMIN SERPL ELPH-MCNC: 3.6 g/dL (ref 2.9–4.4)
ALPHA2 GLOB SERPL ELPH-MCNC: 0.7 g/dL (ref 0.4–1.0)
Albumin/Glob SerPl: 1.1 (ref 0.7–1.7)
Alpha 1: 0.2 g/dL (ref 0.0–0.4)
B-GLOBULIN SERPL ELPH-MCNC: 1 g/dL (ref 0.7–1.3)
Gamma Glob SerPl Elph-Mcnc: 1.4 g/dL (ref 0.4–1.8)
Globulin, Total: 3.3 g/dL (ref 2.2–3.9)
IGG (IMMUNOGLOBIN G), SERUM: 1482 mg/dL (ref 700–1600)
IgA: 99 mg/dL (ref 61–437)
IgM (Immunoglobulin M), Srm: 22 mg/dL (ref 20–172)
M PROTEIN SERPL ELPH-MCNC: 0.5 g/dL — AB
TOTAL PROTEIN ELP: 6.9 g/dL (ref 6.0–8.5)

## 2017-04-18 ENCOUNTER — Other Ambulatory Visit (HOSPITAL_COMMUNITY): Payer: Self-pay | Admitting: Adult Health

## 2017-04-18 DIAGNOSIS — C9000 Multiple myeloma not having achieved remission: Secondary | ICD-10-CM

## 2017-04-20 ENCOUNTER — Other Ambulatory Visit (HOSPITAL_COMMUNITY): Payer: Self-pay

## 2017-04-20 ENCOUNTER — Inpatient Hospital Stay (HOSPITAL_BASED_OUTPATIENT_CLINIC_OR_DEPARTMENT_OTHER): Payer: No Typology Code available for payment source | Admitting: Internal Medicine

## 2017-04-20 ENCOUNTER — Encounter (HOSPITAL_COMMUNITY): Payer: Self-pay | Admitting: Internal Medicine

## 2017-04-20 VITALS — BP 149/85 | HR 59 | Temp 98.6°F | Resp 18 | Wt 218.8 lb

## 2017-04-20 DIAGNOSIS — I10 Essential (primary) hypertension: Secondary | ICD-10-CM

## 2017-04-20 DIAGNOSIS — F17210 Nicotine dependence, cigarettes, uncomplicated: Secondary | ICD-10-CM | POA: Diagnosis not present

## 2017-04-20 DIAGNOSIS — C9000 Multiple myeloma not having achieved remission: Secondary | ICD-10-CM | POA: Diagnosis not present

## 2017-04-20 NOTE — Progress Notes (Signed)
Diagnosis Multiple myeloma, remission status unspecified ([place]) - Plan: CBC with Differential/Platelet, Comprehensive metabolic panel, Lactate dehydrogenase, Protein electrophoresis, serum, Beta 2 microglobulin, serum, Kappa/lambda light chains, IgG, IgA, IgM  Staging Cancer Staging No matching staging information was found for the patient.  Assessment and Plan: 1.  Multiple myeloma ([place]).  62 y.o. male with previous diagnosis of multiple myeloma having completed the intense course of therapy including [place] rescue after high dose chemotherapy for consolidation as well as a full course of bortezomib maintenance.  Pt has recently been seen by Dr. Lebron Conners, NP Quay Burow and Renato Battles.  He was reportedly recommended to follow-up with [facility] BMT program.  Pt reports he was not told about an appointment.  He is informed recent M spike is 0.5 g/dl.  He has HB 14.5 creatinine is 0.84 calcium is 9.6.  He had a recent echo performed in October 2018 that showed his ejection fraction was 65%.  He will be referred back to the [facility] BMT program with Dr. Ok Edwards.  He will be given a follow-up in 3-4 months with Dr. Delton Coombes.  He is advised to notify the office if he has any issues prior to his next visit.  2.  Hypertension.  Blood pressure is 149/85.  Continue to follow with his PCP.  Current status:  patient is seen today for follow-up.  He is here today to go over labs and echo.    Multiple myeloma ([place])   03/26/2013 Initial Diagnosis    Multiple myeloma ([place])      03/26/2013 Bone Marrow Biopsy    80% cellularity, predominantly plasma cells 90%, IgG kappa, 5400 mg/dL, with anemia, hemoglobin 8.2 g, normal calcium, normal kidney function, normal bone survey but elevated beta-2 microglobulin level-5.5, consistent with stage III disease      04/02/2013 - 08/01/2013 Chemotherapy    Velcade/dexamethasone      10/24/2013 Bone Marrow Transplant    SCT, autologous.  Day 0= 10/24/2013.      02/22/2014 - 03/26/2014  Chemotherapy    Lenalidomide 10 mg daily initiated for one month, and tolerated we'll increase to 15 mg daily.  Toxicity resulting in discontinuation.      03/26/2014 Adverse Reaction    Diffuse, worsening rash, aching and bones, joints, muscles, weakness, lenalidomide discontinued      05/06/2014 - 01/17/2016 Chemotherapy    Velcade 1.3 mg/m every 14 days as maintenance therapy.       Problem List Patient Active Problem List   Diagnosis Date Noted  . Hypokalemia [E87.6] 11/09/2015  . Multiple myeloma ([place]) [C90.00] 07/22/2015  . Encounter for screening colonoscopy 08/30/2010    Past Medical History Past Medical History:  Diagnosis Date  . Back pain   . HTN (hypertension)   . Hypercholesterolemia   . Multiple myeloma ([place])   . Sciatic nerve pain     Past Surgical History Past Surgical History:  Procedure Laterality Date  . BONE MARROW TRANSPLANT    . CATARACT EXTRACTION W/PHACO Right 08/11/2014   Procedure: CATARACT EXTRACTION PHACO AND INTRAOCULAR LENS PLACEMENT (IOC);  Surgeon: Williams Che, MD;  Location: [facility] ORS;  Service: Ophthalmology;  Laterality: Right;  CDE 4.88  . CATARACT EXTRACTION W/PHACO Left 10/26/2014   Procedure: CATARACT EXTRACTION PHACO AND INTRAOCULAR LENS PLACEMENT LEFT EYE CDE=8.64;  Surgeon: Williams Che, MD;  Location: [facility] ORS;  Service: Ophthalmology;  Laterality: Left;  . COLONOSCOPY  09/12/2010   Procedure: COLONOSCOPY;  Surgeon: Daneil Dolin, MD;  Location: [facility] ENDO SUITE;  Service:  Endoscopy;  Laterality: N/A;  . inguinal hernia repair bilateral     as child   . KNEE SURGERY Right    arthroscopy  . TONSILLECTOMY      Family History Family History  Problem Relation Age of Onset  . Colon polyps Father        passed away, [place]  . Colon polyps Sister   . Migraines Mother        deceased     Social History  reports that he has been smoking cigarettes.  He has a 20.00 pack-year smoking history. he has never used smokeless  tobacco. He reports that he does not drink alcohol or use drugs.  Medications  Current Outpatient Medications:  .  acetaminophen (TYLENOL) 325 MG tablet, Take 650 mg by mouth every 6 (six) hours as needed for mild pain. , Disp: , Rfl:  .  acyclovir (ZOVIRAX) 200 MG capsule, TAKE 2 CAPSULES BY MOUTH TWICE A DAY, Disp: 360 capsule, Rfl: 1 .  amLODipine (NORVASC) 10 MG tablet, Take 10 mg by mouth daily. , Disp: , Rfl:  .  fentaNYL (DURAGESIC - DOSED MCG/HR) 75 MCG/HR, Place 1 patch (75 mcg total) onto the skin every other day., Disp: 15 patch, Rfl: 0 .  FIBER PO, Take by mouth., Disp: , Rfl:  .  oxyCODONE (OXY IR/ROXICODONE) 5 MG immediate release tablet, Take 1 tablet (5 mg total) by mouth every 4 (four) hours as needed. pain, Disp: 30 tablet, Rfl: 0  Allergies Penicillins; Ceclor [cefaclor]; and Lenalidomide  Review of Systems Review of Systems - Oncology ROS as per HPI otherwise 12 point ROS is negative.   Physical Exam  Vitals Wt Readings from Last 3 Encounters:  04/20/17 218 lb 12.8 oz (99.2 kg)  02/20/17 211 lb (95.7 kg)  07/18/16 211 lb (95.7 kg)   Temp Readings from Last 3 Encounters:  04/20/17 98.6 F (37 C) (Oral)  02/20/17 98.2 F (36.8 C) (Oral)  04/17/16 98.2 F (36.8 C) (Oral)   BP Readings from Last 3 Encounters:  04/20/17 (!) 149/85  02/20/17 (!) 142/81  07/18/16 (!) 143/84   Pulse Readings from Last 3 Encounters:  04/20/17 (!) 59  02/20/17 72  07/18/16 65   Constitutional: Well-developed, well-nourished, and in no distress.   HENT: Head: Normocephalic and atraumatic.  Mouth/Throat: No oropharyngeal exudate. Mucosa moist. Eyes: Pupils are equal, round, and reactive to light. Conjunctivae are normal. No scleral icterus.  Neck: Normal range of motion. Neck supple. No JVD present.  Cardiovascular: Normal rate, regular rhythm and normal heart sounds.  Exam reveals no gallop and no friction rub.   No murmur heard. Pulmonary/Chest: Effort normal and breath  sounds normal. No respiratory distress. No wheezes.No rales.  Abdominal: Soft. Bowel sounds are normal. No distension. There is no tenderness. There is no guarding.  Musculoskeletal: No edema or tenderness.  Lymphadenopathy: No cervical, axillary or supraclavicular adenopathy.  Neurological: Alert and oriented to person, place, and time. No cranial nerve deficit.  Skin: Skin is warm and dry. No rash noted. No erythema. No pallor.  Psychiatric: Affect and judgment normal.   Labs No visits with results within 3 Day(s) from this visit.  Latest known visit with results is:  Appointment on 04/12/2017  Component Date Value Ref Range Status  . Kappa free light chain 04/12/2017 48.3* 3.3 - 19.4 mg/L Final  . Lamda free light chains 04/12/2017 12.7  5.7 - 26.3 mg/L Final  . Kappa, lamda light chain ratio 04/12/2017 3.80* 0.26 -  1.65 Final   Comment: (NOTE) Performed At: [facility] [place], [place] [id number] Rush Farmer MD [id number] Performed at [facility], [address], [place], [place] [address]   . IgG (Immunoglobin G), Serum 04/12/2017 1,579  700 - 1,600 mg/dL Final  . IgA 04/12/2017 101  61 - 437 mg/dL Final  . IgM (Immunoglobulin M), Srm 04/12/2017 20  20 - 172 mg/dL Final   Comment: (NOTE) Result confirmed on concentration. Performed At: [facility] [place], [place] [id number] Rush Farmer MD [id number] Performed at [facility], [address], [place], [place] [address]   . Beta-2 Microglobulin 04/12/2017 2.3  0.6 - 2.4 mg/L Final   Comment: (NOTE) Siemens Immulite 2000 Immunochemiluminometric assay (ICMA) Values obtained with different assay methods or kits cannot be used interchangeably. Results cannot be interpreted as absolute evidence of the presence or absence of malignant disease. Performed At: [facility] [place], [place] [id number] Rush Farmer MD [id number] Performed at [facility], [address], [place], [place] [address]   . IgG (Immunoglobin G), Serum 04/12/2017 1,482  700 - 1,600 mg/dL Final  . IgA 04/12/2017 99  61 - 437 mg/dL Final  . IgM (Immunoglobulin M), Srm 04/12/2017 22  20 - 172 mg/dL Final   Result confirmed on concentration.  . Total Protein ELP 04/12/2017 6.9  6.0 - 8.5 g/dL Corrected  . Albumin SerPl Elph-Mcnc 04/12/2017 3.6  2.9 - 4.4 g/dL Corrected  . Alpha 1 04/12/2017 0.2  0.0 - 0.4 g/dL Corrected  . Alpha2 Glob SerPl Elph-Mcnc 04/12/2017 0.7  0.4 - 1.0 g/dL Corrected  . B-Globulin SerPl Elph-Mcnc 04/12/2017 1.0  0.7 - 1.3 g/dL Corrected  . Gamma Glob SerPl Elph-Mcnc 04/12/2017 1.4  0.4 - 1.8 g/dL Corrected  . M Protein SerPl Elph-Mcnc 04/12/2017 0.5* Not Observed g/dL Corrected  . Globulin, Total 04/12/2017 3.3  2.2 - 3.9 g/dL Corrected  . Albumin/Glob SerPl 04/12/2017 1.1  0.7 - 1.7 Corrected  . IFE 1 04/12/2017 Comment   Corrected   Comment: (NOTE) Immunofixation shows IgG monoclonal protein with kappa light chain specificity. Please note that samples from patients receiving DARZALEX(R) (daratumumab) treatment can appear as an "IgG kappa" and mask a complete response. If this patient is receiving DARA, this IFE assay interference can be removed by ordering test number [id number]-"Immunofixation, Daratumumab-Specific, Serum" and submitting a new sample for testing or by calling the lab to add this test to the current sample.   . Please Note 04/12/2017 Comment   Corrected   Comment: (NOTE) Protein electrophoresis scan will follow via computer, mail, or courier delivery. Performed At: [facility] [place], [place] [id number] Rush Farmer MD [id number] Performed at [facility], [address], [place], [place] [address]   . LDH 04/12/2017 118  98 - 192 U/L Final   Performed at [facility], [address], [place], [place] [address]  . WBC 04/12/2017 7.5  4.0 - 10.5 K/uL Final  . RBC 04/12/2017 4.46  4.22  - 5.81 MIL/uL Final  . Hemoglobin 04/12/2017 14.5  13.0 - 17.0 g/dL Final  . HCT 04/12/2017 42.1  39.0 - 52.0 % Final  . MCV 04/12/2017 94.4  78.0 - 100.0 fL Final  . MCH 04/12/2017 32.5  26.0 - 34.0 pg Final  . MCHC 04/12/2017 34.4  30.0 - 36.0 g/dL Final  . RDW 04/12/2017 13.5  11.5 - 15.5 % Final  . Platelets 04/12/2017 187  150 -  400 K/uL Final  . Neutrophils Relative % 04/12/2017 65  % Final  . Neutro Abs 04/12/2017 4.9  1.7 - 7.7 K/uL Final  . Lymphocytes Relative 04/12/2017 23  % Final  . Lymphs Abs 04/12/2017 1.8  0.7 - 4.0 K/uL Final  . Monocytes Relative 04/12/2017 9  % Final  . Monocytes Absolute 04/12/2017 0.7  0.1 - 1.0 K/uL Final  . Eosinophils Relative 04/12/2017 2  % Final  . Eosinophils Absolute 04/12/2017 0.2  0.0 - 0.7 K/uL Final  . Basophils Relative 04/12/2017 0  % Final  . Basophils Absolute 04/12/2017 0.0  0.0 - 0.1 K/uL Final   Performed at [facility] [place], [address] [place], [place] [address]  . Sodium 04/12/2017 137  135 - 145 mmol/L Final  . Potassium 04/12/2017 4.0  3.5 - 5.1 mmol/L Final  . Chloride 04/12/2017 101  101 - 111 mmol/L Final  . CO2 04/12/2017 27  22 - 32 mmol/L Final  . Glucose, Bld 04/12/2017 125* 65 - 99 mg/dL Final  . BUN 04/12/2017 16  6 - 20 mg/dL Final  . Creatinine, Ser 04/12/2017 0.84  0.61 - 1.24 mg/dL Final  . Calcium 04/12/2017 9.6  8.9 - 10.3 mg/dL Final  . Total Protein 04/12/2017 7.4  6.5 - 8.1 g/dL Final  . Albumin 04/12/2017 3.9  3.5 - 5.0 g/dL Final  . AST 04/12/2017 24  15 - 41 U/L Final  . ALT 04/12/2017 28  17 - 63 U/L Final  . Alkaline Phosphatase 04/12/2017 59  38 - 126 U/L Final  . Total Bilirubin 04/12/2017 1.0  0.3 - 1.2 mg/dL Final  . GFR calc non Af Amer 04/12/2017 >60  >60 mL/min Final  . GFR calc Af Amer 04/12/2017 >60  >60 mL/min Final   Comment: (NOTE) The eGFR has been calculated using the CKD EPI equation. This calculation has not been validated in all clinical situations. eGFR's persistently <60 mL/min  signify possible Chronic Kidney Disease.   Georgiann Hahn gap 04/12/2017 9  5 - 15 Final   Performed at [facility], [address], [place], [place] [address]     Pathology Orders Placed This Encounter  Procedures  . CBC with Differential/Platelet    Standing Status:   Future    Standing Expiration Date:   04/21/2018  . Comprehensive metabolic panel    Standing Status:   Future    Standing Expiration Date:   04/21/2018  . Lactate dehydrogenase    Standing Status:   Future    Standing Expiration Date:   04/21/2018  . Protein electrophoresis, serum    Standing Status:   Future    Standing Expiration Date:   04/21/2018  . Beta 2 microglobulin, serum    Standing Status:   Future    Standing Expiration Date:   04/21/2018  . Kappa/lambda light chains    Standing Status:   Future    Standing Expiration Date:   04/21/2018  . IgG, IgA, IgM    Standing Status:   Future    Standing Expiration Date:   04/21/2018       Zoila Shutter MD

## 2017-04-20 NOTE — Patient Instructions (Signed)
[place] at [facility] Discharge Instructions   You were seen today by Dr. Zoila Shutter. She went over your recent blood work and other than the M-spike your labs were overall normal. She discussed you going back to see Dr. Ok Edwards at [facility] to discuss going back on therapy. We will see you back in 4 months unless they would like [place] to see you sooner.    Thank you for choosing [place] at [facility] to provide your oncology and hematology care.  To afford each patient quality time with our provider, please arrive at least 15 minutes before your scheduled appointment time.    If you have a lab appointment with the [facility] please come in thru the  Main Entrance and check in at the main information desk  You need to re-schedule your appointment should you arrive 10 or more minutes late.  We strive to give you quality time with our providers, and arriving late affects you and other patients whose appointments are after yours.  Also, if you no show three or more times for appointments you may be dismissed from the clinic at the providers discretion.     Again, thank you for choosing [facility].  Our hope is that these requests will decrease the amount of time that you wait before being seen by our physicians.       _____________________________________________________________  Should you have questions after your visit to [facility], please contact our office at [phone] between the hours of 8:30 a.m. and 4:30 p.m.  Voicemails left after 4:30 p.m. will not be returned until the following business day.  For prescription refill requests, have your pharmacy contact our office.       Resources For Cancer Patients and their Caregivers ? American Cancer Society: Can assist with transportation, wigs, general needs, runs Look Good Feel Better.        254-207-6614 ? Cancer Care: Provides financial assistance,  online support groups, medication/co-pay assistance.  1-800-813-HOPE (780)320-7896) ? [place] Assists [place] cancer patients and their families through emotional , educational and financial support.  580-280-4683 ? [place] DSS Where to apply for food stamps, Medicaid and utility assistance. 540-254-7785 ? [facility]: Transportation to medical appointments. (260)654-9665 ? Social Security Administration: May apply for disability if have a Stage IV cancer. 920-513-4448 (939) 359-6713 ? [employer], Disability and Transit Services: Assists with nutrition, care and transit needs. [place] Support Programs:   > Cancer Support Group  2nd Tuesday of the month 1pm-2pm, Journey Room   > Creative Journey  3rd Tuesday of the month 1130am-1pm, Journey Room

## 2017-04-23 ENCOUNTER — Encounter (HOSPITAL_COMMUNITY): Payer: Self-pay | Admitting: Lab

## 2017-04-23 NOTE — Progress Notes (Unsigned)
Referral sent to Dr Ok Edwards at [facility].  Appt 4/2 @12 .  ( office [phone]) called pt and is aware of appt date and time

## 2017-05-03 ENCOUNTER — Telehealth (HOSPITAL_COMMUNITY): Payer: Self-pay | Admitting: *Deleted

## 2017-05-03 ENCOUNTER — Encounter (HOSPITAL_COMMUNITY): Payer: Self-pay | Admitting: Adult Health

## 2017-05-03 ENCOUNTER — Other Ambulatory Visit (HOSPITAL_COMMUNITY): Payer: Self-pay | Admitting: Adult Health

## 2017-05-03 DIAGNOSIS — C9000 Multiple myeloma not having achieved remission: Secondary | ICD-10-CM

## 2017-05-03 MED ORDER — FENTANYL 75 MCG/HR TD PT72: 75.0000 ug | MEDICATED_PATCH | TRANSDERMAL | 0 refills | Status: DC

## 2017-05-03 NOTE — Progress Notes (Signed)
Patient called cancer center requesting refill of Fentanyl patches.   [place] Controlled Substance Reporting System reviewed and refill is appropriate on or after 05/04/17. Medication e-scribed to his pharmacy ([facility]) using [employer]'s 2-step verification process.    NCCSRS reviewed:     [name], NP [place] [phone]

## 2017-05-03 NOTE — Telephone Encounter (Signed)
Rx sent to [facility] per patient request.   [name], NP [place] [phone]

## 2017-05-08 DIAGNOSIS — C9000 Multiple myeloma not having achieved remission: Secondary | ICD-10-CM | POA: Diagnosis not present

## 2017-05-08 DIAGNOSIS — Z9484 Stem cells transplant status: Secondary | ICD-10-CM | POA: Diagnosis not present

## 2017-05-08 DIAGNOSIS — C9001 Multiple myeloma in remission: Secondary | ICD-10-CM | POA: Diagnosis not present

## 2017-05-15 DIAGNOSIS — I10 Essential (primary) hypertension: Secondary | ICD-10-CM | POA: Insufficient documentation

## 2017-05-15 DIAGNOSIS — Z9484 Stem cells transplant status: Secondary | ICD-10-CM | POA: Diagnosis not present

## 2017-05-15 DIAGNOSIS — C9001 Multiple myeloma in remission: Secondary | ICD-10-CM | POA: Diagnosis not present

## 2017-06-01 ENCOUNTER — Telehealth (HOSPITAL_COMMUNITY): Payer: Self-pay | Admitting: *Deleted

## 2017-06-01 ENCOUNTER — Other Ambulatory Visit (HOSPITAL_COMMUNITY): Payer: Self-pay | Admitting: Adult Health

## 2017-06-01 ENCOUNTER — Encounter (HOSPITAL_COMMUNITY): Payer: Self-pay | Admitting: Adult Health

## 2017-06-01 DIAGNOSIS — C9000 Multiple myeloma not having achieved remission: Secondary | ICD-10-CM

## 2017-06-01 MED ORDER — FENTANYL 75 MCG/HR TD PT72: 75.0000 ug | MEDICATED_PATCH | TRANSDERMAL | 0 refills | Status: DC

## 2017-06-01 NOTE — Progress Notes (Signed)
Patient called cancer center requesting refill of Fentanyl patch.    [place] Controlled Substance Reporting System reviewed and refill is appropriate on or after 06/06/17. Patient requested paper Rx, as he plans to change pharmacies. Therefore, post-dated paper prescription printed for patient to pick up from front desk at [facility] after showing photo ID per clinic policy.      NCCSRS reviewed:     [name], NP [place] [phone]

## 2017-06-01 NOTE — Telephone Encounter (Signed)
Rx printed for him to come pick up from cancer center per his wishes.   [name], NP [place] [phone]

## 2017-07-03 ENCOUNTER — Telehealth (HOSPITAL_COMMUNITY): Payer: Self-pay | Admitting: *Deleted

## 2017-07-04 ENCOUNTER — Other Ambulatory Visit (HOSPITAL_COMMUNITY): Payer: Self-pay

## 2017-07-04 DIAGNOSIS — G8929 Other chronic pain: Secondary | ICD-10-CM

## 2017-07-04 DIAGNOSIS — C9000 Multiple myeloma not having achieved remission: Secondary | ICD-10-CM

## 2017-07-04 MED ORDER — FENTANYL 75 MCG/HR TD PT72: 75.0000 ug | MEDICATED_PATCH | TRANSDERMAL | 0 refills | Status: DC

## 2017-07-04 NOTE — Telephone Encounter (Signed)
Patient called for refill on Fentanyl pain patches. Reviewed with Dr. Walden Field.She approved one last refill for the pain patches. Further refills should come from PCP or pain clinic. Tried to call patient back on home and cell numbers. Unable to reach him but did leave a message explaining that our new cancer doctors were extremely strict with prescribing pain and anxiety medications. Prescription is ready for pick up at front desk. If any further questions call cancer center.

## 2017-07-04 NOTE — Telephone Encounter (Signed)
Reviewed with Dr. Walden Field. Notified patient that this will be the last refill from [facility]. He will need to obtain future refills from his PCP or pain clinic. See refill encounter note from 07/04/17.

## 2017-07-23 ENCOUNTER — Telehealth (HOSPITAL_COMMUNITY): Payer: Self-pay

## 2017-07-23 NOTE — Telephone Encounter (Signed)
Patient called because he only has a week and a half of pain medicine left. He was referred to pain clinic but has not heard from them. After following up on referral, we found out it was denied. Notified patient. He is going to contact his PCP to see if he willing to write his pain medicine. Patient states he will call and let us know what his PCP tells him. I explained to patient since the pain clinic denied his referral, we will review all pain medication refills on a month by month basis. Patient verbalized understanding.

## 2017-07-25 DIAGNOSIS — G893 Neoplasm related pain (acute) (chronic): Secondary | ICD-10-CM | POA: Insufficient documentation

## 2017-07-25 DIAGNOSIS — S22000S Wedge compression fracture of unspecified thoracic vertebra, sequela: Secondary | ICD-10-CM | POA: Diagnosis not present

## 2017-07-25 DIAGNOSIS — C9000 Multiple myeloma not having achieved remission: Secondary | ICD-10-CM | POA: Diagnosis not present

## 2017-07-25 DIAGNOSIS — Z9481 Bone marrow transplant status: Secondary | ICD-10-CM | POA: Diagnosis not present

## 2017-07-25 DIAGNOSIS — Z9484 Stem cells transplant status: Secondary | ICD-10-CM | POA: Diagnosis not present

## 2017-07-25 DIAGNOSIS — E785 Hyperlipidemia, unspecified: Secondary | ICD-10-CM | POA: Diagnosis not present

## 2017-07-25 DIAGNOSIS — C9001 Multiple myeloma in remission: Secondary | ICD-10-CM | POA: Diagnosis not present

## 2017-07-25 DIAGNOSIS — I10 Essential (primary) hypertension: Secondary | ICD-10-CM | POA: Diagnosis not present

## 2017-07-31 DIAGNOSIS — Z9484 Stem cells transplant status: Secondary | ICD-10-CM | POA: Diagnosis not present

## 2017-07-31 DIAGNOSIS — C9001 Multiple myeloma in remission: Secondary | ICD-10-CM | POA: Diagnosis not present

## 2017-08-07 ENCOUNTER — Telehealth (HOSPITAL_COMMUNITY): Payer: Self-pay | Admitting: *Deleted

## 2017-08-07 NOTE — Telephone Encounter (Signed)
Received voicemail from patient stating that he was transferring his oncology care to [facility] in [place].  I returned call and patient states that they are going to assume all of his care moving forward.

## 2017-08-15 DIAGNOSIS — Z9484 Stem cells transplant status: Secondary | ICD-10-CM | POA: Diagnosis not present

## 2017-08-15 DIAGNOSIS — Z1389 Encounter for screening for other disorder: Secondary | ICD-10-CM | POA: Diagnosis not present

## 2017-08-15 DIAGNOSIS — C9000 Multiple myeloma not having achieved remission: Secondary | ICD-10-CM | POA: Diagnosis not present

## 2017-08-15 DIAGNOSIS — J22 Unspecified acute lower respiratory infection: Secondary | ICD-10-CM | POA: Diagnosis not present

## 2017-08-15 DIAGNOSIS — Z6827 Body mass index (BMI) 27.0-27.9, adult: Secondary | ICD-10-CM | POA: Diagnosis not present

## 2017-08-15 DIAGNOSIS — H6993 Unspecified Eustachian tube disorder, bilateral: Secondary | ICD-10-CM | POA: Diagnosis not present

## 2017-08-20 ENCOUNTER — Other Ambulatory Visit (HOSPITAL_COMMUNITY): Payer: Self-pay | Admitting: Oncology

## 2017-08-20 DIAGNOSIS — C9000 Multiple myeloma not having achieved remission: Secondary | ICD-10-CM

## 2017-08-21 ENCOUNTER — Other Ambulatory Visit (HOSPITAL_COMMUNITY): Payer: Self-pay

## 2017-08-21 ENCOUNTER — Ambulatory Visit (HOSPITAL_COMMUNITY): Payer: Self-pay | Admitting: Hematology

## 2017-08-27 ENCOUNTER — Ambulatory Visit (HOSPITAL_COMMUNITY): Payer: PPO

## 2017-08-27 ENCOUNTER — Encounter (HOSPITAL_COMMUNITY): Payer: Self-pay

## 2017-08-27 DIAGNOSIS — C9000 Multiple myeloma not having achieved remission: Secondary | ICD-10-CM | POA: Diagnosis not present

## 2017-08-27 DIAGNOSIS — Z9484 Stem cells transplant status: Secondary | ICD-10-CM | POA: Diagnosis not present

## 2017-08-31 ENCOUNTER — Ambulatory Visit (HOSPITAL_COMMUNITY)
Admission: RE | Admit: 2017-08-31 | Discharge: 2017-08-31 | Disposition: A | Discharge status: Home / Self Care | Payer: PPO | Source: Ambulatory Visit | Attending: Oncology | Admitting: Oncology

## 2017-08-31 DIAGNOSIS — C9000 Multiple myeloma not having achieved remission: Secondary | ICD-10-CM | POA: Insufficient documentation

## 2017-08-31 DIAGNOSIS — Z79899 Other long term (current) drug therapy: Secondary | ICD-10-CM | POA: Diagnosis not present

## 2017-08-31 DIAGNOSIS — J432 Centrilobular emphysema: Secondary | ICD-10-CM | POA: Insufficient documentation

## 2017-08-31 DIAGNOSIS — I700 Atherosclerosis of aorta: Secondary | ICD-10-CM | POA: Diagnosis not present

## 2017-08-31 DIAGNOSIS — K8020 Calculus of gallbladder without cholecystitis without obstruction: Secondary | ICD-10-CM | POA: Diagnosis not present

## 2017-08-31 DIAGNOSIS — E041 Nontoxic single thyroid nodule: Secondary | ICD-10-CM | POA: Diagnosis not present

## 2017-08-31 LAB — GLUCOSE, CAPILLARY: GLUCOSE-CAPILLARY: 100 mg/dL — AB (ref 70–99)

## 2017-08-31 MED ORDER — FLUDEOXYGLUCOSE F - 18 (FDG) INJECTION
10.3200 | Freq: Once | INTRAVENOUS | Status: AC | PRN
  Administered 2017-08-31: 10.32 via INTRAVENOUS

## 2017-09-04 DIAGNOSIS — C9000 Multiple myeloma not having achieved remission: Secondary | ICD-10-CM | POA: Diagnosis not present

## 2017-09-04 DIAGNOSIS — D171 Benign lipomatous neoplasm of skin and subcutaneous tissue of trunk: Secondary | ICD-10-CM | POA: Diagnosis not present

## 2017-09-04 DIAGNOSIS — Z5189 Encounter for other specified aftercare: Secondary | ICD-10-CM | POA: Diagnosis not present

## 2017-09-04 DIAGNOSIS — Z1211 Encounter for screening for malignant neoplasm of colon: Secondary | ICD-10-CM | POA: Diagnosis not present

## 2017-09-06 DIAGNOSIS — Z5189 Encounter for other specified aftercare: Secondary | ICD-10-CM | POA: Diagnosis not present

## 2017-09-06 DIAGNOSIS — C9000 Multiple myeloma not having achieved remission: Secondary | ICD-10-CM | POA: Diagnosis not present

## 2017-09-06 DIAGNOSIS — Z1211 Encounter for screening for malignant neoplasm of colon: Secondary | ICD-10-CM | POA: Diagnosis not present

## 2017-09-06 DIAGNOSIS — D171 Benign lipomatous neoplasm of skin and subcutaneous tissue of trunk: Secondary | ICD-10-CM | POA: Diagnosis not present

## 2017-09-11 DIAGNOSIS — Z9484 Stem cells transplant status: Secondary | ICD-10-CM | POA: Diagnosis not present

## 2017-09-11 DIAGNOSIS — Z1211 Encounter for screening for malignant neoplasm of colon: Secondary | ICD-10-CM | POA: Diagnosis not present

## 2017-09-11 DIAGNOSIS — D171 Benign lipomatous neoplasm of skin and subcutaneous tissue of trunk: Secondary | ICD-10-CM | POA: Diagnosis not present

## 2017-09-11 DIAGNOSIS — C9000 Multiple myeloma not having achieved remission: Secondary | ICD-10-CM | POA: Diagnosis not present

## 2017-09-18 DIAGNOSIS — Z9484 Stem cells transplant status: Secondary | ICD-10-CM | POA: Diagnosis not present

## 2017-09-18 DIAGNOSIS — C9000 Multiple myeloma not having achieved remission: Secondary | ICD-10-CM | POA: Diagnosis not present

## 2017-09-18 DIAGNOSIS — Z5111 Encounter for antineoplastic chemotherapy: Secondary | ICD-10-CM | POA: Diagnosis not present

## 2017-09-21 DIAGNOSIS — Z9484 Stem cells transplant status: Secondary | ICD-10-CM | POA: Diagnosis not present

## 2017-09-21 DIAGNOSIS — Z5111 Encounter for antineoplastic chemotherapy: Secondary | ICD-10-CM | POA: Diagnosis not present

## 2017-09-21 DIAGNOSIS — C9000 Multiple myeloma not having achieved remission: Secondary | ICD-10-CM | POA: Diagnosis not present

## 2017-09-25 DIAGNOSIS — S22000S Wedge compression fracture of unspecified thoracic vertebra, sequela: Secondary | ICD-10-CM | POA: Diagnosis not present

## 2017-09-25 DIAGNOSIS — G893 Neoplasm related pain (acute) (chronic): Secondary | ICD-10-CM | POA: Diagnosis not present

## 2017-09-25 DIAGNOSIS — C9000 Multiple myeloma not having achieved remission: Secondary | ICD-10-CM | POA: Diagnosis not present

## 2017-09-25 DIAGNOSIS — Z09 Encounter for follow-up examination after completed treatment for conditions other than malignant neoplasm: Secondary | ICD-10-CM | POA: Diagnosis not present

## 2017-09-25 DIAGNOSIS — Z9484 Stem cells transplant status: Secondary | ICD-10-CM | POA: Diagnosis not present

## 2017-09-28 DIAGNOSIS — C9000 Multiple myeloma not having achieved remission: Secondary | ICD-10-CM | POA: Diagnosis not present

## 2017-09-28 DIAGNOSIS — Z5112 Encounter for antineoplastic immunotherapy: Secondary | ICD-10-CM | POA: Diagnosis not present

## 2017-09-28 DIAGNOSIS — Z79899 Other long term (current) drug therapy: Secondary | ICD-10-CM | POA: Diagnosis not present

## 2017-09-28 DIAGNOSIS — Z9884 Bariatric surgery status: Secondary | ICD-10-CM | POA: Diagnosis not present

## 2017-10-05 DIAGNOSIS — Z9484 Stem cells transplant status: Secondary | ICD-10-CM | POA: Diagnosis not present

## 2017-10-05 DIAGNOSIS — C9000 Multiple myeloma not having achieved remission: Secondary | ICD-10-CM | POA: Diagnosis not present

## 2017-10-09 DIAGNOSIS — C9000 Multiple myeloma not having achieved remission: Secondary | ICD-10-CM | POA: Diagnosis not present

## 2017-10-09 DIAGNOSIS — Z5112 Encounter for antineoplastic immunotherapy: Secondary | ICD-10-CM | POA: Diagnosis not present

## 2017-10-09 DIAGNOSIS — Z9484 Stem cells transplant status: Secondary | ICD-10-CM | POA: Diagnosis not present

## 2017-10-10 DIAGNOSIS — G893 Neoplasm related pain (acute) (chronic): Secondary | ICD-10-CM | POA: Diagnosis not present

## 2017-10-10 DIAGNOSIS — Z09 Encounter for follow-up examination after completed treatment for conditions other than malignant neoplasm: Secondary | ICD-10-CM | POA: Diagnosis not present

## 2017-10-10 DIAGNOSIS — Z9484 Stem cells transplant status: Secondary | ICD-10-CM | POA: Diagnosis not present

## 2017-10-10 DIAGNOSIS — C9002 Multiple myeloma in relapse: Secondary | ICD-10-CM | POA: Diagnosis not present

## 2017-10-12 DIAGNOSIS — Z5112 Encounter for antineoplastic immunotherapy: Secondary | ICD-10-CM | POA: Diagnosis not present

## 2017-10-12 DIAGNOSIS — C9000 Multiple myeloma not having achieved remission: Secondary | ICD-10-CM | POA: Diagnosis not present

## 2017-10-12 DIAGNOSIS — Z9484 Stem cells transplant status: Secondary | ICD-10-CM | POA: Diagnosis not present

## 2017-10-16 DIAGNOSIS — Z9481 Bone marrow transplant status: Secondary | ICD-10-CM | POA: Diagnosis not present

## 2017-10-16 DIAGNOSIS — F17210 Nicotine dependence, cigarettes, uncomplicated: Secondary | ICD-10-CM | POA: Diagnosis not present

## 2017-10-16 DIAGNOSIS — Z7682 Awaiting organ transplant status: Secondary | ICD-10-CM | POA: Diagnosis not present

## 2017-10-16 DIAGNOSIS — C9000 Multiple myeloma not having achieved remission: Secondary | ICD-10-CM | POA: Diagnosis not present

## 2017-10-16 DIAGNOSIS — Z79899 Other long term (current) drug therapy: Secondary | ICD-10-CM | POA: Diagnosis not present

## 2017-10-16 DIAGNOSIS — Z5111 Encounter for antineoplastic chemotherapy: Secondary | ICD-10-CM | POA: Diagnosis not present

## 2017-10-16 DIAGNOSIS — Z9484 Stem cells transplant status: Secondary | ICD-10-CM | POA: Diagnosis not present

## 2017-10-16 DIAGNOSIS — S22000S Wedge compression fracture of unspecified thoracic vertebra, sequela: Secondary | ICD-10-CM | POA: Diagnosis not present

## 2017-10-16 DIAGNOSIS — C9002 Multiple myeloma in relapse: Secondary | ICD-10-CM | POA: Diagnosis not present

## 2017-10-16 DIAGNOSIS — I10 Essential (primary) hypertension: Secondary | ICD-10-CM | POA: Diagnosis not present

## 2017-10-19 DIAGNOSIS — Z5112 Encounter for antineoplastic immunotherapy: Secondary | ICD-10-CM | POA: Diagnosis not present

## 2017-10-19 DIAGNOSIS — C9000 Multiple myeloma not having achieved remission: Secondary | ICD-10-CM | POA: Diagnosis not present

## 2017-10-26 DIAGNOSIS — C9002 Multiple myeloma in relapse: Secondary | ICD-10-CM | POA: Diagnosis not present

## 2017-10-30 DIAGNOSIS — C9000 Multiple myeloma not having achieved remission: Secondary | ICD-10-CM | POA: Diagnosis not present

## 2017-10-30 DIAGNOSIS — Z5112 Encounter for antineoplastic immunotherapy: Secondary | ICD-10-CM | POA: Diagnosis not present

## 2017-10-30 DIAGNOSIS — Z9484 Stem cells transplant status: Secondary | ICD-10-CM | POA: Diagnosis not present

## 2017-11-02 DIAGNOSIS — Z9484 Stem cells transplant status: Secondary | ICD-10-CM | POA: Diagnosis not present

## 2017-11-02 DIAGNOSIS — C9000 Multiple myeloma not having achieved remission: Secondary | ICD-10-CM | POA: Diagnosis not present

## 2017-11-02 DIAGNOSIS — Z5111 Encounter for antineoplastic chemotherapy: Secondary | ICD-10-CM | POA: Diagnosis not present

## 2017-11-06 DIAGNOSIS — M79672 Pain in left foot: Secondary | ICD-10-CM | POA: Diagnosis not present

## 2017-11-06 DIAGNOSIS — C9000 Multiple myeloma not having achieved remission: Secondary | ICD-10-CM | POA: Diagnosis not present

## 2017-11-06 DIAGNOSIS — Z9484 Stem cells transplant status: Secondary | ICD-10-CM | POA: Diagnosis not present

## 2017-11-06 DIAGNOSIS — Z5112 Encounter for antineoplastic immunotherapy: Secondary | ICD-10-CM | POA: Diagnosis not present

## 2017-11-09 DIAGNOSIS — C9000 Multiple myeloma not having achieved remission: Secondary | ICD-10-CM | POA: Diagnosis not present

## 2017-11-09 DIAGNOSIS — Z5112 Encounter for antineoplastic immunotherapy: Secondary | ICD-10-CM | POA: Diagnosis not present

## 2017-11-09 DIAGNOSIS — Z9484 Stem cells transplant status: Secondary | ICD-10-CM | POA: Diagnosis not present

## 2017-11-15 DIAGNOSIS — Z9484 Stem cells transplant status: Secondary | ICD-10-CM | POA: Diagnosis not present

## 2017-11-15 DIAGNOSIS — C9000 Multiple myeloma not having achieved remission: Secondary | ICD-10-CM | POA: Diagnosis not present

## 2017-11-26 DIAGNOSIS — C9000 Multiple myeloma not having achieved remission: Secondary | ICD-10-CM | POA: Diagnosis not present

## 2017-11-26 DIAGNOSIS — R791 Abnormal coagulation profile: Secondary | ICD-10-CM | POA: Diagnosis not present

## 2017-11-26 DIAGNOSIS — Z79899 Other long term (current) drug therapy: Secondary | ICD-10-CM | POA: Diagnosis not present

## 2017-11-26 DIAGNOSIS — I10 Essential (primary) hypertension: Secondary | ICD-10-CM | POA: Diagnosis not present

## 2017-11-26 DIAGNOSIS — M7918 Myalgia, other site: Secondary | ICD-10-CM | POA: Diagnosis not present

## 2017-11-26 DIAGNOSIS — Z79891 Long term (current) use of opiate analgesic: Secondary | ICD-10-CM | POA: Diagnosis not present

## 2017-11-26 DIAGNOSIS — M79661 Pain in right lower leg: Secondary | ICD-10-CM | POA: Diagnosis not present

## 2017-11-26 DIAGNOSIS — F17200 Nicotine dependence, unspecified, uncomplicated: Secondary | ICD-10-CM | POA: Diagnosis not present

## 2017-11-27 DIAGNOSIS — I82811 Embolism and thrombosis of superficial veins of right lower extremities: Secondary | ICD-10-CM | POA: Diagnosis not present

## 2017-11-27 DIAGNOSIS — C9001 Multiple myeloma in remission: Secondary | ICD-10-CM | POA: Diagnosis not present

## 2017-11-27 DIAGNOSIS — I82401 Acute embolism and thrombosis of unspecified deep veins of right lower extremity: Secondary | ICD-10-CM | POA: Diagnosis not present

## 2017-11-27 DIAGNOSIS — Z6827 Body mass index (BMI) 27.0-27.9, adult: Secondary | ICD-10-CM | POA: Diagnosis not present

## 2017-11-27 DIAGNOSIS — I10 Essential (primary) hypertension: Secondary | ICD-10-CM | POA: Diagnosis not present

## 2017-11-27 DIAGNOSIS — R7989 Other specified abnormal findings of blood chemistry: Secondary | ICD-10-CM | POA: Diagnosis not present

## 2017-11-27 DIAGNOSIS — I82411 Acute embolism and thrombosis of right femoral vein: Secondary | ICD-10-CM | POA: Diagnosis not present

## 2017-11-27 DIAGNOSIS — I82431 Acute embolism and thrombosis of right popliteal vein: Secondary | ICD-10-CM | POA: Diagnosis not present

## 2017-11-28 DIAGNOSIS — I10 Essential (primary) hypertension: Secondary | ICD-10-CM | POA: Diagnosis not present

## 2017-11-28 DIAGNOSIS — E782 Mixed hyperlipidemia: Secondary | ICD-10-CM | POA: Diagnosis not present

## 2017-11-28 DIAGNOSIS — Z6826 Body mass index (BMI) 26.0-26.9, adult: Secondary | ICD-10-CM | POA: Diagnosis not present

## 2017-11-28 DIAGNOSIS — Z1389 Encounter for screening for other disorder: Secondary | ICD-10-CM | POA: Diagnosis not present

## 2017-11-28 DIAGNOSIS — I82401 Acute embolism and thrombosis of unspecified deep veins of right lower extremity: Secondary | ICD-10-CM | POA: Diagnosis not present

## 2017-11-28 DIAGNOSIS — E663 Overweight: Secondary | ICD-10-CM | POA: Diagnosis not present

## 2017-11-28 DIAGNOSIS — G894 Chronic pain syndrome: Secondary | ICD-10-CM | POA: Diagnosis not present

## 2017-11-28 DIAGNOSIS — Z0001 Encounter for general adult medical examination with abnormal findings: Secondary | ICD-10-CM | POA: Diagnosis not present

## 2017-11-28 DIAGNOSIS — E669 Obesity, unspecified: Secondary | ICD-10-CM | POA: Diagnosis not present

## 2017-11-28 DIAGNOSIS — C9000 Multiple myeloma not having achieved remission: Secondary | ICD-10-CM | POA: Diagnosis not present

## 2017-11-29 DIAGNOSIS — Z7951 Long term (current) use of inhaled steroids: Secondary | ICD-10-CM | POA: Diagnosis not present

## 2017-11-29 DIAGNOSIS — E785 Hyperlipidemia, unspecified: Secondary | ICD-10-CM | POA: Diagnosis not present

## 2017-11-29 DIAGNOSIS — J329 Chronic sinusitis, unspecified: Secondary | ICD-10-CM | POA: Diagnosis not present

## 2017-11-29 DIAGNOSIS — I10 Essential (primary) hypertension: Secondary | ICD-10-CM | POA: Diagnosis not present

## 2017-11-29 DIAGNOSIS — Z79899 Other long term (current) drug therapy: Secondary | ICD-10-CM | POA: Diagnosis not present

## 2017-11-29 DIAGNOSIS — Z09 Encounter for follow-up examination after completed treatment for conditions other than malignant neoplasm: Secondary | ICD-10-CM | POA: Diagnosis not present

## 2017-11-29 DIAGNOSIS — C9000 Multiple myeloma not having achieved remission: Secondary | ICD-10-CM | POA: Diagnosis not present

## 2017-11-29 DIAGNOSIS — Z9484 Stem cells transplant status: Secondary | ICD-10-CM | POA: Diagnosis not present

## 2017-11-29 DIAGNOSIS — I82411 Acute embolism and thrombosis of right femoral vein: Secondary | ICD-10-CM | POA: Diagnosis not present

## 2017-11-29 DIAGNOSIS — D649 Anemia, unspecified: Secondary | ICD-10-CM | POA: Diagnosis not present

## 2017-11-29 DIAGNOSIS — N289 Disorder of kidney and ureter, unspecified: Secondary | ICD-10-CM | POA: Diagnosis not present

## 2017-11-29 DIAGNOSIS — Z48298 Encounter for aftercare following other organ transplant: Secondary | ICD-10-CM | POA: Diagnosis not present

## 2017-11-29 DIAGNOSIS — R197 Diarrhea, unspecified: Secondary | ICD-10-CM | POA: Diagnosis not present

## 2017-11-29 DIAGNOSIS — J302 Other seasonal allergic rhinitis: Secondary | ICD-10-CM | POA: Diagnosis not present

## 2017-11-29 DIAGNOSIS — Z880 Allergy status to penicillin: Secondary | ICD-10-CM | POA: Diagnosis not present

## 2017-11-29 DIAGNOSIS — G629 Polyneuropathy, unspecified: Secondary | ICD-10-CM | POA: Diagnosis not present

## 2017-11-29 DIAGNOSIS — K0889 Other specified disorders of teeth and supporting structures: Secondary | ICD-10-CM | POA: Diagnosis not present

## 2017-11-29 DIAGNOSIS — G893 Neoplasm related pain (acute) (chronic): Secondary | ICD-10-CM | POA: Diagnosis not present

## 2017-12-04 DIAGNOSIS — C9000 Multiple myeloma not having achieved remission: Secondary | ICD-10-CM | POA: Diagnosis not present

## 2017-12-04 DIAGNOSIS — I700 Atherosclerosis of aorta: Secondary | ICD-10-CM | POA: Diagnosis not present

## 2017-12-04 DIAGNOSIS — J320 Chronic maxillary sinusitis: Secondary | ICD-10-CM | POA: Diagnosis not present

## 2017-12-04 DIAGNOSIS — R918 Other nonspecific abnormal finding of lung field: Secondary | ICD-10-CM | POA: Diagnosis not present

## 2017-12-04 DIAGNOSIS — J3489 Other specified disorders of nose and nasal sinuses: Secondary | ICD-10-CM | POA: Diagnosis not present

## 2017-12-04 DIAGNOSIS — J439 Emphysema, unspecified: Secondary | ICD-10-CM | POA: Diagnosis not present

## 2017-12-04 DIAGNOSIS — E041 Nontoxic single thyroid nodule: Secondary | ICD-10-CM | POA: Diagnosis not present

## 2017-12-04 DIAGNOSIS — I2699 Other pulmonary embolism without acute cor pulmonale: Secondary | ICD-10-CM | POA: Diagnosis not present

## 2017-12-04 DIAGNOSIS — M438X4 Other specified deforming dorsopathies, thoracic region: Secondary | ICD-10-CM | POA: Diagnosis not present

## 2017-12-07 DIAGNOSIS — J329 Chronic sinusitis, unspecified: Secondary | ICD-10-CM | POA: Diagnosis not present

## 2017-12-17 DIAGNOSIS — J329 Chronic sinusitis, unspecified: Secondary | ICD-10-CM | POA: Diagnosis not present

## 2017-12-25 DIAGNOSIS — C9001 Multiple myeloma in remission: Secondary | ICD-10-CM | POA: Diagnosis not present

## 2017-12-28 DIAGNOSIS — C9002 Multiple myeloma in relapse: Secondary | ICD-10-CM | POA: Diagnosis not present

## 2017-12-28 DIAGNOSIS — G909 Disorder of the autonomic nervous system, unspecified: Secondary | ICD-10-CM | POA: Diagnosis not present

## 2017-12-28 DIAGNOSIS — Z09 Encounter for follow-up examination after completed treatment for conditions other than malignant neoplasm: Secondary | ICD-10-CM | POA: Diagnosis not present

## 2017-12-28 DIAGNOSIS — G893 Neoplasm related pain (acute) (chronic): Secondary | ICD-10-CM | POA: Diagnosis not present

## 2017-12-28 DIAGNOSIS — Z9484 Stem cells transplant status: Secondary | ICD-10-CM | POA: Diagnosis not present

## 2018-01-09 DIAGNOSIS — E663 Overweight: Secondary | ICD-10-CM | POA: Diagnosis not present

## 2018-01-09 DIAGNOSIS — I82409 Acute embolism and thrombosis of unspecified deep veins of unspecified lower extremity: Secondary | ICD-10-CM | POA: Diagnosis not present

## 2018-01-09 DIAGNOSIS — C9000 Multiple myeloma not having achieved remission: Secondary | ICD-10-CM | POA: Diagnosis not present

## 2018-01-09 DIAGNOSIS — Z6827 Body mass index (BMI) 27.0-27.9, adult: Secondary | ICD-10-CM | POA: Diagnosis not present

## 2018-01-09 DIAGNOSIS — I10 Essential (primary) hypertension: Secondary | ICD-10-CM | POA: Diagnosis not present

## 2018-02-01 DIAGNOSIS — C9002 Multiple myeloma in relapse: Secondary | ICD-10-CM | POA: Diagnosis not present

## 2018-02-08 DIAGNOSIS — J329 Chronic sinusitis, unspecified: Secondary | ICD-10-CM | POA: Diagnosis not present

## 2018-02-08 DIAGNOSIS — G893 Neoplasm related pain (acute) (chronic): Secondary | ICD-10-CM | POA: Diagnosis not present

## 2018-02-08 DIAGNOSIS — G620 Drug-induced polyneuropathy: Secondary | ICD-10-CM | POA: Diagnosis not present

## 2018-02-08 DIAGNOSIS — C9000 Multiple myeloma not having achieved remission: Secondary | ICD-10-CM | POA: Diagnosis not present

## 2018-02-08 DIAGNOSIS — T451X5A Adverse effect of antineoplastic and immunosuppressive drugs, initial encounter: Secondary | ICD-10-CM | POA: Diagnosis not present

## 2018-02-08 DIAGNOSIS — Z9484 Stem cells transplant status: Secondary | ICD-10-CM | POA: Diagnosis not present

## 2018-02-11 DIAGNOSIS — G620 Drug-induced polyneuropathy: Secondary | ICD-10-CM | POA: Insufficient documentation

## 2018-03-04 DIAGNOSIS — C9000 Multiple myeloma not having achieved remission: Secondary | ICD-10-CM | POA: Diagnosis not present

## 2018-03-11 DIAGNOSIS — D801 Nonfamilial hypogammaglobulinemia: Secondary | ICD-10-CM | POA: Diagnosis not present

## 2018-03-11 DIAGNOSIS — Z9481 Bone marrow transplant status: Secondary | ICD-10-CM | POA: Diagnosis not present

## 2018-03-11 DIAGNOSIS — G620 Drug-induced polyneuropathy: Secondary | ICD-10-CM | POA: Diagnosis not present

## 2018-03-11 DIAGNOSIS — N289 Disorder of kidney and ureter, unspecified: Secondary | ICD-10-CM | POA: Diagnosis not present

## 2018-03-11 DIAGNOSIS — Z7901 Long term (current) use of anticoagulants: Secondary | ICD-10-CM | POA: Diagnosis not present

## 2018-03-11 DIAGNOSIS — Z79899 Other long term (current) drug therapy: Secondary | ICD-10-CM | POA: Diagnosis not present

## 2018-03-11 DIAGNOSIS — C9002 Multiple myeloma in relapse: Secondary | ICD-10-CM | POA: Diagnosis not present

## 2018-03-11 DIAGNOSIS — Z87891 Personal history of nicotine dependence: Secondary | ICD-10-CM | POA: Diagnosis not present

## 2018-03-11 DIAGNOSIS — T451X5A Adverse effect of antineoplastic and immunosuppressive drugs, initial encounter: Secondary | ICD-10-CM | POA: Diagnosis not present

## 2018-03-11 DIAGNOSIS — D649 Anemia, unspecified: Secondary | ICD-10-CM | POA: Diagnosis not present

## 2018-03-11 DIAGNOSIS — Z9484 Stem cells transplant status: Secondary | ICD-10-CM | POA: Diagnosis not present

## 2018-03-11 DIAGNOSIS — J302 Other seasonal allergic rhinitis: Secondary | ICD-10-CM | POA: Diagnosis not present

## 2018-03-11 DIAGNOSIS — E785 Hyperlipidemia, unspecified: Secondary | ICD-10-CM | POA: Diagnosis not present

## 2018-03-11 DIAGNOSIS — J329 Chronic sinusitis, unspecified: Secondary | ICD-10-CM | POA: Diagnosis not present

## 2018-03-11 DIAGNOSIS — Z23 Encounter for immunization: Secondary | ICD-10-CM | POA: Diagnosis not present

## 2018-03-11 DIAGNOSIS — R0600 Dyspnea, unspecified: Secondary | ICD-10-CM | POA: Diagnosis not present

## 2018-03-11 DIAGNOSIS — G893 Neoplasm related pain (acute) (chronic): Secondary | ICD-10-CM | POA: Diagnosis not present

## 2018-03-11 DIAGNOSIS — I10 Essential (primary) hypertension: Secondary | ICD-10-CM | POA: Diagnosis not present

## 2018-03-11 DIAGNOSIS — G629 Polyneuropathy, unspecified: Secondary | ICD-10-CM | POA: Diagnosis not present

## 2018-04-23 DIAGNOSIS — G894 Chronic pain syndrome: Secondary | ICD-10-CM | POA: Diagnosis not present

## 2018-04-23 DIAGNOSIS — I10 Essential (primary) hypertension: Secondary | ICD-10-CM | POA: Diagnosis not present

## 2018-04-23 DIAGNOSIS — C9000 Multiple myeloma not having achieved remission: Secondary | ICD-10-CM | POA: Diagnosis not present

## 2018-04-23 DIAGNOSIS — Z9481 Bone marrow transplant status: Secondary | ICD-10-CM | POA: Diagnosis not present

## 2018-04-23 DIAGNOSIS — Z6829 Body mass index (BMI) 29.0-29.9, adult: Secondary | ICD-10-CM | POA: Diagnosis not present

## 2018-05-03 DIAGNOSIS — Z7682 Awaiting organ transplant status: Secondary | ICD-10-CM | POA: Diagnosis not present

## 2018-05-03 DIAGNOSIS — J329 Chronic sinusitis, unspecified: Secondary | ICD-10-CM | POA: Diagnosis not present

## 2018-05-03 DIAGNOSIS — C9000 Multiple myeloma not having achieved remission: Secondary | ICD-10-CM | POA: Diagnosis not present

## 2018-05-10 DIAGNOSIS — G893 Neoplasm related pain (acute) (chronic): Secondary | ICD-10-CM | POA: Diagnosis not present

## 2018-05-10 DIAGNOSIS — Z9484 Stem cells transplant status: Secondary | ICD-10-CM | POA: Diagnosis not present

## 2018-05-10 DIAGNOSIS — I10 Essential (primary) hypertension: Secondary | ICD-10-CM | POA: Diagnosis not present

## 2018-05-10 DIAGNOSIS — C9002 Multiple myeloma in relapse: Secondary | ICD-10-CM | POA: Diagnosis not present

## 2018-05-10 DIAGNOSIS — E785 Hyperlipidemia, unspecified: Secondary | ICD-10-CM | POA: Diagnosis not present

## 2018-06-18 DIAGNOSIS — Z0000 Encounter for general adult medical examination without abnormal findings: Secondary | ICD-10-CM | POA: Diagnosis not present

## 2018-06-19 DIAGNOSIS — C9002 Multiple myeloma in relapse: Secondary | ICD-10-CM | POA: Diagnosis not present

## 2018-06-25 DIAGNOSIS — C9000 Multiple myeloma not having achieved remission: Secondary | ICD-10-CM | POA: Diagnosis not present

## 2018-06-25 DIAGNOSIS — Z86718 Personal history of other venous thrombosis and embolism: Secondary | ICD-10-CM | POA: Diagnosis not present

## 2018-06-25 DIAGNOSIS — Z9484 Stem cells transplant status: Secondary | ICD-10-CM | POA: Diagnosis not present

## 2018-06-25 DIAGNOSIS — I10 Essential (primary) hypertension: Secondary | ICD-10-CM | POA: Diagnosis not present

## 2018-06-25 DIAGNOSIS — G893 Neoplasm related pain (acute) (chronic): Secondary | ICD-10-CM | POA: Diagnosis not present

## 2018-06-25 DIAGNOSIS — Z7901 Long term (current) use of anticoagulants: Secondary | ICD-10-CM | POA: Diagnosis not present

## 2018-06-25 DIAGNOSIS — E785 Hyperlipidemia, unspecified: Secondary | ICD-10-CM | POA: Diagnosis not present

## 2018-06-25 DIAGNOSIS — D801 Nonfamilial hypogammaglobulinemia: Secondary | ICD-10-CM | POA: Diagnosis not present

## 2018-06-25 DIAGNOSIS — R42 Dizziness and giddiness: Secondary | ICD-10-CM | POA: Diagnosis not present

## 2018-07-30 DIAGNOSIS — C9002 Multiple myeloma in relapse: Secondary | ICD-10-CM | POA: Diagnosis not present

## 2018-07-30 DIAGNOSIS — I10 Essential (primary) hypertension: Secondary | ICD-10-CM | POA: Diagnosis not present

## 2018-07-30 DIAGNOSIS — C4491 Basal cell carcinoma of skin, unspecified: Secondary | ICD-10-CM | POA: Diagnosis not present

## 2018-07-30 DIAGNOSIS — G893 Neoplasm related pain (acute) (chronic): Secondary | ICD-10-CM | POA: Diagnosis not present

## 2018-07-30 DIAGNOSIS — C9000 Multiple myeloma not having achieved remission: Secondary | ICD-10-CM | POA: Diagnosis not present

## 2018-08-06 DIAGNOSIS — R5383 Other fatigue: Secondary | ICD-10-CM | POA: Diagnosis not present

## 2018-08-06 DIAGNOSIS — C9002 Multiple myeloma in relapse: Secondary | ICD-10-CM | POA: Diagnosis not present

## 2018-08-06 DIAGNOSIS — Z9484 Stem cells transplant status: Secondary | ICD-10-CM | POA: Diagnosis not present

## 2018-08-06 DIAGNOSIS — G893 Neoplasm related pain (acute) (chronic): Secondary | ICD-10-CM | POA: Diagnosis not present

## 2018-09-02 DIAGNOSIS — C9002 Multiple myeloma in relapse: Secondary | ICD-10-CM | POA: Diagnosis not present

## 2018-09-09 DIAGNOSIS — I2699 Other pulmonary embolism without acute cor pulmonale: Secondary | ICD-10-CM | POA: Diagnosis not present

## 2018-09-09 DIAGNOSIS — N183 Chronic kidney disease, stage 3 (moderate): Secondary | ICD-10-CM | POA: Diagnosis not present

## 2018-09-09 DIAGNOSIS — Z9484 Stem cells transplant status: Secondary | ICD-10-CM | POA: Diagnosis not present

## 2018-09-09 DIAGNOSIS — C9002 Multiple myeloma in relapse: Secondary | ICD-10-CM | POA: Diagnosis not present

## 2018-09-11 DIAGNOSIS — I2699 Other pulmonary embolism without acute cor pulmonale: Secondary | ICD-10-CM | POA: Insufficient documentation

## 2018-09-19 DIAGNOSIS — Z9484 Stem cells transplant status: Secondary | ICD-10-CM | POA: Diagnosis not present

## 2018-09-19 DIAGNOSIS — C9000 Multiple myeloma not having achieved remission: Secondary | ICD-10-CM | POA: Diagnosis not present

## 2018-09-20 DIAGNOSIS — Z1159 Encounter for screening for other viral diseases: Secondary | ICD-10-CM | POA: Diagnosis not present

## 2018-09-20 DIAGNOSIS — N183 Chronic kidney disease, stage 3 (moderate): Secondary | ICD-10-CM | POA: Diagnosis not present

## 2018-09-20 DIAGNOSIS — Z9484 Stem cells transplant status: Secondary | ICD-10-CM | POA: Diagnosis not present

## 2018-09-20 DIAGNOSIS — C9002 Multiple myeloma in relapse: Secondary | ICD-10-CM | POA: Diagnosis not present

## 2018-09-20 DIAGNOSIS — I2699 Other pulmonary embolism without acute cor pulmonale: Secondary | ICD-10-CM | POA: Diagnosis not present

## 2018-09-23 DIAGNOSIS — E785 Hyperlipidemia, unspecified: Secondary | ICD-10-CM | POA: Diagnosis not present

## 2018-09-23 DIAGNOSIS — I129 Hypertensive chronic kidney disease with stage 1 through stage 4 chronic kidney disease, or unspecified chronic kidney disease: Secondary | ICD-10-CM | POA: Diagnosis not present

## 2018-09-23 DIAGNOSIS — F17210 Nicotine dependence, cigarettes, uncomplicated: Secondary | ICD-10-CM | POA: Diagnosis not present

## 2018-09-23 DIAGNOSIS — Z79899 Other long term (current) drug therapy: Secondary | ICD-10-CM | POA: Diagnosis not present

## 2018-09-23 DIAGNOSIS — Z452 Encounter for adjustment and management of vascular access device: Secondary | ICD-10-CM | POA: Diagnosis not present

## 2018-09-23 DIAGNOSIS — I10 Essential (primary) hypertension: Secondary | ICD-10-CM | POA: Diagnosis not present

## 2018-09-23 DIAGNOSIS — N183 Chronic kidney disease, stage 3 (moderate): Secondary | ICD-10-CM | POA: Diagnosis not present

## 2018-09-23 DIAGNOSIS — Z9484 Stem cells transplant status: Secondary | ICD-10-CM | POA: Diagnosis not present

## 2018-09-23 DIAGNOSIS — Z86718 Personal history of other venous thrombosis and embolism: Secondary | ICD-10-CM | POA: Diagnosis not present

## 2018-09-23 DIAGNOSIS — Z86711 Personal history of pulmonary embolism: Secondary | ICD-10-CM | POA: Diagnosis not present

## 2018-09-23 DIAGNOSIS — C9000 Multiple myeloma not having achieved remission: Secondary | ICD-10-CM | POA: Diagnosis not present

## 2018-09-23 DIAGNOSIS — G629 Polyneuropathy, unspecified: Secondary | ICD-10-CM | POA: Diagnosis not present

## 2018-09-23 DIAGNOSIS — E781 Pure hyperglyceridemia: Secondary | ICD-10-CM | POA: Diagnosis not present

## 2018-09-23 DIAGNOSIS — Z7901 Long term (current) use of anticoagulants: Secondary | ICD-10-CM | POA: Diagnosis not present

## 2018-09-25 DIAGNOSIS — R21 Rash and other nonspecific skin eruption: Secondary | ICD-10-CM | POA: Diagnosis not present

## 2018-09-25 DIAGNOSIS — L821 Other seborrheic keratosis: Secondary | ICD-10-CM | POA: Diagnosis not present

## 2018-09-25 DIAGNOSIS — I10 Essential (primary) hypertension: Secondary | ICD-10-CM | POA: Diagnosis not present

## 2018-09-27 DIAGNOSIS — G893 Neoplasm related pain (acute) (chronic): Secondary | ICD-10-CM | POA: Diagnosis not present

## 2018-09-27 DIAGNOSIS — C9002 Multiple myeloma in relapse: Secondary | ICD-10-CM | POA: Diagnosis not present

## 2018-09-27 DIAGNOSIS — Z7682 Awaiting organ transplant status: Secondary | ICD-10-CM | POA: Diagnosis not present

## 2018-09-27 DIAGNOSIS — Z9481 Bone marrow transplant status: Secondary | ICD-10-CM | POA: Diagnosis not present

## 2018-10-01 DIAGNOSIS — C9000 Multiple myeloma not having achieved remission: Secondary | ICD-10-CM | POA: Diagnosis not present

## 2018-10-01 DIAGNOSIS — Z9484 Stem cells transplant status: Secondary | ICD-10-CM | POA: Diagnosis not present

## 2018-10-01 DIAGNOSIS — Z5111 Encounter for antineoplastic chemotherapy: Secondary | ICD-10-CM | POA: Diagnosis not present

## 2018-10-02 DIAGNOSIS — C9000 Multiple myeloma not having achieved remission: Secondary | ICD-10-CM | POA: Diagnosis not present

## 2018-10-02 DIAGNOSIS — Z5112 Encounter for antineoplastic immunotherapy: Secondary | ICD-10-CM | POA: Diagnosis not present

## 2018-10-02 DIAGNOSIS — Z9484 Stem cells transplant status: Secondary | ICD-10-CM | POA: Diagnosis not present

## 2018-10-04 DIAGNOSIS — T451X5A Adverse effect of antineoplastic and immunosuppressive drugs, initial encounter: Secondary | ICD-10-CM | POA: Diagnosis not present

## 2018-10-04 DIAGNOSIS — C9002 Multiple myeloma in relapse: Secondary | ICD-10-CM | POA: Diagnosis not present

## 2018-10-04 DIAGNOSIS — G620 Drug-induced polyneuropathy: Secondary | ICD-10-CM | POA: Diagnosis not present

## 2018-10-08 DIAGNOSIS — K429 Umbilical hernia without obstruction or gangrene: Secondary | ICD-10-CM | POA: Diagnosis not present

## 2018-10-08 DIAGNOSIS — Z9484 Stem cells transplant status: Secondary | ICD-10-CM | POA: Diagnosis not present

## 2018-10-08 DIAGNOSIS — C9002 Multiple myeloma in relapse: Secondary | ICD-10-CM | POA: Diagnosis not present

## 2018-10-08 DIAGNOSIS — Z452 Encounter for adjustment and management of vascular access device: Secondary | ICD-10-CM | POA: Diagnosis not present

## 2018-10-08 DIAGNOSIS — K439 Ventral hernia without obstruction or gangrene: Secondary | ICD-10-CM | POA: Diagnosis not present

## 2018-10-09 DIAGNOSIS — C9002 Multiple myeloma in relapse: Secondary | ICD-10-CM | POA: Diagnosis not present

## 2018-10-09 DIAGNOSIS — Z5111 Encounter for antineoplastic chemotherapy: Secondary | ICD-10-CM | POA: Diagnosis not present

## 2018-10-09 DIAGNOSIS — Z9484 Stem cells transplant status: Secondary | ICD-10-CM | POA: Diagnosis not present

## 2018-10-11 DIAGNOSIS — Z9484 Stem cells transplant status: Secondary | ICD-10-CM | POA: Diagnosis not present

## 2018-10-11 DIAGNOSIS — C4491 Basal cell carcinoma of skin, unspecified: Secondary | ICD-10-CM | POA: Diagnosis not present

## 2018-10-11 DIAGNOSIS — I2699 Other pulmonary embolism without acute cor pulmonale: Secondary | ICD-10-CM | POA: Diagnosis not present

## 2018-10-11 DIAGNOSIS — C9002 Multiple myeloma in relapse: Secondary | ICD-10-CM | POA: Diagnosis not present

## 2018-10-11 DIAGNOSIS — G893 Neoplasm related pain (acute) (chronic): Secondary | ICD-10-CM | POA: Diagnosis not present

## 2018-10-11 DIAGNOSIS — Z09 Encounter for follow-up examination after completed treatment for conditions other than malignant neoplasm: Secondary | ICD-10-CM | POA: Diagnosis not present

## 2018-10-15 DIAGNOSIS — C9000 Multiple myeloma not having achieved remission: Secondary | ICD-10-CM | POA: Diagnosis not present

## 2018-10-15 DIAGNOSIS — Z9484 Stem cells transplant status: Secondary | ICD-10-CM | POA: Diagnosis not present

## 2018-10-16 DIAGNOSIS — C9000 Multiple myeloma not having achieved remission: Secondary | ICD-10-CM | POA: Diagnosis not present

## 2018-10-16 DIAGNOSIS — Z5111 Encounter for antineoplastic chemotherapy: Secondary | ICD-10-CM | POA: Diagnosis not present

## 2018-10-16 DIAGNOSIS — Z9484 Stem cells transplant status: Secondary | ICD-10-CM | POA: Diagnosis not present

## 2018-10-18 DIAGNOSIS — G620 Drug-induced polyneuropathy: Secondary | ICD-10-CM | POA: Diagnosis not present

## 2018-10-18 DIAGNOSIS — C9002 Multiple myeloma in relapse: Secondary | ICD-10-CM | POA: Diagnosis not present

## 2018-10-18 DIAGNOSIS — I82411 Acute embolism and thrombosis of right femoral vein: Secondary | ICD-10-CM | POA: Diagnosis not present

## 2018-10-18 DIAGNOSIS — N183 Chronic kidney disease, stage 3 (moderate): Secondary | ICD-10-CM | POA: Diagnosis not present

## 2018-10-18 DIAGNOSIS — I2699 Other pulmonary embolism without acute cor pulmonale: Secondary | ICD-10-CM | POA: Diagnosis not present

## 2018-10-18 DIAGNOSIS — T451X5A Adverse effect of antineoplastic and immunosuppressive drugs, initial encounter: Secondary | ICD-10-CM | POA: Diagnosis not present

## 2018-10-25 DIAGNOSIS — G893 Neoplasm related pain (acute) (chronic): Secondary | ICD-10-CM | POA: Diagnosis not present

## 2018-10-25 DIAGNOSIS — C9002 Multiple myeloma in relapse: Secondary | ICD-10-CM | POA: Diagnosis not present

## 2018-10-25 DIAGNOSIS — Z7682 Awaiting organ transplant status: Secondary | ICD-10-CM | POA: Diagnosis not present

## 2018-10-25 DIAGNOSIS — Z09 Encounter for follow-up examination after completed treatment for conditions other than malignant neoplasm: Secondary | ICD-10-CM | POA: Diagnosis not present

## 2018-10-28 DIAGNOSIS — G893 Neoplasm related pain (acute) (chronic): Secondary | ICD-10-CM | POA: Diagnosis not present

## 2018-10-28 DIAGNOSIS — Z9484 Stem cells transplant status: Secondary | ICD-10-CM | POA: Diagnosis not present

## 2018-10-28 DIAGNOSIS — C4491 Basal cell carcinoma of skin, unspecified: Secondary | ICD-10-CM | POA: Diagnosis not present

## 2018-10-29 DIAGNOSIS — Z9484 Stem cells transplant status: Secondary | ICD-10-CM | POA: Diagnosis not present

## 2018-10-29 DIAGNOSIS — Z5111 Encounter for antineoplastic chemotherapy: Secondary | ICD-10-CM | POA: Diagnosis not present

## 2018-10-29 DIAGNOSIS — C9000 Multiple myeloma not having achieved remission: Secondary | ICD-10-CM | POA: Diagnosis not present

## 2018-10-30 DIAGNOSIS — Z5111 Encounter for antineoplastic chemotherapy: Secondary | ICD-10-CM | POA: Diagnosis not present

## 2018-10-30 DIAGNOSIS — Z9484 Stem cells transplant status: Secondary | ICD-10-CM | POA: Diagnosis not present

## 2018-10-30 DIAGNOSIS — C9000 Multiple myeloma not having achieved remission: Secondary | ICD-10-CM | POA: Diagnosis not present

## 2018-11-01 DIAGNOSIS — Z9484 Stem cells transplant status: Secondary | ICD-10-CM | POA: Diagnosis not present

## 2018-11-01 DIAGNOSIS — C9002 Multiple myeloma in relapse: Secondary | ICD-10-CM | POA: Diagnosis not present

## 2018-11-05 DIAGNOSIS — Z9484 Stem cells transplant status: Secondary | ICD-10-CM | POA: Diagnosis not present

## 2018-11-05 DIAGNOSIS — C9000 Multiple myeloma not having achieved remission: Secondary | ICD-10-CM | POA: Diagnosis not present

## 2018-11-06 DIAGNOSIS — Z9484 Stem cells transplant status: Secondary | ICD-10-CM | POA: Diagnosis not present

## 2018-11-06 DIAGNOSIS — C9000 Multiple myeloma not having achieved remission: Secondary | ICD-10-CM | POA: Diagnosis not present

## 2018-11-08 DIAGNOSIS — N183 Chronic kidney disease, stage 3 unspecified: Secondary | ICD-10-CM | POA: Diagnosis not present

## 2018-11-08 DIAGNOSIS — G620 Drug-induced polyneuropathy: Secondary | ICD-10-CM | POA: Diagnosis not present

## 2018-11-08 DIAGNOSIS — I10 Essential (primary) hypertension: Secondary | ICD-10-CM | POA: Diagnosis not present

## 2018-11-08 DIAGNOSIS — C4491 Basal cell carcinoma of skin, unspecified: Secondary | ICD-10-CM | POA: Diagnosis not present

## 2018-11-08 DIAGNOSIS — C9002 Multiple myeloma in relapse: Secondary | ICD-10-CM | POA: Diagnosis not present

## 2018-11-08 DIAGNOSIS — T451X5A Adverse effect of antineoplastic and immunosuppressive drugs, initial encounter: Secondary | ICD-10-CM | POA: Diagnosis not present

## 2018-11-08 DIAGNOSIS — Z9484 Stem cells transplant status: Secondary | ICD-10-CM | POA: Diagnosis not present

## 2018-11-12 DIAGNOSIS — Z5112 Encounter for antineoplastic immunotherapy: Secondary | ICD-10-CM | POA: Diagnosis not present

## 2018-11-12 DIAGNOSIS — C9000 Multiple myeloma not having achieved remission: Secondary | ICD-10-CM | POA: Diagnosis not present

## 2018-11-12 DIAGNOSIS — Z9484 Stem cells transplant status: Secondary | ICD-10-CM | POA: Diagnosis not present

## 2018-11-13 DIAGNOSIS — Z9484 Stem cells transplant status: Secondary | ICD-10-CM | POA: Diagnosis not present

## 2018-11-13 DIAGNOSIS — C9000 Multiple myeloma not having achieved remission: Secondary | ICD-10-CM | POA: Diagnosis not present

## 2018-11-13 DIAGNOSIS — Z5111 Encounter for antineoplastic chemotherapy: Secondary | ICD-10-CM | POA: Diagnosis not present

## 2018-11-15 DIAGNOSIS — C9002 Multiple myeloma in relapse: Secondary | ICD-10-CM | POA: Diagnosis not present

## 2018-11-15 DIAGNOSIS — Z09 Encounter for follow-up examination after completed treatment for conditions other than malignant neoplasm: Secondary | ICD-10-CM | POA: Diagnosis not present

## 2018-11-15 DIAGNOSIS — C4491 Basal cell carcinoma of skin, unspecified: Secondary | ICD-10-CM | POA: Diagnosis not present

## 2018-11-15 DIAGNOSIS — Z9484 Stem cells transplant status: Secondary | ICD-10-CM | POA: Diagnosis not present

## 2018-11-15 DIAGNOSIS — N183 Chronic kidney disease, stage 3 unspecified: Secondary | ICD-10-CM | POA: Diagnosis not present

## 2018-11-15 DIAGNOSIS — G893 Neoplasm related pain (acute) (chronic): Secondary | ICD-10-CM | POA: Diagnosis not present

## 2018-11-22 DIAGNOSIS — G893 Neoplasm related pain (acute) (chronic): Secondary | ICD-10-CM | POA: Diagnosis not present

## 2018-11-22 DIAGNOSIS — C9002 Multiple myeloma in relapse: Secondary | ICD-10-CM | POA: Diagnosis not present

## 2018-11-22 DIAGNOSIS — Z9484 Stem cells transplant status: Secondary | ICD-10-CM | POA: Diagnosis not present

## 2018-11-26 DIAGNOSIS — Z9484 Stem cells transplant status: Secondary | ICD-10-CM | POA: Diagnosis not present

## 2018-11-26 DIAGNOSIS — Z5111 Encounter for antineoplastic chemotherapy: Secondary | ICD-10-CM | POA: Diagnosis not present

## 2018-11-26 DIAGNOSIS — C9002 Multiple myeloma in relapse: Secondary | ICD-10-CM | POA: Diagnosis not present

## 2018-11-27 DIAGNOSIS — Z5112 Encounter for antineoplastic immunotherapy: Secondary | ICD-10-CM | POA: Diagnosis not present

## 2018-11-27 DIAGNOSIS — C9000 Multiple myeloma not having achieved remission: Secondary | ICD-10-CM | POA: Diagnosis not present

## 2018-11-27 DIAGNOSIS — Z9484 Stem cells transplant status: Secondary | ICD-10-CM | POA: Diagnosis not present

## 2018-11-29 DIAGNOSIS — Z09 Encounter for follow-up examination after completed treatment for conditions other than malignant neoplasm: Secondary | ICD-10-CM | POA: Diagnosis not present

## 2018-11-29 DIAGNOSIS — R42 Dizziness and giddiness: Secondary | ICD-10-CM | POA: Diagnosis not present

## 2018-11-29 DIAGNOSIS — G893 Neoplasm related pain (acute) (chronic): Secondary | ICD-10-CM | POA: Diagnosis not present

## 2018-11-29 DIAGNOSIS — C9002 Multiple myeloma in relapse: Secondary | ICD-10-CM | POA: Diagnosis not present

## 2018-11-29 DIAGNOSIS — G8929 Other chronic pain: Secondary | ICD-10-CM | POA: Diagnosis not present

## 2018-11-29 DIAGNOSIS — Z7682 Awaiting organ transplant status: Secondary | ICD-10-CM | POA: Diagnosis not present

## 2018-11-29 DIAGNOSIS — Z9484 Stem cells transplant status: Secondary | ICD-10-CM | POA: Diagnosis not present

## 2018-11-29 DIAGNOSIS — M545 Low back pain: Secondary | ICD-10-CM | POA: Diagnosis not present

## 2018-11-29 DIAGNOSIS — G629 Polyneuropathy, unspecified: Secondary | ICD-10-CM | POA: Diagnosis not present

## 2018-11-29 DIAGNOSIS — C4491 Basal cell carcinoma of skin, unspecified: Secondary | ICD-10-CM | POA: Diagnosis not present

## 2018-12-03 DIAGNOSIS — Z5112 Encounter for antineoplastic immunotherapy: Secondary | ICD-10-CM | POA: Diagnosis not present

## 2018-12-03 DIAGNOSIS — C9000 Multiple myeloma not having achieved remission: Secondary | ICD-10-CM | POA: Diagnosis not present

## 2018-12-03 DIAGNOSIS — Z9484 Stem cells transplant status: Secondary | ICD-10-CM | POA: Diagnosis not present

## 2018-12-04 DIAGNOSIS — Z5112 Encounter for antineoplastic immunotherapy: Secondary | ICD-10-CM | POA: Diagnosis not present

## 2018-12-04 DIAGNOSIS — C9000 Multiple myeloma not having achieved remission: Secondary | ICD-10-CM | POA: Diagnosis not present

## 2018-12-04 DIAGNOSIS — Z9484 Stem cells transplant status: Secondary | ICD-10-CM | POA: Diagnosis not present

## 2018-12-06 DIAGNOSIS — C9002 Multiple myeloma in relapse: Secondary | ICD-10-CM | POA: Diagnosis not present

## 2018-12-06 DIAGNOSIS — M545 Low back pain: Secondary | ICD-10-CM | POA: Diagnosis not present

## 2018-12-06 DIAGNOSIS — Z7682 Awaiting organ transplant status: Secondary | ICD-10-CM | POA: Diagnosis not present

## 2018-12-06 DIAGNOSIS — G893 Neoplasm related pain (acute) (chronic): Secondary | ICD-10-CM | POA: Diagnosis not present

## 2018-12-06 DIAGNOSIS — Z9484 Stem cells transplant status: Secondary | ICD-10-CM | POA: Diagnosis not present

## 2018-12-06 DIAGNOSIS — G8929 Other chronic pain: Secondary | ICD-10-CM | POA: Diagnosis not present

## 2018-12-06 DIAGNOSIS — Z09 Encounter for follow-up examination after completed treatment for conditions other than malignant neoplasm: Secondary | ICD-10-CM | POA: Diagnosis not present

## 2018-12-10 DIAGNOSIS — Z5111 Encounter for antineoplastic chemotherapy: Secondary | ICD-10-CM | POA: Diagnosis not present

## 2018-12-10 DIAGNOSIS — Z9484 Stem cells transplant status: Secondary | ICD-10-CM | POA: Diagnosis not present

## 2018-12-10 DIAGNOSIS — C9002 Multiple myeloma in relapse: Secondary | ICD-10-CM | POA: Diagnosis not present

## 2018-12-11 DIAGNOSIS — Z9484 Stem cells transplant status: Secondary | ICD-10-CM | POA: Diagnosis not present

## 2018-12-11 DIAGNOSIS — Z5111 Encounter for antineoplastic chemotherapy: Secondary | ICD-10-CM | POA: Diagnosis not present

## 2018-12-11 DIAGNOSIS — C9002 Multiple myeloma in relapse: Secondary | ICD-10-CM | POA: Diagnosis not present

## 2018-12-13 DIAGNOSIS — G893 Neoplasm related pain (acute) (chronic): Secondary | ICD-10-CM | POA: Diagnosis not present

## 2018-12-13 DIAGNOSIS — C9002 Multiple myeloma in relapse: Secondary | ICD-10-CM | POA: Diagnosis not present

## 2018-12-13 DIAGNOSIS — C4491 Basal cell carcinoma of skin, unspecified: Secondary | ICD-10-CM | POA: Diagnosis not present

## 2018-12-13 DIAGNOSIS — Z09 Encounter for follow-up examination after completed treatment for conditions other than malignant neoplasm: Secondary | ICD-10-CM | POA: Diagnosis not present

## 2018-12-13 DIAGNOSIS — Z9484 Stem cells transplant status: Secondary | ICD-10-CM | POA: Diagnosis not present

## 2018-12-20 DIAGNOSIS — Z9484 Stem cells transplant status: Secondary | ICD-10-CM | POA: Diagnosis not present

## 2018-12-20 DIAGNOSIS — C9002 Multiple myeloma in relapse: Secondary | ICD-10-CM | POA: Diagnosis not present

## 2018-12-20 DIAGNOSIS — G893 Neoplasm related pain (acute) (chronic): Secondary | ICD-10-CM | POA: Diagnosis not present

## 2018-12-20 DIAGNOSIS — Z09 Encounter for follow-up examination after completed treatment for conditions other than malignant neoplasm: Secondary | ICD-10-CM | POA: Diagnosis not present

## 2018-12-24 DIAGNOSIS — Z9484 Stem cells transplant status: Secondary | ICD-10-CM | POA: Diagnosis not present

## 2018-12-24 DIAGNOSIS — C9000 Multiple myeloma not having achieved remission: Secondary | ICD-10-CM | POA: Diagnosis not present

## 2018-12-24 DIAGNOSIS — Z5111 Encounter for antineoplastic chemotherapy: Secondary | ICD-10-CM | POA: Diagnosis not present

## 2018-12-25 DIAGNOSIS — C9000 Multiple myeloma not having achieved remission: Secondary | ICD-10-CM | POA: Diagnosis not present

## 2018-12-25 DIAGNOSIS — Z5112 Encounter for antineoplastic immunotherapy: Secondary | ICD-10-CM | POA: Diagnosis not present

## 2018-12-25 DIAGNOSIS — Z9484 Stem cells transplant status: Secondary | ICD-10-CM | POA: Diagnosis not present

## 2018-12-27 DIAGNOSIS — G893 Neoplasm related pain (acute) (chronic): Secondary | ICD-10-CM | POA: Diagnosis not present

## 2018-12-27 DIAGNOSIS — C9002 Multiple myeloma in relapse: Secondary | ICD-10-CM | POA: Diagnosis not present

## 2018-12-27 DIAGNOSIS — C4491 Basal cell carcinoma of skin, unspecified: Secondary | ICD-10-CM | POA: Diagnosis not present

## 2018-12-27 DIAGNOSIS — I82411 Acute embolism and thrombosis of right femoral vein: Secondary | ICD-10-CM | POA: Diagnosis not present

## 2018-12-27 DIAGNOSIS — Z09 Encounter for follow-up examination after completed treatment for conditions other than malignant neoplasm: Secondary | ICD-10-CM | POA: Diagnosis not present

## 2018-12-27 DIAGNOSIS — D801 Nonfamilial hypogammaglobulinemia: Secondary | ICD-10-CM | POA: Diagnosis not present

## 2018-12-31 DIAGNOSIS — C9000 Multiple myeloma not having achieved remission: Secondary | ICD-10-CM | POA: Diagnosis not present

## 2018-12-31 DIAGNOSIS — Z9484 Stem cells transplant status: Secondary | ICD-10-CM | POA: Diagnosis not present

## 2018-12-31 DIAGNOSIS — Z5111 Encounter for antineoplastic chemotherapy: Secondary | ICD-10-CM | POA: Diagnosis not present

## 2019-01-01 DIAGNOSIS — Z5112 Encounter for antineoplastic immunotherapy: Secondary | ICD-10-CM | POA: Diagnosis not present

## 2019-01-01 DIAGNOSIS — C9002 Multiple myeloma in relapse: Secondary | ICD-10-CM | POA: Diagnosis not present

## 2019-01-01 DIAGNOSIS — Z9484 Stem cells transplant status: Secondary | ICD-10-CM | POA: Diagnosis not present

## 2019-01-06 DIAGNOSIS — C4491 Basal cell carcinoma of skin, unspecified: Secondary | ICD-10-CM | POA: Diagnosis not present

## 2019-01-06 DIAGNOSIS — C9002 Multiple myeloma in relapse: Secondary | ICD-10-CM | POA: Diagnosis not present

## 2019-01-06 DIAGNOSIS — L03031 Cellulitis of right toe: Secondary | ICD-10-CM | POA: Diagnosis not present

## 2019-01-06 DIAGNOSIS — N183 Chronic kidney disease, stage 3 unspecified: Secondary | ICD-10-CM | POA: Diagnosis not present

## 2019-01-06 DIAGNOSIS — Z09 Encounter for follow-up examination after completed treatment for conditions other than malignant neoplasm: Secondary | ICD-10-CM | POA: Diagnosis not present

## 2019-01-16 DIAGNOSIS — E6609 Other obesity due to excess calories: Secondary | ICD-10-CM | POA: Diagnosis not present

## 2019-01-16 DIAGNOSIS — Z6830 Body mass index (BMI) 30.0-30.9, adult: Secondary | ICD-10-CM | POA: Diagnosis not present

## 2019-01-16 DIAGNOSIS — Z9481 Bone marrow transplant status: Secondary | ICD-10-CM | POA: Diagnosis not present

## 2019-01-16 DIAGNOSIS — I10 Essential (primary) hypertension: Secondary | ICD-10-CM | POA: Diagnosis not present

## 2019-01-16 DIAGNOSIS — L03115 Cellulitis of right lower limb: Secondary | ICD-10-CM | POA: Diagnosis not present

## 2019-01-16 DIAGNOSIS — G894 Chronic pain syndrome: Secondary | ICD-10-CM | POA: Diagnosis not present

## 2019-01-16 DIAGNOSIS — D649 Anemia, unspecified: Secondary | ICD-10-CM | POA: Diagnosis not present

## 2019-01-16 DIAGNOSIS — C9000 Multiple myeloma not having achieved remission: Secondary | ICD-10-CM | POA: Diagnosis not present

## 2019-01-17 DIAGNOSIS — C4491 Basal cell carcinoma of skin, unspecified: Secondary | ICD-10-CM | POA: Diagnosis not present

## 2019-01-17 DIAGNOSIS — C9000 Multiple myeloma not having achieved remission: Secondary | ICD-10-CM | POA: Diagnosis not present

## 2019-01-17 DIAGNOSIS — G620 Drug-induced polyneuropathy: Secondary | ICD-10-CM | POA: Diagnosis not present

## 2019-01-17 DIAGNOSIS — T451X5A Adverse effect of antineoplastic and immunosuppressive drugs, initial encounter: Secondary | ICD-10-CM | POA: Diagnosis not present

## 2019-01-21 DIAGNOSIS — C9000 Multiple myeloma not having achieved remission: Secondary | ICD-10-CM | POA: Diagnosis not present

## 2019-01-21 DIAGNOSIS — Z5111 Encounter for antineoplastic chemotherapy: Secondary | ICD-10-CM | POA: Diagnosis not present

## 2019-01-21 DIAGNOSIS — Z9484 Stem cells transplant status: Secondary | ICD-10-CM | POA: Diagnosis not present

## 2019-01-22 DIAGNOSIS — G893 Neoplasm related pain (acute) (chronic): Secondary | ICD-10-CM | POA: Diagnosis not present

## 2019-01-22 DIAGNOSIS — C9002 Multiple myeloma in relapse: Secondary | ICD-10-CM | POA: Diagnosis not present

## 2019-01-22 DIAGNOSIS — Z9484 Stem cells transplant status: Secondary | ICD-10-CM | POA: Diagnosis not present

## 2019-01-22 DIAGNOSIS — Z5111 Encounter for antineoplastic chemotherapy: Secondary | ICD-10-CM | POA: Diagnosis not present

## 2019-01-24 DIAGNOSIS — T451X5A Adverse effect of antineoplastic and immunosuppressive drugs, initial encounter: Secondary | ICD-10-CM | POA: Diagnosis not present

## 2019-01-24 DIAGNOSIS — R110 Nausea: Secondary | ICD-10-CM | POA: Diagnosis not present

## 2019-01-24 DIAGNOSIS — S22000S Wedge compression fracture of unspecified thoracic vertebra, sequela: Secondary | ICD-10-CM | POA: Diagnosis not present

## 2019-01-24 DIAGNOSIS — G893 Neoplasm related pain (acute) (chronic): Secondary | ICD-10-CM | POA: Diagnosis not present

## 2019-01-24 DIAGNOSIS — K5903 Drug induced constipation: Secondary | ICD-10-CM | POA: Diagnosis not present

## 2019-01-24 DIAGNOSIS — C9002 Multiple myeloma in relapse: Secondary | ICD-10-CM | POA: Diagnosis not present

## 2019-01-24 DIAGNOSIS — Z9484 Stem cells transplant status: Secondary | ICD-10-CM | POA: Diagnosis not present

## 2019-01-24 DIAGNOSIS — Z09 Encounter for follow-up examination after completed treatment for conditions other than malignant neoplasm: Secondary | ICD-10-CM | POA: Diagnosis not present

## 2019-01-28 DIAGNOSIS — Z9484 Stem cells transplant status: Secondary | ICD-10-CM | POA: Diagnosis not present

## 2019-01-28 DIAGNOSIS — Z5112 Encounter for antineoplastic immunotherapy: Secondary | ICD-10-CM | POA: Diagnosis not present

## 2019-01-28 DIAGNOSIS — C9000 Multiple myeloma not having achieved remission: Secondary | ICD-10-CM | POA: Diagnosis not present

## 2019-01-29 DIAGNOSIS — C9000 Multiple myeloma not having achieved remission: Secondary | ICD-10-CM | POA: Diagnosis not present

## 2019-01-29 DIAGNOSIS — Z9484 Stem cells transplant status: Secondary | ICD-10-CM | POA: Diagnosis not present

## 2019-01-29 DIAGNOSIS — Z5111 Encounter for antineoplastic chemotherapy: Secondary | ICD-10-CM | POA: Diagnosis not present

## 2019-02-03 DIAGNOSIS — C9002 Multiple myeloma in relapse: Secondary | ICD-10-CM | POA: Diagnosis not present

## 2019-02-03 DIAGNOSIS — Z09 Encounter for follow-up examination after completed treatment for conditions other than malignant neoplasm: Secondary | ICD-10-CM | POA: Diagnosis not present

## 2019-02-04 DIAGNOSIS — Z5112 Encounter for antineoplastic immunotherapy: Secondary | ICD-10-CM | POA: Diagnosis not present

## 2019-02-04 DIAGNOSIS — C9000 Multiple myeloma not having achieved remission: Secondary | ICD-10-CM | POA: Diagnosis not present

## 2019-02-04 DIAGNOSIS — Z9484 Stem cells transplant status: Secondary | ICD-10-CM | POA: Diagnosis not present

## 2019-02-05 DIAGNOSIS — Z9484 Stem cells transplant status: Secondary | ICD-10-CM | POA: Diagnosis not present

## 2019-02-05 DIAGNOSIS — C9000 Multiple myeloma not having achieved remission: Secondary | ICD-10-CM | POA: Diagnosis not present

## 2019-02-05 DIAGNOSIS — Z5111 Encounter for antineoplastic chemotherapy: Secondary | ICD-10-CM | POA: Diagnosis not present

## 2019-02-13 DIAGNOSIS — Z20828 Contact with and (suspected) exposure to other viral communicable diseases: Secondary | ICD-10-CM | POA: Diagnosis not present

## 2019-02-21 DIAGNOSIS — M79672 Pain in left foot: Secondary | ICD-10-CM | POA: Diagnosis not present

## 2019-02-21 DIAGNOSIS — R0683 Snoring: Secondary | ICD-10-CM | POA: Diagnosis not present

## 2019-02-21 DIAGNOSIS — Z09 Encounter for follow-up examination after completed treatment for conditions other than malignant neoplasm: Secondary | ICD-10-CM | POA: Diagnosis not present

## 2019-02-21 DIAGNOSIS — R609 Edema, unspecified: Secondary | ICD-10-CM | POA: Diagnosis not present

## 2019-02-21 DIAGNOSIS — J302 Other seasonal allergic rhinitis: Secondary | ICD-10-CM | POA: Diagnosis not present

## 2019-02-21 DIAGNOSIS — M25551 Pain in right hip: Secondary | ICD-10-CM | POA: Diagnosis not present

## 2019-02-21 DIAGNOSIS — M4850XD Collapsed vertebra, not elsewhere classified, site unspecified, subsequent encounter for fracture with routine healing: Secondary | ICD-10-CM | POA: Diagnosis not present

## 2019-02-21 DIAGNOSIS — K5900 Constipation, unspecified: Secondary | ICD-10-CM | POA: Diagnosis not present

## 2019-02-21 DIAGNOSIS — D649 Anemia, unspecified: Secondary | ICD-10-CM | POA: Diagnosis not present

## 2019-02-21 DIAGNOSIS — I959 Hypotension, unspecified: Secondary | ICD-10-CM | POA: Diagnosis not present

## 2019-02-21 DIAGNOSIS — Z9481 Bone marrow transplant status: Secondary | ICD-10-CM | POA: Diagnosis not present

## 2019-02-21 DIAGNOSIS — H9319 Tinnitus, unspecified ear: Secondary | ICD-10-CM | POA: Diagnosis not present

## 2019-02-21 DIAGNOSIS — E785 Hyperlipidemia, unspecified: Secondary | ICD-10-CM | POA: Diagnosis not present

## 2019-02-21 DIAGNOSIS — Z20822 Contact with and (suspected) exposure to covid-19: Secondary | ICD-10-CM | POA: Diagnosis not present

## 2019-02-21 DIAGNOSIS — R110 Nausea: Secondary | ICD-10-CM | POA: Diagnosis not present

## 2019-02-21 DIAGNOSIS — T451X5A Adverse effect of antineoplastic and immunosuppressive drugs, initial encounter: Secondary | ICD-10-CM | POA: Diagnosis not present

## 2019-02-21 DIAGNOSIS — C9002 Multiple myeloma in relapse: Secondary | ICD-10-CM | POA: Diagnosis not present

## 2019-02-21 DIAGNOSIS — R635 Abnormal weight gain: Secondary | ICD-10-CM | POA: Diagnosis not present

## 2019-02-21 DIAGNOSIS — M25552 Pain in left hip: Secondary | ICD-10-CM | POA: Diagnosis not present

## 2019-02-21 DIAGNOSIS — G600 Hereditary motor and sensory neuropathy: Secondary | ICD-10-CM | POA: Diagnosis not present

## 2019-02-21 DIAGNOSIS — Z9484 Stem cells transplant status: Secondary | ICD-10-CM | POA: Diagnosis not present

## 2019-02-21 DIAGNOSIS — R42 Dizziness and giddiness: Secondary | ICD-10-CM | POA: Diagnosis not present

## 2019-02-21 DIAGNOSIS — G893 Neoplasm related pain (acute) (chronic): Secondary | ICD-10-CM | POA: Diagnosis not present

## 2019-02-21 DIAGNOSIS — J3489 Other specified disorders of nose and nasal sinuses: Secondary | ICD-10-CM | POA: Diagnosis not present

## 2019-02-21 DIAGNOSIS — Z7901 Long term (current) use of anticoagulants: Secondary | ICD-10-CM | POA: Diagnosis not present

## 2019-02-21 DIAGNOSIS — I10 Essential (primary) hypertension: Secondary | ICD-10-CM | POA: Diagnosis not present

## 2019-02-25 DIAGNOSIS — Z5112 Encounter for antineoplastic immunotherapy: Secondary | ICD-10-CM | POA: Diagnosis not present

## 2019-02-25 DIAGNOSIS — C9002 Multiple myeloma in relapse: Secondary | ICD-10-CM | POA: Diagnosis not present

## 2019-02-26 DIAGNOSIS — Z9484 Stem cells transplant status: Secondary | ICD-10-CM | POA: Diagnosis not present

## 2019-02-26 DIAGNOSIS — C9000 Multiple myeloma not having achieved remission: Secondary | ICD-10-CM | POA: Diagnosis not present

## 2019-02-26 DIAGNOSIS — Z5111 Encounter for antineoplastic chemotherapy: Secondary | ICD-10-CM | POA: Diagnosis not present

## 2019-02-28 DIAGNOSIS — C9002 Multiple myeloma in relapse: Secondary | ICD-10-CM | POA: Diagnosis not present

## 2019-03-04 DIAGNOSIS — C9000 Multiple myeloma not having achieved remission: Secondary | ICD-10-CM | POA: Diagnosis not present

## 2019-03-04 DIAGNOSIS — Z5112 Encounter for antineoplastic immunotherapy: Secondary | ICD-10-CM | POA: Diagnosis not present

## 2019-03-04 DIAGNOSIS — Z9484 Stem cells transplant status: Secondary | ICD-10-CM | POA: Diagnosis not present

## 2019-03-05 DIAGNOSIS — Z9484 Stem cells transplant status: Secondary | ICD-10-CM | POA: Diagnosis not present

## 2019-03-05 DIAGNOSIS — Z5111 Encounter for antineoplastic chemotherapy: Secondary | ICD-10-CM | POA: Diagnosis not present

## 2019-03-05 DIAGNOSIS — C9000 Multiple myeloma not having achieved remission: Secondary | ICD-10-CM | POA: Diagnosis not present

## 2019-03-07 DIAGNOSIS — C9002 Multiple myeloma in relapse: Secondary | ICD-10-CM | POA: Diagnosis not present

## 2019-03-07 DIAGNOSIS — C4491 Basal cell carcinoma of skin, unspecified: Secondary | ICD-10-CM | POA: Diagnosis not present

## 2019-03-11 DIAGNOSIS — Z5111 Encounter for antineoplastic chemotherapy: Secondary | ICD-10-CM | POA: Diagnosis not present

## 2019-03-11 DIAGNOSIS — C9002 Multiple myeloma in relapse: Secondary | ICD-10-CM | POA: Diagnosis not present

## 2019-03-11 DIAGNOSIS — Z9484 Stem cells transplant status: Secondary | ICD-10-CM | POA: Diagnosis not present

## 2019-03-12 DIAGNOSIS — Z5111 Encounter for antineoplastic chemotherapy: Secondary | ICD-10-CM | POA: Diagnosis not present

## 2019-03-12 DIAGNOSIS — C9002 Multiple myeloma in relapse: Secondary | ICD-10-CM | POA: Diagnosis not present

## 2019-03-21 DIAGNOSIS — Z9484 Stem cells transplant status: Secondary | ICD-10-CM | POA: Diagnosis not present

## 2019-03-21 DIAGNOSIS — C9002 Multiple myeloma in relapse: Secondary | ICD-10-CM | POA: Diagnosis not present

## 2019-03-21 DIAGNOSIS — G893 Neoplasm related pain (acute) (chronic): Secondary | ICD-10-CM | POA: Diagnosis not present

## 2019-03-21 DIAGNOSIS — Z09 Encounter for follow-up examination after completed treatment for conditions other than malignant neoplasm: Secondary | ICD-10-CM | POA: Diagnosis not present

## 2019-03-26 ENCOUNTER — Ambulatory Visit: Payer: PPO | Attending: Internal Medicine

## 2019-03-26 DIAGNOSIS — Z23 Encounter for immunization: Secondary | ICD-10-CM | POA: Insufficient documentation

## 2019-03-26 NOTE — Progress Notes (Signed)
   Covid-19 Vaccination Clinic  Name:  Craig Rivera    MRN: [id number] DOB: 1956/01/09  03/26/2019  [name] was observed post Covid-19 immunization for 30 minutes based on pre-vaccination screening without incidence. He was provided with Vaccine Information Sheet and instruction to access the V-Safe system.   [name] was instructed to call 911 with any severe reactions post vaccine: Marland Kitchen Difficulty breathing  . Swelling of your face and throat  . A fast heartbeat  . A bad rash all over your body  . Dizziness and weakness    Immunizations Administered    Name Date Dose VIS Date Route   Moderna COVID-19 Vaccine 03/26/2019 10:03 AM 0.5 mL 01/07/2019 Intramuscular   Manufacturer: Moderna   Lot: [id number]   [id number]

## 2019-03-28 DIAGNOSIS — G620 Drug-induced polyneuropathy: Secondary | ICD-10-CM | POA: Diagnosis not present

## 2019-03-28 DIAGNOSIS — C9002 Multiple myeloma in relapse: Secondary | ICD-10-CM | POA: Diagnosis not present

## 2019-03-28 DIAGNOSIS — C4491 Basal cell carcinoma of skin, unspecified: Secondary | ICD-10-CM | POA: Diagnosis not present

## 2019-03-28 DIAGNOSIS — T451X5A Adverse effect of antineoplastic and immunosuppressive drugs, initial encounter: Secondary | ICD-10-CM | POA: Diagnosis not present

## 2019-04-01 DIAGNOSIS — Z9484 Stem cells transplant status: Secondary | ICD-10-CM | POA: Diagnosis not present

## 2019-04-01 DIAGNOSIS — C9000 Multiple myeloma not having achieved remission: Secondary | ICD-10-CM | POA: Diagnosis not present

## 2019-04-01 DIAGNOSIS — Z5111 Encounter for antineoplastic chemotherapy: Secondary | ICD-10-CM | POA: Diagnosis not present

## 2019-04-02 DIAGNOSIS — C9000 Multiple myeloma not having achieved remission: Secondary | ICD-10-CM | POA: Diagnosis not present

## 2019-04-02 DIAGNOSIS — Z5111 Encounter for antineoplastic chemotherapy: Secondary | ICD-10-CM | POA: Diagnosis not present

## 2019-04-02 DIAGNOSIS — Z9484 Stem cells transplant status: Secondary | ICD-10-CM | POA: Diagnosis not present

## 2019-04-04 DIAGNOSIS — C9002 Multiple myeloma in relapse: Secondary | ICD-10-CM | POA: Diagnosis not present

## 2019-04-04 DIAGNOSIS — G893 Neoplasm related pain (acute) (chronic): Secondary | ICD-10-CM | POA: Diagnosis not present

## 2019-04-04 DIAGNOSIS — C4491 Basal cell carcinoma of skin, unspecified: Secondary | ICD-10-CM | POA: Diagnosis not present

## 2019-04-08 DIAGNOSIS — Z5111 Encounter for antineoplastic chemotherapy: Secondary | ICD-10-CM | POA: Diagnosis not present

## 2019-04-08 DIAGNOSIS — C9002 Multiple myeloma in relapse: Secondary | ICD-10-CM | POA: Diagnosis not present

## 2019-04-08 DIAGNOSIS — Z9484 Stem cells transplant status: Secondary | ICD-10-CM | POA: Diagnosis not present

## 2019-04-09 DIAGNOSIS — C9000 Multiple myeloma not having achieved remission: Secondary | ICD-10-CM | POA: Diagnosis not present

## 2019-04-09 DIAGNOSIS — Z9484 Stem cells transplant status: Secondary | ICD-10-CM | POA: Diagnosis not present

## 2019-04-09 DIAGNOSIS — Z5111 Encounter for antineoplastic chemotherapy: Secondary | ICD-10-CM | POA: Diagnosis not present

## 2019-04-11 DIAGNOSIS — Z09 Encounter for follow-up examination after completed treatment for conditions other than malignant neoplasm: Secondary | ICD-10-CM | POA: Diagnosis not present

## 2019-04-11 DIAGNOSIS — C9002 Multiple myeloma in relapse: Secondary | ICD-10-CM | POA: Diagnosis not present

## 2019-04-15 DIAGNOSIS — Z5112 Encounter for antineoplastic immunotherapy: Secondary | ICD-10-CM | POA: Diagnosis not present

## 2019-04-15 DIAGNOSIS — Z9484 Stem cells transplant status: Secondary | ICD-10-CM | POA: Diagnosis not present

## 2019-04-15 DIAGNOSIS — C9000 Multiple myeloma not having achieved remission: Secondary | ICD-10-CM | POA: Diagnosis not present

## 2019-04-16 DIAGNOSIS — Z9484 Stem cells transplant status: Secondary | ICD-10-CM | POA: Diagnosis not present

## 2019-04-16 DIAGNOSIS — Z5111 Encounter for antineoplastic chemotherapy: Secondary | ICD-10-CM | POA: Diagnosis not present

## 2019-04-16 DIAGNOSIS — C9000 Multiple myeloma not having achieved remission: Secondary | ICD-10-CM | POA: Diagnosis not present

## 2019-04-18 DIAGNOSIS — S22000S Wedge compression fracture of unspecified thoracic vertebra, sequela: Secondary | ICD-10-CM | POA: Diagnosis not present

## 2019-04-18 DIAGNOSIS — Z09 Encounter for follow-up examination after completed treatment for conditions other than malignant neoplasm: Secondary | ICD-10-CM | POA: Diagnosis not present

## 2019-04-18 DIAGNOSIS — C9002 Multiple myeloma in relapse: Secondary | ICD-10-CM | POA: Diagnosis not present

## 2019-04-18 DIAGNOSIS — Z9484 Stem cells transplant status: Secondary | ICD-10-CM | POA: Diagnosis not present

## 2019-04-18 DIAGNOSIS — G893 Neoplasm related pain (acute) (chronic): Secondary | ICD-10-CM | POA: Diagnosis not present

## 2019-04-23 ENCOUNTER — Ambulatory Visit: Payer: PPO | Attending: Internal Medicine

## 2019-04-23 DIAGNOSIS — Z23 Encounter for immunization: Secondary | ICD-10-CM

## 2019-04-23 NOTE — Progress Notes (Signed)
   Covid-19 Vaccination Clinic  Name:  Craig Rivera    MRN: [id number] DOB: 02-09-1955  04/23/2019  [name] was observed post Covid-19 immunization for 15 minutes without incident. He was provided with Vaccine Information Sheet and instruction to access the V-Safe system.   [name] was instructed to call 911 with any severe reactions post vaccine: Marland Kitchen Difficulty breathing  . Swelling of face and throat  . A fast heartbeat  . A bad rash all over body  . Dizziness and weakness   Immunizations Administered    Name Date Dose VIS Date Route   Moderna COVID-19 Vaccine 04/23/2019  9:48 AM 0.5 mL 01/07/2019 Intramuscular   Manufacturer: Moderna   Lot: [id number]   [id number]

## 2019-04-24 DIAGNOSIS — C4491 Basal cell carcinoma of skin, unspecified: Secondary | ICD-10-CM | POA: Diagnosis not present

## 2019-04-24 DIAGNOSIS — Z09 Encounter for follow-up examination after completed treatment for conditions other than malignant neoplasm: Secondary | ICD-10-CM | POA: Diagnosis not present

## 2019-05-02 DIAGNOSIS — C9002 Multiple myeloma in relapse: Secondary | ICD-10-CM | POA: Diagnosis not present

## 2019-05-02 DIAGNOSIS — Z09 Encounter for follow-up examination after completed treatment for conditions other than malignant neoplasm: Secondary | ICD-10-CM | POA: Diagnosis not present

## 2019-05-02 DIAGNOSIS — C4491 Basal cell carcinoma of skin, unspecified: Secondary | ICD-10-CM | POA: Diagnosis not present

## 2019-05-06 DIAGNOSIS — Z5111 Encounter for antineoplastic chemotherapy: Secondary | ICD-10-CM | POA: Diagnosis not present

## 2019-05-06 DIAGNOSIS — Z9484 Stem cells transplant status: Secondary | ICD-10-CM | POA: Diagnosis not present

## 2019-05-06 DIAGNOSIS — C9000 Multiple myeloma not having achieved remission: Secondary | ICD-10-CM | POA: Diagnosis not present

## 2019-05-07 DIAGNOSIS — Z5111 Encounter for antineoplastic chemotherapy: Secondary | ICD-10-CM | POA: Diagnosis not present

## 2019-05-07 DIAGNOSIS — Z9484 Stem cells transplant status: Secondary | ICD-10-CM | POA: Diagnosis not present

## 2019-05-07 DIAGNOSIS — C9000 Multiple myeloma not having achieved remission: Secondary | ICD-10-CM | POA: Diagnosis not present

## 2019-05-12 DIAGNOSIS — C9002 Multiple myeloma in relapse: Secondary | ICD-10-CM | POA: Diagnosis not present

## 2019-05-12 DIAGNOSIS — Z09 Encounter for follow-up examination after completed treatment for conditions other than malignant neoplasm: Secondary | ICD-10-CM | POA: Diagnosis not present

## 2019-05-12 DIAGNOSIS — C4491 Basal cell carcinoma of skin, unspecified: Secondary | ICD-10-CM | POA: Diagnosis not present

## 2019-05-13 DIAGNOSIS — R10814 Left lower quadrant abdominal tenderness: Secondary | ICD-10-CM | POA: Diagnosis not present

## 2019-05-16 DIAGNOSIS — G893 Neoplasm related pain (acute) (chronic): Secondary | ICD-10-CM | POA: Diagnosis not present

## 2019-05-16 DIAGNOSIS — S22000S Wedge compression fracture of unspecified thoracic vertebra, sequela: Secondary | ICD-10-CM | POA: Diagnosis not present

## 2019-05-16 DIAGNOSIS — C4491 Basal cell carcinoma of skin, unspecified: Secondary | ICD-10-CM | POA: Diagnosis not present

## 2019-05-16 DIAGNOSIS — C9002 Multiple myeloma in relapse: Secondary | ICD-10-CM | POA: Diagnosis not present

## 2019-05-16 DIAGNOSIS — D801 Nonfamilial hypogammaglobulinemia: Secondary | ICD-10-CM | POA: Diagnosis not present

## 2019-05-16 DIAGNOSIS — Z9484 Stem cells transplant status: Secondary | ICD-10-CM | POA: Diagnosis not present

## 2019-05-16 DIAGNOSIS — Z09 Encounter for follow-up examination after completed treatment for conditions other than malignant neoplasm: Secondary | ICD-10-CM | POA: Diagnosis not present

## 2019-05-23 DIAGNOSIS — Z09 Encounter for follow-up examination after completed treatment for conditions other than malignant neoplasm: Secondary | ICD-10-CM | POA: Diagnosis not present

## 2019-05-23 DIAGNOSIS — C4491 Basal cell carcinoma of skin, unspecified: Secondary | ICD-10-CM | POA: Diagnosis not present

## 2019-05-27 DIAGNOSIS — C9000 Multiple myeloma not having achieved remission: Secondary | ICD-10-CM | POA: Diagnosis not present

## 2019-05-27 DIAGNOSIS — Z9484 Stem cells transplant status: Secondary | ICD-10-CM | POA: Diagnosis not present

## 2019-05-27 DIAGNOSIS — Z5111 Encounter for antineoplastic chemotherapy: Secondary | ICD-10-CM | POA: Diagnosis not present

## 2019-05-28 DIAGNOSIS — Z9484 Stem cells transplant status: Secondary | ICD-10-CM | POA: Diagnosis not present

## 2019-05-28 DIAGNOSIS — C9000 Multiple myeloma not having achieved remission: Secondary | ICD-10-CM | POA: Diagnosis not present

## 2019-05-28 DIAGNOSIS — Z5111 Encounter for antineoplastic chemotherapy: Secondary | ICD-10-CM | POA: Diagnosis not present

## 2019-05-30 DIAGNOSIS — Z09 Encounter for follow-up examination after completed treatment for conditions other than malignant neoplasm: Secondary | ICD-10-CM | POA: Diagnosis not present

## 2019-05-30 DIAGNOSIS — C9002 Multiple myeloma in relapse: Secondary | ICD-10-CM | POA: Diagnosis not present

## 2019-05-30 DIAGNOSIS — C4491 Basal cell carcinoma of skin, unspecified: Secondary | ICD-10-CM | POA: Diagnosis not present

## 2019-06-03 DIAGNOSIS — C9000 Multiple myeloma not having achieved remission: Secondary | ICD-10-CM | POA: Diagnosis not present

## 2019-06-03 DIAGNOSIS — Z5112 Encounter for antineoplastic immunotherapy: Secondary | ICD-10-CM | POA: Diagnosis not present

## 2019-06-03 DIAGNOSIS — Z9484 Stem cells transplant status: Secondary | ICD-10-CM | POA: Diagnosis not present

## 2019-06-04 DIAGNOSIS — C9000 Multiple myeloma not having achieved remission: Secondary | ICD-10-CM | POA: Diagnosis not present

## 2019-06-04 DIAGNOSIS — Z5112 Encounter for antineoplastic immunotherapy: Secondary | ICD-10-CM | POA: Diagnosis not present

## 2019-06-04 DIAGNOSIS — Z9484 Stem cells transplant status: Secondary | ICD-10-CM | POA: Diagnosis not present

## 2019-06-13 DIAGNOSIS — Z09 Encounter for follow-up examination after completed treatment for conditions other than malignant neoplasm: Secondary | ICD-10-CM | POA: Diagnosis not present

## 2019-06-13 DIAGNOSIS — K5732 Diverticulitis of large intestine without perforation or abscess without bleeding: Secondary | ICD-10-CM | POA: Diagnosis not present

## 2019-06-13 DIAGNOSIS — G893 Neoplasm related pain (acute) (chronic): Secondary | ICD-10-CM | POA: Diagnosis not present

## 2019-06-13 DIAGNOSIS — C9002 Multiple myeloma in relapse: Secondary | ICD-10-CM | POA: Diagnosis not present

## 2019-06-13 DIAGNOSIS — S22000S Wedge compression fracture of unspecified thoracic vertebra, sequela: Secondary | ICD-10-CM | POA: Diagnosis not present

## 2019-06-13 DIAGNOSIS — D801 Nonfamilial hypogammaglobulinemia: Secondary | ICD-10-CM | POA: Diagnosis not present

## 2019-06-17 DIAGNOSIS — Z9484 Stem cells transplant status: Secondary | ICD-10-CM | POA: Diagnosis not present

## 2019-06-17 DIAGNOSIS — C9000 Multiple myeloma not having achieved remission: Secondary | ICD-10-CM | POA: Diagnosis not present

## 2019-06-17 DIAGNOSIS — Z5112 Encounter for antineoplastic immunotherapy: Secondary | ICD-10-CM | POA: Diagnosis not present

## 2019-06-18 DIAGNOSIS — Z5111 Encounter for antineoplastic chemotherapy: Secondary | ICD-10-CM | POA: Diagnosis not present

## 2019-06-18 DIAGNOSIS — C9000 Multiple myeloma not having achieved remission: Secondary | ICD-10-CM | POA: Diagnosis not present

## 2019-06-18 DIAGNOSIS — Z9484 Stem cells transplant status: Secondary | ICD-10-CM | POA: Diagnosis not present

## 2019-06-20 DIAGNOSIS — D801 Nonfamilial hypogammaglobulinemia: Secondary | ICD-10-CM | POA: Diagnosis not present

## 2019-06-20 DIAGNOSIS — C4491 Basal cell carcinoma of skin, unspecified: Secondary | ICD-10-CM | POA: Diagnosis not present

## 2019-06-24 DIAGNOSIS — C9000 Multiple myeloma not having achieved remission: Secondary | ICD-10-CM | POA: Diagnosis not present

## 2019-06-24 DIAGNOSIS — Z9484 Stem cells transplant status: Secondary | ICD-10-CM | POA: Diagnosis not present

## 2019-06-25 DIAGNOSIS — Z9484 Stem cells transplant status: Secondary | ICD-10-CM | POA: Diagnosis not present

## 2019-06-25 DIAGNOSIS — Z5111 Encounter for antineoplastic chemotherapy: Secondary | ICD-10-CM | POA: Diagnosis not present

## 2019-06-25 DIAGNOSIS — C9002 Multiple myeloma in relapse: Secondary | ICD-10-CM | POA: Diagnosis not present

## 2019-06-27 DIAGNOSIS — C9002 Multiple myeloma in relapse: Secondary | ICD-10-CM | POA: Diagnosis not present

## 2019-06-27 DIAGNOSIS — C4491 Basal cell carcinoma of skin, unspecified: Secondary | ICD-10-CM | POA: Diagnosis not present

## 2019-07-01 DIAGNOSIS — Z9484 Stem cells transplant status: Secondary | ICD-10-CM | POA: Diagnosis not present

## 2019-07-01 DIAGNOSIS — C9000 Multiple myeloma not having achieved remission: Secondary | ICD-10-CM | POA: Diagnosis not present

## 2019-07-01 DIAGNOSIS — Z5111 Encounter for antineoplastic chemotherapy: Secondary | ICD-10-CM | POA: Diagnosis not present

## 2019-07-02 DIAGNOSIS — C9000 Multiple myeloma not having achieved remission: Secondary | ICD-10-CM | POA: Diagnosis not present

## 2019-07-02 DIAGNOSIS — Z5111 Encounter for antineoplastic chemotherapy: Secondary | ICD-10-CM | POA: Diagnosis not present

## 2019-07-02 DIAGNOSIS — Z9484 Stem cells transplant status: Secondary | ICD-10-CM | POA: Diagnosis not present

## 2019-07-11 DIAGNOSIS — D801 Nonfamilial hypogammaglobulinemia: Secondary | ICD-10-CM | POA: Diagnosis not present

## 2019-07-11 DIAGNOSIS — Z09 Encounter for follow-up examination after completed treatment for conditions other than malignant neoplasm: Secondary | ICD-10-CM | POA: Diagnosis not present

## 2019-07-11 DIAGNOSIS — G893 Neoplasm related pain (acute) (chronic): Secondary | ICD-10-CM | POA: Diagnosis not present

## 2019-07-11 DIAGNOSIS — Z9484 Stem cells transplant status: Secondary | ICD-10-CM | POA: Diagnosis not present

## 2019-07-11 DIAGNOSIS — C9002 Multiple myeloma in relapse: Secondary | ICD-10-CM | POA: Diagnosis not present

## 2019-07-11 DIAGNOSIS — S22000S Wedge compression fracture of unspecified thoracic vertebra, sequela: Secondary | ICD-10-CM | POA: Diagnosis not present

## 2019-07-14 DIAGNOSIS — S22060A Wedge compression fracture of T7-T8 vertebra, initial encounter for closed fracture: Secondary | ICD-10-CM | POA: Diagnosis not present

## 2019-07-14 DIAGNOSIS — I700 Atherosclerosis of aorta: Secondary | ICD-10-CM | POA: Diagnosis not present

## 2019-07-14 DIAGNOSIS — C9002 Multiple myeloma in relapse: Secondary | ICD-10-CM | POA: Diagnosis not present

## 2019-07-14 DIAGNOSIS — C9000 Multiple myeloma not having achieved remission: Secondary | ICD-10-CM | POA: Diagnosis not present

## 2019-07-14 DIAGNOSIS — S22000S Wedge compression fracture of unspecified thoracic vertebra, sequela: Secondary | ICD-10-CM | POA: Diagnosis not present

## 2019-07-15 DIAGNOSIS — Z9484 Stem cells transplant status: Secondary | ICD-10-CM | POA: Diagnosis not present

## 2019-07-15 DIAGNOSIS — C9000 Multiple myeloma not having achieved remission: Secondary | ICD-10-CM | POA: Diagnosis not present

## 2019-07-15 DIAGNOSIS — Z5111 Encounter for antineoplastic chemotherapy: Secondary | ICD-10-CM | POA: Diagnosis not present

## 2019-07-16 DIAGNOSIS — Z5111 Encounter for antineoplastic chemotherapy: Secondary | ICD-10-CM | POA: Diagnosis not present

## 2019-07-18 DIAGNOSIS — C4491 Basal cell carcinoma of skin, unspecified: Secondary | ICD-10-CM | POA: Diagnosis not present

## 2019-07-18 DIAGNOSIS — D801 Nonfamilial hypogammaglobulinemia: Secondary | ICD-10-CM | POA: Diagnosis not present

## 2019-07-18 DIAGNOSIS — Z09 Encounter for follow-up examination after completed treatment for conditions other than malignant neoplasm: Secondary | ICD-10-CM | POA: Diagnosis not present

## 2019-07-18 DIAGNOSIS — G893 Neoplasm related pain (acute) (chronic): Secondary | ICD-10-CM | POA: Diagnosis not present

## 2019-07-18 DIAGNOSIS — C9002 Multiple myeloma in relapse: Secondary | ICD-10-CM | POA: Diagnosis not present

## 2019-07-18 DIAGNOSIS — M4854XS Collapsed vertebra, not elsewhere classified, thoracic region, sequela of fracture: Secondary | ICD-10-CM | POA: Diagnosis not present

## 2019-07-18 DIAGNOSIS — Z9484 Stem cells transplant status: Secondary | ICD-10-CM | POA: Diagnosis not present

## 2019-07-22 DIAGNOSIS — Z5111 Encounter for antineoplastic chemotherapy: Secondary | ICD-10-CM | POA: Diagnosis not present

## 2019-07-23 DIAGNOSIS — Z9484 Stem cells transplant status: Secondary | ICD-10-CM | POA: Diagnosis not present

## 2019-07-23 DIAGNOSIS — C9002 Multiple myeloma in relapse: Secondary | ICD-10-CM | POA: Diagnosis not present

## 2019-07-25 DIAGNOSIS — C9002 Multiple myeloma in relapse: Secondary | ICD-10-CM | POA: Diagnosis not present

## 2019-07-25 DIAGNOSIS — D801 Nonfamilial hypogammaglobulinemia: Secondary | ICD-10-CM | POA: Diagnosis not present

## 2019-07-25 DIAGNOSIS — C4491 Basal cell carcinoma of skin, unspecified: Secondary | ICD-10-CM | POA: Diagnosis not present

## 2019-07-25 DIAGNOSIS — Z09 Encounter for follow-up examination after completed treatment for conditions other than malignant neoplasm: Secondary | ICD-10-CM | POA: Diagnosis not present

## 2019-07-28 DIAGNOSIS — M4854XS Collapsed vertebra, not elsewhere classified, thoracic region, sequela of fracture: Secondary | ICD-10-CM | POA: Insufficient documentation

## 2019-07-29 DIAGNOSIS — C9000 Multiple myeloma not having achieved remission: Secondary | ICD-10-CM | POA: Diagnosis not present

## 2019-07-29 DIAGNOSIS — Z5111 Encounter for antineoplastic chemotherapy: Secondary | ICD-10-CM | POA: Diagnosis not present

## 2019-07-29 DIAGNOSIS — Z9484 Stem cells transplant status: Secondary | ICD-10-CM | POA: Diagnosis not present

## 2019-07-30 DIAGNOSIS — Z9484 Stem cells transplant status: Secondary | ICD-10-CM | POA: Diagnosis not present

## 2019-07-30 DIAGNOSIS — R1310 Dysphagia, unspecified: Secondary | ICD-10-CM | POA: Diagnosis not present

## 2019-07-30 DIAGNOSIS — Z5111 Encounter for antineoplastic chemotherapy: Secondary | ICD-10-CM | POA: Diagnosis not present

## 2019-07-30 DIAGNOSIS — C9000 Multiple myeloma not having achieved remission: Secondary | ICD-10-CM | POA: Diagnosis not present

## 2019-07-30 DIAGNOSIS — C9002 Multiple myeloma in relapse: Secondary | ICD-10-CM | POA: Diagnosis not present

## 2019-07-31 DIAGNOSIS — C9000 Multiple myeloma not having achieved remission: Secondary | ICD-10-CM | POA: Diagnosis not present

## 2019-07-31 DIAGNOSIS — D649 Anemia, unspecified: Secondary | ICD-10-CM | POA: Diagnosis not present

## 2019-07-31 DIAGNOSIS — Z6830 Body mass index (BMI) 30.0-30.9, adult: Secondary | ICD-10-CM | POA: Diagnosis not present

## 2019-07-31 DIAGNOSIS — G894 Chronic pain syndrome: Secondary | ICD-10-CM | POA: Diagnosis not present

## 2019-07-31 DIAGNOSIS — I10 Essential (primary) hypertension: Secondary | ICD-10-CM | POA: Diagnosis not present

## 2019-07-31 DIAGNOSIS — E6609 Other obesity due to excess calories: Secondary | ICD-10-CM | POA: Diagnosis not present

## 2019-07-31 DIAGNOSIS — Z9481 Bone marrow transplant status: Secondary | ICD-10-CM | POA: Diagnosis not present

## 2019-08-05 DIAGNOSIS — C9002 Multiple myeloma in relapse: Secondary | ICD-10-CM | POA: Diagnosis not present

## 2019-08-05 DIAGNOSIS — M4854XG Collapsed vertebra, not elsewhere classified, thoracic region, subsequent encounter for fracture with delayed healing: Secondary | ICD-10-CM | POA: Diagnosis not present

## 2019-08-05 DIAGNOSIS — S22000A Wedge compression fracture of unspecified thoracic vertebra, initial encounter for closed fracture: Secondary | ICD-10-CM | POA: Diagnosis not present

## 2019-08-08 DIAGNOSIS — K449 Diaphragmatic hernia without obstruction or gangrene: Secondary | ICD-10-CM | POA: Diagnosis not present

## 2019-08-08 DIAGNOSIS — C9002 Multiple myeloma in relapse: Secondary | ICD-10-CM | POA: Diagnosis not present

## 2019-08-08 DIAGNOSIS — C4491 Basal cell carcinoma of skin, unspecified: Secondary | ICD-10-CM | POA: Diagnosis not present

## 2019-08-08 DIAGNOSIS — D801 Nonfamilial hypogammaglobulinemia: Secondary | ICD-10-CM | POA: Diagnosis not present

## 2019-08-08 DIAGNOSIS — Z09 Encounter for follow-up examination after completed treatment for conditions other than malignant neoplasm: Secondary | ICD-10-CM | POA: Diagnosis not present

## 2019-08-11 IMAGING — CT NM PET TUM IMG INITIAL (PI) WHOLE BODY
7 series · 25 of 25 positions shown · non-contrast
Comparison: Bone survey 07/31/2017, CT chest 04/09/2015 and CT
abdomen pelvis 03/03/2014.

CLINICAL DATA: Initial treatment strategy for multiple myeloma.

EXAM:
NUCLEAR MEDICINE PET WHOLE BODY
TECHNIQUE: 10.3 mCi F-18 FDG was injected intravenously. Full-ring PET imaging
was performed from the skull base to thigh after the radiotracer. CT
data was obtained and used for attenuation correction and anatomic
localization.
Fasting blood glucose: 100 mg/dl

[Series 3: pet wb ac · axial · 5.0mm · 4.07mm/px · z∈[-12,+1884]mm · 5 of 475 slices shown]
[im 1/475]
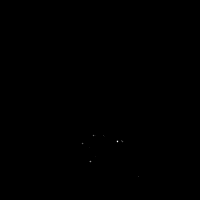
[im 119/475]
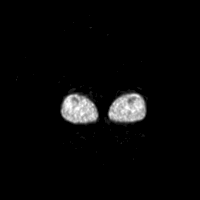
[im 238/475]
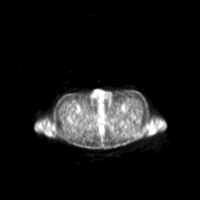
[im 356/475]
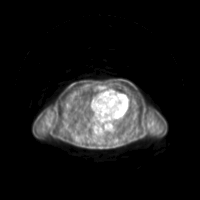
[im 475/475]
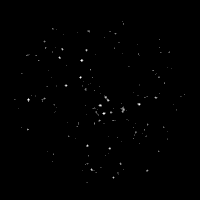

[Series 4: ct wb 5.0 hd_fov · axial · 5.0mm · 1.17mm/px · z∈[-12,+1884]mm · 5 of 475 slices shown]
[im 1/475]
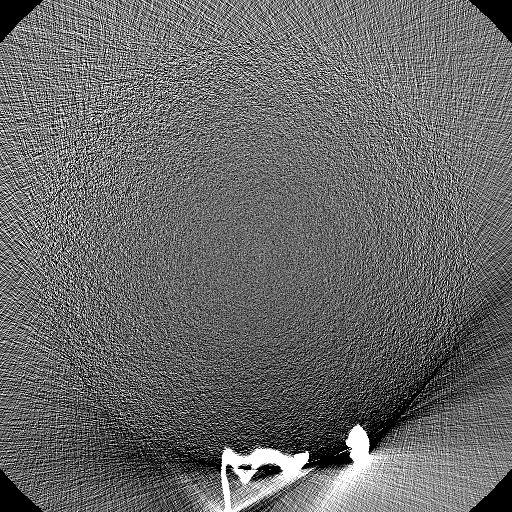
[im 119/475  soft-tissue]
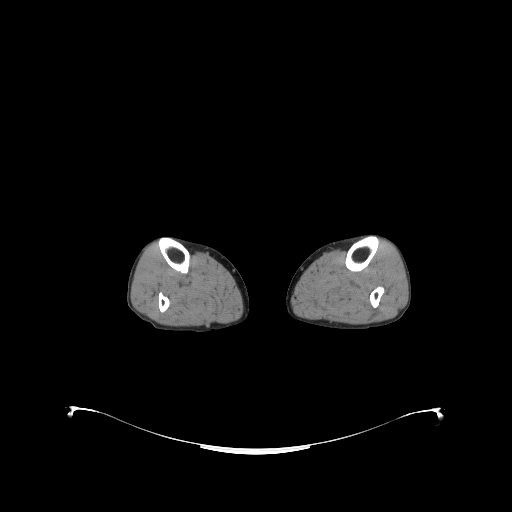
[im 238/475  soft-tissue]
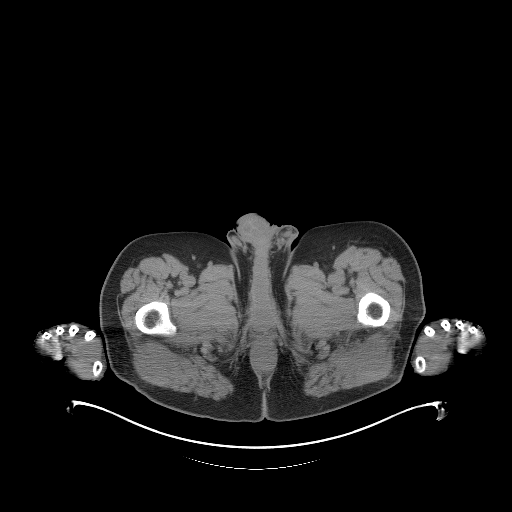
[im 356/475  soft-tissue]
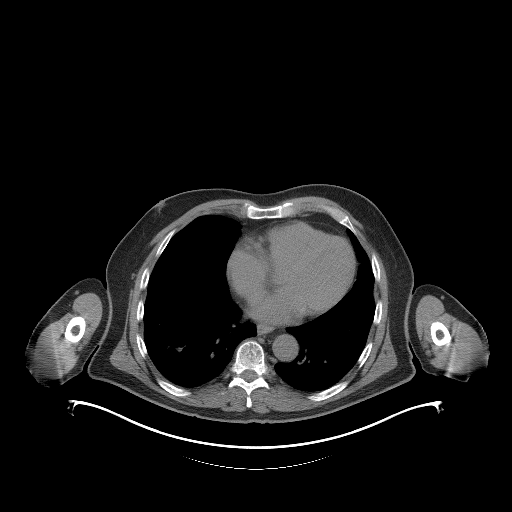
[im 475/475  soft-tissue]
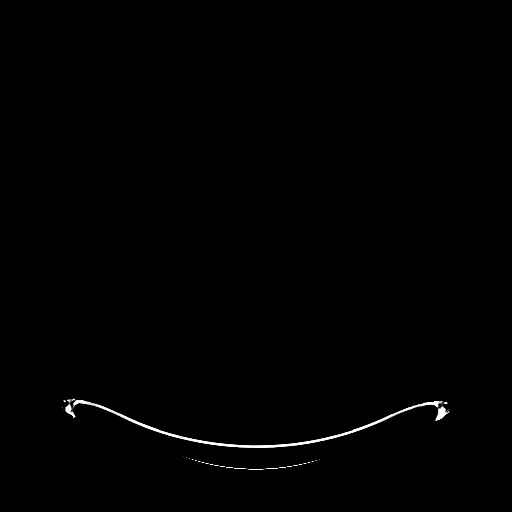

[Series 5: pet wb nac · axial · 5.0mm · 4.07mm/px · z∈[-8,+1884]mm · 6 of 474 slices shown]
[im 1/474]
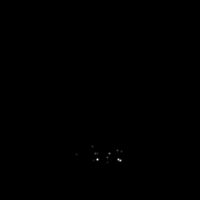
[im 95/474]
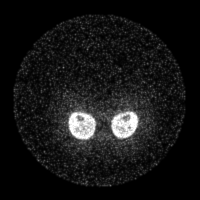
[im 190/474]
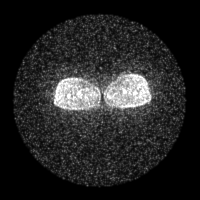
[im 284/474]
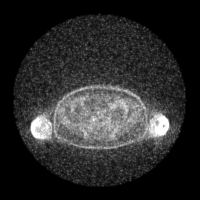
[im 379/474]
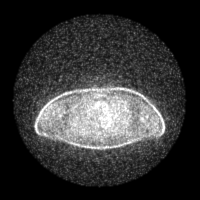
[im 474/474]
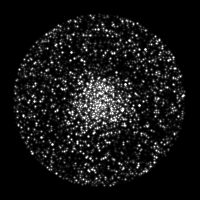

[Series 603: mip range 3 · coronal · 3.93mm/px · 1 of 32 slices shown]
[im 1/32]
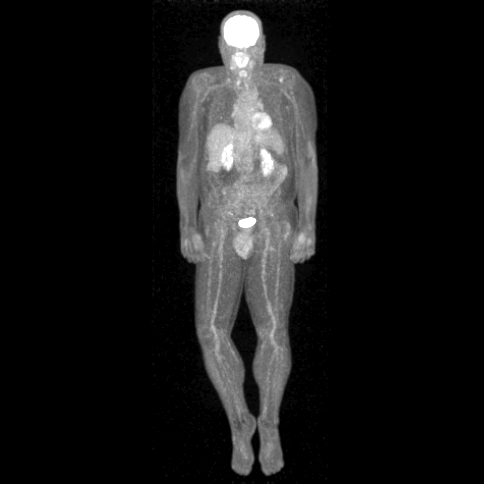

[Series 604: range-ct wb 5.0 hd_fov-cor-<alpha range> · 1 of 68 slices shown]
[im 1/68]
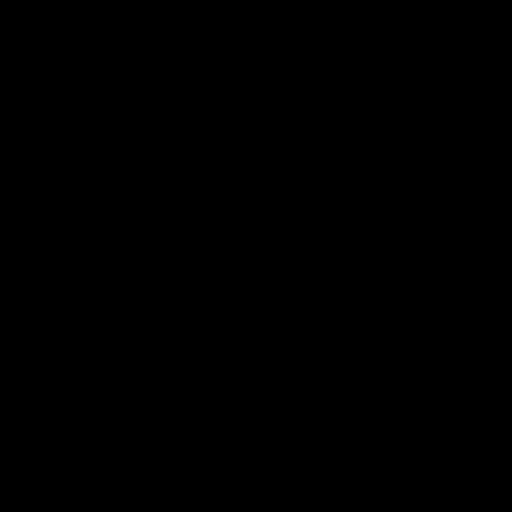

[Series 605: range-ct wb 5.0 hd_fov-tra-<alpha range> · 6 of 453 slices shown]
[im 1/453]
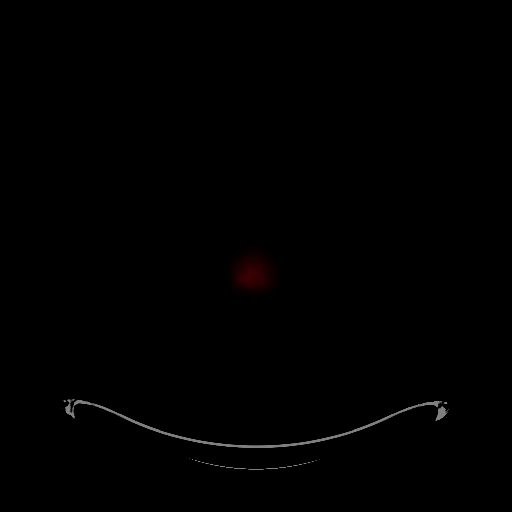
[im 91/453]
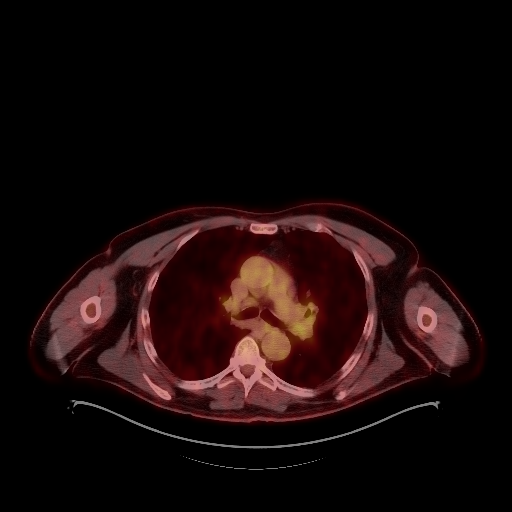
[im 181/453]
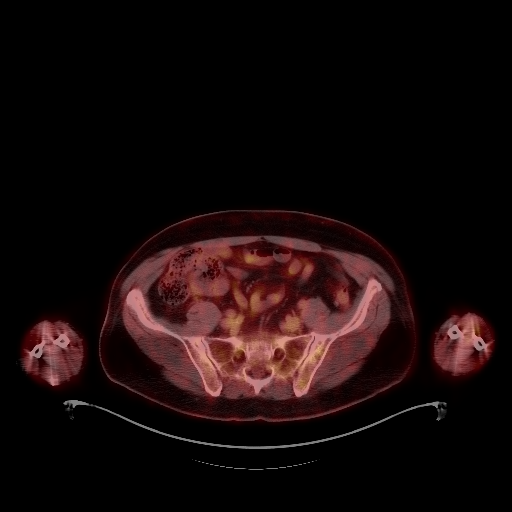
[im 272/453]
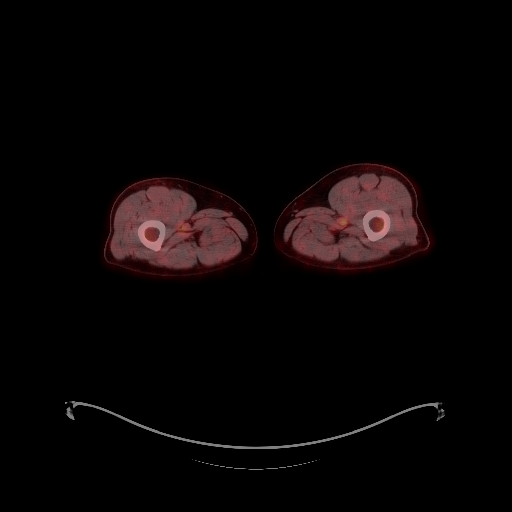
[im 362/453]
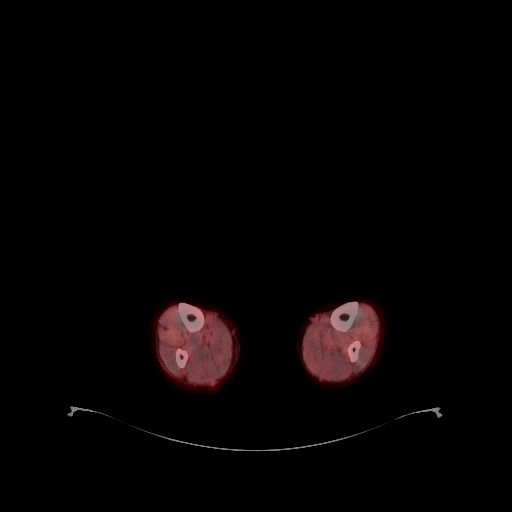
[im 453/453]
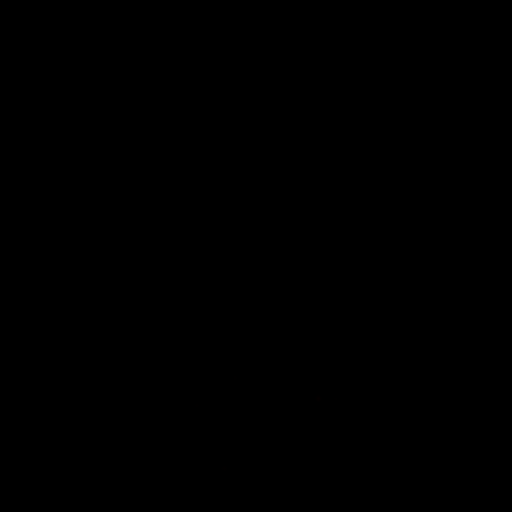

[Series 1056: results mm oncology reading · 1.2mm · 1.20mm/px · 1 of 3 slices shown]
[im 1/3]
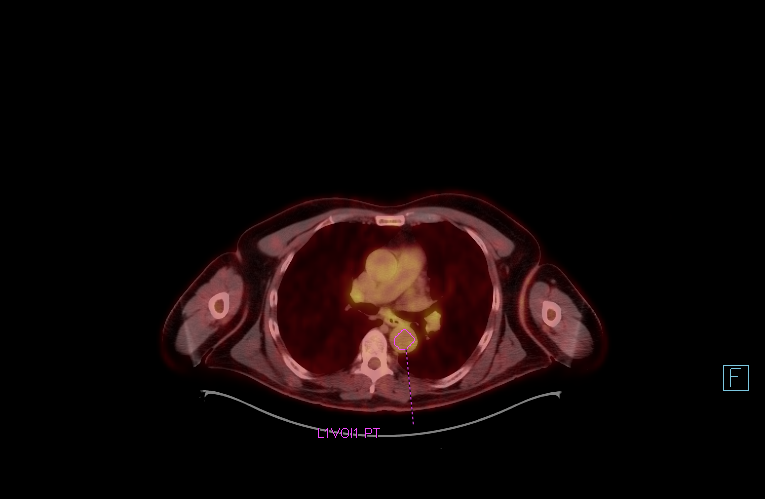

[25 of 25 positions shown; findings below may reference images not displayed]

FINDINGS: Mediastinal blood pool activity: SUV max

HEAD/NECK: No hypermetabolic lymph nodes in the neck.

Incidental CT findings: Air-fluid level in the left maxillary sinus.

CHEST: 9 mm right thyroid nodule has an SUV max of 4.8. AP window
lymph node measures 9 mm but does not show metabolism above blood
pool. No hypermetabolic mediastinal, hilar or axillary lymph nodes.
No hypermetabolic pulmonary nodules.

Incidental CT findings: Atherosclerotic calcification of the
arterial vasculature. Heart is enlarged. No pericardial or pleural
effusion. Centrilobular emphysema.

ABDOMEN/PELVIS: No abnormal hypermetabolism in the liver, adrenal
glands, spleen or pancreas. No hypermetabolic lymph nodes.

Incidental CT findings: Large stone is seen in the gallbladder.
Low-attenuation lesions in the kidneys are difficult to further
characterize without post-contrast imaging. Scattered calcifications
in the pancreas. No free fluid.

SKELETON: No abnormal osseous hypermetabolism.

Incidental CT findings: No worrisome lytic or sclerotic lesions.
Mild degenerative changes in the spine.

EXTREMITIES: No abnormal hypermetabolism.

Incidental CT findings: None.
IMPRESSION: 1. No abnormal hypermetabolism to indicate metastatic multiple
myeloma.
2. Mildly hypermetabolic right thyroid nodule, indeterminate.
Consider sonographic evaluation, as clinically indicated.
3.  Aortic atherosclerosis (WCKVS-170.0).
4.  Emphysema (WCKVS-ZO1.G).
5. Cholelithiasis.

## 2019-08-12 DIAGNOSIS — Z79899 Other long term (current) drug therapy: Secondary | ICD-10-CM | POA: Diagnosis not present

## 2019-08-12 DIAGNOSIS — Z9484 Stem cells transplant status: Secondary | ICD-10-CM | POA: Diagnosis not present

## 2019-08-12 DIAGNOSIS — Z5111 Encounter for antineoplastic chemotherapy: Secondary | ICD-10-CM | POA: Diagnosis not present

## 2019-08-12 DIAGNOSIS — C9000 Multiple myeloma not having achieved remission: Secondary | ICD-10-CM | POA: Diagnosis not present

## 2019-08-13 DIAGNOSIS — Z9484 Stem cells transplant status: Secondary | ICD-10-CM | POA: Diagnosis not present

## 2019-08-13 DIAGNOSIS — C9000 Multiple myeloma not having achieved remission: Secondary | ICD-10-CM | POA: Diagnosis not present

## 2019-08-13 DIAGNOSIS — Z5111 Encounter for antineoplastic chemotherapy: Secondary | ICD-10-CM | POA: Diagnosis not present

## 2019-08-15 DIAGNOSIS — C9002 Multiple myeloma in relapse: Secondary | ICD-10-CM | POA: Diagnosis not present

## 2019-08-15 DIAGNOSIS — Z7901 Long term (current) use of anticoagulants: Secondary | ICD-10-CM | POA: Diagnosis not present

## 2019-08-15 DIAGNOSIS — D801 Nonfamilial hypogammaglobulinemia: Secondary | ICD-10-CM | POA: Diagnosis not present

## 2019-08-15 DIAGNOSIS — I82411 Acute embolism and thrombosis of right femoral vein: Secondary | ICD-10-CM | POA: Diagnosis not present

## 2019-08-15 DIAGNOSIS — G893 Neoplasm related pain (acute) (chronic): Secondary | ICD-10-CM | POA: Diagnosis not present

## 2019-08-15 DIAGNOSIS — G909 Disorder of the autonomic nervous system, unspecified: Secondary | ICD-10-CM | POA: Diagnosis not present

## 2019-08-15 DIAGNOSIS — C4491 Basal cell carcinoma of skin, unspecified: Secondary | ICD-10-CM | POA: Diagnosis not present

## 2019-08-15 DIAGNOSIS — I2699 Other pulmonary embolism without acute cor pulmonale: Secondary | ICD-10-CM | POA: Diagnosis not present

## 2019-08-15 DIAGNOSIS — S22000S Wedge compression fracture of unspecified thoracic vertebra, sequela: Secondary | ICD-10-CM | POA: Diagnosis not present

## 2019-08-15 DIAGNOSIS — Z09 Encounter for follow-up examination after completed treatment for conditions other than malignant neoplasm: Secondary | ICD-10-CM | POA: Diagnosis not present

## 2019-08-19 DIAGNOSIS — Z7901 Long term (current) use of anticoagulants: Secondary | ICD-10-CM | POA: Insufficient documentation

## 2019-08-19 DIAGNOSIS — C9002 Multiple myeloma in relapse: Secondary | ICD-10-CM | POA: Diagnosis not present

## 2019-08-19 DIAGNOSIS — Z5112 Encounter for antineoplastic immunotherapy: Secondary | ICD-10-CM | POA: Diagnosis not present

## 2019-08-19 DIAGNOSIS — Z9484 Stem cells transplant status: Secondary | ICD-10-CM | POA: Diagnosis not present

## 2019-08-20 DIAGNOSIS — Z5111 Encounter for antineoplastic chemotherapy: Secondary | ICD-10-CM | POA: Diagnosis not present

## 2019-08-20 DIAGNOSIS — Z9484 Stem cells transplant status: Secondary | ICD-10-CM | POA: Diagnosis not present

## 2019-08-20 DIAGNOSIS — C9000 Multiple myeloma not having achieved remission: Secondary | ICD-10-CM | POA: Diagnosis not present

## 2019-08-22 DIAGNOSIS — Z9484 Stem cells transplant status: Secondary | ICD-10-CM | POA: Diagnosis not present

## 2019-08-22 DIAGNOSIS — C9000 Multiple myeloma not having achieved remission: Secondary | ICD-10-CM | POA: Diagnosis not present

## 2019-08-27 DIAGNOSIS — C9002 Multiple myeloma in relapse: Secondary | ICD-10-CM | POA: Diagnosis not present

## 2019-08-27 DIAGNOSIS — Z9484 Stem cells transplant status: Secondary | ICD-10-CM | POA: Diagnosis not present

## 2019-08-28 DIAGNOSIS — C9000 Multiple myeloma not having achieved remission: Secondary | ICD-10-CM | POA: Diagnosis not present

## 2019-08-28 DIAGNOSIS — Z5111 Encounter for antineoplastic chemotherapy: Secondary | ICD-10-CM | POA: Diagnosis not present

## 2019-08-28 DIAGNOSIS — Z9484 Stem cells transplant status: Secondary | ICD-10-CM | POA: Diagnosis not present

## 2019-08-29 DIAGNOSIS — Z09 Encounter for follow-up examination after completed treatment for conditions other than malignant neoplasm: Secondary | ICD-10-CM | POA: Diagnosis not present

## 2019-08-29 DIAGNOSIS — Z9484 Stem cells transplant status: Secondary | ICD-10-CM | POA: Diagnosis not present

## 2019-08-29 DIAGNOSIS — C9002 Multiple myeloma in relapse: Secondary | ICD-10-CM | POA: Diagnosis not present

## 2019-09-05 DIAGNOSIS — Z7901 Long term (current) use of anticoagulants: Secondary | ICD-10-CM | POA: Diagnosis not present

## 2019-09-05 DIAGNOSIS — G893 Neoplasm related pain (acute) (chronic): Secondary | ICD-10-CM | POA: Diagnosis not present

## 2019-09-05 DIAGNOSIS — C9000 Multiple myeloma not having achieved remission: Secondary | ICD-10-CM | POA: Diagnosis not present

## 2019-09-05 DIAGNOSIS — S22000S Wedge compression fracture of unspecified thoracic vertebra, sequela: Secondary | ICD-10-CM | POA: Diagnosis not present

## 2019-09-05 DIAGNOSIS — C9002 Multiple myeloma in relapse: Secondary | ICD-10-CM | POA: Diagnosis not present

## 2019-09-05 DIAGNOSIS — D801 Nonfamilial hypogammaglobulinemia: Secondary | ICD-10-CM | POA: Diagnosis not present

## 2019-09-05 DIAGNOSIS — Z9484 Stem cells transplant status: Secondary | ICD-10-CM | POA: Diagnosis not present

## 2019-09-05 DIAGNOSIS — Z09 Encounter for follow-up examination after completed treatment for conditions other than malignant neoplasm: Secondary | ICD-10-CM | POA: Diagnosis not present

## 2019-09-10 DIAGNOSIS — Z9484 Stem cells transplant status: Secondary | ICD-10-CM | POA: Diagnosis not present

## 2019-09-10 DIAGNOSIS — C9000 Multiple myeloma not having achieved remission: Secondary | ICD-10-CM | POA: Diagnosis not present

## 2019-09-10 DIAGNOSIS — Z5111 Encounter for antineoplastic chemotherapy: Secondary | ICD-10-CM | POA: Diagnosis not present

## 2019-09-11 DIAGNOSIS — Z9484 Stem cells transplant status: Secondary | ICD-10-CM | POA: Diagnosis not present

## 2019-09-11 DIAGNOSIS — C9002 Multiple myeloma in relapse: Secondary | ICD-10-CM | POA: Diagnosis not present

## 2019-09-12 DIAGNOSIS — D801 Nonfamilial hypogammaglobulinemia: Secondary | ICD-10-CM | POA: Diagnosis not present

## 2019-09-12 DIAGNOSIS — C9002 Multiple myeloma in relapse: Secondary | ICD-10-CM | POA: Diagnosis not present

## 2019-09-12 DIAGNOSIS — C4491 Basal cell carcinoma of skin, unspecified: Secondary | ICD-10-CM | POA: Diagnosis not present

## 2019-09-12 DIAGNOSIS — Z9484 Stem cells transplant status: Secondary | ICD-10-CM | POA: Diagnosis not present

## 2019-09-17 DIAGNOSIS — Z5111 Encounter for antineoplastic chemotherapy: Secondary | ICD-10-CM | POA: Diagnosis not present

## 2019-09-17 DIAGNOSIS — Z9484 Stem cells transplant status: Secondary | ICD-10-CM | POA: Diagnosis not present

## 2019-09-17 DIAGNOSIS — C9000 Multiple myeloma not having achieved remission: Secondary | ICD-10-CM | POA: Diagnosis not present

## 2019-09-18 DIAGNOSIS — Z9484 Stem cells transplant status: Secondary | ICD-10-CM | POA: Diagnosis not present

## 2019-09-18 DIAGNOSIS — Z5111 Encounter for antineoplastic chemotherapy: Secondary | ICD-10-CM | POA: Diagnosis not present

## 2019-09-18 DIAGNOSIS — C9000 Multiple myeloma not having achieved remission: Secondary | ICD-10-CM | POA: Diagnosis not present

## 2019-09-22 DIAGNOSIS — I2699 Other pulmonary embolism without acute cor pulmonale: Secondary | ICD-10-CM | POA: Diagnosis not present

## 2019-09-22 DIAGNOSIS — D801 Nonfamilial hypogammaglobulinemia: Secondary | ICD-10-CM | POA: Diagnosis not present

## 2019-09-22 DIAGNOSIS — Z09 Encounter for follow-up examination after completed treatment for conditions other than malignant neoplasm: Secondary | ICD-10-CM | POA: Diagnosis not present

## 2019-09-22 DIAGNOSIS — C9002 Multiple myeloma in relapse: Secondary | ICD-10-CM | POA: Diagnosis not present

## 2019-09-22 DIAGNOSIS — C4491 Basal cell carcinoma of skin, unspecified: Secondary | ICD-10-CM | POA: Diagnosis not present

## 2019-09-24 DIAGNOSIS — Z5111 Encounter for antineoplastic chemotherapy: Secondary | ICD-10-CM | POA: Diagnosis not present

## 2019-09-25 DIAGNOSIS — Z9484 Stem cells transplant status: Secondary | ICD-10-CM | POA: Diagnosis not present

## 2019-09-25 DIAGNOSIS — Z79899 Other long term (current) drug therapy: Secondary | ICD-10-CM | POA: Diagnosis not present

## 2019-09-25 DIAGNOSIS — C9000 Multiple myeloma not having achieved remission: Secondary | ICD-10-CM | POA: Diagnosis not present

## 2019-09-25 DIAGNOSIS — Z5111 Encounter for antineoplastic chemotherapy: Secondary | ICD-10-CM | POA: Diagnosis not present

## 2019-10-01 DIAGNOSIS — G620 Drug-induced polyneuropathy: Secondary | ICD-10-CM | POA: Diagnosis not present

## 2019-10-01 DIAGNOSIS — Z7901 Long term (current) use of anticoagulants: Secondary | ICD-10-CM | POA: Diagnosis not present

## 2019-10-01 DIAGNOSIS — Z79899 Other long term (current) drug therapy: Secondary | ICD-10-CM | POA: Diagnosis not present

## 2019-10-01 DIAGNOSIS — Z880 Allergy status to penicillin: Secondary | ICD-10-CM | POA: Diagnosis not present

## 2019-10-01 DIAGNOSIS — E785 Hyperlipidemia, unspecified: Secondary | ICD-10-CM | POA: Diagnosis not present

## 2019-10-01 DIAGNOSIS — N183 Chronic kidney disease, stage 3 unspecified: Secondary | ICD-10-CM | POA: Diagnosis not present

## 2019-10-01 DIAGNOSIS — T451X5A Adverse effect of antineoplastic and immunosuppressive drugs, initial encounter: Secondary | ICD-10-CM | POA: Diagnosis not present

## 2019-10-01 DIAGNOSIS — M4854XD Collapsed vertebra, not elsewhere classified, thoracic region, subsequent encounter for fracture with routine healing: Secondary | ICD-10-CM | POA: Diagnosis not present

## 2019-10-01 DIAGNOSIS — M5384 Other specified dorsopathies, thoracic region: Secondary | ICD-10-CM | POA: Diagnosis not present

## 2019-10-01 DIAGNOSIS — Z79891 Long term (current) use of opiate analgesic: Secondary | ICD-10-CM | POA: Diagnosis not present

## 2019-10-01 DIAGNOSIS — M4854XG Collapsed vertebra, not elsewhere classified, thoracic region, subsequent encounter for fracture with delayed healing: Secondary | ICD-10-CM | POA: Diagnosis not present

## 2019-10-01 DIAGNOSIS — E781 Pure hyperglyceridemia: Secondary | ICD-10-CM | POA: Diagnosis not present

## 2019-10-01 DIAGNOSIS — C9000 Multiple myeloma not having achieved remission: Secondary | ICD-10-CM | POA: Diagnosis not present

## 2019-10-01 DIAGNOSIS — I129 Hypertensive chronic kidney disease with stage 1 through stage 4 chronic kidney disease, or unspecified chronic kidney disease: Secondary | ICD-10-CM | POA: Diagnosis not present

## 2019-10-01 DIAGNOSIS — R0683 Snoring: Secondary | ICD-10-CM | POA: Diagnosis not present

## 2019-10-14 DIAGNOSIS — S22000A Wedge compression fracture of unspecified thoracic vertebra, initial encounter for closed fracture: Secondary | ICD-10-CM | POA: Diagnosis not present

## 2019-10-14 DIAGNOSIS — C9002 Multiple myeloma in relapse: Secondary | ICD-10-CM | POA: Diagnosis not present

## 2019-10-24 DIAGNOSIS — N289 Disorder of kidney and ureter, unspecified: Secondary | ICD-10-CM | POA: Diagnosis not present

## 2019-10-24 DIAGNOSIS — Z9221 Personal history of antineoplastic chemotherapy: Secondary | ICD-10-CM | POA: Diagnosis not present

## 2019-10-24 DIAGNOSIS — R609 Edema, unspecified: Secondary | ICD-10-CM | POA: Diagnosis not present

## 2019-10-24 DIAGNOSIS — K5792 Diverticulitis of intestine, part unspecified, without perforation or abscess without bleeding: Secondary | ICD-10-CM | POA: Diagnosis not present

## 2019-10-24 DIAGNOSIS — S22000S Wedge compression fracture of unspecified thoracic vertebra, sequela: Secondary | ICD-10-CM | POA: Diagnosis not present

## 2019-10-24 DIAGNOSIS — C9002 Multiple myeloma in relapse: Secondary | ICD-10-CM | POA: Diagnosis not present

## 2019-10-24 DIAGNOSIS — R0683 Snoring: Secondary | ICD-10-CM | POA: Diagnosis not present

## 2019-10-24 DIAGNOSIS — R1310 Dysphagia, unspecified: Secondary | ICD-10-CM | POA: Diagnosis not present

## 2019-10-24 DIAGNOSIS — G909 Disorder of the autonomic nervous system, unspecified: Secondary | ICD-10-CM | POA: Diagnosis not present

## 2019-10-24 DIAGNOSIS — D649 Anemia, unspecified: Secondary | ICD-10-CM | POA: Diagnosis not present

## 2019-10-24 DIAGNOSIS — C9000 Multiple myeloma not having achieved remission: Secondary | ICD-10-CM | POA: Diagnosis not present

## 2019-10-24 DIAGNOSIS — I959 Hypotension, unspecified: Secondary | ICD-10-CM | POA: Diagnosis not present

## 2019-10-24 DIAGNOSIS — Z7901 Long term (current) use of anticoagulants: Secondary | ICD-10-CM | POA: Diagnosis not present

## 2019-10-24 DIAGNOSIS — I517 Cardiomegaly: Secondary | ICD-10-CM | POA: Diagnosis not present

## 2019-10-24 DIAGNOSIS — H9319 Tinnitus, unspecified ear: Secondary | ICD-10-CM | POA: Diagnosis not present

## 2019-10-24 DIAGNOSIS — G893 Neoplasm related pain (acute) (chronic): Secondary | ICD-10-CM | POA: Diagnosis not present

## 2019-10-24 DIAGNOSIS — I10 Essential (primary) hypertension: Secondary | ICD-10-CM | POA: Diagnosis not present

## 2019-10-24 DIAGNOSIS — I2699 Other pulmonary embolism without acute cor pulmonale: Secondary | ICD-10-CM | POA: Diagnosis not present

## 2019-10-24 DIAGNOSIS — G629 Polyneuropathy, unspecified: Secondary | ICD-10-CM | POA: Diagnosis not present

## 2019-10-24 DIAGNOSIS — E785 Hyperlipidemia, unspecified: Secondary | ICD-10-CM | POA: Diagnosis not present

## 2019-10-24 DIAGNOSIS — Z23 Encounter for immunization: Secondary | ICD-10-CM | POA: Diagnosis not present

## 2019-10-24 DIAGNOSIS — T451X5A Adverse effect of antineoplastic and immunosuppressive drugs, initial encounter: Secondary | ICD-10-CM | POA: Diagnosis not present

## 2019-10-24 DIAGNOSIS — J302 Other seasonal allergic rhinitis: Secondary | ICD-10-CM | POA: Diagnosis not present

## 2019-10-24 DIAGNOSIS — M4855XA Collapsed vertebra, not elsewhere classified, thoracolumbar region, initial encounter for fracture: Secondary | ICD-10-CM | POA: Diagnosis not present

## 2019-11-26 DIAGNOSIS — D801 Nonfamilial hypogammaglobulinemia: Secondary | ICD-10-CM | POA: Diagnosis not present

## 2019-11-26 DIAGNOSIS — R1032 Left lower quadrant pain: Secondary | ICD-10-CM | POA: Diagnosis not present

## 2019-11-26 DIAGNOSIS — Z9484 Stem cells transplant status: Secondary | ICD-10-CM | POA: Diagnosis not present

## 2019-11-26 DIAGNOSIS — Z6829 Body mass index (BMI) 29.0-29.9, adult: Secondary | ICD-10-CM | POA: Diagnosis not present

## 2019-11-26 DIAGNOSIS — R7303 Prediabetes: Secondary | ICD-10-CM | POA: Diagnosis not present

## 2019-11-26 DIAGNOSIS — E876 Hypokalemia: Secondary | ICD-10-CM | POA: Diagnosis not present

## 2019-11-26 DIAGNOSIS — Z0000 Encounter for general adult medical examination without abnormal findings: Secondary | ICD-10-CM | POA: Diagnosis not present

## 2019-11-26 DIAGNOSIS — I10 Essential (primary) hypertension: Secondary | ICD-10-CM | POA: Diagnosis not present

## 2019-11-26 DIAGNOSIS — R110 Nausea: Secondary | ICD-10-CM | POA: Diagnosis not present

## 2019-11-26 DIAGNOSIS — Z9481 Bone marrow transplant status: Secondary | ICD-10-CM | POA: Diagnosis not present

## 2019-11-26 DIAGNOSIS — R10814 Left lower quadrant abdominal tenderness: Secondary | ICD-10-CM | POA: Diagnosis not present

## 2019-11-26 DIAGNOSIS — G893 Neoplasm related pain (acute) (chronic): Secondary | ICD-10-CM | POA: Diagnosis not present

## 2019-11-26 DIAGNOSIS — G894 Chronic pain syndrome: Secondary | ICD-10-CM | POA: Diagnosis not present

## 2019-11-26 DIAGNOSIS — C4491 Basal cell carcinoma of skin, unspecified: Secondary | ICD-10-CM | POA: Diagnosis not present

## 2019-11-26 DIAGNOSIS — Z09 Encounter for follow-up examination after completed treatment for conditions other than malignant neoplasm: Secondary | ICD-10-CM | POA: Diagnosis not present

## 2019-11-26 DIAGNOSIS — C9000 Multiple myeloma not having achieved remission: Secondary | ICD-10-CM | POA: Diagnosis not present

## 2019-11-26 DIAGNOSIS — R531 Weakness: Secondary | ICD-10-CM | POA: Diagnosis not present

## 2019-12-11 ENCOUNTER — Ambulatory Visit: Payer: PPO | Attending: Internal Medicine

## 2019-12-11 DIAGNOSIS — Z23 Encounter for immunization: Secondary | ICD-10-CM

## 2019-12-11 NOTE — Progress Notes (Signed)
   Covid-19 Vaccination Clinic  Name:  SEGER JANI    MRN: [id number] DOB: 1955/04/06  12/11/2019  Mr. Signorelli was observed post Covid-19 immunization for 15 minutes without incident. He was provided with Vaccine Information Sheet and instruction to access the V-Safe system.   Mr. Comer was instructed to call 911 with any severe reactions post vaccine: Marland Kitchen Difficulty breathing  . Swelling of face and throat  . A fast heartbeat  . A bad rash all over body  . Dizziness and weakness

## 2020-01-05 DIAGNOSIS — C9000 Multiple myeloma not having achieved remission: Secondary | ICD-10-CM | POA: Diagnosis not present

## 2020-01-05 DIAGNOSIS — Z9484 Stem cells transplant status: Secondary | ICD-10-CM | POA: Diagnosis not present

## 2020-01-05 DIAGNOSIS — G893 Neoplasm related pain (acute) (chronic): Secondary | ICD-10-CM | POA: Diagnosis not present

## 2020-01-05 DIAGNOSIS — R5383 Other fatigue: Secondary | ICD-10-CM | POA: Diagnosis not present

## 2020-03-02 DIAGNOSIS — E876 Hypokalemia: Secondary | ICD-10-CM | POA: Diagnosis not present

## 2020-03-02 DIAGNOSIS — G909 Disorder of the autonomic nervous system, unspecified: Secondary | ICD-10-CM | POA: Diagnosis not present

## 2020-03-02 DIAGNOSIS — C9000 Multiple myeloma not having achieved remission: Secondary | ICD-10-CM | POA: Diagnosis not present

## 2020-03-02 DIAGNOSIS — D801 Nonfamilial hypogammaglobulinemia: Secondary | ICD-10-CM | POA: Diagnosis not present

## 2020-03-02 DIAGNOSIS — Z09 Encounter for follow-up examination after completed treatment for conditions other than malignant neoplasm: Secondary | ICD-10-CM | POA: Diagnosis not present

## 2020-03-02 DIAGNOSIS — N183 Chronic kidney disease, stage 3 unspecified: Secondary | ICD-10-CM | POA: Diagnosis not present

## 2020-03-02 DIAGNOSIS — Z9484 Stem cells transplant status: Secondary | ICD-10-CM | POA: Diagnosis not present

## 2020-03-04 DIAGNOSIS — Z9484 Stem cells transplant status: Secondary | ICD-10-CM | POA: Diagnosis not present

## 2020-03-04 DIAGNOSIS — C9000 Multiple myeloma not having achieved remission: Secondary | ICD-10-CM | POA: Diagnosis not present

## 2020-03-04 DIAGNOSIS — G893 Neoplasm related pain (acute) (chronic): Secondary | ICD-10-CM | POA: Diagnosis not present

## 2020-03-04 DIAGNOSIS — I82411 Acute embolism and thrombosis of right femoral vein: Secondary | ICD-10-CM | POA: Diagnosis not present

## 2020-03-04 DIAGNOSIS — Z9221 Personal history of antineoplastic chemotherapy: Secondary | ICD-10-CM | POA: Diagnosis not present

## 2020-03-06 DIAGNOSIS — G894 Chronic pain syndrome: Secondary | ICD-10-CM | POA: Diagnosis not present

## 2020-03-06 DIAGNOSIS — I10 Essential (primary) hypertension: Secondary | ICD-10-CM | POA: Diagnosis not present

## 2020-03-16 DIAGNOSIS — G894 Chronic pain syndrome: Secondary | ICD-10-CM | POA: Diagnosis not present

## 2020-03-16 DIAGNOSIS — R10814 Left lower quadrant abdominal tenderness: Secondary | ICD-10-CM | POA: Diagnosis not present

## 2020-03-16 DIAGNOSIS — R051 Acute cough: Secondary | ICD-10-CM | POA: Diagnosis not present

## 2020-03-16 DIAGNOSIS — I10 Essential (primary) hypertension: Secondary | ICD-10-CM | POA: Diagnosis not present

## 2020-03-16 DIAGNOSIS — Z9481 Bone marrow transplant status: Secondary | ICD-10-CM | POA: Diagnosis not present

## 2020-03-16 DIAGNOSIS — R1032 Left lower quadrant pain: Secondary | ICD-10-CM | POA: Diagnosis not present

## 2020-03-16 DIAGNOSIS — Z20822 Contact with and (suspected) exposure to covid-19: Secondary | ICD-10-CM | POA: Diagnosis not present

## 2020-03-16 DIAGNOSIS — C9000 Multiple myeloma not having achieved remission: Secondary | ICD-10-CM | POA: Diagnosis not present

## 2020-03-16 DIAGNOSIS — J069 Acute upper respiratory infection, unspecified: Secondary | ICD-10-CM | POA: Diagnosis not present

## 2020-03-16 DIAGNOSIS — Z6829 Body mass index (BMI) 29.0-29.9, adult: Secondary | ICD-10-CM | POA: Diagnosis not present

## 2020-03-16 DIAGNOSIS — Z0000 Encounter for general adult medical examination without abnormal findings: Secondary | ICD-10-CM | POA: Diagnosis not present

## 2020-03-22 DIAGNOSIS — Z20822 Contact with and (suspected) exposure to covid-19: Secondary | ICD-10-CM | POA: Diagnosis not present

## 2020-03-22 DIAGNOSIS — R051 Acute cough: Secondary | ICD-10-CM | POA: Diagnosis not present

## 2020-03-22 DIAGNOSIS — J069 Acute upper respiratory infection, unspecified: Secondary | ICD-10-CM | POA: Diagnosis not present

## 2020-03-26 DIAGNOSIS — J069 Acute upper respiratory infection, unspecified: Secondary | ICD-10-CM | POA: Diagnosis not present

## 2020-03-26 DIAGNOSIS — Z20822 Contact with and (suspected) exposure to covid-19: Secondary | ICD-10-CM | POA: Diagnosis not present

## 2020-03-26 DIAGNOSIS — R051 Acute cough: Secondary | ICD-10-CM | POA: Diagnosis not present

## 2020-04-27 DIAGNOSIS — D801 Nonfamilial hypogammaglobulinemia: Secondary | ICD-10-CM | POA: Diagnosis not present

## 2020-04-27 DIAGNOSIS — I82411 Acute embolism and thrombosis of right femoral vein: Secondary | ICD-10-CM | POA: Diagnosis not present

## 2020-04-27 DIAGNOSIS — C9000 Multiple myeloma not having achieved remission: Secondary | ICD-10-CM | POA: Diagnosis not present

## 2020-04-27 DIAGNOSIS — Z09 Encounter for follow-up examination after completed treatment for conditions other than malignant neoplasm: Secondary | ICD-10-CM | POA: Diagnosis not present

## 2020-04-27 DIAGNOSIS — Z9484 Stem cells transplant status: Secondary | ICD-10-CM | POA: Diagnosis not present

## 2020-05-03 DIAGNOSIS — Z7189 Other specified counseling: Secondary | ICD-10-CM | POA: Diagnosis not present

## 2020-05-03 DIAGNOSIS — Z9484 Stem cells transplant status: Secondary | ICD-10-CM | POA: Diagnosis not present

## 2020-05-03 DIAGNOSIS — G893 Neoplasm related pain (acute) (chronic): Secondary | ICD-10-CM | POA: Diagnosis not present

## 2020-05-03 DIAGNOSIS — Z9221 Personal history of antineoplastic chemotherapy: Secondary | ICD-10-CM | POA: Diagnosis not present

## 2020-05-03 DIAGNOSIS — C9000 Multiple myeloma not having achieved remission: Secondary | ICD-10-CM | POA: Diagnosis not present

## 2020-05-03 DIAGNOSIS — D801 Nonfamilial hypogammaglobulinemia: Secondary | ICD-10-CM | POA: Diagnosis not present

## 2020-05-12 DIAGNOSIS — Z7189 Other specified counseling: Secondary | ICD-10-CM | POA: Insufficient documentation

## 2020-06-06 DIAGNOSIS — I10 Essential (primary) hypertension: Secondary | ICD-10-CM | POA: Diagnosis not present

## 2020-06-06 DIAGNOSIS — K219 Gastro-esophageal reflux disease without esophagitis: Secondary | ICD-10-CM | POA: Diagnosis not present

## 2020-07-01 DIAGNOSIS — T451X5A Adverse effect of antineoplastic and immunosuppressive drugs, initial encounter: Secondary | ICD-10-CM | POA: Diagnosis not present

## 2020-07-01 DIAGNOSIS — G620 Drug-induced polyneuropathy: Secondary | ICD-10-CM | POA: Diagnosis not present

## 2020-07-01 DIAGNOSIS — C9000 Multiple myeloma not having achieved remission: Secondary | ICD-10-CM | POA: Diagnosis not present

## 2020-07-01 DIAGNOSIS — D801 Nonfamilial hypogammaglobulinemia: Secondary | ICD-10-CM | POA: Diagnosis not present

## 2020-07-06 DIAGNOSIS — C9000 Multiple myeloma not having achieved remission: Secondary | ICD-10-CM | POA: Diagnosis not present

## 2020-07-19 DIAGNOSIS — J449 Chronic obstructive pulmonary disease, unspecified: Secondary | ICD-10-CM | POA: Diagnosis not present

## 2020-07-19 DIAGNOSIS — J439 Emphysema, unspecified: Secondary | ICD-10-CM | POA: Diagnosis not present

## 2020-07-19 DIAGNOSIS — C9000 Multiple myeloma not having achieved remission: Secondary | ICD-10-CM | POA: Diagnosis not present

## 2020-07-19 DIAGNOSIS — R0989 Other specified symptoms and signs involving the circulatory and respiratory systems: Secondary | ICD-10-CM | POA: Diagnosis not present

## 2020-08-18 DIAGNOSIS — I82411 Acute embolism and thrombosis of right femoral vein: Secondary | ICD-10-CM | POA: Diagnosis not present

## 2020-08-18 DIAGNOSIS — D801 Nonfamilial hypogammaglobulinemia: Secondary | ICD-10-CM | POA: Diagnosis not present

## 2020-08-18 DIAGNOSIS — Z09 Encounter for follow-up examination after completed treatment for conditions other than malignant neoplasm: Secondary | ICD-10-CM | POA: Diagnosis not present

## 2020-08-18 DIAGNOSIS — C9000 Multiple myeloma not having achieved remission: Secondary | ICD-10-CM | POA: Diagnosis not present

## 2020-09-08 DIAGNOSIS — D801 Nonfamilial hypogammaglobulinemia: Secondary | ICD-10-CM | POA: Diagnosis not present

## 2020-09-08 DIAGNOSIS — C9000 Multiple myeloma not having achieved remission: Secondary | ICD-10-CM | POA: Diagnosis not present

## 2020-09-08 DIAGNOSIS — Z9484 Stem cells transplant status: Secondary | ICD-10-CM | POA: Diagnosis not present

## 2020-09-08 DIAGNOSIS — I82411 Acute embolism and thrombosis of right femoral vein: Secondary | ICD-10-CM | POA: Diagnosis not present

## 2020-09-08 DIAGNOSIS — Z09 Encounter for follow-up examination after completed treatment for conditions other than malignant neoplasm: Secondary | ICD-10-CM | POA: Diagnosis not present

## 2020-09-16 DIAGNOSIS — C9000 Multiple myeloma not having achieved remission: Secondary | ICD-10-CM | POA: Diagnosis not present

## 2020-11-10 DIAGNOSIS — C9000 Multiple myeloma not having achieved remission: Secondary | ICD-10-CM | POA: Diagnosis not present

## 2020-11-10 DIAGNOSIS — Z09 Encounter for follow-up examination after completed treatment for conditions other than malignant neoplasm: Secondary | ICD-10-CM | POA: Diagnosis not present

## 2020-11-10 DIAGNOSIS — I82411 Acute embolism and thrombosis of right femoral vein: Secondary | ICD-10-CM | POA: Diagnosis not present

## 2020-11-10 DIAGNOSIS — D801 Nonfamilial hypogammaglobulinemia: Secondary | ICD-10-CM | POA: Diagnosis not present

## 2020-11-10 DIAGNOSIS — Z9484 Stem cells transplant status: Secondary | ICD-10-CM | POA: Diagnosis not present

## 2020-11-17 DIAGNOSIS — C9000 Multiple myeloma not having achieved remission: Secondary | ICD-10-CM | POA: Diagnosis not present

## 2020-11-24 DIAGNOSIS — C9000 Multiple myeloma not having achieved remission: Secondary | ICD-10-CM | POA: Diagnosis not present

## 2020-12-08 DIAGNOSIS — C9000 Multiple myeloma not having achieved remission: Secondary | ICD-10-CM | POA: Diagnosis not present

## 2020-12-13 DIAGNOSIS — J069 Acute upper respiratory infection, unspecified: Secondary | ICD-10-CM | POA: Insufficient documentation

## 2020-12-13 DIAGNOSIS — R059 Cough, unspecified: Secondary | ICD-10-CM | POA: Diagnosis not present

## 2020-12-13 DIAGNOSIS — R058 Other specified cough: Secondary | ICD-10-CM | POA: Insufficient documentation

## 2020-12-15 DIAGNOSIS — C9000 Multiple myeloma not having achieved remission: Secondary | ICD-10-CM | POA: Diagnosis not present

## 2020-12-16 DIAGNOSIS — M419 Scoliosis, unspecified: Secondary | ICD-10-CM | POA: Diagnosis not present

## 2020-12-16 DIAGNOSIS — C9000 Multiple myeloma not having achieved remission: Secondary | ICD-10-CM | POA: Diagnosis not present

## 2020-12-16 DIAGNOSIS — M549 Dorsalgia, unspecified: Secondary | ICD-10-CM | POA: Diagnosis not present

## 2020-12-16 DIAGNOSIS — N281 Cyst of kidney, acquired: Secondary | ICD-10-CM | POA: Diagnosis not present

## 2020-12-16 DIAGNOSIS — Z9889 Other specified postprocedural states: Secondary | ICD-10-CM | POA: Diagnosis not present

## 2021-02-14 DIAGNOSIS — G620 Drug-induced polyneuropathy: Secondary | ICD-10-CM | POA: Diagnosis not present

## 2021-02-14 DIAGNOSIS — C9000 Multiple myeloma not having achieved remission: Secondary | ICD-10-CM | POA: Diagnosis not present

## 2021-02-14 DIAGNOSIS — E860 Dehydration: Secondary | ICD-10-CM | POA: Diagnosis not present

## 2021-02-14 DIAGNOSIS — D801 Nonfamilial hypogammaglobulinemia: Secondary | ICD-10-CM | POA: Diagnosis not present

## 2021-02-14 DIAGNOSIS — G909 Disorder of the autonomic nervous system, unspecified: Secondary | ICD-10-CM | POA: Diagnosis not present

## 2021-02-14 DIAGNOSIS — Z09 Encounter for follow-up examination after completed treatment for conditions other than malignant neoplasm: Secondary | ICD-10-CM | POA: Diagnosis not present

## 2021-02-14 DIAGNOSIS — T451X5A Adverse effect of antineoplastic and immunosuppressive drugs, initial encounter: Secondary | ICD-10-CM | POA: Diagnosis not present

## 2021-02-17 DIAGNOSIS — G893 Neoplasm related pain (acute) (chronic): Secondary | ICD-10-CM | POA: Diagnosis not present

## 2021-02-17 DIAGNOSIS — C9002 Multiple myeloma in relapse: Secondary | ICD-10-CM | POA: Diagnosis not present

## 2021-02-17 DIAGNOSIS — C9000 Multiple myeloma not having achieved remission: Secondary | ICD-10-CM | POA: Diagnosis not present

## 2021-02-25 DIAGNOSIS — J069 Acute upper respiratory infection, unspecified: Secondary | ICD-10-CM | POA: Diagnosis not present

## 2021-03-01 ENCOUNTER — Other Ambulatory Visit (HOSPITAL_COMMUNITY): Payer: Self-pay | Admitting: Hematology and Oncology

## 2021-03-01 DIAGNOSIS — C9002 Multiple myeloma in relapse: Secondary | ICD-10-CM

## 2021-03-03 ENCOUNTER — Other Ambulatory Visit: Payer: Self-pay

## 2021-03-03 ENCOUNTER — Encounter (HOSPITAL_COMMUNITY)
Admission: RE | Admit: 2021-03-03 | Discharge: 2021-03-03 | Disposition: A | Discharge status: Home / Self Care | Payer: PPO | Source: Ambulatory Visit | Attending: Hematology and Oncology | Admitting: Hematology and Oncology

## 2021-03-03 DIAGNOSIS — C9002 Multiple myeloma in relapse: Secondary | ICD-10-CM

## 2021-03-10 ENCOUNTER — Encounter (HOSPITAL_COMMUNITY)
Admission: RE | Admit: 2021-03-10 | Discharge: 2021-03-10 | Disposition: A | Discharge status: Home / Self Care | Payer: PPO | Source: Ambulatory Visit | Attending: Hematology and Oncology | Admitting: Hematology and Oncology

## 2021-03-10 ENCOUNTER — Other Ambulatory Visit: Payer: Self-pay

## 2021-03-10 DIAGNOSIS — C9002 Multiple myeloma in relapse: Secondary | ICD-10-CM | POA: Insufficient documentation

## 2021-03-10 DIAGNOSIS — C9000 Multiple myeloma not having achieved remission: Secondary | ICD-10-CM | POA: Diagnosis not present

## 2021-03-10 MED ORDER — FLUDEOXYGLUCOSE F - 18 (FDG) INJECTION
10.5500 | Freq: Once | INTRAVENOUS | Status: AC
  Administered 2021-03-10: 10.55 via INTRAVENOUS

## 2021-04-19 DIAGNOSIS — C9000 Multiple myeloma not having achieved remission: Secondary | ICD-10-CM | POA: Diagnosis not present

## 2021-04-22 DIAGNOSIS — C9000 Multiple myeloma not having achieved remission: Secondary | ICD-10-CM | POA: Diagnosis not present

## 2021-04-27 DIAGNOSIS — C9000 Multiple myeloma not having achieved remission: Secondary | ICD-10-CM | POA: Diagnosis not present

## 2021-04-29 DIAGNOSIS — Z9484 Stem cells transplant status: Secondary | ICD-10-CM | POA: Diagnosis not present

## 2021-04-29 DIAGNOSIS — Z1159 Encounter for screening for other viral diseases: Secondary | ICD-10-CM | POA: Diagnosis not present

## 2021-04-29 DIAGNOSIS — Z5111 Encounter for antineoplastic chemotherapy: Secondary | ICD-10-CM | POA: Diagnosis not present

## 2021-04-29 DIAGNOSIS — C9000 Multiple myeloma not having achieved remission: Secondary | ICD-10-CM | POA: Diagnosis not present

## 2021-05-10 DIAGNOSIS — K0889 Other specified disorders of teeth and supporting structures: Secondary | ICD-10-CM | POA: Diagnosis not present

## 2021-05-10 DIAGNOSIS — Z1329 Encounter for screening for other suspected endocrine disorder: Secondary | ICD-10-CM | POA: Diagnosis not present

## 2021-05-10 DIAGNOSIS — L299 Pruritus, unspecified: Secondary | ICD-10-CM | POA: Diagnosis not present

## 2021-05-10 DIAGNOSIS — E781 Pure hyperglyceridemia: Secondary | ICD-10-CM | POA: Diagnosis not present

## 2021-05-10 DIAGNOSIS — Z9484 Stem cells transplant status: Secondary | ICD-10-CM | POA: Diagnosis not present

## 2021-05-10 DIAGNOSIS — D630 Anemia in neoplastic disease: Secondary | ICD-10-CM | POA: Diagnosis not present

## 2021-05-10 DIAGNOSIS — G629 Polyneuropathy, unspecified: Secondary | ICD-10-CM | POA: Diagnosis not present

## 2021-05-10 DIAGNOSIS — C9000 Multiple myeloma not having achieved remission: Secondary | ICD-10-CM | POA: Diagnosis not present

## 2021-05-10 DIAGNOSIS — D801 Nonfamilial hypogammaglobulinemia: Secondary | ICD-10-CM | POA: Diagnosis not present

## 2021-05-10 DIAGNOSIS — G893 Neoplasm related pain (acute) (chronic): Secondary | ICD-10-CM | POA: Diagnosis not present

## 2021-05-10 DIAGNOSIS — T451X5A Adverse effect of antineoplastic and immunosuppressive drugs, initial encounter: Secondary | ICD-10-CM | POA: Diagnosis not present

## 2021-05-10 DIAGNOSIS — G620 Drug-induced polyneuropathy: Secondary | ICD-10-CM | POA: Diagnosis not present

## 2021-05-10 DIAGNOSIS — Z7901 Long term (current) use of anticoagulants: Secondary | ICD-10-CM | POA: Diagnosis not present

## 2021-05-10 DIAGNOSIS — I10 Essential (primary) hypertension: Secondary | ICD-10-CM | POA: Diagnosis not present

## 2021-05-10 DIAGNOSIS — R9389 Abnormal findings on diagnostic imaging of other specified body structures: Secondary | ICD-10-CM | POA: Diagnosis not present

## 2021-05-10 DIAGNOSIS — Z87891 Personal history of nicotine dependence: Secondary | ICD-10-CM | POA: Diagnosis not present

## 2021-05-10 DIAGNOSIS — Z79899 Other long term (current) drug therapy: Secondary | ICD-10-CM | POA: Diagnosis not present

## 2021-05-10 DIAGNOSIS — J302 Other seasonal allergic rhinitis: Secondary | ICD-10-CM | POA: Diagnosis not present

## 2021-05-10 DIAGNOSIS — E041 Nontoxic single thyroid nodule: Secondary | ICD-10-CM | POA: Diagnosis not present

## 2021-05-10 DIAGNOSIS — Z1159 Encounter for screening for other viral diseases: Secondary | ICD-10-CM | POA: Diagnosis not present

## 2021-05-10 DIAGNOSIS — Z881 Allergy status to other antibiotic agents status: Secondary | ICD-10-CM | POA: Diagnosis not present

## 2021-05-10 DIAGNOSIS — Z9221 Personal history of antineoplastic chemotherapy: Secondary | ICD-10-CM | POA: Diagnosis not present

## 2021-05-10 DIAGNOSIS — N289 Disorder of kidney and ureter, unspecified: Secondary | ICD-10-CM | POA: Diagnosis not present

## 2021-05-10 DIAGNOSIS — I82411 Acute embolism and thrombosis of right femoral vein: Secondary | ICD-10-CM | POA: Diagnosis not present

## 2021-05-10 DIAGNOSIS — E876 Hypokalemia: Secondary | ICD-10-CM | POA: Diagnosis not present

## 2021-05-10 DIAGNOSIS — E785 Hyperlipidemia, unspecified: Secondary | ICD-10-CM | POA: Diagnosis not present

## 2021-05-10 DIAGNOSIS — Z86718 Personal history of other venous thrombosis and embolism: Secondary | ICD-10-CM | POA: Diagnosis not present

## 2021-05-10 DIAGNOSIS — Z5111 Encounter for antineoplastic chemotherapy: Secondary | ICD-10-CM | POA: Diagnosis not present

## 2021-05-10 DIAGNOSIS — R197 Diarrhea, unspecified: Secondary | ICD-10-CM | POA: Diagnosis not present

## 2021-05-10 DIAGNOSIS — E079 Disorder of thyroid, unspecified: Secondary | ICD-10-CM | POA: Diagnosis not present

## 2021-05-20 DIAGNOSIS — D801 Nonfamilial hypogammaglobulinemia: Secondary | ICD-10-CM | POA: Diagnosis not present

## 2021-05-20 DIAGNOSIS — E042 Nontoxic multinodular goiter: Secondary | ICD-10-CM | POA: Diagnosis not present

## 2021-05-20 DIAGNOSIS — E041 Nontoxic single thyroid nodule: Secondary | ICD-10-CM | POA: Diagnosis not present

## 2021-05-20 DIAGNOSIS — Z09 Encounter for follow-up examination after completed treatment for conditions other than malignant neoplasm: Secondary | ICD-10-CM | POA: Diagnosis not present

## 2021-05-20 DIAGNOSIS — Z9484 Stem cells transplant status: Secondary | ICD-10-CM | POA: Diagnosis not present

## 2021-05-20 DIAGNOSIS — I158 Other secondary hypertension: Secondary | ICD-10-CM | POA: Diagnosis not present

## 2021-05-20 DIAGNOSIS — E876 Hypokalemia: Secondary | ICD-10-CM | POA: Diagnosis not present

## 2021-05-20 DIAGNOSIS — C9000 Multiple myeloma not having achieved remission: Secondary | ICD-10-CM | POA: Diagnosis not present

## 2021-05-26 DIAGNOSIS — C9000 Multiple myeloma not having achieved remission: Secondary | ICD-10-CM | POA: Diagnosis not present

## 2021-05-26 DIAGNOSIS — Z09 Encounter for follow-up examination after completed treatment for conditions other than malignant neoplasm: Secondary | ICD-10-CM | POA: Diagnosis not present

## 2021-05-26 DIAGNOSIS — E876 Hypokalemia: Secondary | ICD-10-CM | POA: Diagnosis not present

## 2021-05-26 DIAGNOSIS — Z9484 Stem cells transplant status: Secondary | ICD-10-CM | POA: Diagnosis not present

## 2021-05-26 DIAGNOSIS — I82411 Acute embolism and thrombosis of right femoral vein: Secondary | ICD-10-CM | POA: Diagnosis not present

## 2021-05-26 DIAGNOSIS — G620 Drug-induced polyneuropathy: Secondary | ICD-10-CM | POA: Diagnosis not present

## 2021-05-26 DIAGNOSIS — T451X5A Adverse effect of antineoplastic and immunosuppressive drugs, initial encounter: Secondary | ICD-10-CM | POA: Diagnosis not present

## 2021-05-26 DIAGNOSIS — D801 Nonfamilial hypogammaglobulinemia: Secondary | ICD-10-CM | POA: Diagnosis not present

## 2021-05-31 DIAGNOSIS — Z9484 Stem cells transplant status: Secondary | ICD-10-CM | POA: Diagnosis not present

## 2021-05-31 DIAGNOSIS — Z1159 Encounter for screening for other viral diseases: Secondary | ICD-10-CM | POA: Diagnosis not present

## 2021-05-31 DIAGNOSIS — Z5112 Encounter for antineoplastic immunotherapy: Secondary | ICD-10-CM | POA: Diagnosis not present

## 2021-05-31 DIAGNOSIS — C9000 Multiple myeloma not having achieved remission: Secondary | ICD-10-CM | POA: Diagnosis not present

## 2021-06-06 DIAGNOSIS — Z1159 Encounter for screening for other viral diseases: Secondary | ICD-10-CM | POA: Diagnosis not present

## 2021-06-06 DIAGNOSIS — C9000 Multiple myeloma not having achieved remission: Secondary | ICD-10-CM | POA: Diagnosis not present

## 2021-06-06 DIAGNOSIS — Z9484 Stem cells transplant status: Secondary | ICD-10-CM | POA: Diagnosis not present

## 2021-06-06 DIAGNOSIS — Z5111 Encounter for antineoplastic chemotherapy: Secondary | ICD-10-CM | POA: Diagnosis not present

## 2021-06-07 DIAGNOSIS — Z9484 Stem cells transplant status: Secondary | ICD-10-CM | POA: Diagnosis not present

## 2021-06-07 DIAGNOSIS — C9000 Multiple myeloma not having achieved remission: Secondary | ICD-10-CM | POA: Diagnosis not present

## 2021-06-07 DIAGNOSIS — Z5112 Encounter for antineoplastic immunotherapy: Secondary | ICD-10-CM | POA: Diagnosis not present

## 2021-06-07 DIAGNOSIS — Z1159 Encounter for screening for other viral diseases: Secondary | ICD-10-CM | POA: Diagnosis not present

## 2021-06-13 DIAGNOSIS — G629 Polyneuropathy, unspecified: Secondary | ICD-10-CM | POA: Diagnosis not present

## 2021-06-13 DIAGNOSIS — C9000 Multiple myeloma not having achieved remission: Secondary | ICD-10-CM | POA: Diagnosis not present

## 2021-06-13 DIAGNOSIS — Z1159 Encounter for screening for other viral diseases: Secondary | ICD-10-CM | POA: Diagnosis not present

## 2021-06-13 DIAGNOSIS — R6884 Jaw pain: Secondary | ICD-10-CM | POA: Diagnosis not present

## 2021-06-13 DIAGNOSIS — Z9484 Stem cells transplant status: Secondary | ICD-10-CM | POA: Diagnosis not present

## 2021-06-13 DIAGNOSIS — Z5111 Encounter for antineoplastic chemotherapy: Secondary | ICD-10-CM | POA: Diagnosis not present

## 2021-06-13 DIAGNOSIS — R220 Localized swelling, mass and lump, head: Secondary | ICD-10-CM | POA: Diagnosis not present

## 2021-06-23 DIAGNOSIS — Z7983 Long term (current) use of bisphosphonates: Secondary | ICD-10-CM | POA: Diagnosis not present

## 2021-06-23 DIAGNOSIS — Z9221 Personal history of antineoplastic chemotherapy: Secondary | ICD-10-CM | POA: Diagnosis not present

## 2021-06-23 DIAGNOSIS — J329 Chronic sinusitis, unspecified: Secondary | ICD-10-CM | POA: Diagnosis not present

## 2021-06-23 DIAGNOSIS — Z79899 Other long term (current) drug therapy: Secondary | ICD-10-CM | POA: Diagnosis not present

## 2021-06-23 DIAGNOSIS — D649 Anemia, unspecified: Secondary | ICD-10-CM | POA: Diagnosis not present

## 2021-06-23 DIAGNOSIS — J392 Other diseases of pharynx: Secondary | ICD-10-CM | POA: Diagnosis not present

## 2021-06-23 DIAGNOSIS — E785 Hyperlipidemia, unspecified: Secondary | ICD-10-CM | POA: Diagnosis not present

## 2021-06-23 DIAGNOSIS — Z9484 Stem cells transplant status: Secondary | ICD-10-CM | POA: Diagnosis not present

## 2021-06-23 DIAGNOSIS — J3489 Other specified disorders of nose and nasal sinuses: Secondary | ICD-10-CM | POA: Diagnosis not present

## 2021-06-23 DIAGNOSIS — E041 Nontoxic single thyroid nodule: Secondary | ICD-10-CM | POA: Diagnosis not present

## 2021-06-23 DIAGNOSIS — Z7901 Long term (current) use of anticoagulants: Secondary | ICD-10-CM | POA: Diagnosis not present

## 2021-06-23 DIAGNOSIS — C9002 Multiple myeloma in relapse: Secondary | ICD-10-CM | POA: Diagnosis not present

## 2021-06-23 DIAGNOSIS — F17210 Nicotine dependence, cigarettes, uncomplicated: Secondary | ICD-10-CM | POA: Diagnosis not present

## 2021-06-23 DIAGNOSIS — I10 Essential (primary) hypertension: Secondary | ICD-10-CM | POA: Diagnosis not present

## 2021-06-27 DIAGNOSIS — R5383 Other fatigue: Secondary | ICD-10-CM | POA: Diagnosis not present

## 2021-06-27 DIAGNOSIS — C9000 Multiple myeloma not having achieved remission: Secondary | ICD-10-CM | POA: Diagnosis not present

## 2021-06-27 DIAGNOSIS — G620 Drug-induced polyneuropathy: Secondary | ICD-10-CM | POA: Diagnosis not present

## 2021-06-27 DIAGNOSIS — J392 Other diseases of pharynx: Secondary | ICD-10-CM | POA: Diagnosis not present

## 2021-06-27 DIAGNOSIS — T451X5A Adverse effect of antineoplastic and immunosuppressive drugs, initial encounter: Secondary | ICD-10-CM | POA: Diagnosis not present

## 2021-06-27 DIAGNOSIS — Z9484 Stem cells transplant status: Secondary | ICD-10-CM | POA: Diagnosis not present

## 2021-06-27 DIAGNOSIS — Z5111 Encounter for antineoplastic chemotherapy: Secondary | ICD-10-CM | POA: Diagnosis not present

## 2021-06-27 DIAGNOSIS — C9002 Multiple myeloma in relapse: Secondary | ICD-10-CM | POA: Diagnosis not present

## 2021-06-27 DIAGNOSIS — C4491 Basal cell carcinoma of skin, unspecified: Secondary | ICD-10-CM | POA: Diagnosis not present

## 2021-06-27 DIAGNOSIS — Z720 Tobacco use: Secondary | ICD-10-CM | POA: Diagnosis not present

## 2021-06-27 DIAGNOSIS — D801 Nonfamilial hypogammaglobulinemia: Secondary | ICD-10-CM | POA: Diagnosis not present

## 2021-06-27 DIAGNOSIS — Z1159 Encounter for screening for other viral diseases: Secondary | ICD-10-CM | POA: Diagnosis not present

## 2021-06-27 DIAGNOSIS — G909 Disorder of the autonomic nervous system, unspecified: Secondary | ICD-10-CM | POA: Diagnosis not present

## 2021-06-30 DIAGNOSIS — Z9484 Stem cells transplant status: Secondary | ICD-10-CM | POA: Diagnosis not present

## 2021-06-30 DIAGNOSIS — Z5112 Encounter for antineoplastic immunotherapy: Secondary | ICD-10-CM | POA: Diagnosis not present

## 2021-06-30 DIAGNOSIS — Z1159 Encounter for screening for other viral diseases: Secondary | ICD-10-CM | POA: Diagnosis not present

## 2021-06-30 DIAGNOSIS — C9000 Multiple myeloma not having achieved remission: Secondary | ICD-10-CM | POA: Diagnosis not present

## 2021-07-06 DIAGNOSIS — G620 Drug-induced polyneuropathy: Secondary | ICD-10-CM | POA: Diagnosis not present

## 2021-07-06 DIAGNOSIS — C9000 Multiple myeloma not having achieved remission: Secondary | ICD-10-CM | POA: Diagnosis not present

## 2021-07-06 DIAGNOSIS — T451X5A Adverse effect of antineoplastic and immunosuppressive drugs, initial encounter: Secondary | ICD-10-CM | POA: Diagnosis not present

## 2021-07-07 DIAGNOSIS — C9000 Multiple myeloma not having achieved remission: Secondary | ICD-10-CM | POA: Diagnosis not present

## 2021-07-07 DIAGNOSIS — Z5112 Encounter for antineoplastic immunotherapy: Secondary | ICD-10-CM | POA: Diagnosis not present

## 2021-07-07 DIAGNOSIS — Z1159 Encounter for screening for other viral diseases: Secondary | ICD-10-CM | POA: Diagnosis not present

## 2021-07-07 DIAGNOSIS — Z9484 Stem cells transplant status: Secondary | ICD-10-CM | POA: Diagnosis not present

## 2021-07-13 DIAGNOSIS — Z09 Encounter for follow-up examination after completed treatment for conditions other than malignant neoplasm: Secondary | ICD-10-CM | POA: Diagnosis not present

## 2021-07-13 DIAGNOSIS — G620 Drug-induced polyneuropathy: Secondary | ICD-10-CM | POA: Diagnosis not present

## 2021-07-13 DIAGNOSIS — D801 Nonfamilial hypogammaglobulinemia: Secondary | ICD-10-CM | POA: Diagnosis not present

## 2021-07-13 DIAGNOSIS — Z9484 Stem cells transplant status: Secondary | ICD-10-CM | POA: Diagnosis not present

## 2021-07-13 DIAGNOSIS — Z1159 Encounter for screening for other viral diseases: Secondary | ICD-10-CM | POA: Diagnosis not present

## 2021-07-13 DIAGNOSIS — T451X5A Adverse effect of antineoplastic and immunosuppressive drugs, initial encounter: Secondary | ICD-10-CM | POA: Diagnosis not present

## 2021-07-13 DIAGNOSIS — E876 Hypokalemia: Secondary | ICD-10-CM | POA: Diagnosis not present

## 2021-07-13 DIAGNOSIS — C9000 Multiple myeloma not having achieved remission: Secondary | ICD-10-CM | POA: Diagnosis not present

## 2021-07-13 DIAGNOSIS — Z5111 Encounter for antineoplastic chemotherapy: Secondary | ICD-10-CM | POA: Diagnosis not present

## 2021-07-13 DIAGNOSIS — I159 Secondary hypertension, unspecified: Secondary | ICD-10-CM | POA: Diagnosis not present

## 2021-07-14 DIAGNOSIS — C9000 Multiple myeloma not having achieved remission: Secondary | ICD-10-CM | POA: Diagnosis not present

## 2021-07-14 DIAGNOSIS — Z5112 Encounter for antineoplastic immunotherapy: Secondary | ICD-10-CM | POA: Diagnosis not present

## 2021-07-14 DIAGNOSIS — Z1159 Encounter for screening for other viral diseases: Secondary | ICD-10-CM | POA: Diagnosis not present

## 2021-07-14 DIAGNOSIS — Z9484 Stem cells transplant status: Secondary | ICD-10-CM | POA: Diagnosis not present

## 2021-07-20 DIAGNOSIS — T451X5A Adverse effect of antineoplastic and immunosuppressive drugs, initial encounter: Secondary | ICD-10-CM | POA: Diagnosis not present

## 2021-07-20 DIAGNOSIS — C9002 Multiple myeloma in relapse: Secondary | ICD-10-CM | POA: Diagnosis not present

## 2021-07-20 DIAGNOSIS — G620 Drug-induced polyneuropathy: Secondary | ICD-10-CM | POA: Diagnosis not present

## 2021-07-20 DIAGNOSIS — K5903 Drug induced constipation: Secondary | ICD-10-CM | POA: Diagnosis not present

## 2021-07-20 DIAGNOSIS — G893 Neoplasm related pain (acute) (chronic): Secondary | ICD-10-CM | POA: Diagnosis not present

## 2021-07-20 DIAGNOSIS — D801 Nonfamilial hypogammaglobulinemia: Secondary | ICD-10-CM | POA: Diagnosis not present

## 2021-07-20 DIAGNOSIS — Z09 Encounter for follow-up examination after completed treatment for conditions other than malignant neoplasm: Secondary | ICD-10-CM | POA: Diagnosis not present

## 2021-07-20 DIAGNOSIS — Z9484 Stem cells transplant status: Secondary | ICD-10-CM | POA: Diagnosis not present

## 2021-07-20 DIAGNOSIS — R5383 Other fatigue: Secondary | ICD-10-CM | POA: Diagnosis not present

## 2021-07-21 DIAGNOSIS — C9000 Multiple myeloma not having achieved remission: Secondary | ICD-10-CM | POA: Diagnosis not present

## 2021-07-21 DIAGNOSIS — Z7952 Long term (current) use of systemic steroids: Secondary | ICD-10-CM | POA: Diagnosis not present

## 2021-07-21 DIAGNOSIS — Z5111 Encounter for antineoplastic chemotherapy: Secondary | ICD-10-CM | POA: Diagnosis not present

## 2021-07-21 DIAGNOSIS — Z79899 Other long term (current) drug therapy: Secondary | ICD-10-CM | POA: Diagnosis not present

## 2021-07-21 DIAGNOSIS — Z9484 Stem cells transplant status: Secondary | ICD-10-CM | POA: Diagnosis not present

## 2021-07-21 DIAGNOSIS — Z1159 Encounter for screening for other viral diseases: Secondary | ICD-10-CM | POA: Diagnosis not present

## 2021-07-25 DIAGNOSIS — Z880 Allergy status to penicillin: Secondary | ICD-10-CM | POA: Diagnosis not present

## 2021-07-25 DIAGNOSIS — Z9221 Personal history of antineoplastic chemotherapy: Secondary | ICD-10-CM | POA: Diagnosis not present

## 2021-07-25 DIAGNOSIS — C9000 Multiple myeloma not having achieved remission: Secondary | ICD-10-CM | POA: Diagnosis not present

## 2021-07-25 DIAGNOSIS — Z881 Allergy status to other antibiotic agents status: Secondary | ICD-10-CM | POA: Diagnosis not present

## 2021-07-25 DIAGNOSIS — G629 Polyneuropathy, unspecified: Secondary | ICD-10-CM | POA: Diagnosis not present

## 2021-07-28 DIAGNOSIS — Z5111 Encounter for antineoplastic chemotherapy: Secondary | ICD-10-CM | POA: Diagnosis not present

## 2021-07-28 DIAGNOSIS — Z1159 Encounter for screening for other viral diseases: Secondary | ICD-10-CM | POA: Diagnosis not present

## 2021-07-28 DIAGNOSIS — Z9484 Stem cells transplant status: Secondary | ICD-10-CM | POA: Diagnosis not present

## 2021-07-28 DIAGNOSIS — C9000 Multiple myeloma not having achieved remission: Secondary | ICD-10-CM | POA: Diagnosis not present

## 2021-08-03 DIAGNOSIS — T451X5A Adverse effect of antineoplastic and immunosuppressive drugs, initial encounter: Secondary | ICD-10-CM | POA: Diagnosis not present

## 2021-08-03 DIAGNOSIS — Z1159 Encounter for screening for other viral diseases: Secondary | ICD-10-CM | POA: Diagnosis not present

## 2021-08-03 DIAGNOSIS — G620 Drug-induced polyneuropathy: Secondary | ICD-10-CM | POA: Diagnosis not present

## 2021-08-03 DIAGNOSIS — Z9484 Stem cells transplant status: Secondary | ICD-10-CM | POA: Diagnosis not present

## 2021-08-03 DIAGNOSIS — C9000 Multiple myeloma not having achieved remission: Secondary | ICD-10-CM | POA: Diagnosis not present

## 2021-08-03 DIAGNOSIS — D801 Nonfamilial hypogammaglobulinemia: Secondary | ICD-10-CM | POA: Diagnosis not present

## 2021-08-03 DIAGNOSIS — Z5111 Encounter for antineoplastic chemotherapy: Secondary | ICD-10-CM | POA: Diagnosis not present

## 2021-08-05 ENCOUNTER — Ambulatory Visit
Admission: EM | Admit: 2021-08-05 | Discharge: 2021-08-05 | Disposition: A | Discharge status: Home / Self Care | Payer: PPO

## 2021-08-05 DIAGNOSIS — Z8719 Personal history of other diseases of the digestive system: Secondary | ICD-10-CM

## 2021-08-05 DIAGNOSIS — R1032 Left lower quadrant pain: Secondary | ICD-10-CM | POA: Diagnosis not present

## 2021-08-05 MED ORDER — METRONIDAZOLE 500 MG PO TABS: 500.0000 mg | ORAL_TABLET | Freq: Three times a day (TID) | ORAL | 0 refills | Status: AC

## 2021-08-05 MED ORDER — CIPROFLOXACIN HCL 500 MG PO TABS: 500.0000 mg | ORAL_TABLET | Freq: Two times a day (BID) | ORAL | 0 refills | Status: AC

## 2021-08-05 NOTE — ED Triage Notes (Signed)
Pt presents with left lower quadrant pain that began yesterday, reports h/o diverticulitis. Also reports mucous and blood in stool

## 2021-08-05 NOTE — Discharge Instructions (Addendum)
-   Please push hydration with plenty of liquids and try to eat a clear liquid diet only for the next few days - Start the Cipro and Flagyl (antibiotics) to help treat diverticulitis - If your blood pressure tomorrow morning is low less than 100/60, please do not take the amlodipine - If you start having severe pain, nausea/vomiting and unable to keep fluids down, severe rectal bleeding, or symptoms of systemic infection like fever/chills, extreme fatigue, mental status changes, please go to the emergency room

## 2021-08-05 NOTE — ED Provider Notes (Signed)
[facility]    CSN: [id number] Arrival date & time: 08/05/21  1305      History   Chief Complaint Chief Complaint  Patient presents with   Abdominal Pain    HPI Craig Rivera is a 66 y.o. male.   Patient presents with 1 day of left lower quadrant abdominal pain.  Reports the pain comes on suddenly as a cramping pain, he has a bowel movement and this usually relieves the pain.  Reports currently, his pain is a 2 out of 10.  At times, when there is cramping it increases to a 5-6 out of 10.  The pain lasts a few minutes.  Patient denies pain radiating to his back or down his leg.  He also denies fever, nausea/vomiting, unexplained weight loss, rash, dysuria/urinary frequency, hematuria, frequent NSAID use.  The patient endorses decreased appetite, diarrhea, constipation, blood and mucus in his stool, and a little bit of indigestion.  He has taken Tums which helped with the indigestion.  Certain movements make the pain worse, for example when he leans over in a hunched over position.  He describes a blood in his stool as a little bit of mucus; maybe 1 to 2 tablespoons of blood.  Reports this is happened 4-5 times in the past 24 hours.  He does take Xarelto and has a history of blood clots.  The patient currently denies any dizziness/lightheadedness, chest pain, shortness of breath, vision changes, swelling in his legs.  He has eaten toast and has been able to keep it down, however has been worried to eat anything else.    Medical history significant for diverticulitis, multiple myeloma, history of bone marrow transplant, DVT.    Past Medical History:  Diagnosis Date   Back pain    HTN (hypertension)    Hypercholesterolemia    Multiple myeloma ([place])    Sciatic nerve pain     Patient Active Problem List   Diagnosis Date Noted   Hypokalemia 11/09/2015   Multiple myeloma ([place]) 07/22/2015   Encounter for screening colonoscopy 08/30/2010    Past Surgical History:   Procedure Laterality Date   BONE MARROW TRANSPLANT     CATARACT EXTRACTION W/PHACO Right 08/11/2014   Procedure: CATARACT EXTRACTION PHACO AND INTRAOCULAR LENS PLACEMENT ([place]);  Surgeon: Williams Che, MD;  Location: [facility];  Service: Ophthalmology;  Laterality: Right;  CDE 4.88   CATARACT EXTRACTION W/PHACO Left 10/26/2014   Procedure: CATARACT EXTRACTION PHACO AND INTRAOCULAR LENS PLACEMENT LEFT EYE CDE=8.64;  Surgeon: Williams Che, MD;  Location: [facility];  Service: Ophthalmology;  Laterality: Left;   COLONOSCOPY  09/12/2010   Procedure: COLONOSCOPY;  Surgeon: Daneil Dolin, MD;  Location[facility];  Service: Endoscopy;  Laterality: N/A;   inguinal hernia repair bilateral     as child    KNEE SURGERY Right    arthroscopy   TONSILLECTOMY         Home Medications    Prior to Admission medications   Medication Sig Start Date End Date Taking? Authorizing Provider  ciprofloxacin (CIPRO) 500 MG tablet Take 1 tablet (500 mg total) by mouth 2 (two) times daily for 7 days. 08/05/21 08/12/21 Yes Eulogio Bear, NP  metroNIDAZOLE (FLAGYL) 500 MG tablet Take 1 tablet (500 mg total) by mouth 3 (three) times daily for 7 days. 08/05/21 08/12/21 Yes Eulogio Bear, NP  acetaminophen (TYLENOL) 325 MG tablet Take 650 mg by mouth every 6 (six) hours as needed for mild  pain.     [provider]  acyclovir (ZOVIRAX) 200 MG capsule TAKE 2 CAPSULES BY MOUTH TWICE A DAY 04/18/17   Holley Bouche, NP  amLODipine (NORVASC) 10 MG tablet Take 10 mg by mouth daily.  05/25/10   [provider]  fentaNYL (DURAGESIC - DOSED MCG/HR) 75 MCG/HR Place 1 patch (75 mcg total) onto the skin every other day. 07/04/17   Higgs, Mathis Dad, MD  FIBER PO Take by mouth.    [provider]  oxyCODONE (OXY IR/ROXICODONE) 5 MG immediate release tablet Take 1 tablet (5 mg total) by mouth every 4 (four) hours as needed. pain 04/04/17   Holley Bouche, NP  POMALYST 3 MG capsule  05/05/21    [provider]    Family History Family History  Problem Relation Age of Onset   Colon polyps Father        passed away, Alzhemiers   Colon polyps Sister    Migraines Mother        deceased    Social History Social History   Tobacco Use   Smoking status: Every Day    Packs/day: 0.50    Years: 40.00    Total pack years: 20.00    Types: Cigarettes   Smokeless tobacco: Never  Substance Use Topics   Alcohol use: No   Drug use: No     Allergies   Penicillins, Ceclor [cefaclor], and Lenalidomide   Review of Systems Review of Systems Per HPI  Physical Exam Triage Vital Signs ED Triage Vitals  Enc Vitals Group     BP 08/05/21 1328 92/62     Pulse Rate 08/05/21 1328 78     Resp 08/05/21 1328 20     Temp 08/05/21 1328 98.3 F (36.8 C)     Temp src --      SpO2 08/05/21 1328 94 %     Weight --      Height --      Head Circumference --      Peak Flow --      Pain Score 08/05/21 1325 2     Pain Loc --      Pain Edu? --      Excl. in [place]? --    No data found.  Updated Vital Signs BP 92/62   Pulse 78   Temp 98.3 F (36.8 C)   Resp 20   SpO2 94%   Visual Acuity Right Eye Distance:   Left Eye Distance:   Bilateral Distance:    Right Eye Near:   Left Eye Near:    Bilateral Near:     Physical Exam Vitals and nursing note reviewed.  Constitutional:      General: He is not in acute distress.    Appearance: Normal appearance. He is well-developed. He is not ill-appearing, toxic-appearing or diaphoretic.  HENT:     Head: Normocephalic and atraumatic.     Mouth/Throat:     Mouth: Mucous membranes are moist.     Pharynx: Oropharynx is clear. No posterior oropharyngeal erythema.  Cardiovascular:     Rate and Rhythm: Normal rate and regular rhythm.  Pulmonary:     Effort: Pulmonary effort is normal. No respiratory distress.     Breath sounds: Normal breath sounds. No wheezing, rhonchi or rales.  Abdominal:     General: Abdomen is flat. Bowel  sounds are decreased. There is no distension.     Palpations: Abdomen is soft.     Tenderness: There  is abdominal tenderness in the left lower quadrant. There is no right CVA tenderness, left CVA tenderness, guarding or rebound. Negative signs include Murphy's sign and McBurney's sign.  Musculoskeletal:     Cervical back: Normal range of motion.  Lymphadenopathy:     Cervical: No cervical adenopathy.  Skin:    General: Skin is warm and dry.     Capillary Refill: Capillary refill takes less than 2 seconds.     Coloration: Skin is not jaundiced or pale.     Findings: No erythema.  Neurological:     Mental Status: He is alert and oriented to person, place, and time.     Gait: Gait normal.  Psychiatric:        Behavior: Behavior is cooperative.      UC Treatments / Results  Labs (all labs ordered are listed, but only abnormal results are displayed) Labs Reviewed - No data to display  EKG   Radiology No results found.  Procedures Procedures (including critical care time)  Medications Ordered in UC Medications - No data to display  Initial Impression / Assessment and Plan / UC Course  I have reviewed the triage vital signs and the nursing notes.  Pertinent labs & imaging results that were available during my care of the patient were reviewed by me and considered in my medical decision making (see chart for details).    Patient is a very pleasant, well-appearing 66 year old male with left lower quadrant abdominal pain.  History and exam are consistent with diverticulitis, and he does have a history of this.  Given his immunocompromised status, will treat with ciprofloxacin 500 mg twice daily for 7 days along with metronidazole 500 mg every 8 hours for 7 days.  We also discussed clear liquid diet, pushing plenty of hydration.  Blood pressure is a little bit on the soft side today, I encouraged him to monitor his blood pressure at home and immediately call 911 if he develops  lightheadedness/dizziness, syncope, presyncope, chest pain, shortness of breath.  Encouraged plenty of hydration with fluids.  Hold blood pressure pill in the morning if blood pressure remains on the low side.  Follow-up with primary care provider early next week if symptoms persist despite treatment.  Patient declines blood work today.  ER precautions discussed at length. The patient was given the opportunity to ask questions.  All questions answered to their satisfaction.  The patient is in agreement to this plan.   Final Clinical Impressions(s) / UC Diagnoses   Final diagnoses:  LLQ pain  History of diverticulitis     Discharge Instructions      - Please push hydration with plenty of liquids and try to eat a clear liquid diet only for the next few days - Start the Cipro and Flagyl (antibiotics) to help treat diverticulitis - If your blood pressure tomorrow morning is low less than 100/60, please do not take the amlodipine - If you start having severe pain, nausea/vomiting and unable to keep fluids down, severe rectal bleeding, or symptoms of systemic infection like fever/chills, extreme fatigue, mental status changes, please go to the emergency room     ED Prescriptions     Medication Sig Dispense Auth. Provider   metroNIDAZOLE (FLAGYL) 500 MG tablet Take 1 tablet (500 mg total) by mouth 3 (three) times daily for 7 days. 21 tablet Noemi Chapel A, NP   ciprofloxacin (CIPRO) 500 MG tablet Take 1 tablet (500 mg total) by mouth 2 (two) times daily for 7  days. 14 tablet Eulogio Bear, NP      PDMP not reviewed this encounter.   Eulogio Bear, NP 08/05/21 1450

## 2021-08-08 DIAGNOSIS — Z5111 Encounter for antineoplastic chemotherapy: Secondary | ICD-10-CM | POA: Diagnosis not present

## 2021-08-08 DIAGNOSIS — G893 Neoplasm related pain (acute) (chronic): Secondary | ICD-10-CM | POA: Diagnosis not present

## 2021-08-08 DIAGNOSIS — I158 Other secondary hypertension: Secondary | ICD-10-CM | POA: Diagnosis not present

## 2021-08-08 DIAGNOSIS — D801 Nonfamilial hypogammaglobulinemia: Secondary | ICD-10-CM | POA: Diagnosis not present

## 2021-08-08 DIAGNOSIS — Z09 Encounter for follow-up examination after completed treatment for conditions other than malignant neoplasm: Secondary | ICD-10-CM | POA: Diagnosis not present

## 2021-08-08 DIAGNOSIS — T451X5A Adverse effect of antineoplastic and immunosuppressive drugs, initial encounter: Secondary | ICD-10-CM | POA: Diagnosis not present

## 2021-08-08 DIAGNOSIS — G620 Drug-induced polyneuropathy: Secondary | ICD-10-CM | POA: Diagnosis not present

## 2021-08-08 DIAGNOSIS — C9000 Multiple myeloma not having achieved remission: Secondary | ICD-10-CM | POA: Diagnosis not present

## 2021-08-10 DIAGNOSIS — D801 Nonfamilial hypogammaglobulinemia: Secondary | ICD-10-CM | POA: Diagnosis not present

## 2021-08-10 DIAGNOSIS — C9000 Multiple myeloma not having achieved remission: Secondary | ICD-10-CM | POA: Diagnosis not present

## 2021-08-10 DIAGNOSIS — C9002 Multiple myeloma in relapse: Secondary | ICD-10-CM | POA: Diagnosis not present

## 2021-08-10 DIAGNOSIS — Z9484 Stem cells transplant status: Secondary | ICD-10-CM | POA: Diagnosis not present

## 2021-08-11 DIAGNOSIS — Z7952 Long term (current) use of systemic steroids: Secondary | ICD-10-CM | POA: Diagnosis not present

## 2021-08-11 DIAGNOSIS — Z5111 Encounter for antineoplastic chemotherapy: Secondary | ICD-10-CM | POA: Diagnosis not present

## 2021-08-11 DIAGNOSIS — C9000 Multiple myeloma not having achieved remission: Secondary | ICD-10-CM | POA: Diagnosis not present

## 2021-08-11 DIAGNOSIS — Z1159 Encounter for screening for other viral diseases: Secondary | ICD-10-CM | POA: Diagnosis not present

## 2021-08-11 DIAGNOSIS — Z5112 Encounter for antineoplastic immunotherapy: Secondary | ICD-10-CM | POA: Diagnosis not present

## 2021-08-11 DIAGNOSIS — Z9484 Stem cells transplant status: Secondary | ICD-10-CM | POA: Diagnosis not present

## 2021-08-20 ENCOUNTER — Ambulatory Visit
Admission: EM | Admit: 2021-08-20 | Discharge: 2021-08-20 | Disposition: A | Discharge status: Home / Self Care | Payer: PPO | Attending: Physician Assistant | Admitting: Physician Assistant

## 2021-08-20 ENCOUNTER — Encounter: Payer: Self-pay | Admitting: Emergency Medicine

## 2021-08-20 ENCOUNTER — Ambulatory Visit (INDEPENDENT_AMBULATORY_CARE_PROVIDER_SITE_OTHER): Payer: PPO

## 2021-08-20 DIAGNOSIS — J441 Chronic obstructive pulmonary disease with (acute) exacerbation: Secondary | ICD-10-CM

## 2021-08-20 DIAGNOSIS — R0989 Other specified symptoms and signs involving the circulatory and respiratory systems: Secondary | ICD-10-CM | POA: Diagnosis not present

## 2021-08-20 DIAGNOSIS — R519 Headache, unspecified: Secondary | ICD-10-CM | POA: Diagnosis not present

## 2021-08-20 DIAGNOSIS — R059 Cough, unspecified: Secondary | ICD-10-CM

## 2021-08-20 DIAGNOSIS — J439 Emphysema, unspecified: Secondary | ICD-10-CM | POA: Diagnosis not present

## 2021-08-20 DIAGNOSIS — Z8579 Personal history of other malignant neoplasms of lymphoid, hematopoietic and related tissues: Secondary | ICD-10-CM

## 2021-08-20 DIAGNOSIS — R051 Acute cough: Secondary | ICD-10-CM

## 2021-08-20 MED ORDER — DOXYCYCLINE HYCLATE 100 MG PO CAPS: 100.0000 mg | ORAL_CAPSULE | Freq: Two times a day (BID) | ORAL | 0 refills | Status: DC

## 2021-08-20 NOTE — ED Provider Notes (Signed)
[facility]    CSN: [id number] Arrival date & time: 08/20/21  0850      History   Chief Complaint Chief Complaint  Patient presents with   Nasal Congestion   Cough   Ear Fullness   Headache    HPI Craig Rivera is a 66 y.o. male.   Patient presents today with a several week history of cough.  Reports associated headache, sinus congestion, sore throat, drainage.  Denies any chest pain, shortness of breath, fever, nausea, vomiting.  He has been taking DayQuil and Mucinex DM without improvement of symptoms.  Denies any known sick contacts.  He does report that he was recently treated with ciprofloxacin and metronidazole for diverticulitis flare 08/05/2021.  This is since resolved and he is feeling much better with no abdominal symptoms.  He does have a history of multiple myeloma and is immunosuppressed.  He is scheduled to have his next treatment next week and is anxious to feel better prior to this in order to prevent postponing treatment.  He does smoke.  Denies history of COPD or asthma.    Past Medical History:  Diagnosis Date   Back pain    HTN (hypertension)    Hypercholesterolemia    Multiple myeloma ([place])    Sciatic nerve pain     Patient Active Problem List   Diagnosis Date Noted   Hypokalemia 11/09/2015   Multiple myeloma ([place]) 07/22/2015   Encounter for screening colonoscopy 08/30/2010    Past Surgical History:  Procedure Laterality Date   BONE MARROW TRANSPLANT     CATARACT EXTRACTION W/PHACO Right 08/11/2014   Procedure: CATARACT EXTRACTION PHACO AND INTRAOCULAR LENS PLACEMENT ([place]);  Surgeon: Williams Che, MD;  Location: [facility];  Service: Ophthalmology;  Laterality: Right;  CDE 4.88   CATARACT EXTRACTION W/PHACO Left 10/26/2014   Procedure: CATARACT EXTRACTION PHACO AND INTRAOCULAR LENS PLACEMENT LEFT EYE CDE=8.64;  Surgeon: Williams Che, MD;  Location: [facility];  Service: Ophthalmology;  Laterality: Left;   COLONOSCOPY  09/12/2010    Procedure: COLONOSCOPY;  Surgeon: Daneil Dolin, MD;  [facility];  Service: Endoscopy;  Laterality: N/A;   inguinal hernia repair bilateral     as child    KNEE SURGERY Right    arthroscopy   TONSILLECTOMY         Home Medications    Prior to Admission medications   Medication Sig Start Date End Date Taking? Authorizing Provider  doxycycline (VIBRAMYCIN) 100 MG capsule Take 1 capsule (100 mg total) by mouth 2 (two) times daily. 08/20/21  Yes Raspet, Derry Skill, PA-C  acetaminophen (TYLENOL) 325 MG tablet Take 650 mg by mouth every 6 (six) hours as needed for mild pain.     [provider]  acyclovir (ZOVIRAX) 200 MG capsule TAKE 2 CAPSULES BY MOUTH TWICE A DAY 04/18/17   Holley Bouche, NP  amLODipine (NORVASC) 10 MG tablet Take 10 mg by mouth daily.  05/25/10   [provider]  fentaNYL (DURAGESIC - DOSED MCG/HR) 75 MCG/HR Place 1 patch (75 mcg total) onto the skin every other day. 07/04/17   Higgs, Mathis Dad, MD  FIBER PO Take by mouth.    [provider]  oxyCODONE (OXY IR/ROXICODONE) 5 MG immediate release tablet Take 1 tablet (5 mg total) by mouth every 4 (four) hours as needed. pain 04/04/17   Holley Bouche, NP  POMALYST 3 MG capsule  05/05/21   [provider]    [facility]  History Family History  Problem Relation Age of Onset   Colon polyps Father        passed away, Alzhemiers   Colon polyps Sister    Migraines Mother        deceased    Social History Social History   Tobacco Use   Smoking status: Every Day    Packs/day: 0.50    Years: 40.00    Total pack years: 20.00    Types: Cigarettes   Smokeless tobacco: Never  Substance Use Topics   Alcohol use: No   Drug use: No     Allergies   Penicillins, Ceclor [cefaclor], and Lenalidomide   Review of Systems Review of Systems  Constitutional:  Positive for activity change and fever. Negative for appetite change and fatigue.  HENT:  Positive for congestion, postnasal  drip, sinus pressure and sore throat. Negative for sneezing.   Respiratory:  Positive for cough. Negative for shortness of breath.   Cardiovascular:  Negative for chest pain.  Gastrointestinal:  Negative for abdominal pain, diarrhea, nausea and vomiting.  Neurological:  Negative for dizziness, light-headedness and headaches.     Physical Exam Triage Vital Signs ED Triage Vitals  Enc Vitals Group     BP 08/20/21 0857 132/77     Pulse Rate 08/20/21 0857 80     Resp 08/20/21 0857 20     Temp 08/20/21 0857 100.3 F (37.9 C)     Temp src --      SpO2 08/20/21 0857 92 %     Weight --      Height --      Head Circumference --      Peak Flow --      Pain Score 08/20/21 0859 0     Pain Loc --      Pain Edu? --      Excl. in [place]? --    No data found.  Updated Vital Signs BP 132/77   Pulse 80   Temp 100.3 F (37.9 C)   Resp 20   SpO2 92%   Visual Acuity Right Eye Distance:   Left Eye Distance:   Bilateral Distance:    Right Eye Near:   Left Eye Near:    Bilateral Near:     Physical Exam Vitals reviewed.  Constitutional:      General: He is awake.     Appearance: Normal appearance. He is well-developed. He is not ill-appearing.     Comments: Very pleasant male appears stated age in no acute distress sitting comfortably in exam room  HENT:     Head: Normocephalic and atraumatic.     Right Ear: Ear canal and external ear normal. A middle ear effusion is present. Tympanic membrane is not erythematous or bulging.     Left Ear: Ear canal and external ear normal. A middle ear effusion is present. Tympanic membrane is not erythematous or bulging.     Nose: Nose normal.     Mouth/Throat:     Pharynx: Uvula midline. No oropharyngeal exudate, posterior oropharyngeal erythema or uvula swelling.  Cardiovascular:     Rate and Rhythm: Normal rate and regular rhythm.     Heart sounds: Normal heart sounds, S1 normal and S2 normal. No murmur heard. Pulmonary:     Effort: Pulmonary  effort is normal. No accessory muscle usage or respiratory distress.     Breath sounds: No stridor. Wheezing present. No rhonchi or rales.     Comments: Scattered wheezing worse in  left upper lung fields Abdominal:     General: Bowel sounds are normal.     Palpations: Abdomen is soft.     Tenderness: There is no abdominal tenderness.  Neurological:     Mental Status: He is alert.  Psychiatric:        Behavior: Behavior is cooperative.      UC Treatments / Results  Labs (all labs ordered are listed, but only abnormal results are displayed) Labs Reviewed - No data to display  EKG   Radiology DG Chest 2 View  Result Date: 08/20/2021 CLINICAL DATA:  66 year old male with cough, congestion, headache, ear fullness. Smoker. Multiple myeloma. EXAM: CHEST - 2 VIEW COMPARISON:  Chest radiographs 03/14/2016 and earlier. PET-CT 03/10/2021. FINDINGS: Right chest power port is new since 2018. Chronically large lung volumes, mildly hyperinflated. Mild eventration of the diaphragm, normal variant. Normal cardiac size and mediastinal contours. Visualized tracheal air column is within normal limits. Chronic increased pulmonary interstitial markings are stable since 2018. emphysema demonstrated on February PET-CT No pneumothorax, pulmonary edema, pleural effusion or acute pulmonary opacity. Osteopenia. Two level thoracic vertebral body augmentation since 2018. No acute osseous abnormality identified. Visible bowel gas pattern within normal limits. IMPRESSION: 1. Chronic Emphysema (ICD10-J43.9). No acute cardiopulmonary abnormality. 2. 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 Two level thoracic augmentation since 2018. Electronically Signed   By: Genevie Ann M.D.   On: 08/20/2021 09:57    Procedures Procedures (including critical care time)  Medications Ordered in UC Medications - No data to display  Initial Impression / Assessment and Plan / UC Course  I have reviewed the triage vital signs  and the nursing notes.  Pertinent labs & imaging results that were available during my care of the patient were reviewed by me and considered in my medical decision making (see chart for details).     Patient is well-appearing and nontoxic.  X-ray was obtained that showed chronic emphysema without acute cardiopulmonary abnormality.  Discussed that he likely has COPD related to smoking and current symptoms are related to a COPD flare.  We will start doxycycline 100 mg twice daily for 10 days.  He was instructed to avoid the sun while on this medication due to associated photosensitivity.  Encouraged him to use over-the-counter medications including Mucinex and Flonase for additional symptom relief.  He is to follow-up closely with his primary care provider.  Discussed that if he has any worsening symptoms he needs to go to the emergency room immediately including increased cough, shortness of breath, fever, nausea/vomiting interfering with oral intake.  Return precautions given.  Work excuse note provided.  Final Clinical Impressions(s) / UC Diagnoses   Final diagnoses:  COPD exacerbation ([place])  Acute cough  History of multiple myeloma     Discharge Instructions      Your x-ray showed emphysema which is likely related to your smoking (COPD).  I am concerned with your symptoms that you are having a COPD exacerbation.  Start doxycycline 100 mg twice daily for 10 days.  I recommend that you avoid being in the sun while on this medication as it can cause significant photosensitivity.  Continue your Mucinex.  Contact your oncologist regarding timing of your treatment.  If you have any worsening symptoms you need to go to the emergency room including high fever, chest pain, shortness of breath, worsening cough.     ED Prescriptions     Medication Sig Dispense Auth. Provider   doxycycline (VIBRAMYCIN) 100 MG capsule Take  1 capsule (100 mg total) by mouth 2 (two) times daily. 20 capsule Zymir Napoli,  Derry Skill, PA-C      PDMP not reviewed this encounter.   Terrilee Croak, PA-C 08/20/21 1016

## 2021-08-20 NOTE — Discharge Instructions (Addendum)
Your x-ray showed emphysema which is likely related to your smoking (COPD).  I am concerned with your symptoms that you are having a COPD exacerbation.  Start doxycycline 100 mg twice daily for 10 days.  I recommend that you avoid being in the sun while on this medication as it can cause significant photosensitivity.  Continue your Mucinex.  Contact your oncologist regarding timing of your treatment.  If you have any worsening symptoms you need to go to the emergency room including high fever, chest pain, shortness of breath, worsening cough.

## 2021-08-20 NOTE — ED Triage Notes (Addendum)
Pt here with cough, congestion, headache and ear fullness x 3 days. Pt states cough is productive and is coughing up yellow mucous. Pt offered medication for comfort here, but prefers to take fever reducer when he gets home. Pt is multiple myeloma pt and has an immunoglobulin tx on Thursday.

## 2021-08-24 DIAGNOSIS — Z09 Encounter for follow-up examination after completed treatment for conditions other than malignant neoplasm: Secondary | ICD-10-CM | POA: Diagnosis not present

## 2021-08-24 DIAGNOSIS — G620 Drug-induced polyneuropathy: Secondary | ICD-10-CM | POA: Diagnosis not present

## 2021-08-24 DIAGNOSIS — D801 Nonfamilial hypogammaglobulinemia: Secondary | ICD-10-CM | POA: Diagnosis not present

## 2021-08-24 DIAGNOSIS — T451X5A Adverse effect of antineoplastic and immunosuppressive drugs, initial encounter: Secondary | ICD-10-CM | POA: Diagnosis not present

## 2021-08-24 DIAGNOSIS — C9000 Multiple myeloma not having achieved remission: Secondary | ICD-10-CM | POA: Diagnosis not present

## 2021-08-25 DIAGNOSIS — G629 Polyneuropathy, unspecified: Secondary | ICD-10-CM | POA: Diagnosis not present

## 2021-08-25 DIAGNOSIS — R059 Cough, unspecified: Secondary | ICD-10-CM | POA: Diagnosis not present

## 2021-08-25 DIAGNOSIS — K047 Periapical abscess without sinus: Secondary | ICD-10-CM | POA: Diagnosis not present

## 2021-08-25 DIAGNOSIS — D801 Nonfamilial hypogammaglobulinemia: Secondary | ICD-10-CM | POA: Diagnosis not present

## 2021-08-25 DIAGNOSIS — K5792 Diverticulitis of intestine, part unspecified, without perforation or abscess without bleeding: Secondary | ICD-10-CM | POA: Diagnosis not present

## 2021-08-25 DIAGNOSIS — R5383 Other fatigue: Secondary | ICD-10-CM | POA: Diagnosis not present

## 2021-08-25 DIAGNOSIS — R062 Wheezing: Secondary | ICD-10-CM | POA: Diagnosis not present

## 2021-08-25 DIAGNOSIS — J069 Acute upper respiratory infection, unspecified: Secondary | ICD-10-CM | POA: Diagnosis not present

## 2021-08-25 DIAGNOSIS — C9000 Multiple myeloma not having achieved remission: Secondary | ICD-10-CM | POA: Diagnosis not present

## 2021-08-25 DIAGNOSIS — Z716 Tobacco abuse counseling: Secondary | ICD-10-CM | POA: Diagnosis not present

## 2021-08-25 DIAGNOSIS — Z720 Tobacco use: Secondary | ICD-10-CM | POA: Diagnosis not present

## 2021-08-25 DIAGNOSIS — D709 Neutropenia, unspecified: Secondary | ICD-10-CM | POA: Diagnosis not present

## 2021-08-25 DIAGNOSIS — Z9484 Stem cells transplant status: Secondary | ICD-10-CM | POA: Diagnosis not present

## 2021-08-25 DIAGNOSIS — C4491 Basal cell carcinoma of skin, unspecified: Secondary | ICD-10-CM | POA: Diagnosis not present

## 2021-08-29 DIAGNOSIS — Z5112 Encounter for antineoplastic immunotherapy: Secondary | ICD-10-CM | POA: Diagnosis not present

## 2021-08-29 DIAGNOSIS — Z9484 Stem cells transplant status: Secondary | ICD-10-CM | POA: Diagnosis not present

## 2021-08-29 DIAGNOSIS — Z1159 Encounter for screening for other viral diseases: Secondary | ICD-10-CM | POA: Diagnosis not present

## 2021-08-29 DIAGNOSIS — C9000 Multiple myeloma not having achieved remission: Secondary | ICD-10-CM | POA: Diagnosis not present

## 2021-09-07 DIAGNOSIS — R059 Cough, unspecified: Secondary | ICD-10-CM | POA: Diagnosis not present

## 2021-09-07 DIAGNOSIS — J069 Acute upper respiratory infection, unspecified: Secondary | ICD-10-CM | POA: Diagnosis not present

## 2021-09-07 DIAGNOSIS — R062 Wheezing: Secondary | ICD-10-CM | POA: Diagnosis not present

## 2021-09-07 DIAGNOSIS — G620 Drug-induced polyneuropathy: Secondary | ICD-10-CM | POA: Diagnosis not present

## 2021-09-07 DIAGNOSIS — C4491 Basal cell carcinoma of skin, unspecified: Secondary | ICD-10-CM | POA: Diagnosis not present

## 2021-09-07 DIAGNOSIS — Z09 Encounter for follow-up examination after completed treatment for conditions other than malignant neoplasm: Secondary | ICD-10-CM | POA: Diagnosis not present

## 2021-09-07 DIAGNOSIS — I82411 Acute embolism and thrombosis of right femoral vein: Secondary | ICD-10-CM | POA: Diagnosis not present

## 2021-09-07 DIAGNOSIS — Z9484 Stem cells transplant status: Secondary | ICD-10-CM | POA: Diagnosis not present

## 2021-09-07 DIAGNOSIS — G893 Neoplasm related pain (acute) (chronic): Secondary | ICD-10-CM | POA: Diagnosis not present

## 2021-09-07 DIAGNOSIS — Z1159 Encounter for screening for other viral diseases: Secondary | ICD-10-CM | POA: Diagnosis not present

## 2021-09-07 DIAGNOSIS — R5383 Other fatigue: Secondary | ICD-10-CM | POA: Diagnosis not present

## 2021-09-07 DIAGNOSIS — C9000 Multiple myeloma not having achieved remission: Secondary | ICD-10-CM | POA: Diagnosis not present

## 2021-09-07 DIAGNOSIS — Z5111 Encounter for antineoplastic chemotherapy: Secondary | ICD-10-CM | POA: Diagnosis not present

## 2021-09-07 DIAGNOSIS — D801 Nonfamilial hypogammaglobulinemia: Secondary | ICD-10-CM | POA: Diagnosis not present

## 2021-09-07 DIAGNOSIS — T451X5A Adverse effect of antineoplastic and immunosuppressive drugs, initial encounter: Secondary | ICD-10-CM | POA: Diagnosis not present

## 2021-09-08 DIAGNOSIS — Z5112 Encounter for antineoplastic immunotherapy: Secondary | ICD-10-CM | POA: Diagnosis not present

## 2021-09-08 DIAGNOSIS — Z1159 Encounter for screening for other viral diseases: Secondary | ICD-10-CM | POA: Diagnosis not present

## 2021-09-08 DIAGNOSIS — C9000 Multiple myeloma not having achieved remission: Secondary | ICD-10-CM | POA: Diagnosis not present

## 2021-09-08 DIAGNOSIS — Z9484 Stem cells transplant status: Secondary | ICD-10-CM | POA: Diagnosis not present

## 2021-09-21 DIAGNOSIS — Z5111 Encounter for antineoplastic chemotherapy: Secondary | ICD-10-CM | POA: Diagnosis not present

## 2021-09-21 DIAGNOSIS — C9000 Multiple myeloma not having achieved remission: Secondary | ICD-10-CM | POA: Diagnosis not present

## 2021-09-21 DIAGNOSIS — D801 Nonfamilial hypogammaglobulinemia: Secondary | ICD-10-CM | POA: Diagnosis not present

## 2021-09-21 DIAGNOSIS — C9002 Multiple myeloma in relapse: Secondary | ICD-10-CM | POA: Diagnosis not present

## 2021-09-21 DIAGNOSIS — G893 Neoplasm related pain (acute) (chronic): Secondary | ICD-10-CM | POA: Diagnosis not present

## 2021-09-22 DIAGNOSIS — Z9484 Stem cells transplant status: Secondary | ICD-10-CM | POA: Diagnosis not present

## 2021-09-22 DIAGNOSIS — D801 Nonfamilial hypogammaglobulinemia: Secondary | ICD-10-CM | POA: Diagnosis not present

## 2021-09-22 DIAGNOSIS — C9000 Multiple myeloma not having achieved remission: Secondary | ICD-10-CM | POA: Diagnosis not present

## 2021-09-22 DIAGNOSIS — Z5112 Encounter for antineoplastic immunotherapy: Secondary | ICD-10-CM | POA: Diagnosis not present

## 2021-10-04 DIAGNOSIS — R197 Diarrhea, unspecified: Secondary | ICD-10-CM | POA: Diagnosis not present

## 2021-10-04 DIAGNOSIS — J019 Acute sinusitis, unspecified: Secondary | ICD-10-CM | POA: Insufficient documentation

## 2021-10-04 DIAGNOSIS — J329 Chronic sinusitis, unspecified: Secondary | ICD-10-CM | POA: Insufficient documentation

## 2021-10-05 DIAGNOSIS — Z09 Encounter for follow-up examination after completed treatment for conditions other than malignant neoplasm: Secondary | ICD-10-CM | POA: Diagnosis not present

## 2021-10-05 DIAGNOSIS — T451X5A Adverse effect of antineoplastic and immunosuppressive drugs, initial encounter: Secondary | ICD-10-CM | POA: Diagnosis not present

## 2021-10-05 DIAGNOSIS — D801 Nonfamilial hypogammaglobulinemia: Secondary | ICD-10-CM | POA: Diagnosis not present

## 2021-10-05 DIAGNOSIS — I82411 Acute embolism and thrombosis of right femoral vein: Secondary | ICD-10-CM | POA: Diagnosis not present

## 2021-10-05 DIAGNOSIS — G620 Drug-induced polyneuropathy: Secondary | ICD-10-CM | POA: Diagnosis not present

## 2021-10-05 DIAGNOSIS — G893 Neoplasm related pain (acute) (chronic): Secondary | ICD-10-CM | POA: Diagnosis not present

## 2021-10-05 DIAGNOSIS — C9000 Multiple myeloma not having achieved remission: Secondary | ICD-10-CM | POA: Diagnosis not present

## 2021-10-06 DIAGNOSIS — Z5112 Encounter for antineoplastic immunotherapy: Secondary | ICD-10-CM | POA: Diagnosis not present

## 2021-10-06 DIAGNOSIS — C9000 Multiple myeloma not having achieved remission: Secondary | ICD-10-CM | POA: Diagnosis not present

## 2021-10-06 DIAGNOSIS — Z9484 Stem cells transplant status: Secondary | ICD-10-CM | POA: Diagnosis not present

## 2021-10-06 DIAGNOSIS — Z95828 Presence of other vascular implants and grafts: Secondary | ICD-10-CM | POA: Diagnosis not present

## 2021-10-07 DIAGNOSIS — C9000 Multiple myeloma not having achieved remission: Secondary | ICD-10-CM | POA: Diagnosis not present

## 2021-10-19 DIAGNOSIS — G620 Drug-induced polyneuropathy: Secondary | ICD-10-CM | POA: Diagnosis not present

## 2021-10-19 DIAGNOSIS — Z09 Encounter for follow-up examination after completed treatment for conditions other than malignant neoplasm: Secondary | ICD-10-CM | POA: Diagnosis not present

## 2021-10-19 DIAGNOSIS — C9002 Multiple myeloma in relapse: Secondary | ICD-10-CM | POA: Diagnosis not present

## 2021-10-19 DIAGNOSIS — T451X5A Adverse effect of antineoplastic and immunosuppressive drugs, initial encounter: Secondary | ICD-10-CM | POA: Diagnosis not present

## 2021-10-19 DIAGNOSIS — C9000 Multiple myeloma not having achieved remission: Secondary | ICD-10-CM | POA: Diagnosis not present

## 2021-10-20 DIAGNOSIS — Z792 Long term (current) use of antibiotics: Secondary | ICD-10-CM | POA: Diagnosis not present

## 2021-10-20 DIAGNOSIS — Z79899 Other long term (current) drug therapy: Secondary | ICD-10-CM | POA: Diagnosis not present

## 2021-10-20 DIAGNOSIS — E041 Nontoxic single thyroid nodule: Secondary | ICD-10-CM | POA: Diagnosis not present

## 2021-10-20 DIAGNOSIS — Z7901 Long term (current) use of anticoagulants: Secondary | ICD-10-CM | POA: Diagnosis not present

## 2021-10-20 DIAGNOSIS — J3489 Other specified disorders of nose and nasal sinuses: Secondary | ICD-10-CM | POA: Diagnosis not present

## 2021-10-20 DIAGNOSIS — J392 Other diseases of pharynx: Secondary | ICD-10-CM | POA: Diagnosis not present

## 2021-10-20 DIAGNOSIS — C9000 Multiple myeloma not having achieved remission: Secondary | ICD-10-CM | POA: Diagnosis not present

## 2021-10-20 DIAGNOSIS — Z9484 Stem cells transplant status: Secondary | ICD-10-CM | POA: Diagnosis not present

## 2021-10-20 DIAGNOSIS — J329 Chronic sinusitis, unspecified: Secondary | ICD-10-CM | POA: Diagnosis not present

## 2021-10-20 DIAGNOSIS — E042 Nontoxic multinodular goiter: Secondary | ICD-10-CM | POA: Diagnosis not present

## 2021-10-20 DIAGNOSIS — Z5112 Encounter for antineoplastic immunotherapy: Secondary | ICD-10-CM | POA: Diagnosis not present

## 2021-10-20 DIAGNOSIS — I10 Essential (primary) hypertension: Secondary | ICD-10-CM | POA: Diagnosis not present

## 2021-10-20 DIAGNOSIS — D649 Anemia, unspecified: Secondary | ICD-10-CM | POA: Diagnosis not present

## 2021-10-20 DIAGNOSIS — Z1159 Encounter for screening for other viral diseases: Secondary | ICD-10-CM | POA: Diagnosis not present

## 2021-10-20 DIAGNOSIS — Z9221 Personal history of antineoplastic chemotherapy: Secondary | ICD-10-CM | POA: Diagnosis not present

## 2021-10-24 DIAGNOSIS — Z1211 Encounter for screening for malignant neoplasm of colon: Secondary | ICD-10-CM | POA: Diagnosis not present

## 2021-11-01 DIAGNOSIS — L989 Disorder of the skin and subcutaneous tissue, unspecified: Secondary | ICD-10-CM | POA: Diagnosis not present

## 2021-11-02 DIAGNOSIS — C9000 Multiple myeloma not having achieved remission: Secondary | ICD-10-CM | POA: Diagnosis not present

## 2021-11-02 DIAGNOSIS — Z9484 Stem cells transplant status: Secondary | ICD-10-CM | POA: Diagnosis not present

## 2021-11-02 DIAGNOSIS — G620 Drug-induced polyneuropathy: Secondary | ICD-10-CM | POA: Diagnosis not present

## 2021-11-02 DIAGNOSIS — Z09 Encounter for follow-up examination after completed treatment for conditions other than malignant neoplasm: Secondary | ICD-10-CM | POA: Diagnosis not present

## 2021-11-02 DIAGNOSIS — D801 Nonfamilial hypogammaglobulinemia: Secondary | ICD-10-CM | POA: Diagnosis not present

## 2021-11-02 DIAGNOSIS — T451X5A Adverse effect of antineoplastic and immunosuppressive drugs, initial encounter: Secondary | ICD-10-CM | POA: Diagnosis not present

## 2021-11-03 DIAGNOSIS — M791 Myalgia, unspecified site: Secondary | ICD-10-CM | POA: Diagnosis not present

## 2021-11-03 DIAGNOSIS — Z1159 Encounter for screening for other viral diseases: Secondary | ICD-10-CM | POA: Diagnosis not present

## 2021-11-03 DIAGNOSIS — Z5112 Encounter for antineoplastic immunotherapy: Secondary | ICD-10-CM | POA: Diagnosis not present

## 2021-11-03 DIAGNOSIS — G8929 Other chronic pain: Secondary | ICD-10-CM | POA: Diagnosis not present

## 2021-11-03 DIAGNOSIS — C9000 Multiple myeloma not having achieved remission: Secondary | ICD-10-CM | POA: Diagnosis not present

## 2021-11-03 DIAGNOSIS — G629 Polyneuropathy, unspecified: Secondary | ICD-10-CM | POA: Diagnosis not present

## 2021-11-03 DIAGNOSIS — D801 Nonfamilial hypogammaglobulinemia: Secondary | ICD-10-CM | POA: Diagnosis not present

## 2021-11-03 DIAGNOSIS — Z9484 Stem cells transplant status: Secondary | ICD-10-CM | POA: Diagnosis not present

## 2021-11-16 ENCOUNTER — Encounter: Payer: Self-pay | Admitting: Internal Medicine

## 2021-11-16 DIAGNOSIS — Z09 Encounter for follow-up examination after completed treatment for conditions other than malignant neoplasm: Secondary | ICD-10-CM | POA: Diagnosis not present

## 2021-11-16 DIAGNOSIS — D801 Nonfamilial hypogammaglobulinemia: Secondary | ICD-10-CM | POA: Diagnosis not present

## 2021-11-16 DIAGNOSIS — G620 Drug-induced polyneuropathy: Secondary | ICD-10-CM | POA: Diagnosis not present

## 2021-11-16 DIAGNOSIS — T451X5A Adverse effect of antineoplastic and immunosuppressive drugs, initial encounter: Secondary | ICD-10-CM | POA: Diagnosis not present

## 2021-11-16 DIAGNOSIS — Z9221 Personal history of antineoplastic chemotherapy: Secondary | ICD-10-CM | POA: Diagnosis not present

## 2021-11-16 DIAGNOSIS — R197 Diarrhea, unspecified: Secondary | ICD-10-CM | POA: Diagnosis not present

## 2021-11-16 DIAGNOSIS — C9001 Multiple myeloma in remission: Secondary | ICD-10-CM | POA: Diagnosis not present

## 2021-11-16 DIAGNOSIS — C9000 Multiple myeloma not having achieved remission: Secondary | ICD-10-CM | POA: Diagnosis not present

## 2021-11-16 DIAGNOSIS — Z23 Encounter for immunization: Secondary | ICD-10-CM | POA: Diagnosis not present

## 2021-11-16 DIAGNOSIS — E876 Hypokalemia: Secondary | ICD-10-CM | POA: Diagnosis not present

## 2021-11-17 DIAGNOSIS — Z5112 Encounter for antineoplastic immunotherapy: Secondary | ICD-10-CM | POA: Diagnosis not present

## 2021-11-17 DIAGNOSIS — Z1159 Encounter for screening for other viral diseases: Secondary | ICD-10-CM | POA: Diagnosis not present

## 2021-11-17 DIAGNOSIS — Z9484 Stem cells transplant status: Secondary | ICD-10-CM | POA: Diagnosis not present

## 2021-11-17 DIAGNOSIS — C9000 Multiple myeloma not having achieved remission: Secondary | ICD-10-CM | POA: Diagnosis not present

## 2021-11-17 DIAGNOSIS — D801 Nonfamilial hypogammaglobulinemia: Secondary | ICD-10-CM | POA: Diagnosis not present

## 2021-11-29 DIAGNOSIS — C9001 Multiple myeloma in remission: Secondary | ICD-10-CM | POA: Diagnosis not present

## 2021-11-29 DIAGNOSIS — Z5111 Encounter for antineoplastic chemotherapy: Secondary | ICD-10-CM | POA: Diagnosis not present

## 2021-11-29 DIAGNOSIS — D801 Nonfamilial hypogammaglobulinemia: Secondary | ICD-10-CM | POA: Diagnosis not present

## 2021-11-29 DIAGNOSIS — C9000 Multiple myeloma not having achieved remission: Secondary | ICD-10-CM | POA: Diagnosis not present

## 2021-11-29 DIAGNOSIS — G620 Drug-induced polyneuropathy: Secondary | ICD-10-CM | POA: Diagnosis not present

## 2021-11-29 DIAGNOSIS — E860 Dehydration: Secondary | ICD-10-CM | POA: Diagnosis not present

## 2021-11-29 DIAGNOSIS — T451X5A Adverse effect of antineoplastic and immunosuppressive drugs, initial encounter: Secondary | ICD-10-CM | POA: Diagnosis not present

## 2021-11-29 DIAGNOSIS — Z9484 Stem cells transplant status: Secondary | ICD-10-CM | POA: Diagnosis not present

## 2021-12-01 DIAGNOSIS — C9000 Multiple myeloma not having achieved remission: Secondary | ICD-10-CM | POA: Diagnosis not present

## 2021-12-01 DIAGNOSIS — Z5112 Encounter for antineoplastic immunotherapy: Secondary | ICD-10-CM | POA: Diagnosis not present

## 2021-12-01 DIAGNOSIS — Z1159 Encounter for screening for other viral diseases: Secondary | ICD-10-CM | POA: Diagnosis not present

## 2021-12-01 DIAGNOSIS — Z9484 Stem cells transplant status: Secondary | ICD-10-CM | POA: Diagnosis not present

## 2021-12-06 DIAGNOSIS — L820 Inflamed seborrheic keratosis: Secondary | ICD-10-CM | POA: Diagnosis not present

## 2021-12-06 DIAGNOSIS — L821 Other seborrheic keratosis: Secondary | ICD-10-CM | POA: Diagnosis not present

## 2021-12-09 ENCOUNTER — Ambulatory Visit (INDEPENDENT_AMBULATORY_CARE_PROVIDER_SITE_OTHER): Payer: PPO | Admitting: Gastroenterology

## 2021-12-09 ENCOUNTER — Encounter: Payer: Self-pay | Admitting: Gastroenterology

## 2021-12-09 VITALS — BP 142/79 | HR 69 | Temp 98.1°F | Ht 70.0 in | Wt 208.0 lb

## 2021-12-09 DIAGNOSIS — R195 Other fecal abnormalities: Secondary | ICD-10-CM | POA: Insufficient documentation

## 2021-12-09 NOTE — H&P (View-Only) (Signed)
GI Office Note    Referring Provider: Celene Squibb, MD Primary Care Physician:  Celene Squibb, MD  Primary Gastroenterologist: Garfield Cornea, MD   Chief Complaint   Chief Complaint  Patient presents with   Colonoscopy    Positive cologuard     History of Present Illness   Craig Rivera is a 66 y.o. 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 Patient has history of colonoscopy in 2012 with benign hyperplastic polyps, benign prolapse type polyp removed at that time. Recommended five year follow up colonoscopy for FH of colon polyps.   Patient states he has had some issues with recurrent infections after his monthly chemo but this is better now that he has been receiving IVIG. He has some abdominal pain couple of months ago, thought maybe it was diverticulitis but it has since resolved. At baseline, BMs move daily. Occasional hard stool in setting of fentanyl patch and oxycodone. He states he never came back for colonoscopy because he didn't want to drink the big jug. He denies overt GI bleeding. No abdominal pain. Appetite is good. Occasional heartburn. No vomiting. No dysphagia.    Medications   Current Outpatient Medications  Medication Sig Dispense Refill   acetaminophen (TYLENOL) 325 MG tablet Take 650 mg by mouth every 6 (six) hours as needed for mild pain.      acyclovir (ZOVIRAX) 200 MG capsule TAKE 2 CAPSULES BY MOUTH TWICE A DAY (Patient taking differently: Take 200 mg by mouth 2 (two) times daily.) 360 capsule 1   amLODipine (NORVASC) 10 MG tablet Take 10 mg by mouth daily.      cyanocobalamin (VITAMIN B12) 1000 MCG tablet Take by mouth.     dexamethasone (DECADRON) 4 MG tablet PLEASE SEE ATTACHED FOR DETAILED DIRECTIONS      fentaNYL (DURAGESIC - DOSED MCG/HR) 75 MCG/HR Place 1 patch (75 mcg total) onto the skin every other day. 15 patch 0   fluticasone (FLONASE) 50 MCG/ACT nasal spray Place into the nose.     furosemide (LASIX) 20 MG tablet TAKE HALF A TABLET (10 MG TOTAL) BY MOUTH DAILY AS NEEDED FOR SWELLING.     ondansetron (ZOFRAN) 8 MG tablet TAKE 1 TABLET (8 MG TOTAL) BY MOUTH EVERY TWELVE (12) HOURS AS NEEDED FOR NAUSEA.     oxyCODONE (OXY IR/ROXICODONE) 5 MG immediate release tablet Take 1 tablet (5 mg total) by mouth every 4 (four) hours as needed. pain 30 tablet 0   pantoprazole (PROTONIX) 40 MG tablet Take 1 tablet by mouth daily.     POMALYST 3 MG capsule      potassium chloride (MICRO-K) 10 MEQ CR capsule Take by mouth.     tamsulosin (FLOMAX) 0.4 MG CAPS capsule Take 1 tablet by mouth daily.     XARELTO 10 MG TABS tablet Take 1 tablet by mouth daily.     No current facility-administered medications for this visit.    Allergies   Allergies as of 12/09/2021 - Review Complete 12/09/2021  Allergen Reaction Noted   Penicillins Diarrhea 08/29/2010   Ceclor [cefaclor] Diarrhea and Rash 08/29/2010   Lenalidomide Rash 04/21/2014   Pseudoephedrine hcl Rash 12/15/2020    Past Medical History   Past Medical History:  Diagnosis Date   Back  pain    DVT (deep venous thrombosis) (HCC)    HTN (hypertension)    Hypercholesterolemia    Multiple myeloma (HCC)    Sciatic nerve pain     Past Surgical History   Past Surgical History:  Procedure Laterality Date   BONE MARROW TRANSPLANT     CATARACT EXTRACTION W/PHACO Right 08/11/2014   Procedure: CATARACT EXTRACTION PHACO AND INTRAOCULAR LENS PLACEMENT ([place]);  Surgeon: Williams Che, MD;  Location: [facility];  Service: Ophthalmology;  Laterality: Right;  CDE 4.88   CATARACT EXTRACTION W/PHACO Left 10/26/2014   Procedure: CATARACT EXTRACTION PHACO AND INTRAOCULAR LENS PLACEMENT LEFT EYE CDE=8.64;  Surgeon: Williams Che, MD;  Location: [facility];  Service:  Ophthalmology;  Laterality: Left;   COLONOSCOPY  09/12/2010   Procedure: COLONOSCOPY;  Surgeon: Daneil Dolin, MD;  Location: [facility];  Service: Endoscopy;  Laterality: N/A;   inguinal hernia repair bilateral     as child    KNEE SURGERY Right    arthroscopy   TONSILLECTOMY      Past Family History   Family History  Problem Relation Age of Onset   Colon polyps Father        passed away, Alzhemiers   Colon polyps Sister    Migraines Mother        deceased    Past Social History   Social History   Socioeconomic History   Marital status: Married    Spouse name: Not on file   Number of children: Not on file   Years of education: Not on file   Highest education level: Not on file  Occupational History   Not on file  Tobacco Use   Smoking status: Former    Packs/day: 0.50    Years: 40.00    Total pack years: 20.00    Types: Cigarettes    Quit date: 09/08/2021    Years since quitting: 0.2   Smokeless tobacco: Never  Substance and Sexual Activity   Alcohol use: No   Drug use: No   Sexual activity: Yes    Birth control/protection: None  Other Topics Concern   Not on file  Social History Narrative   Not on file   Social Determinants of Health   Financial Resource Strain: Not on file  Food Insecurity: Not on file  Transportation Needs: Not on file  Physical Activity: Not on file  Stress: Not on file  Social Connections: Not on file  Intimate Partner Violence: Not on file    Review of Systems   General: Negative for anorexia, weight loss, fever, chills, fatigue, weakness. See hpi Eyes: Negative for vision changes.  ENT: Negative for hoarseness, difficulty swallowing , nasal congestion. CV: Negative for chest pain, angina, palpitations, dyspnea on exertion, +peripheral edema.  Respiratory: Negative for dyspnea at rest, dyspnea on exertion, cough, sputum, wheezing.  GI: See history of present illness. GU:  Negative for dysuria, hematuria, urinary  incontinence, urinary frequency, nocturnal urination.  MS: Negative for joint pain, low back pain.  Derm: Negative for rash or itching.  Neuro: Negative for weakness, abnormal sensation, seizure, frequent headaches, memory loss,  confusion.  Psych: Negative for anxiety, depression, suicidal ideation, hallucinations.  Endo: Negative for unusual weight change.  Heme: Negative for bruising or bleeding. Allergy: Negative for rash or hives.  Physical Exam   BP (!) 142/79 (BP Location: Right Arm, Patient Position: Sitting, Cuff Size: Normal)   Pulse 69   Temp 98.1 F (36.7 C) (Oral)  Ht _0  (1.778 m)   Wt 208 lb (94.3 kg)   SpO2 96%   BMI 29.84 kg/m    General: Well-nourished, well-developed in no acute distress.  Head: Normocephalic, atraumatic.   Eyes: Conjunctiva pink, no icterus. Mouth: Oropharyngeal mucosa moist and pink , no lesions erythema or exudate. Neck: Supple without thyromegaly, masses, or lymphadenopathy.  Lungs: Clear to auscultation bilaterally.  Heart: Regular rate and rhythm, no murmurs rubs or gallops.  Abdomen: Bowel sounds are normal, nontender, nondistended, no hepatosplenomegaly or masses,  no abdominal bruits or hernia, no rebound or guarding.   Rectal: not performed Extremities: trace bilateral lower extremity edema. No clubbing or deformities.  Neuro: Alert and oriented x 4 , grossly normal neurologically.  Skin: Warm and dry, no rash or jaundice.   Psych: Alert and cooperative, normal mood and affect.  Labs   Labs from November 29, 2021: White blood cell count 3900, hemoglobin 13.5, platelets 186,000, sodium 138, potassium 3.7, creatinine 1.01, albumin 3.4, total bilirubin 0.7, AST 19, ALT 22, alkaline phosphatase 48.  Imaging Studies   No results found.  Assessment   Positive Cologuard: no overt GI bleeding. Hgb normal. Last colonoscopy 2012, benign polyps with FH of colon polyps advised colonoscopy in 2017. Patient states he didn't come back  due to bowel prep requirements. He is open to colonoscopy but wants to avoid large volume prep. He is on Xarelto for history of DVT couple of years ago. He said he may have had PE but he is not sure. Will need to hold Xarelto for 48 hours prior to procedure. He is aware and agreeable.    PLAN   Colonoscopy in near future. ASA 3.  I have discussed the risks, alternatives, benefits with regards to but not limited to the risk of reaction to medication, bleeding, infection, perforation and the patient is agreeable to proceed. Written consent to be obtained. Hold Xarelto 48 hours prior to procedure.  Will given extra bisacodyl 18m daily for 3 days prior to his colonoscopy prep to augment bowel preparation.   LLaureen Ochs LBobby Rumpf [facility]

## 2021-12-09 NOTE — Progress Notes (Signed)
   GI Office Note    Referring Provider: Hall, John Z, MD Primary Care Physician:  Hall, John Z, MD  Primary Gastroenterologist: Michael Rourk, MD   Chief Complaint   Chief Complaint  Patient presents with   Colonoscopy    Positive cologuard     History of Present Illness   Craig Rivera is a 65 y.o. 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 Patient has history of colonoscopy in 2012 with benign hyperplastic polyps, benign prolapse type polyp removed at that time. Recommended five year follow up colonoscopy for FH of colon polyps.   Patient states he has had some issues with recurrent infections after his monthly chemo but this is better now that he has been receiving IVIG. He has some abdominal pain couple of months ago, thought maybe it was diverticulitis but it has since resolved. At baseline, BMs move daily. Occasional hard stool in setting of fentanyl patch and oxycodone. He states he never came back for colonoscopy because he didn't want to drink the big jug. He denies overt GI bleeding. No abdominal pain. Appetite is good. Occasional heartburn. No vomiting. No dysphagia.    Medications   Current Outpatient Medications  Medication Sig Dispense Refill   acetaminophen (TYLENOL) 325 MG tablet Take 650 mg by mouth every 6 (six) hours as needed for mild pain.      acyclovir (ZOVIRAX) 200 MG capsule TAKE 2 CAPSULES BY MOUTH TWICE A DAY (Patient taking differently: Take 200 mg by mouth 2 (two) times daily.) 360 capsule 1   amLODipine (NORVASC) 10 MG tablet Take 10 mg by mouth daily.      cyanocobalamin (VITAMIN B12) 1000 MCG tablet Take by mouth.     dexamethasone (DECADRON) 4 MG tablet PLEASE SEE ATTACHED FOR DETAILED DIRECTIONS      fentaNYL (DURAGESIC - DOSED MCG/HR) 75 MCG/HR Place 1 patch (75 mcg total) onto the skin every other day. 15 patch 0   fluticasone (FLONASE) 50 MCG/ACT nasal spray Place into the nose.     furosemide (LASIX) 20 MG tablet TAKE HALF A TABLET (10 MG TOTAL) BY MOUTH DAILY AS NEEDED FOR SWELLING.     ondansetron (ZOFRAN) 8 MG tablet TAKE 1 TABLET (8 MG TOTAL) BY MOUTH EVERY TWELVE (12) HOURS AS NEEDED FOR NAUSEA.     oxyCODONE (OXY IR/ROXICODONE) 5 MG immediate release tablet Take 1 tablet (5 mg total) by mouth every 4 (four) hours as needed. pain 30 tablet 0   pantoprazole (PROTONIX) 40 MG tablet Take 1 tablet by mouth daily.     POMALYST 3 MG capsule      potassium chloride (MICRO-K) 10 MEQ CR capsule Take by mouth.     tamsulosin (FLOMAX) 0.4 MG CAPS capsule Take 1 tablet by mouth daily.     XARELTO 10 MG TABS tablet Take 1 tablet by mouth daily.     No current facility-administered medications for this visit.    Allergies   Allergies as of 12/09/2021 - Review Complete 12/09/2021  Allergen Reaction Noted   Penicillins Diarrhea 08/29/2010   Ceclor [cefaclor] Diarrhea and Rash 08/29/2010   Lenalidomide Rash 04/21/2014   Pseudoephedrine hcl Rash 12/15/2020    Past Medical History   Past Medical History:  Diagnosis Date   Back   pain    DVT (deep venous thrombosis) (HCC)    HTN (hypertension)    Hypercholesterolemia    Multiple myeloma (HCC)    Sciatic nerve pain     Past Surgical History   Past Surgical History:  Procedure Laterality Date   BONE MARROW TRANSPLANT     CATARACT EXTRACTION W/PHACO Right 08/11/2014   Procedure: CATARACT EXTRACTION PHACO AND INTRAOCULAR LENS PLACEMENT ([facility]);  Surgeon: Carroll F Haines, MD;  Location: [facility];  Service: Ophthalmology;  Laterality: Right;  CDE 4.88   CATARACT EXTRACTION W/PHACO Left 10/26/2014   Procedure: CATARACT EXTRACTION PHACO AND INTRAOCULAR LENS PLACEMENT LEFT EYE CDE=8.64;  Surgeon: Carroll F Haines, MD;  Location: [facility];  Service:  Ophthalmology;  Laterality: Left;   COLONOSCOPY  09/12/2010   Procedure: COLONOSCOPY;  Surgeon: Robert M Rourk, MD;  Location: [facility];  Service: Endoscopy;  Laterality: N/A;   inguinal hernia repair bilateral     as child    KNEE SURGERY Right    arthroscopy   TONSILLECTOMY      Past Family History   Family History  Problem Relation Age of Onset   Colon polyps Father        passed away, Alzhemiers   Colon polyps Sister    Migraines Mother        deceased    Past Social History   Social History   Socioeconomic History   Marital status: Married    Spouse name: Not on file   Number of children: Not on file   Years of education: Not on file   Highest education level: Not on file  Occupational History   Not on file  Tobacco Use   Smoking status: Former    Packs/day: 0.50    Years: 40.00    Total pack years: 20.00    Types: Cigarettes    Quit date: 09/08/2021    Years since quitting: 0.2   Smokeless tobacco: Never  Substance and Sexual Activity   Alcohol use: No   Drug use: No   Sexual activity: Yes    Birth control/protection: None  Other Topics Concern   Not on file  Social History Narrative   Not on file   Social Determinants of Health   Financial Resource Strain: Not on file  Food Insecurity: Not on file  Transportation Needs: Not on file  Physical Activity: Not on file  Stress: Not on file  Social Connections: Not on file  Intimate Partner Violence: Not on file    Review of Systems   General: Negative for anorexia, weight loss, fever, chills, fatigue, weakness. See hpi Eyes: Negative for vision changes.  ENT: Negative for hoarseness, difficulty swallowing , nasal congestion. CV: Negative for chest pain, angina, palpitations, dyspnea on exertion, +peripheral edema.  Respiratory: Negative for dyspnea at rest, dyspnea on exertion, cough, sputum, wheezing.  GI: See history of present illness. GU:  Negative for dysuria, hematuria, urinary  incontinence, urinary frequency, nocturnal urination.  MS: Negative for joint pain, low back pain.  Derm: Negative for rash or itching.  Neuro: Negative for weakness, abnormal sensation, seizure, frequent headaches, memory loss,  confusion.  Psych: Negative for anxiety, depression, suicidal ideation, hallucinations.  Endo: Negative for unusual weight change.  Heme: Negative for bruising or bleeding. Allergy: Negative for rash or hives.  Physical Exam   BP (!) 142/79 (BP Location: Right Arm, Patient Position: Sitting, Cuff Size: Normal)   Pulse 69   Temp 98.1 F (36.7 C) (Oral)     Ht 5' 10" (1.778 m)   Wt 208 lb (94.3 kg)   SpO2 96%   BMI 29.84 kg/m    General: Well-nourished, well-developed in no acute distress.  Head: Normocephalic, atraumatic.   Eyes: Conjunctiva pink, no icterus. Mouth: Oropharyngeal mucosa moist and pink , no lesions erythema or exudate. Neck: Supple without thyromegaly, masses, or lymphadenopathy.  Lungs: Clear to auscultation bilaterally.  Heart: Regular rate and rhythm, no murmurs rubs or gallops.  Abdomen: Bowel sounds are normal, nontender, nondistended, no hepatosplenomegaly or masses,  no abdominal bruits or hernia, no rebound or guarding.   Rectal: not performed Extremities: trace bilateral lower extremity edema. No clubbing or deformities.  Neuro: Alert and oriented x 4 , grossly normal neurologically.  Skin: Warm and dry, no rash or jaundice.   Psych: Alert and cooperative, normal mood and affect.  Labs   Labs from November 29, 2021: White blood cell count 3900, hemoglobin 13.5, platelets 186,000, sodium 138, potassium 3.7, creatinine 1.01, albumin 3.4, total bilirubin 0.7, AST 19, ALT 22, alkaline phosphatase 48.  Imaging Studies   No results found.  Assessment   Positive Cologuard: no overt GI bleeding. Hgb normal. Last colonoscopy 2012, benign polyps with FH of colon polyps advised colonoscopy in 2017. Patient states he didn't come back  due to bowel prep requirements. He is open to colonoscopy but wants to avoid large volume prep. He is on Xarelto for history of DVT couple of years ago. He said he may have had PE but he is not sure. Will need to hold Xarelto for 48 hours prior to procedure. He is aware and agreeable.    PLAN   Colonoscopy in near future. ASA 3.  I have discussed the risks, alternatives, benefits with regards to but not limited to the risk of reaction to medication, bleeding, infection, perforation and the patient is agreeable to proceed. Written consent to be obtained. Hold Xarelto 48 hours prior to procedure.  Will given extra bisacodyl 10mg daily for 3 days prior to his colonoscopy prep to augment bowel preparation.   Herman Fiero S. Ezri Landers, MHS, PA-C [facility]  

## 2021-12-09 NOTE — Patient Instructions (Signed)
Colonoscopy to be scheduled. 

## 2021-12-12 ENCOUNTER — Telehealth (INDEPENDENT_AMBULATORY_CARE_PROVIDER_SITE_OTHER): Payer: Self-pay | Admitting: *Deleted

## 2021-12-12 NOTE — Telephone Encounter (Signed)
LMOVM to call back to schedule TCS with Dr. Gala Romney or Dr. Abbey Chatters, short volume prep, give bisacodyl '10mg'$  QD x 3 days before prep, hold xarelto 48 hrs prior

## 2021-12-13 ENCOUNTER — Encounter: Payer: Self-pay | Admitting: *Deleted

## 2021-12-13 MED ORDER — PEG 3350-KCL-NA BICARB-NACL 420 G PO SOLR: 4000.0000 mL | Freq: Once | ORAL | 0 refills | Status: AC

## 2021-12-13 MED ORDER — CLENPIQ 10-3.5-12 MG-GM -GM/175ML PO SOLN: 1.0000 | ORAL | 0 refills | Status: DC

## 2021-12-13 NOTE — Telephone Encounter (Signed)
Pt called back and states he has to go with the "gallon" prep. New prep sent to pharmacy. New instructions mailed to pt.

## 2021-12-13 NOTE — Addendum Note (Signed)
Addended by: Madelin Rear on: 12/13/2021 01:12 PM   Modules accepted: Orders

## 2021-12-13 NOTE — Telephone Encounter (Signed)
Pt has been scheduled for 01/06/22, Friday at 10 am. Instructions and pre-op date mailed. Prep sent to pharmacy. Pt instructed to call back in short volume prep cost too much.

## 2021-12-14 ENCOUNTER — Encounter: Payer: Self-pay | Admitting: *Deleted

## 2021-12-15 DIAGNOSIS — E042 Nontoxic multinodular goiter: Secondary | ICD-10-CM | POA: Diagnosis not present

## 2021-12-15 DIAGNOSIS — E041 Nontoxic single thyroid nodule: Secondary | ICD-10-CM | POA: Diagnosis not present

## 2021-12-27 DIAGNOSIS — C9000 Multiple myeloma not having achieved remission: Secondary | ICD-10-CM | POA: Diagnosis not present

## 2021-12-27 DIAGNOSIS — D801 Nonfamilial hypogammaglobulinemia: Secondary | ICD-10-CM | POA: Diagnosis not present

## 2021-12-27 DIAGNOSIS — Z1159 Encounter for screening for other viral diseases: Secondary | ICD-10-CM | POA: Diagnosis not present

## 2021-12-27 DIAGNOSIS — C9001 Multiple myeloma in remission: Secondary | ICD-10-CM | POA: Diagnosis not present

## 2021-12-27 DIAGNOSIS — T451X5A Adverse effect of antineoplastic and immunosuppressive drugs, initial encounter: Secondary | ICD-10-CM | POA: Diagnosis not present

## 2021-12-27 DIAGNOSIS — G893 Neoplasm related pain (acute) (chronic): Secondary | ICD-10-CM | POA: Diagnosis not present

## 2021-12-27 DIAGNOSIS — Z9484 Stem cells transplant status: Secondary | ICD-10-CM | POA: Diagnosis not present

## 2021-12-27 DIAGNOSIS — Z5111 Encounter for antineoplastic chemotherapy: Secondary | ICD-10-CM | POA: Diagnosis not present

## 2021-12-27 DIAGNOSIS — G620 Drug-induced polyneuropathy: Secondary | ICD-10-CM | POA: Diagnosis not present

## 2021-12-28 DIAGNOSIS — C9000 Multiple myeloma not having achieved remission: Secondary | ICD-10-CM | POA: Diagnosis not present

## 2021-12-28 DIAGNOSIS — D801 Nonfamilial hypogammaglobulinemia: Secondary | ICD-10-CM | POA: Diagnosis not present

## 2021-12-28 DIAGNOSIS — Z9484 Stem cells transplant status: Secondary | ICD-10-CM | POA: Diagnosis not present

## 2021-12-28 DIAGNOSIS — Z1159 Encounter for screening for other viral diseases: Secondary | ICD-10-CM | POA: Diagnosis not present

## 2021-12-28 DIAGNOSIS — Z5111 Encounter for antineoplastic chemotherapy: Secondary | ICD-10-CM | POA: Diagnosis not present

## 2022-01-03 NOTE — Patient Instructions (Signed)
Craig Rivera  01/03/2022     '@PREFPERIOPPHARMACY'$ @   Your procedure is scheduled on  01/06/2022.   Report to [facility] at  0800  A.M.   Call this number if you have problems the morning of surgery:  [phone]  If you experience any cold or flu symptoms such as cough, fever, chills, shortness of breath, etc. between now and your scheduled surgery, please notify us at the above number.   Remember:  Follow the diet and prep instructions given to you by the office.       Your last dose of xarelto should be on 01/03/2022.     Take these medicines the morning of surgery with A SIP OF WATER       amlodipine, zofran (if needed), oxycodone (if needed), protonix, flomax.     Do not wear jewelry, make-up or nail polish.  Do not wear lotions, powders, or perfumes, or deodorant.  Do not shave 48 hours prior to surgery.  Men may shave face and neck.  Do not bring valuables to the hospital.  [facility] is not responsible for any belongings or valuables.  Contacts, dentures or bridgework may not be worn into surgery.  Leave your suitcase in the car.  After surgery it may be brought to your room.  For patients admitted to the hospital, discharge time will be determined by your treatment team.  Patients discharged the day of surgery will not be allowed to drive home and must have someone with them for 24 hours.    Special instructions:   DO NOT smoke tobacco or vape for 24 hours before your procedure.  Please read over the following fact sheets that you were given. Anesthesia Post-op Instructions and Care and Recovery After Surgery      Colonoscopy, Adult, Care After The following information offers guidance on how to care for yourself after your procedure. Your health care provider may also give you more specific instructions. If you have problems or questions, contact your health care provider. What can I expect after the procedure? After the procedure, it is common  to have: A small amount of blood in your stool for 24 hours after the procedure. Some gas. Mild cramping or bloating of your abdomen. Follow these instructions at home: Eating and drinking  Drink enough fluid to keep your urine pale yellow. Follow instructions from your health care provider about eating or drinking restrictions. Resume your normal diet as told by your health care provider. Avoid heavy or fried foods that are hard to digest. Activity Rest as told by your health care provider. Avoid sitting for a long time without moving. Get up to take short walks every 1-2 hours. This is important to improve blood flow and breathing. Ask for help if you feel weak or unsteady. Return to your normal activities as told by your health care provider. Ask your health care provider what activities are safe for you. Managing cramping and bloating  Try walking around when you have cramps or feel bloated. If directed, apply heat to your abdomen as told by your health care provider. Use the heat source that your health care provider recommends, such as a moist heat pack or a heating pad. Place a towel between your skin and the heat source. Leave the heat on for 20-30 minutes. Remove the heat if your skin turns bright red. This is especially important if you are unable to feel pain, heat, or cold. You  have a greater risk of getting burned. General instructions If you were given a sedative during the procedure, it can affect you for several hours. Do not drive or operate machinery until your health care provider says that it is safe. For the first 24 hours after the procedure: Do not sign important documents. Do not drink alcohol. Do your regular daily activities at a slower pace than normal. Eat soft foods that are easy to digest. Take over-the-counter and prescription medicines only as told by your health care provider. Keep all follow-up visits. This is important. Contact a health care provider  if: You have blood in your stool 2-3 days after the procedure. Get help right away if: You have more than a small spotting of blood in your stool. You have large blood clots in your stool. You have swelling of your abdomen. You have nausea or vomiting. You have a fever. You have increasing pain in your abdomen that is not relieved with medicine. These symptoms may be an emergency. Get help right away. Call 911. Do not wait to see if the symptoms will go away. Do not drive yourself to the hospital. Summary After the procedure, it is common to have a small amount of blood in your stool. You may also have mild cramping and bloating of your abdomen. If you were given a sedative during the procedure, it can affect you for several hours. Do not drive or operate machinery until your health care provider says that it is safe. Get help right away if you have a lot of blood in your stool, nausea or vomiting, a fever, or increased pain in your abdomen. This information is not intended to replace advice given to you by your health care provider. Make sure you discuss any questions you have with your health care provider. Document Revised: 09/15/2020 Document Reviewed: 09/15/2020 Elsevier Patient Education  [place] After The following information offers guidance on how to care for yourself after your procedure. Your health care provider may also give you more specific instructions. If you have problems or questions, contact your health care provider. What can I expect after the procedure? After the procedure, it is common to have: Tiredness. Little or no memory about what happened during or after the procedure. Impaired judgment when it comes to making decisions. Nausea or vomiting. Some trouble with balance. Follow these instructions at home: For the time period you were told by your health care provider:  Rest. Do not participate in activities where  you could fall or become injured. Do not drive or use machinery. Do not drink alcohol. Do not take sleeping pills or medicines that cause drowsiness. Do not make important decisions or sign legal documents. Do not take care of children on your own. Medicines Take over-the-counter and prescription medicines only as told by your health care provider. If you were prescribed antibiotics, take them as told by your health care provider. Do not stop using the antibiotic even if you start to feel better. Eating and drinking Follow instructions from your health care provider about what you may eat and drink. Drink enough fluid to keep your urine pale yellow. If you vomit: Drink clear fluids slowly and in small amounts as you are able. Clear fluids include water, ice chips, low-calorie sports drinks, and fruit juice that has water added to it (diluted fruit juice). Eat light and bland foods in small amounts as you are able. These foods include bananas, applesauce, rice,  lean meats, toast, and crackers. General instructions  Have a responsible adult stay with you for the time you are told. It is important to have someone help care for you until you are awake and alert. If you have sleep apnea, surgery and some medicines can increase your risk for breathing problems. Follow instructions from your health care provider about wearing your sleep device: When you are sleeping. This includes during daytime naps. While taking prescription pain medicines, sleeping medicines, or medicines that make you drowsy. Do not use any products that contain nicotine or tobacco. These products include cigarettes, chewing tobacco, and vaping devices, such as e-cigarettes. If you need help quitting, ask your health care provider. Contact a health care provider if: You feel nauseous or vomit every time you eat or drink. You feel light-headed. You are still sleepy or having trouble with balance after 24 hours. You get a  rash. You have a fever. You have redness or swelling around the IV site. Get help right away if: You have trouble breathing. You have new confusion after you get home. These symptoms may be an emergency. Get help right away. Call 911. Do not wait to see if the symptoms will go away. Do not drive yourself to the hospital. This information is not intended to replace advice given to you by your health care provider. Make sure you discuss any questions you have with your health care provider. Document Revised: 06/20/2021 Document Reviewed: 06/20/2021 Elsevier Patient Education  [place].

## 2022-01-04 ENCOUNTER — Encounter (HOSPITAL_COMMUNITY): Payer: Self-pay

## 2022-01-04 ENCOUNTER — Encounter (HOSPITAL_COMMUNITY)
Admission: RE | Admit: 2022-01-04 | Discharge: 2022-01-04 | Disposition: A | Discharge status: Home / Self Care | Payer: PPO | Source: Ambulatory Visit | Attending: Internal Medicine | Admitting: Internal Medicine

## 2022-01-04 DIAGNOSIS — Z01818 Encounter for other preprocedural examination: Secondary | ICD-10-CM | POA: Diagnosis not present

## 2022-01-04 DIAGNOSIS — I4510 Unspecified right bundle-branch block: Secondary | ICD-10-CM | POA: Insufficient documentation

## 2022-01-04 DIAGNOSIS — I10 Essential (primary) hypertension: Secondary | ICD-10-CM | POA: Insufficient documentation

## 2022-01-04 DIAGNOSIS — R001 Bradycardia, unspecified: Secondary | ICD-10-CM | POA: Diagnosis not present

## 2022-01-04 HISTORY — DX: Gastro-esophageal reflux disease without esophagitis: K21.9

## 2022-01-06 ENCOUNTER — Encounter (HOSPITAL_COMMUNITY): Payer: Self-pay

## 2022-01-06 ENCOUNTER — Encounter (HOSPITAL_COMMUNITY)
Admission: RE | Disposition: A | Discharge status: Home / Self Care | Payer: Self-pay | Source: Home / Self Care | Attending: Internal Medicine

## 2022-01-06 ENCOUNTER — Ambulatory Visit (HOSPITAL_COMMUNITY)
Admission: RE | Admit: 2022-01-06 | Discharge: 2022-01-06 | Disposition: A | Discharge status: Home / Self Care | Payer: PPO | Attending: Internal Medicine | Admitting: Internal Medicine

## 2022-01-06 ENCOUNTER — Ambulatory Visit (HOSPITAL_COMMUNITY): Payer: PPO | Admitting: Anesthesiology

## 2022-01-06 ENCOUNTER — Ambulatory Visit (HOSPITAL_BASED_OUTPATIENT_CLINIC_OR_DEPARTMENT_OTHER): Payer: PPO | Admitting: Anesthesiology

## 2022-01-06 DIAGNOSIS — D123 Benign neoplasm of transverse colon: Secondary | ICD-10-CM

## 2022-01-06 DIAGNOSIS — I10 Essential (primary) hypertension: Secondary | ICD-10-CM | POA: Diagnosis not present

## 2022-01-06 DIAGNOSIS — K648 Other hemorrhoids: Secondary | ICD-10-CM

## 2022-01-06 DIAGNOSIS — E7800 Pure hypercholesterolemia, unspecified: Secondary | ICD-10-CM | POA: Diagnosis not present

## 2022-01-06 DIAGNOSIS — C9000 Multiple myeloma not having achieved remission: Secondary | ICD-10-CM | POA: Diagnosis not present

## 2022-01-06 DIAGNOSIS — Z1212 Encounter for screening for malignant neoplasm of rectum: Secondary | ICD-10-CM | POA: Diagnosis not present

## 2022-01-06 DIAGNOSIS — Z87891 Personal history of nicotine dependence: Secondary | ICD-10-CM | POA: Insufficient documentation

## 2022-01-06 DIAGNOSIS — R195 Other fecal abnormalities: Secondary | ICD-10-CM | POA: Diagnosis not present

## 2022-01-06 DIAGNOSIS — K219 Gastro-esophageal reflux disease without esophagitis: Secondary | ICD-10-CM | POA: Diagnosis not present

## 2022-01-06 DIAGNOSIS — F17210 Nicotine dependence, cigarettes, uncomplicated: Secondary | ICD-10-CM | POA: Diagnosis not present

## 2022-01-06 DIAGNOSIS — Z1211 Encounter for screening for malignant neoplasm of colon: Secondary | ICD-10-CM

## 2022-01-06 DIAGNOSIS — Z86718 Personal history of other venous thrombosis and embolism: Secondary | ICD-10-CM | POA: Insufficient documentation

## 2022-01-06 DIAGNOSIS — Z79624 Long term (current) use of inhibitors of nucleotide synthesis: Secondary | ICD-10-CM | POA: Insufficient documentation

## 2022-01-06 DIAGNOSIS — Z83711 Family history of hyperplastic colon polyps: Secondary | ICD-10-CM | POA: Diagnosis not present

## 2022-01-06 DIAGNOSIS — Z9481 Bone marrow transplant status: Secondary | ICD-10-CM | POA: Insufficient documentation

## 2022-01-06 DIAGNOSIS — Z7901 Long term (current) use of anticoagulants: Secondary | ICD-10-CM | POA: Insufficient documentation

## 2022-01-06 HISTORY — PX: POLYPECTOMY: SHX5525

## 2022-01-06 HISTORY — PX: COLONOSCOPY WITH PROPOFOL: SHX5780

## 2022-01-06 SURGERY — COLONOSCOPY WITH PROPOFOL
Anesthesia: General

## 2022-01-06 MED ORDER — LACTATED RINGERS IV SOLN: INTRAVENOUS | Status: DC

## 2022-01-06 MED ORDER — PROPOFOL 500 MG/50ML IV EMUL
INTRAVENOUS | Status: DC | PRN
  Administered 2022-01-06: 150 ug/kg/min via INTRAVENOUS

## 2022-01-06 MED ORDER — PROPOFOL 10 MG/ML IV BOLUS
INTRAVENOUS | Status: DC | PRN
  Administered 2022-01-06: 100 mg via INTRAVENOUS

## 2022-01-06 NOTE — Interval H&P Note (Signed)
History and Physical Interval Note:  01/06/2022 9:06 AM  Craig Rivera  has presented today for surgery, with the diagnosis of positive cologuard.  The various methods of treatment have been discussed with the patient and family. After consideration of risks, benefits and other options for treatment, the patient has consented to  Procedure(s) with comments: COLONOSCOPY WITH PROPOFOL (N/A) - 10:00am as a surgical intervention.  The patient's history has been reviewed, patient examined, no change in status, stable for surgery.  I have reviewed the patient's chart and labs.  Questions were answered to the patient's satisfaction.     Eloise Harman

## 2022-01-06 NOTE — Op Note (Signed)
[facility] Patient Name: Craig Rivera Procedure Date: 01/06/2022 9:29 AM MRN: [id number] Date of Birth: 1955/09/14 Attending MD: Elon Alas. Abbey Chatters , [place], [id number] CSN: [id number] Age: 66 Admit Type: Outpatient Procedure:                Colonoscopy Indications:              Positive Cologuard test Providers:                Elon Alas. Abbey Chatters, DO, Tammy Vaught, RN, Everardo Pacific Referring MD:             Elon Alas. Abbey Chatters, DO Medicines:                See the Anesthesia note for documentation of the                            administered medications Complications:            No immediate complications. Estimated Blood Loss:     Estimated blood loss was minimal. Procedure:                Pre-Anesthesia Assessment:                           - The anesthesia plan was to use monitored                            anesthesia care (MAC).                           After obtaining informed consent, the colonoscope                            was passed under direct vision. Throughout the                            procedure, the patient's blood pressure, pulse, and                            oxygen saturations were monitored continuously. The                            [id number]-HQ190L ([id number]) scope was introduced through                            the anus and advanced to the the cecum, identified                            by appendiceal orifice and ileocecal valve. The                            colonoscopy was performed without difficulty. The                            patient tolerated the procedure well.  The quality                            of the bowel preparation was evaluated using the                            BBPS [facility] Bowel Preparation Scale) with scores                            of: Right Colon = 3, Transverse Colon = 3 and Left                            Colon = 3 (entire mucosa seen well with no residual                            staining,  small fragments of stool or opaque                            liquid). The total BBPS score equals 9. Scope In: 9:46:13 AM Scope Out: 9:58:09 AM Scope Withdrawal Time: 0 hours 10 minutes 21 seconds  Total Procedure Duration: 0 hours 11 minutes 56 seconds  Findings:      The perianal and digital rectal examinations were normal.      Non-bleeding internal hemorrhoids were found during endoscopy.      A 7 mm polyp was found in the transverse colon. The polyp was sessile.       The polyp was removed with a cold snare. Resection and retrieval were       complete.      The exam was otherwise without abnormality. Impression:               - Non-bleeding internal hemorrhoids.                           - One 7 mm polyp in the transverse colon, removed                            with a cold snare. Resected and retrieved.                           - The examination was otherwise normal. Moderate Sedation:      Per Anesthesia Care Recommendation:           - Patient has a contact number available for                            emergencies. The signs and symptoms of potential                            delayed complications were discussed with the                            patient. Return to normal activities tomorrow.  Written discharge instructions were provided to the                            patient.                           - Resume previous diet.                           - Continue present medications.                           - Await pathology results.                           - Repeat colonoscopy in 5 years for surveillance.                           - Return to GI clinic PRN. Procedure Code(s):        --- Professional ---                           [phone], Colonoscopy, flexible; with removal of                            tumor(s), polyp(s), or other lesion(s) by snare                            technique Diagnosis Code(s):        --- Professional ---                            D12.3, Benign neoplasm of transverse colon (hepatic                            flexure or splenic flexure)                           K64.8, Other hemorrhoids                           R19.5, Other fecal abnormalities CPT copyright 2022 American Medical Association. All rights reserved. The codes documented in this report are preliminary and upon coder review may  be revised to meet current compliance requirements. Elon Alas. Abbey Chatters, DO [place] Samantha Ragen, DO 01/06/2022 10:00:38 AM This report has been signed electronically. Number of Addenda: 0

## 2022-01-06 NOTE — Discharge Instructions (Signed)
  Colonoscopy Discharge Instructions  Read the instructions outlined below and refer to this sheet in the next few weeks. These discharge instructions provide you with general information on caring for yourself after you leave the hospital. Your doctor may also give you specific instructions. While your treatment has been planned according to the most current medical practices available, unavoidable complications occasionally occur.   ACTIVITY You may resume your regular activity, but move at a slower pace for the next 24 hours.  Take frequent rest periods for the next 24 hours.  Walking will help get rid of the air and reduce the bloated feeling in your belly (abdomen).  No driving for 24 hours (because of the medicine (anesthesia) used during the test).   Do not sign any important legal documents or operate any machinery for 24 hours (because of the anesthesia used during the test).  NUTRITION Drink plenty of fluids.  You may resume your normal diet as instructed by your doctor.  Begin with a light meal and progress to your normal diet. Heavy or fried foods are harder to digest and may make you feel sick to your stomach (nauseated).  Avoid alcoholic beverages for 24 hours or as instructed.  MEDICATIONS You may resume your normal medications unless your doctor tells you otherwise.  WHAT YOU CAN EXPECT TODAY Some feelings of bloating in the abdomen.  Passage of more gas than usual.  Spotting of blood in your stool or on the toilet paper.  IF YOU HAD POLYPS REMOVED DURING THE COLONOSCOPY: No aspirin products for 7 days or as instructed.  No alcohol for 7 days or as instructed.  Eat a soft diet for the next 24 hours.  FINDING OUT THE RESULTS OF YOUR TEST Not all test results are available during your visit. If your test results are not back during the visit, make an appointment with your caregiver to find out the results. Do not assume everything is normal if you have not heard from your  caregiver or the medical facility. It is important for you to follow up on all of your test results.  SEEK IMMEDIATE MEDICAL ATTENTION IF: You have more than a spotting of blood in your stool.  Your belly is swollen (abdominal distention).  You are nauseated or vomiting.  You have a temperature over 101.  You have abdominal pain or discomfort that is severe or gets worse throughout the day.   Your colonoscopy revealed 1 polyp(s) which I removed successfully. Await pathology results, my office will contact you. I recommend repeating colonoscopy in 5 years for surveillance purposes. Otherwise follow up with GI as needed   I hope you have a great rest of your week!  Elon Alas. Abbey Chatters, D.O. Gastroenterology and Hepatology [facility]

## 2022-01-06 NOTE — Anesthesia Preprocedure Evaluation (Signed)
Anesthesia Evaluation  Patient identified by MRN, date of birth, ID band Patient awake    Reviewed: Allergy & Precautions, H&P , NPO status , Patient's Chart, lab work & pertinent test results  Airway Mallampati: II  TM Distance: >3 FB Neck ROM: Full    Dental  (+) Missing, Dental Advisory Given   Pulmonary Current Smoker and Patient abstained from smoking.   Pulmonary exam normal breath sounds clear to auscultation       Cardiovascular Exercise Tolerance: Good hypertension, Pt. on medications + DVT (on  xarelto)  Normal cardiovascular exam Rhythm:Regular Rate:Normal     Neuro/Psych  Neuromuscular disease  negative psych ROS   GI/Hepatic Neg liver ROS,GERD  Medicated and Controlled,,  Endo/Other  negative endocrine ROS    Renal/GU negative Renal ROS  negative genitourinary   Musculoskeletal negative musculoskeletal ROS (+)    Abdominal   Peds negative pediatric ROS (+)  Hematology  (+) Blood dyscrasia (multiple myeloma)   Anesthesia Other Findings Back pain, sciatica  Fentanyl patch  Reproductive/Obstetrics negative OB ROS                              Anesthesia Physical Anesthesia Plan  ASA: 3  Anesthesia Plan: General   Post-op Pain Management: Minimal or no pain anticipated   Induction: Intravenous  PONV Risk Score and Plan: 1 and Propofol infusion  Airway Management Planned: Natural Airway and Nasal Cannula  Additional Equipment:   Intra-op Plan:   Post-operative Plan:   Informed Consent: I have reviewed the patients History and Physical, chart, labs and discussed the procedure including the risks, benefits and alternatives for the proposed anesthesia with the patient or authorized representative who has indicated his/her understanding and acceptance.     Dental advisory given  Plan Discussed with: CRNA  Anesthesia Plan Comments:          Anesthesia  Quick Evaluation

## 2022-01-06 NOTE — Transfer of Care (Signed)
Immediate Anesthesia Transfer of Care Note  Patient: Craig Rivera  Procedure(s) Performed: COLONOSCOPY WITH PROPOFOL POLYPECTOMY  Patient Location: Short Stay  Anesthesia Type:General  Level of Consciousness: awake, alert , oriented, and patient cooperative  Airway & Oxygen Therapy: Patient Spontanous Breathing  Post-op Assessment: Report given to RN, Post -op Vital signs reviewed and stable, and Patient moving all extremities X 4  Post vital signs: Reviewed and stable  Last Vitals:  Vitals Value Taken Time  BP 93/58 01/06/22 1003  Temp 36.7 C 01/06/22 1003  Pulse 48 01/06/22 1003  Resp 17 01/06/22 1003  SpO2 93 % 01/06/22 1003    Last Pain:  Vitals:   01/06/22 1003  TempSrc: Oral  PainSc: 0-No pain         Complications: No notable events documented.

## 2022-01-06 NOTE — Anesthesia Postprocedure Evaluation (Signed)
Anesthesia Post Note  Patient: Craig Rivera  Procedure(s) Performed: COLONOSCOPY WITH PROPOFOL POLYPECTOMY  Patient location during evaluation: Phase II Anesthesia Type: General Level of consciousness: awake and alert and oriented Pain management: pain level controlled Vital Signs Assessment: post-procedure vital signs reviewed and stable Respiratory status: spontaneous breathing, nonlabored ventilation and respiratory function stable Cardiovascular status: blood pressure returned to baseline and stable Postop Assessment: no apparent nausea or vomiting Anesthetic complications: no  No notable events documented.   Last Vitals:  Vitals:   01/06/22 0904 01/06/22 1003  BP: (!) 155/79 (!) 93/58  Pulse: (!) 52 (!) 48  Resp: 16 17  Temp: 36.7 C 36.7 C  SpO2: 98% 93%    Last Pain:  Vitals:   01/06/22 1003  TempSrc: Oral  PainSc: 0-No pain                 Floria Brandau C Napolean Sia

## 2022-01-09 DIAGNOSIS — C9000 Multiple myeloma not having achieved remission: Secondary | ICD-10-CM | POA: Diagnosis not present

## 2022-01-09 LAB — SURGICAL PATHOLOGY

## 2022-01-13 ENCOUNTER — Encounter (HOSPITAL_COMMUNITY): Payer: Self-pay | Admitting: Internal Medicine

## 2022-01-24 DIAGNOSIS — Z1159 Encounter for screening for other viral diseases: Secondary | ICD-10-CM | POA: Diagnosis not present

## 2022-01-24 DIAGNOSIS — Z9484 Stem cells transplant status: Secondary | ICD-10-CM | POA: Diagnosis not present

## 2022-01-24 DIAGNOSIS — G893 Neoplasm related pain (acute) (chronic): Secondary | ICD-10-CM | POA: Diagnosis not present

## 2022-01-24 DIAGNOSIS — G620 Drug-induced polyneuropathy: Secondary | ICD-10-CM | POA: Diagnosis not present

## 2022-01-24 DIAGNOSIS — C9000 Multiple myeloma not having achieved remission: Secondary | ICD-10-CM | POA: Diagnosis not present

## 2022-01-24 DIAGNOSIS — T451X5A Adverse effect of antineoplastic and immunosuppressive drugs, initial encounter: Secondary | ICD-10-CM | POA: Diagnosis not present

## 2022-01-24 DIAGNOSIS — Z1211 Encounter for screening for malignant neoplasm of colon: Secondary | ICD-10-CM | POA: Diagnosis not present

## 2022-01-24 DIAGNOSIS — Z5111 Encounter for antineoplastic chemotherapy: Secondary | ICD-10-CM | POA: Diagnosis not present

## 2022-01-24 DIAGNOSIS — D801 Nonfamilial hypogammaglobulinemia: Secondary | ICD-10-CM | POA: Diagnosis not present

## 2022-01-25 DIAGNOSIS — Z1159 Encounter for screening for other viral diseases: Secondary | ICD-10-CM | POA: Diagnosis not present

## 2022-01-25 DIAGNOSIS — Z9484 Stem cells transplant status: Secondary | ICD-10-CM | POA: Diagnosis not present

## 2022-01-25 DIAGNOSIS — C9000 Multiple myeloma not having achieved remission: Secondary | ICD-10-CM | POA: Diagnosis not present

## 2022-01-25 DIAGNOSIS — Z5111 Encounter for antineoplastic chemotherapy: Secondary | ICD-10-CM | POA: Diagnosis not present

## 2022-01-26 DIAGNOSIS — Z139 Encounter for screening, unspecified: Secondary | ICD-10-CM | POA: Diagnosis not present

## 2022-01-31 DIAGNOSIS — E782 Mixed hyperlipidemia: Secondary | ICD-10-CM | POA: Insufficient documentation

## 2022-01-31 DIAGNOSIS — R600 Localized edema: Secondary | ICD-10-CM | POA: Insufficient documentation

## 2022-01-31 DIAGNOSIS — G8929 Other chronic pain: Secondary | ICD-10-CM | POA: Insufficient documentation

## 2022-01-31 DIAGNOSIS — K219 Gastro-esophageal reflux disease without esophagitis: Secondary | ICD-10-CM | POA: Insufficient documentation

## 2022-01-31 DIAGNOSIS — M5430 Sciatica, unspecified side: Secondary | ICD-10-CM | POA: Insufficient documentation

## 2022-01-31 DIAGNOSIS — R110 Nausea: Secondary | ICD-10-CM | POA: Insufficient documentation

## 2022-01-31 DIAGNOSIS — C9000 Multiple myeloma not having achieved remission: Secondary | ICD-10-CM | POA: Insufficient documentation

## 2022-02-01 DIAGNOSIS — R300 Dysuria: Secondary | ICD-10-CM | POA: Diagnosis not present

## 2022-02-01 DIAGNOSIS — Z86718 Personal history of other venous thrombosis and embolism: Secondary | ICD-10-CM | POA: Diagnosis not present

## 2022-02-01 DIAGNOSIS — G8929 Other chronic pain: Secondary | ICD-10-CM | POA: Diagnosis not present

## 2022-02-01 DIAGNOSIS — E669 Obesity, unspecified: Secondary | ICD-10-CM | POA: Diagnosis not present

## 2022-02-01 DIAGNOSIS — E7800 Pure hypercholesterolemia, unspecified: Secondary | ICD-10-CM | POA: Diagnosis not present

## 2022-02-01 DIAGNOSIS — M545 Low back pain, unspecified: Secondary | ICD-10-CM | POA: Diagnosis not present

## 2022-02-01 DIAGNOSIS — C9000 Multiple myeloma not having achieved remission: Secondary | ICD-10-CM | POA: Diagnosis not present

## 2022-02-01 DIAGNOSIS — Z1211 Encounter for screening for malignant neoplasm of colon: Secondary | ICD-10-CM | POA: Diagnosis not present

## 2022-02-01 DIAGNOSIS — K219 Gastro-esophageal reflux disease without esophagitis: Secondary | ICD-10-CM | POA: Diagnosis not present

## 2022-02-01 DIAGNOSIS — Z0001 Encounter for general adult medical examination with abnormal findings: Secondary | ICD-10-CM | POA: Diagnosis not present

## 2022-02-01 DIAGNOSIS — R600 Localized edema: Secondary | ICD-10-CM | POA: Diagnosis not present

## 2022-02-01 DIAGNOSIS — I10 Essential (primary) hypertension: Secondary | ICD-10-CM | POA: Diagnosis not present

## 2022-02-22 DIAGNOSIS — Z9484 Stem cells transplant status: Secondary | ICD-10-CM | POA: Diagnosis not present

## 2022-02-22 DIAGNOSIS — Z5111 Encounter for antineoplastic chemotherapy: Secondary | ICD-10-CM | POA: Diagnosis not present

## 2022-02-22 DIAGNOSIS — C9000 Multiple myeloma not having achieved remission: Secondary | ICD-10-CM | POA: Diagnosis not present

## 2022-02-22 DIAGNOSIS — Z1159 Encounter for screening for other viral diseases: Secondary | ICD-10-CM | POA: Diagnosis not present

## 2022-02-23 DIAGNOSIS — Z1159 Encounter for screening for other viral diseases: Secondary | ICD-10-CM | POA: Diagnosis not present

## 2022-02-23 DIAGNOSIS — C9002 Multiple myeloma in relapse: Secondary | ICD-10-CM | POA: Diagnosis not present

## 2022-02-23 DIAGNOSIS — Z5112 Encounter for antineoplastic immunotherapy: Secondary | ICD-10-CM | POA: Diagnosis not present

## 2022-02-23 DIAGNOSIS — Z9484 Stem cells transplant status: Secondary | ICD-10-CM | POA: Diagnosis not present

## 2022-03-22 DIAGNOSIS — M545 Low back pain, unspecified: Secondary | ICD-10-CM | POA: Diagnosis not present

## 2022-03-22 DIAGNOSIS — G8929 Other chronic pain: Secondary | ICD-10-CM | POA: Diagnosis not present

## 2022-03-22 DIAGNOSIS — D801 Nonfamilial hypogammaglobulinemia: Secondary | ICD-10-CM | POA: Diagnosis not present

## 2022-03-22 DIAGNOSIS — C9000 Multiple myeloma not having achieved remission: Secondary | ICD-10-CM | POA: Diagnosis not present

## 2022-03-22 DIAGNOSIS — Z9484 Stem cells transplant status: Secondary | ICD-10-CM | POA: Diagnosis not present

## 2022-03-22 DIAGNOSIS — Z7901 Long term (current) use of anticoagulants: Secondary | ICD-10-CM | POA: Diagnosis not present

## 2022-03-22 DIAGNOSIS — Z1159 Encounter for screening for other viral diseases: Secondary | ICD-10-CM | POA: Diagnosis not present

## 2022-03-22 DIAGNOSIS — Z5111 Encounter for antineoplastic chemotherapy: Secondary | ICD-10-CM | POA: Diagnosis not present

## 2022-03-24 DIAGNOSIS — C9000 Multiple myeloma not having achieved remission: Secondary | ICD-10-CM | POA: Diagnosis not present

## 2022-03-24 DIAGNOSIS — Z9484 Stem cells transplant status: Secondary | ICD-10-CM | POA: Diagnosis not present

## 2022-03-24 DIAGNOSIS — Z1159 Encounter for screening for other viral diseases: Secondary | ICD-10-CM | POA: Diagnosis not present

## 2022-03-24 DIAGNOSIS — Z5112 Encounter for antineoplastic immunotherapy: Secondary | ICD-10-CM | POA: Diagnosis not present

## 2022-03-28 DIAGNOSIS — L820 Inflamed seborrheic keratosis: Secondary | ICD-10-CM | POA: Diagnosis not present

## 2022-03-28 DIAGNOSIS — C44519 Basal cell carcinoma of skin of other part of trunk: Secondary | ICD-10-CM | POA: Diagnosis not present

## 2022-04-06 DIAGNOSIS — J392 Other diseases of pharynx: Secondary | ICD-10-CM | POA: Diagnosis not present

## 2022-04-06 DIAGNOSIS — I10 Essential (primary) hypertension: Secondary | ICD-10-CM | POA: Diagnosis not present

## 2022-04-06 DIAGNOSIS — Z9484 Stem cells transplant status: Secondary | ICD-10-CM | POA: Diagnosis not present

## 2022-04-06 DIAGNOSIS — F17210 Nicotine dependence, cigarettes, uncomplicated: Secondary | ICD-10-CM | POA: Diagnosis not present

## 2022-04-06 DIAGNOSIS — G629 Polyneuropathy, unspecified: Secondary | ICD-10-CM | POA: Diagnosis not present

## 2022-04-06 DIAGNOSIS — J329 Chronic sinusitis, unspecified: Secondary | ICD-10-CM | POA: Diagnosis not present

## 2022-04-06 DIAGNOSIS — E041 Nontoxic single thyroid nodule: Secondary | ICD-10-CM | POA: Diagnosis not present

## 2022-04-06 DIAGNOSIS — Z7901 Long term (current) use of anticoagulants: Secondary | ICD-10-CM | POA: Diagnosis not present

## 2022-04-06 DIAGNOSIS — Z9221 Personal history of antineoplastic chemotherapy: Secondary | ICD-10-CM | POA: Diagnosis not present

## 2022-04-06 DIAGNOSIS — D649 Anemia, unspecified: Secondary | ICD-10-CM | POA: Diagnosis not present

## 2022-04-06 DIAGNOSIS — Z79899 Other long term (current) drug therapy: Secondary | ICD-10-CM | POA: Diagnosis not present

## 2022-04-06 DIAGNOSIS — E785 Hyperlipidemia, unspecified: Secondary | ICD-10-CM | POA: Diagnosis not present

## 2022-04-18 DIAGNOSIS — G479 Sleep disorder, unspecified: Secondary | ICD-10-CM | POA: Diagnosis not present

## 2022-04-18 DIAGNOSIS — G893 Neoplasm related pain (acute) (chronic): Secondary | ICD-10-CM | POA: Diagnosis not present

## 2022-04-18 DIAGNOSIS — Z9484 Stem cells transplant status: Secondary | ICD-10-CM | POA: Diagnosis not present

## 2022-04-18 DIAGNOSIS — Z1159 Encounter for screening for other viral diseases: Secondary | ICD-10-CM | POA: Diagnosis not present

## 2022-04-18 DIAGNOSIS — G909 Disorder of the autonomic nervous system, unspecified: Secondary | ICD-10-CM | POA: Diagnosis not present

## 2022-04-18 DIAGNOSIS — D801 Nonfamilial hypogammaglobulinemia: Secondary | ICD-10-CM | POA: Diagnosis not present

## 2022-04-18 DIAGNOSIS — E041 Nontoxic single thyroid nodule: Secondary | ICD-10-CM | POA: Diagnosis not present

## 2022-04-18 DIAGNOSIS — Z5111 Encounter for antineoplastic chemotherapy: Secondary | ICD-10-CM | POA: Diagnosis not present

## 2022-04-18 DIAGNOSIS — R5383 Other fatigue: Secondary | ICD-10-CM | POA: Diagnosis not present

## 2022-04-18 DIAGNOSIS — C9000 Multiple myeloma not having achieved remission: Secondary | ICD-10-CM | POA: Diagnosis not present

## 2022-04-18 DIAGNOSIS — Z09 Encounter for follow-up examination after completed treatment for conditions other than malignant neoplasm: Secondary | ICD-10-CM | POA: Diagnosis not present

## 2022-04-20 DIAGNOSIS — C9000 Multiple myeloma not having achieved remission: Secondary | ICD-10-CM | POA: Diagnosis not present

## 2022-04-20 DIAGNOSIS — Z1159 Encounter for screening for other viral diseases: Secondary | ICD-10-CM | POA: Diagnosis not present

## 2022-04-20 DIAGNOSIS — Z9484 Stem cells transplant status: Secondary | ICD-10-CM | POA: Diagnosis not present

## 2022-04-20 DIAGNOSIS — Z5112 Encounter for antineoplastic immunotherapy: Secondary | ICD-10-CM | POA: Diagnosis not present

## 2022-05-08 DIAGNOSIS — I10 Essential (primary) hypertension: Secondary | ICD-10-CM | POA: Diagnosis not present

## 2022-05-08 DIAGNOSIS — Z125 Encounter for screening for malignant neoplasm of prostate: Secondary | ICD-10-CM | POA: Diagnosis not present

## 2022-05-12 DIAGNOSIS — N401 Enlarged prostate with lower urinary tract symptoms: Secondary | ICD-10-CM | POA: Insufficient documentation

## 2022-05-12 DIAGNOSIS — E041 Nontoxic single thyroid nodule: Secondary | ICD-10-CM | POA: Insufficient documentation

## 2022-05-12 DIAGNOSIS — J341 Cyst and mucocele of nose and nasal sinus: Secondary | ICD-10-CM | POA: Insufficient documentation

## 2022-05-12 DIAGNOSIS — I10 Essential (primary) hypertension: Secondary | ICD-10-CM | POA: Diagnosis not present

## 2022-05-12 DIAGNOSIS — Z0000 Encounter for general adult medical examination without abnormal findings: Secondary | ICD-10-CM | POA: Diagnosis not present

## 2022-05-12 DIAGNOSIS — C9000 Multiple myeloma not having achieved remission: Secondary | ICD-10-CM | POA: Diagnosis not present

## 2022-05-12 DIAGNOSIS — E8581 Light chain (AL) amyloidosis: Secondary | ICD-10-CM | POA: Diagnosis not present

## 2022-05-12 DIAGNOSIS — E669 Obesity, unspecified: Secondary | ICD-10-CM | POA: Insufficient documentation

## 2022-05-12 DIAGNOSIS — E876 Hypokalemia: Secondary | ICD-10-CM | POA: Diagnosis not present

## 2022-05-12 DIAGNOSIS — R600 Localized edema: Secondary | ICD-10-CM | POA: Diagnosis not present

## 2022-05-12 DIAGNOSIS — J329 Chronic sinusitis, unspecified: Secondary | ICD-10-CM | POA: Diagnosis not present

## 2022-05-12 DIAGNOSIS — Z86718 Personal history of other venous thrombosis and embolism: Secondary | ICD-10-CM | POA: Diagnosis not present

## 2022-05-16 DIAGNOSIS — Z9484 Stem cells transplant status: Secondary | ICD-10-CM | POA: Diagnosis not present

## 2022-05-16 DIAGNOSIS — Z5111 Encounter for antineoplastic chemotherapy: Secondary | ICD-10-CM | POA: Diagnosis not present

## 2022-05-16 DIAGNOSIS — C9000 Multiple myeloma not having achieved remission: Secondary | ICD-10-CM | POA: Diagnosis not present

## 2022-05-16 DIAGNOSIS — D801 Nonfamilial hypogammaglobulinemia: Secondary | ICD-10-CM | POA: Diagnosis not present

## 2022-05-16 DIAGNOSIS — G893 Neoplasm related pain (acute) (chronic): Secondary | ICD-10-CM | POA: Diagnosis not present

## 2022-05-16 DIAGNOSIS — R5383 Other fatigue: Secondary | ICD-10-CM | POA: Diagnosis not present

## 2022-05-16 DIAGNOSIS — Z1159 Encounter for screening for other viral diseases: Secondary | ICD-10-CM | POA: Diagnosis not present

## 2022-05-18 DIAGNOSIS — Z1159 Encounter for screening for other viral diseases: Secondary | ICD-10-CM | POA: Diagnosis not present

## 2022-05-18 DIAGNOSIS — Z5111 Encounter for antineoplastic chemotherapy: Secondary | ICD-10-CM | POA: Diagnosis not present

## 2022-05-18 DIAGNOSIS — Z9484 Stem cells transplant status: Secondary | ICD-10-CM | POA: Diagnosis not present

## 2022-05-18 DIAGNOSIS — C9000 Multiple myeloma not having achieved remission: Secondary | ICD-10-CM | POA: Diagnosis not present

## 2022-06-12 DIAGNOSIS — I10 Essential (primary) hypertension: Secondary | ICD-10-CM | POA: Diagnosis not present

## 2022-06-14 DIAGNOSIS — Z5111 Encounter for antineoplastic chemotherapy: Secondary | ICD-10-CM | POA: Diagnosis not present

## 2022-06-14 DIAGNOSIS — Z9484 Stem cells transplant status: Secondary | ICD-10-CM | POA: Diagnosis not present

## 2022-06-14 DIAGNOSIS — C9000 Multiple myeloma not having achieved remission: Secondary | ICD-10-CM | POA: Diagnosis not present

## 2022-06-14 DIAGNOSIS — Z1159 Encounter for screening for other viral diseases: Secondary | ICD-10-CM | POA: Diagnosis not present

## 2022-06-15 DIAGNOSIS — Z1159 Encounter for screening for other viral diseases: Secondary | ICD-10-CM | POA: Diagnosis not present

## 2022-06-15 DIAGNOSIS — Z9484 Stem cells transplant status: Secondary | ICD-10-CM | POA: Diagnosis not present

## 2022-06-15 DIAGNOSIS — Z5112 Encounter for antineoplastic immunotherapy: Secondary | ICD-10-CM | POA: Diagnosis not present

## 2022-06-15 DIAGNOSIS — C9000 Multiple myeloma not having achieved remission: Secondary | ICD-10-CM | POA: Diagnosis not present

## 2022-06-22 DIAGNOSIS — I10 Essential (primary) hypertension: Secondary | ICD-10-CM | POA: Diagnosis not present

## 2022-06-22 DIAGNOSIS — F17210 Nicotine dependence, cigarettes, uncomplicated: Secondary | ICD-10-CM | POA: Diagnosis not present

## 2022-06-22 DIAGNOSIS — Z79899 Other long term (current) drug therapy: Secondary | ICD-10-CM | POA: Diagnosis not present

## 2022-06-26 DIAGNOSIS — T451X5A Adverse effect of antineoplastic and immunosuppressive drugs, initial encounter: Secondary | ICD-10-CM | POA: Diagnosis not present

## 2022-06-26 DIAGNOSIS — D701 Agranulocytosis secondary to cancer chemotherapy: Secondary | ICD-10-CM | POA: Diagnosis not present

## 2022-06-26 DIAGNOSIS — C9000 Multiple myeloma not having achieved remission: Secondary | ICD-10-CM | POA: Diagnosis not present

## 2022-06-28 DIAGNOSIS — Z85828 Personal history of other malignant neoplasm of skin: Secondary | ICD-10-CM | POA: Diagnosis not present

## 2022-06-28 DIAGNOSIS — Z08 Encounter for follow-up examination after completed treatment for malignant neoplasm: Secondary | ICD-10-CM | POA: Diagnosis not present

## 2022-07-04 DIAGNOSIS — F17210 Nicotine dependence, cigarettes, uncomplicated: Secondary | ICD-10-CM | POA: Diagnosis not present

## 2022-07-04 DIAGNOSIS — Z79899 Other long term (current) drug therapy: Secondary | ICD-10-CM | POA: Diagnosis not present

## 2022-07-04 DIAGNOSIS — I10 Essential (primary) hypertension: Secondary | ICD-10-CM | POA: Diagnosis not present

## 2022-07-07 DIAGNOSIS — Z713 Dietary counseling and surveillance: Secondary | ICD-10-CM | POA: Diagnosis not present

## 2022-07-07 DIAGNOSIS — Z6830 Body mass index (BMI) 30.0-30.9, adult: Secondary | ICD-10-CM | POA: Diagnosis not present

## 2022-07-07 DIAGNOSIS — R600 Localized edema: Secondary | ICD-10-CM | POA: Diagnosis not present

## 2022-07-07 DIAGNOSIS — I10 Essential (primary) hypertension: Secondary | ICD-10-CM | POA: Diagnosis not present

## 2022-07-07 DIAGNOSIS — Z79899 Other long term (current) drug therapy: Secondary | ICD-10-CM | POA: Diagnosis not present

## 2022-07-07 DIAGNOSIS — C9000 Multiple myeloma not having achieved remission: Secondary | ICD-10-CM | POA: Diagnosis not present

## 2022-07-12 DIAGNOSIS — Z5111 Encounter for antineoplastic chemotherapy: Secondary | ICD-10-CM | POA: Diagnosis not present

## 2022-07-12 DIAGNOSIS — G893 Neoplasm related pain (acute) (chronic): Secondary | ICD-10-CM | POA: Diagnosis not present

## 2022-07-12 DIAGNOSIS — Z1159 Encounter for screening for other viral diseases: Secondary | ICD-10-CM | POA: Diagnosis not present

## 2022-07-12 DIAGNOSIS — Z9484 Stem cells transplant status: Secondary | ICD-10-CM | POA: Diagnosis not present

## 2022-07-12 DIAGNOSIS — T451X5A Adverse effect of antineoplastic and immunosuppressive drugs, initial encounter: Secondary | ICD-10-CM | POA: Diagnosis not present

## 2022-07-12 DIAGNOSIS — D801 Nonfamilial hypogammaglobulinemia: Secondary | ICD-10-CM | POA: Diagnosis not present

## 2022-07-12 DIAGNOSIS — I10 Essential (primary) hypertension: Secondary | ICD-10-CM | POA: Diagnosis not present

## 2022-07-12 DIAGNOSIS — C9000 Multiple myeloma not having achieved remission: Secondary | ICD-10-CM | POA: Diagnosis not present

## 2022-07-12 DIAGNOSIS — R5383 Other fatigue: Secondary | ICD-10-CM | POA: Diagnosis not present

## 2022-07-12 DIAGNOSIS — D701 Agranulocytosis secondary to cancer chemotherapy: Secondary | ICD-10-CM | POA: Diagnosis not present

## 2022-07-13 DIAGNOSIS — C9000 Multiple myeloma not having achieved remission: Secondary | ICD-10-CM | POA: Diagnosis not present

## 2022-07-13 DIAGNOSIS — Z9484 Stem cells transplant status: Secondary | ICD-10-CM | POA: Diagnosis not present

## 2022-07-13 DIAGNOSIS — C9002 Multiple myeloma in relapse: Secondary | ICD-10-CM | POA: Diagnosis not present

## 2022-07-13 DIAGNOSIS — Z1159 Encounter for screening for other viral diseases: Secondary | ICD-10-CM | POA: Diagnosis not present

## 2022-07-13 DIAGNOSIS — Z5112 Encounter for antineoplastic immunotherapy: Secondary | ICD-10-CM | POA: Diagnosis not present

## 2022-07-25 DIAGNOSIS — C9002 Multiple myeloma in relapse: Secondary | ICD-10-CM | POA: Diagnosis not present

## 2022-07-25 DIAGNOSIS — C9000 Multiple myeloma not having achieved remission: Secondary | ICD-10-CM | POA: Diagnosis not present

## 2022-07-25 DIAGNOSIS — Z8579 Personal history of other malignant neoplasms of lymphoid, hematopoietic and related tissues: Secondary | ICD-10-CM | POA: Diagnosis not present

## 2022-07-25 DIAGNOSIS — K5730 Diverticulosis of large intestine without perforation or abscess without bleeding: Secondary | ICD-10-CM | POA: Diagnosis not present

## 2022-07-25 DIAGNOSIS — E041 Nontoxic single thyroid nodule: Secondary | ICD-10-CM | POA: Diagnosis not present

## 2022-07-28 DIAGNOSIS — Z6829 Body mass index (BMI) 29.0-29.9, adult: Secondary | ICD-10-CM | POA: Diagnosis not present

## 2022-07-28 DIAGNOSIS — I10 Essential (primary) hypertension: Secondary | ICD-10-CM | POA: Diagnosis not present

## 2022-07-28 DIAGNOSIS — C9000 Multiple myeloma not having achieved remission: Secondary | ICD-10-CM | POA: Diagnosis not present

## 2022-07-28 DIAGNOSIS — E669 Obesity, unspecified: Secondary | ICD-10-CM | POA: Diagnosis not present

## 2022-08-08 DIAGNOSIS — C9001 Multiple myeloma in remission: Secondary | ICD-10-CM | POA: Diagnosis not present

## 2022-08-08 DIAGNOSIS — Z5111 Encounter for antineoplastic chemotherapy: Secondary | ICD-10-CM | POA: Diagnosis not present

## 2022-08-08 DIAGNOSIS — Z7901 Long term (current) use of anticoagulants: Secondary | ICD-10-CM | POA: Diagnosis not present

## 2022-08-08 DIAGNOSIS — C9000 Multiple myeloma not having achieved remission: Secondary | ICD-10-CM | POA: Diagnosis not present

## 2022-08-08 DIAGNOSIS — Z9484 Stem cells transplant status: Secondary | ICD-10-CM | POA: Diagnosis not present

## 2022-08-08 DIAGNOSIS — I10 Essential (primary) hypertension: Secondary | ICD-10-CM | POA: Diagnosis not present

## 2022-08-08 DIAGNOSIS — Z1159 Encounter for screening for other viral diseases: Secondary | ICD-10-CM | POA: Diagnosis not present

## 2022-08-09 DIAGNOSIS — Z1159 Encounter for screening for other viral diseases: Secondary | ICD-10-CM | POA: Diagnosis not present

## 2022-08-09 DIAGNOSIS — C9000 Multiple myeloma not having achieved remission: Secondary | ICD-10-CM | POA: Diagnosis not present

## 2022-08-09 DIAGNOSIS — Z5112 Encounter for antineoplastic immunotherapy: Secondary | ICD-10-CM | POA: Diagnosis not present

## 2022-08-09 DIAGNOSIS — Z9484 Stem cells transplant status: Secondary | ICD-10-CM | POA: Diagnosis not present

## 2022-09-05 DIAGNOSIS — F32A Depression, unspecified: Secondary | ICD-10-CM | POA: Diagnosis not present

## 2022-09-05 DIAGNOSIS — C9000 Multiple myeloma not having achieved remission: Secondary | ICD-10-CM | POA: Diagnosis not present

## 2022-09-05 DIAGNOSIS — Z7901 Long term (current) use of anticoagulants: Secondary | ICD-10-CM | POA: Diagnosis not present

## 2022-09-05 DIAGNOSIS — Z5111 Encounter for antineoplastic chemotherapy: Secondary | ICD-10-CM | POA: Diagnosis not present

## 2022-09-05 DIAGNOSIS — I10 Essential (primary) hypertension: Secondary | ICD-10-CM | POA: Diagnosis not present

## 2022-09-05 DIAGNOSIS — Z1159 Encounter for screening for other viral diseases: Secondary | ICD-10-CM | POA: Diagnosis not present

## 2022-09-05 DIAGNOSIS — Z9484 Stem cells transplant status: Secondary | ICD-10-CM | POA: Diagnosis not present

## 2022-09-05 DIAGNOSIS — C9001 Multiple myeloma in remission: Secondary | ICD-10-CM | POA: Diagnosis not present

## 2022-09-07 DIAGNOSIS — Z9484 Stem cells transplant status: Secondary | ICD-10-CM | POA: Diagnosis not present

## 2022-09-07 DIAGNOSIS — Z5112 Encounter for antineoplastic immunotherapy: Secondary | ICD-10-CM | POA: Diagnosis not present

## 2022-09-07 DIAGNOSIS — Z1159 Encounter for screening for other viral diseases: Secondary | ICD-10-CM | POA: Diagnosis not present

## 2022-09-07 DIAGNOSIS — C9000 Multiple myeloma not having achieved remission: Secondary | ICD-10-CM | POA: Diagnosis not present

## 2022-10-03 DIAGNOSIS — Z5111 Encounter for antineoplastic chemotherapy: Secondary | ICD-10-CM | POA: Diagnosis not present

## 2022-10-03 DIAGNOSIS — Z1159 Encounter for screening for other viral diseases: Secondary | ICD-10-CM | POA: Diagnosis not present

## 2022-10-03 DIAGNOSIS — C9002 Multiple myeloma in relapse: Secondary | ICD-10-CM | POA: Diagnosis not present

## 2022-10-03 DIAGNOSIS — F32A Depression, unspecified: Secondary | ICD-10-CM | POA: Diagnosis not present

## 2022-10-03 DIAGNOSIS — G893 Neoplasm related pain (acute) (chronic): Secondary | ICD-10-CM | POA: Diagnosis not present

## 2022-10-03 DIAGNOSIS — C9001 Multiple myeloma in remission: Secondary | ICD-10-CM | POA: Diagnosis not present

## 2022-10-03 DIAGNOSIS — C9000 Multiple myeloma not having achieved remission: Secondary | ICD-10-CM | POA: Diagnosis not present

## 2022-10-03 DIAGNOSIS — Z9484 Stem cells transplant status: Secondary | ICD-10-CM | POA: Diagnosis not present

## 2022-10-03 DIAGNOSIS — D801 Nonfamilial hypogammaglobulinemia: Secondary | ICD-10-CM | POA: Diagnosis not present

## 2022-10-05 DIAGNOSIS — C9000 Multiple myeloma not having achieved remission: Secondary | ICD-10-CM | POA: Diagnosis not present

## 2022-10-05 DIAGNOSIS — Z1159 Encounter for screening for other viral diseases: Secondary | ICD-10-CM | POA: Diagnosis not present

## 2022-10-05 DIAGNOSIS — Z9484 Stem cells transplant status: Secondary | ICD-10-CM | POA: Diagnosis not present

## 2022-10-05 DIAGNOSIS — Z5112 Encounter for antineoplastic immunotherapy: Secondary | ICD-10-CM | POA: Diagnosis not present

## 2022-11-01 DIAGNOSIS — C9001 Multiple myeloma in remission: Secondary | ICD-10-CM | POA: Diagnosis not present

## 2022-11-01 DIAGNOSIS — Z79899 Other long term (current) drug therapy: Secondary | ICD-10-CM | POA: Diagnosis not present

## 2022-11-01 DIAGNOSIS — R5383 Other fatigue: Secondary | ICD-10-CM | POA: Diagnosis not present

## 2022-11-01 DIAGNOSIS — C9000 Multiple myeloma not having achieved remission: Secondary | ICD-10-CM | POA: Diagnosis not present

## 2022-11-01 DIAGNOSIS — Z1159 Encounter for screening for other viral diseases: Secondary | ICD-10-CM | POA: Diagnosis not present

## 2022-11-01 DIAGNOSIS — Z5111 Encounter for antineoplastic chemotherapy: Secondary | ICD-10-CM | POA: Diagnosis not present

## 2022-11-01 DIAGNOSIS — Z9484 Stem cells transplant status: Secondary | ICD-10-CM | POA: Diagnosis not present

## 2022-11-01 DIAGNOSIS — D801 Nonfamilial hypogammaglobulinemia: Secondary | ICD-10-CM | POA: Diagnosis not present

## 2022-11-01 DIAGNOSIS — Z23 Encounter for immunization: Secondary | ICD-10-CM | POA: Diagnosis not present

## 2022-11-02 DIAGNOSIS — Z9484 Stem cells transplant status: Secondary | ICD-10-CM | POA: Diagnosis not present

## 2022-11-02 DIAGNOSIS — Z1159 Encounter for screening for other viral diseases: Secondary | ICD-10-CM | POA: Diagnosis not present

## 2022-11-02 DIAGNOSIS — Z79899 Other long term (current) drug therapy: Secondary | ICD-10-CM | POA: Diagnosis not present

## 2022-11-02 DIAGNOSIS — C9000 Multiple myeloma not having achieved remission: Secondary | ICD-10-CM | POA: Diagnosis not present

## 2022-11-07 DIAGNOSIS — I10 Essential (primary) hypertension: Secondary | ICD-10-CM | POA: Diagnosis not present

## 2022-11-12 DIAGNOSIS — R7301 Impaired fasting glucose: Secondary | ICD-10-CM | POA: Insufficient documentation

## 2022-11-12 DIAGNOSIS — D696 Thrombocytopenia, unspecified: Secondary | ICD-10-CM | POA: Insufficient documentation

## 2022-11-12 DIAGNOSIS — R778 Other specified abnormalities of plasma proteins: Secondary | ICD-10-CM | POA: Insufficient documentation

## 2022-11-13 DIAGNOSIS — K219 Gastro-esophageal reflux disease without esophagitis: Secondary | ICD-10-CM | POA: Diagnosis not present

## 2022-11-13 DIAGNOSIS — E041 Nontoxic single thyroid nodule: Secondary | ICD-10-CM | POA: Diagnosis not present

## 2022-11-13 DIAGNOSIS — E876 Hypokalemia: Secondary | ICD-10-CM | POA: Diagnosis not present

## 2022-11-13 DIAGNOSIS — I10 Essential (primary) hypertension: Secondary | ICD-10-CM | POA: Diagnosis not present

## 2022-11-13 DIAGNOSIS — Z86718 Personal history of other venous thrombosis and embolism: Secondary | ICD-10-CM | POA: Diagnosis not present

## 2022-11-13 DIAGNOSIS — J341 Cyst and mucocele of nose and nasal sinus: Secondary | ICD-10-CM | POA: Diagnosis not present

## 2022-11-13 DIAGNOSIS — E669 Obesity, unspecified: Secondary | ICD-10-CM | POA: Diagnosis not present

## 2022-11-13 DIAGNOSIS — R600 Localized edema: Secondary | ICD-10-CM | POA: Diagnosis not present

## 2022-11-13 DIAGNOSIS — E7800 Pure hypercholesterolemia, unspecified: Secondary | ICD-10-CM | POA: Diagnosis not present

## 2022-11-13 DIAGNOSIS — J329 Chronic sinusitis, unspecified: Secondary | ICD-10-CM | POA: Diagnosis not present

## 2022-11-13 DIAGNOSIS — N401 Enlarged prostate with lower urinary tract symptoms: Secondary | ICD-10-CM | POA: Diagnosis not present

## 2022-11-13 DIAGNOSIS — C9000 Multiple myeloma not having achieved remission: Secondary | ICD-10-CM | POA: Diagnosis not present

## 2022-11-29 DIAGNOSIS — Z1159 Encounter for screening for other viral diseases: Secondary | ICD-10-CM | POA: Diagnosis not present

## 2022-11-29 DIAGNOSIS — Z9484 Stem cells transplant status: Secondary | ICD-10-CM | POA: Diagnosis not present

## 2022-11-29 DIAGNOSIS — Z5111 Encounter for antineoplastic chemotherapy: Secondary | ICD-10-CM | POA: Diagnosis not present

## 2022-11-29 DIAGNOSIS — C9000 Multiple myeloma not having achieved remission: Secondary | ICD-10-CM | POA: Diagnosis not present

## 2022-11-30 DIAGNOSIS — Z1159 Encounter for screening for other viral diseases: Secondary | ICD-10-CM | POA: Diagnosis not present

## 2022-11-30 DIAGNOSIS — Z5111 Encounter for antineoplastic chemotherapy: Secondary | ICD-10-CM | POA: Diagnosis not present

## 2022-11-30 DIAGNOSIS — C9000 Multiple myeloma not having achieved remission: Secondary | ICD-10-CM | POA: Diagnosis not present

## 2022-11-30 DIAGNOSIS — Z9484 Stem cells transplant status: Secondary | ICD-10-CM | POA: Diagnosis not present

## 2022-12-11 DIAGNOSIS — C9000 Multiple myeloma not having achieved remission: Secondary | ICD-10-CM | POA: Diagnosis not present

## 2022-12-27 DIAGNOSIS — Z5111 Encounter for antineoplastic chemotherapy: Secondary | ICD-10-CM | POA: Diagnosis not present

## 2022-12-27 DIAGNOSIS — Z9484 Stem cells transplant status: Secondary | ICD-10-CM | POA: Diagnosis not present

## 2022-12-27 DIAGNOSIS — C9000 Multiple myeloma not having achieved remission: Secondary | ICD-10-CM | POA: Diagnosis not present

## 2022-12-27 DIAGNOSIS — Z1159 Encounter for screening for other viral diseases: Secondary | ICD-10-CM | POA: Diagnosis not present

## 2022-12-28 DIAGNOSIS — Z5112 Encounter for antineoplastic immunotherapy: Secondary | ICD-10-CM | POA: Diagnosis not present

## 2022-12-28 DIAGNOSIS — Z9484 Stem cells transplant status: Secondary | ICD-10-CM | POA: Diagnosis not present

## 2022-12-28 DIAGNOSIS — C9000 Multiple myeloma not having achieved remission: Secondary | ICD-10-CM | POA: Diagnosis not present

## 2022-12-28 DIAGNOSIS — Z1159 Encounter for screening for other viral diseases: Secondary | ICD-10-CM | POA: Diagnosis not present

## 2023-01-17 DIAGNOSIS — D225 Melanocytic nevi of trunk: Secondary | ICD-10-CM | POA: Diagnosis not present

## 2023-01-17 DIAGNOSIS — Z85828 Personal history of other malignant neoplasm of skin: Secondary | ICD-10-CM | POA: Diagnosis not present

## 2023-01-17 DIAGNOSIS — Z08 Encounter for follow-up examination after completed treatment for malignant neoplasm: Secondary | ICD-10-CM | POA: Diagnosis not present

## 2023-01-17 DIAGNOSIS — D485 Neoplasm of uncertain behavior of skin: Secondary | ICD-10-CM | POA: Diagnosis not present

## 2023-01-24 DIAGNOSIS — Z5111 Encounter for antineoplastic chemotherapy: Secondary | ICD-10-CM | POA: Diagnosis not present

## 2023-01-24 DIAGNOSIS — Z9484 Stem cells transplant status: Secondary | ICD-10-CM | POA: Diagnosis not present

## 2023-01-24 DIAGNOSIS — Z1159 Encounter for screening for other viral diseases: Secondary | ICD-10-CM | POA: Diagnosis not present

## 2023-01-24 DIAGNOSIS — C9000 Multiple myeloma not having achieved remission: Secondary | ICD-10-CM | POA: Diagnosis not present

## 2023-01-25 DIAGNOSIS — Z9484 Stem cells transplant status: Secondary | ICD-10-CM | POA: Diagnosis not present

## 2023-01-25 DIAGNOSIS — Z5111 Encounter for antineoplastic chemotherapy: Secondary | ICD-10-CM | POA: Diagnosis not present

## 2023-01-25 DIAGNOSIS — Z1159 Encounter for screening for other viral diseases: Secondary | ICD-10-CM | POA: Diagnosis not present

## 2023-01-25 DIAGNOSIS — C9000 Multiple myeloma not having achieved remission: Secondary | ICD-10-CM | POA: Diagnosis not present

## 2023-02-12 DIAGNOSIS — D225 Melanocytic nevi of trunk: Secondary | ICD-10-CM | POA: Diagnosis not present

## 2023-02-12 DIAGNOSIS — D485 Neoplasm of uncertain behavior of skin: Secondary | ICD-10-CM | POA: Diagnosis not present

## 2023-02-12 DIAGNOSIS — L905 Scar conditions and fibrosis of skin: Secondary | ICD-10-CM | POA: Diagnosis not present

## 2023-02-21 DIAGNOSIS — C9000 Multiple myeloma not having achieved remission: Secondary | ICD-10-CM | POA: Diagnosis not present

## 2023-02-21 DIAGNOSIS — Z1159 Encounter for screening for other viral diseases: Secondary | ICD-10-CM | POA: Diagnosis not present

## 2023-02-21 DIAGNOSIS — Z9484 Stem cells transplant status: Secondary | ICD-10-CM | POA: Diagnosis not present

## 2023-02-21 DIAGNOSIS — Z5112 Encounter for antineoplastic immunotherapy: Secondary | ICD-10-CM | POA: Diagnosis not present

## 2023-02-21 DIAGNOSIS — Z5111 Encounter for antineoplastic chemotherapy: Secondary | ICD-10-CM | POA: Diagnosis not present

## 2023-02-22 DIAGNOSIS — C9002 Multiple myeloma in relapse: Secondary | ICD-10-CM | POA: Diagnosis not present

## 2023-02-22 DIAGNOSIS — Z5112 Encounter for antineoplastic immunotherapy: Secondary | ICD-10-CM | POA: Diagnosis not present

## 2023-02-22 DIAGNOSIS — Z9484 Stem cells transplant status: Secondary | ICD-10-CM | POA: Diagnosis not present

## 2023-02-22 DIAGNOSIS — Z1159 Encounter for screening for other viral diseases: Secondary | ICD-10-CM | POA: Diagnosis not present

## 2023-03-21 DIAGNOSIS — Z9484 Stem cells transplant status: Secondary | ICD-10-CM | POA: Diagnosis not present

## 2023-03-21 DIAGNOSIS — Z1159 Encounter for screening for other viral diseases: Secondary | ICD-10-CM | POA: Diagnosis not present

## 2023-03-21 DIAGNOSIS — Z5111 Encounter for antineoplastic chemotherapy: Secondary | ICD-10-CM | POA: Diagnosis not present

## 2023-03-21 DIAGNOSIS — C9000 Multiple myeloma not having achieved remission: Secondary | ICD-10-CM | POA: Diagnosis not present

## 2023-03-22 DIAGNOSIS — Z9484 Stem cells transplant status: Secondary | ICD-10-CM | POA: Diagnosis not present

## 2023-03-22 DIAGNOSIS — Z5111 Encounter for antineoplastic chemotherapy: Secondary | ICD-10-CM | POA: Diagnosis not present

## 2023-03-22 DIAGNOSIS — Z1159 Encounter for screening for other viral diseases: Secondary | ICD-10-CM | POA: Diagnosis not present

## 2023-03-22 DIAGNOSIS — Z5112 Encounter for antineoplastic immunotherapy: Secondary | ICD-10-CM | POA: Diagnosis not present

## 2023-03-22 DIAGNOSIS — C9000 Multiple myeloma not having achieved remission: Secondary | ICD-10-CM | POA: Diagnosis not present

## 2023-04-18 DIAGNOSIS — Z1159 Encounter for screening for other viral diseases: Secondary | ICD-10-CM | POA: Diagnosis not present

## 2023-04-18 DIAGNOSIS — G629 Polyneuropathy, unspecified: Secondary | ICD-10-CM | POA: Diagnosis not present

## 2023-04-18 DIAGNOSIS — Z5111 Encounter for antineoplastic chemotherapy: Secondary | ICD-10-CM | POA: Diagnosis not present

## 2023-04-18 DIAGNOSIS — Z9484 Stem cells transplant status: Secondary | ICD-10-CM | POA: Diagnosis not present

## 2023-04-18 DIAGNOSIS — C9000 Multiple myeloma not having achieved remission: Secondary | ICD-10-CM | POA: Diagnosis not present

## 2023-04-19 DIAGNOSIS — Z5111 Encounter for antineoplastic chemotherapy: Secondary | ICD-10-CM | POA: Diagnosis not present

## 2023-04-19 DIAGNOSIS — C9000 Multiple myeloma not having achieved remission: Secondary | ICD-10-CM | POA: Diagnosis not present

## 2023-04-19 DIAGNOSIS — Z1159 Encounter for screening for other viral diseases: Secondary | ICD-10-CM | POA: Diagnosis not present

## 2023-04-19 DIAGNOSIS — Z9484 Stem cells transplant status: Secondary | ICD-10-CM | POA: Diagnosis not present

## 2023-04-19 DIAGNOSIS — Z5112 Encounter for antineoplastic immunotherapy: Secondary | ICD-10-CM | POA: Diagnosis not present

## 2023-05-09 DIAGNOSIS — I10 Essential (primary) hypertension: Secondary | ICD-10-CM | POA: Diagnosis not present

## 2023-05-15 DIAGNOSIS — J329 Chronic sinusitis, unspecified: Secondary | ICD-10-CM | POA: Diagnosis not present

## 2023-05-15 DIAGNOSIS — I10 Essential (primary) hypertension: Secondary | ICD-10-CM | POA: Diagnosis not present

## 2023-05-15 DIAGNOSIS — Z86718 Personal history of other venous thrombosis and embolism: Secondary | ICD-10-CM | POA: Diagnosis not present

## 2023-05-15 DIAGNOSIS — E876 Hypokalemia: Secondary | ICD-10-CM | POA: Diagnosis not present

## 2023-05-15 DIAGNOSIS — C9000 Multiple myeloma not having achieved remission: Secondary | ICD-10-CM | POA: Diagnosis not present

## 2023-05-15 DIAGNOSIS — J341 Cyst and mucocele of nose and nasal sinus: Secondary | ICD-10-CM | POA: Diagnosis not present

## 2023-05-15 DIAGNOSIS — R42 Dizziness and giddiness: Secondary | ICD-10-CM | POA: Insufficient documentation

## 2023-05-15 DIAGNOSIS — E041 Nontoxic single thyroid nodule: Secondary | ICD-10-CM | POA: Diagnosis not present

## 2023-05-15 DIAGNOSIS — N401 Enlarged prostate with lower urinary tract symptoms: Secondary | ICD-10-CM | POA: Diagnosis not present

## 2023-05-15 DIAGNOSIS — K219 Gastro-esophageal reflux disease without esophagitis: Secondary | ICD-10-CM | POA: Diagnosis not present

## 2023-05-15 DIAGNOSIS — Z0001 Encounter for general adult medical examination with abnormal findings: Secondary | ICD-10-CM | POA: Diagnosis not present

## 2023-05-15 DIAGNOSIS — E7800 Pure hypercholesterolemia, unspecified: Secondary | ICD-10-CM | POA: Diagnosis not present

## 2023-05-15 DIAGNOSIS — R600 Localized edema: Secondary | ICD-10-CM | POA: Diagnosis not present

## 2023-05-16 DIAGNOSIS — Z5111 Encounter for antineoplastic chemotherapy: Secondary | ICD-10-CM | POA: Diagnosis not present

## 2023-05-16 DIAGNOSIS — Z9484 Stem cells transplant status: Secondary | ICD-10-CM | POA: Diagnosis not present

## 2023-05-16 DIAGNOSIS — Z1159 Encounter for screening for other viral diseases: Secondary | ICD-10-CM | POA: Diagnosis not present

## 2023-05-16 DIAGNOSIS — C9000 Multiple myeloma not having achieved remission: Secondary | ICD-10-CM | POA: Diagnosis not present

## 2023-05-17 DIAGNOSIS — Z9484 Stem cells transplant status: Secondary | ICD-10-CM | POA: Diagnosis not present

## 2023-05-17 DIAGNOSIS — Z1159 Encounter for screening for other viral diseases: Secondary | ICD-10-CM | POA: Diagnosis not present

## 2023-05-17 DIAGNOSIS — Z5112 Encounter for antineoplastic immunotherapy: Secondary | ICD-10-CM | POA: Diagnosis not present

## 2023-05-17 DIAGNOSIS — C9000 Multiple myeloma not having achieved remission: Secondary | ICD-10-CM | POA: Diagnosis not present

## 2023-06-05 DIAGNOSIS — Z1283 Encounter for screening for malignant neoplasm of skin: Secondary | ICD-10-CM | POA: Diagnosis not present

## 2023-06-05 DIAGNOSIS — D225 Melanocytic nevi of trunk: Secondary | ICD-10-CM | POA: Diagnosis not present

## 2023-06-11 DIAGNOSIS — D701 Agranulocytosis secondary to cancer chemotherapy: Secondary | ICD-10-CM | POA: Diagnosis not present

## 2023-06-11 DIAGNOSIS — C9002 Multiple myeloma in relapse: Secondary | ICD-10-CM | POA: Diagnosis not present

## 2023-06-11 DIAGNOSIS — M549 Dorsalgia, unspecified: Secondary | ICD-10-CM | POA: Diagnosis not present

## 2023-06-11 DIAGNOSIS — C9000 Multiple myeloma not having achieved remission: Secondary | ICD-10-CM | POA: Diagnosis not present

## 2023-06-11 DIAGNOSIS — G629 Polyneuropathy, unspecified: Secondary | ICD-10-CM | POA: Diagnosis not present

## 2023-06-11 DIAGNOSIS — Z881 Allergy status to other antibiotic agents status: Secondary | ICD-10-CM | POA: Diagnosis not present

## 2023-06-11 DIAGNOSIS — Z880 Allergy status to penicillin: Secondary | ICD-10-CM | POA: Diagnosis not present

## 2023-06-11 DIAGNOSIS — T451X5A Adverse effect of antineoplastic and immunosuppressive drugs, initial encounter: Secondary | ICD-10-CM | POA: Diagnosis not present

## 2023-06-13 DIAGNOSIS — Z1159 Encounter for screening for other viral diseases: Secondary | ICD-10-CM | POA: Diagnosis not present

## 2023-06-13 DIAGNOSIS — C9000 Multiple myeloma not having achieved remission: Secondary | ICD-10-CM | POA: Diagnosis not present

## 2023-06-13 DIAGNOSIS — Z5111 Encounter for antineoplastic chemotherapy: Secondary | ICD-10-CM | POA: Diagnosis not present

## 2023-06-13 DIAGNOSIS — Z9484 Stem cells transplant status: Secondary | ICD-10-CM | POA: Diagnosis not present

## 2023-06-14 DIAGNOSIS — C9000 Multiple myeloma not having achieved remission: Secondary | ICD-10-CM | POA: Diagnosis not present

## 2023-06-14 DIAGNOSIS — Z5112 Encounter for antineoplastic immunotherapy: Secondary | ICD-10-CM | POA: Diagnosis not present

## 2023-06-14 DIAGNOSIS — Z9484 Stem cells transplant status: Secondary | ICD-10-CM | POA: Diagnosis not present

## 2023-06-25 DIAGNOSIS — M438X4 Other specified deforming dorsopathies, thoracic region: Secondary | ICD-10-CM | POA: Diagnosis not present

## 2023-06-25 DIAGNOSIS — C9000 Multiple myeloma not having achieved remission: Secondary | ICD-10-CM | POA: Diagnosis not present

## 2023-06-25 DIAGNOSIS — Z9889 Other specified postprocedural states: Secondary | ICD-10-CM | POA: Diagnosis not present

## 2023-06-25 DIAGNOSIS — M549 Dorsalgia, unspecified: Secondary | ICD-10-CM | POA: Diagnosis not present

## 2023-06-25 DIAGNOSIS — E041 Nontoxic single thyroid nodule: Secondary | ICD-10-CM | POA: Diagnosis not present

## 2023-07-11 DIAGNOSIS — Z5111 Encounter for antineoplastic chemotherapy: Secondary | ICD-10-CM | POA: Diagnosis not present

## 2023-07-11 DIAGNOSIS — Z9484 Stem cells transplant status: Secondary | ICD-10-CM | POA: Diagnosis not present

## 2023-07-11 DIAGNOSIS — C9000 Multiple myeloma not having achieved remission: Secondary | ICD-10-CM | POA: Diagnosis not present

## 2023-07-11 DIAGNOSIS — Z1159 Encounter for screening for other viral diseases: Secondary | ICD-10-CM | POA: Diagnosis not present

## 2023-07-12 DIAGNOSIS — C9000 Multiple myeloma not having achieved remission: Secondary | ICD-10-CM | POA: Diagnosis not present

## 2023-07-12 DIAGNOSIS — Z5112 Encounter for antineoplastic immunotherapy: Secondary | ICD-10-CM | POA: Diagnosis not present

## 2023-07-12 DIAGNOSIS — Z9484 Stem cells transplant status: Secondary | ICD-10-CM | POA: Diagnosis not present

## 2023-07-12 DIAGNOSIS — Z1159 Encounter for screening for other viral diseases: Secondary | ICD-10-CM | POA: Diagnosis not present

## 2023-07-19 DIAGNOSIS — M549 Dorsalgia, unspecified: Secondary | ICD-10-CM | POA: Diagnosis not present

## 2023-07-19 DIAGNOSIS — C9000 Multiple myeloma not having achieved remission: Secondary | ICD-10-CM | POA: Diagnosis not present

## 2023-07-19 DIAGNOSIS — M4856XA Collapsed vertebra, not elsewhere classified, lumbar region, initial encounter for fracture: Secondary | ICD-10-CM | POA: Diagnosis not present

## 2023-08-08 DIAGNOSIS — M549 Dorsalgia, unspecified: Secondary | ICD-10-CM | POA: Diagnosis not present

## 2023-08-08 DIAGNOSIS — Z1159 Encounter for screening for other viral diseases: Secondary | ICD-10-CM | POA: Diagnosis not present

## 2023-08-08 DIAGNOSIS — Z5111 Encounter for antineoplastic chemotherapy: Secondary | ICD-10-CM | POA: Diagnosis not present

## 2023-08-08 DIAGNOSIS — G8929 Other chronic pain: Secondary | ICD-10-CM | POA: Diagnosis not present

## 2023-08-08 DIAGNOSIS — G629 Polyneuropathy, unspecified: Secondary | ICD-10-CM | POA: Diagnosis not present

## 2023-08-08 DIAGNOSIS — Z9484 Stem cells transplant status: Secondary | ICD-10-CM | POA: Diagnosis not present

## 2023-08-08 DIAGNOSIS — C9000 Multiple myeloma not having achieved remission: Secondary | ICD-10-CM | POA: Diagnosis not present

## 2023-08-09 DIAGNOSIS — C9000 Multiple myeloma not having achieved remission: Secondary | ICD-10-CM | POA: Diagnosis not present

## 2023-08-09 DIAGNOSIS — Z1159 Encounter for screening for other viral diseases: Secondary | ICD-10-CM | POA: Diagnosis not present

## 2023-08-09 DIAGNOSIS — M546 Pain in thoracic spine: Secondary | ICD-10-CM | POA: Diagnosis not present

## 2023-08-09 DIAGNOSIS — Z9484 Stem cells transplant status: Secondary | ICD-10-CM | POA: Diagnosis not present

## 2023-08-09 DIAGNOSIS — G893 Neoplasm related pain (acute) (chronic): Secondary | ICD-10-CM | POA: Diagnosis not present

## 2023-08-09 DIAGNOSIS — Z5111 Encounter for antineoplastic chemotherapy: Secondary | ICD-10-CM | POA: Diagnosis not present

## 2023-08-09 NOTE — Progress Notes (Signed)
 Chief concern/reason for visit:  1. Multiple myeloma not having achieved remission      2. Cancer-related pain      Assessment/Plan:  Mr. [name] is a 68 year old with multiple myeloma initially diagnosed in 03/2013 ISS Stage III with standard risk genetics with elevated beta2-microglobulin treated with autologous stem cell trnasplant and maintenance therpay initially with progressionin 2019 and h/o kyphoplasty at T8 and T10 with persistent back pain that has been chronic with PET/CT on 06/25/2023 with FDG avidity at T6 with an enhancing 8 mm lesion adjacent to the posterior endplate of T6 correlating with his PET/CT. Myeloma labs pending.   Assessment/Plan:   -Multiple Myeloma Abnormal PET/CT finding and MRI finding at T6 spine with pain that is currently tolerable Awaiting myeloma labs obtained yesterday Discussed with Dr. Odella and Letty; will await labs to determine if the myeloma is secretory If non-secretory willing to offer radiation for pain relief at T6 (the area abnormal on PET/CT and MRI); it is possible it is not myeloma/plasmacytoma  If secretory will be managed by medical oncology  Back pain (Mid-Thoracic) Chronic pain level 3/10, associated with hyperintense lesion at T6, PET active. Lesion possibly myeloma or vascular. Prefers to wait before radiation therapy. - Continue fentanyl  patch and Tylenol  as needed (current pain management regimen). - Monitor pain levels and contact clinic if pain worsens.  -Per our discussion you would like to hold on radiation at this time but would be willing to re-consider if symptoms worsen or in a couple of months If radiation is to be used I would plan for 10 daily treatments (M-F) for two weeks and a radiation treatment planning session would be performed about a week earlier (called a CT simulation)      Subjective   HPI: History of Present Illness   Craig Rivera is a 68 year old male with multiple myeloma who presents with back  pain and abnormal imaging findings.  He experiences back pain rated at 3 out of 10, which has not worsened recently and is similar to his last visit, though slightly increased compared to a year ago. The pain is not severe enough to require regular Tylenol  use.  He underwent a PET scan and MRI due to concerns about his back pain and potential myeloma activity. The PET scan showed activity at T6, and the MRI revealed a hyperintense lesion at the posterior inferior endplate of T6.  His current medications include Pomalyst , Daratumumab , and dexamethasone  for myeloma, and he is on a 75 microgram Duragesic  patch for pain management. He occasionally takes Tylenol  and rarely uses oxycodone  for breakthrough pain. He also takes amlodipine, Lasix, Mucinex, hydralazine , Chloricon, Xyzal, Hyzaar, Protonix , Xarelto, Flomax, Valtrex, and Effexor .  He has a history of kyphoplasty at T8 and T10, and his pain management regimen was adjusted following these procedures. He has been stable on his current dose of Duragesic  for a couple of years after initially reducing it post-kyphoplasty.  No significant increase in pain and does not use pain medication daily.         Objective   BP 106/75   Temp 36.8 C (98.2 F) (Oral)   Resp 16   Wt 98 kg (216 lb)   SpO2 99%   BMI 30.99 kg/m    Physical Exam:   Physical Exam    Exam with no tnederness of the mid thoracic spien or posterior chest wall   I personally spent 28 minutes face-to-face and non-face-to-face in the care of  this patient, which includes all pre, intra, and post visit time on the date of service.  All documented time was specific to the E/M visit and does not include any procedures that may have been performed.

## 2023-09-05 DIAGNOSIS — Z923 Personal history of irradiation: Secondary | ICD-10-CM | POA: Diagnosis not present

## 2023-09-05 DIAGNOSIS — Z5111 Encounter for antineoplastic chemotherapy: Secondary | ICD-10-CM | POA: Diagnosis not present

## 2023-09-05 DIAGNOSIS — Z9484 Stem cells transplant status: Secondary | ICD-10-CM | POA: Diagnosis not present

## 2023-09-05 DIAGNOSIS — C9000 Multiple myeloma not having achieved remission: Secondary | ICD-10-CM | POA: Diagnosis not present

## 2023-09-05 DIAGNOSIS — Z1159 Encounter for screening for other viral diseases: Secondary | ICD-10-CM | POA: Diagnosis not present

## 2023-09-05 NOTE — Progress Notes (Signed)
 Craig Rivera, Craig Rivera, Craig Rivera  Hematology Oncology Consultation  Follow up Visit 09/05/2023   Consulting provider - Maude JAYSON Chimes, MD   Craig Norleen Folk, MD [address] [place] [address]        ASSESSMENT 09/05/2023   Encounter Diagnoses  Name Primary?  . Encounter for antineoplastic chemotherapy   . Stem cells transplant status      . Multiple myeloma not having achieved remission      . Encounter for screening for other viral diseases      PLAN/ RECOMMENDATION 09/05/2023 There are no findings on history, physical examination, or laboratory studies that would provide a contraindication to continued maintenance therapy with daratumumab  and pomalidomide  for the patient's multiple myeloma.  The needed orders have been prepared.  Appropriate monitoring laboratory studies are being obtained.  He will be scheduled for a return appointment in this clinic in 4 weeks when further daratumumab  will be given.      Review of Systems  Constitutional:  Negative for chills and unexpected weight change.  HENT:   Negative for hearing loss, mouth sores, nosebleeds, tinnitus and trouble swallowing.   Eyes:  Negative for eye problems and icterus.  Respiratory:  Negative for chest tightness, cough, hemoptysis, shortness of breath and wheezing.   Cardiovascular:  Negative for chest pain and palpitations.  Gastrointestinal:  Negative for abdominal distention, blood in stool and rectal pain.  Endocrine: Negative for hot flashes.  Genitourinary:  Negative for dysuria and hematuria.   Musculoskeletal:  Negative for back pain, gait problem, myalgias and neck stiffness.  Skin:  Negative for itching and rash.  Neurological:  Negative for gait problem, seizures and speech difficulty.  Hematological:  Negative for adenopathy. Does not bruise/bleed easily.  Psychiatric/Behavioral:  Negative for confusion. The patient is not nervous/anxious.     INTERVAL  STATUS/ CURRENT HISTORY- OV 09/05/2023  Craig Rivera is a 68 year-old gentleman diagnosed with Multiple Myeloma (IgG/Kappa) in 2015.   He received Revlimid, Velcade  and Dexamethasone  followed by Autologous Bone Marrow Transplantation.   He had a relapse in 2023. He has been treated with Pomalyst , Daratumumab  and Dexamethasone  with good response.     I have reviewed the laboratory, pathology, and radiology reports in detail and discussed findings with the patient.   Interval History:    02/21/2023 - The patient is clinically stable. He has neuropathy from previous therapy with Velcade . He will remain on maintenance therapy with Daratumumab  and Pomalyst . He will return for reevaluation in 1 month.    03/21/2023 - The patient has no significant complaints except for his chronic neuropathy. Lab work is stable. He will stay on maintenance therapy with Daratumumab  and Pomalyst . He will return for reevaluation in 1 month.    04/18/2023 - The patient is clinically stable. He will continue maintenance therapy with Daratumumab  and Pomalyst  for his Multiple Myeloma. He will return for evaluation of toxicity in 1 month.   05/16/2023 - Craig Rivera has a mild to moderate fatigue and a mild neuropathy. Both are stable. The lab work is also stable.  He will continue maintenance therapy for his Multiple Myeloma and return for reevaluation in a month.     06/13/2023 - The patient saw Dr. Homer at [facility] on 06/11/2023.  He was complaining of mid back pain and he recommended a PET scan for further evaluation. The pain is chronic but it is getting slightly worse over time. The test was ordered by me but it  has not been done yet.  He will continue on his present maintenance therapy for myeloma.  He will return for further evaluation in 1 month.    07/11/2023 - The patient is clinically stable but the back pain persists. The PET scan showed a new focus of mild FDG avidity involving the T6 vertebral body (SUV 4.5). No additional avid  lytic or sclerotic lesions. I will continue the present maintenance therapy but I will discuss with Dr. Odella about the new finding on the PET scan. In view of his pain maybe the best option would be considering RT for pain control.   I received a message from Dr. Odella later today. He agrees with RT to the T6 area. He also recommended an MRI of the thoracic spine for further evaluation.    08/08/2023 - The patient is clinically stable. He has not started palliative RT to the T-spine. The back pain is chronic.    The MRI of the T-spine confirmed the 8 mm lesion involving T6 but not compression fracture or epidural tumor. No other lesions.   He will continue the present maintenance therapy for his Myeloma and remain under surveillance.  09/05/2023: The patient was evaluated by Dr. Alm Blumenthal on August 09, 2023 for radiation therapy.  It was decided that radiotherapy could be utilized in the future, but it was not needed when he was seen.  His pain is adequately controlled with the fentanyl  patches that have been prescribed.  He tells me he that these are  needed for him to have a satisfactory quality of life.  The monoclonal gammopathy workup did not suggest progression of disease.  The kappa/lambda light chain ratio was within normal limits at 1.37.  No monoclonal protein was detected on immunofixation electrophoresis.  Treatment with continued maintenance daratumumab  has been scheduled for tomorrow.  He is also taking pomalidomide  for 21 days followed by 7 days off therapy.Craig Rivera              ECOG: 0 - Fully active, able to carry on all pre-disease performance without restriction       09/05/23 1110  BP: 112/73  Pulse: 59  Resp: 16  Temp: 36.9 C (98.4 F)  SpO2: 94%  Weight: 98.3 kg (216 lb 12.8 oz)     Vitals: reviewed  Physical Exam Constitutional:      Appearance: He is not ill-appearing.  HENT:     Head: Normocephalic and atraumatic.     Right Ear: External ear  normal.     Left Ear: External ear normal.     Nose: Nose normal. No rhinorrhea.     Mouth/Throat:     Mouth: Mucous membranes are moist.     Pharynx: No posterior oropharyngeal erythema.  Cardiovascular:     Rate and Rhythm: Regular rhythm.     Heart sounds: No murmur heard.    No friction rub. No gallop.  Pulmonary:     Effort: Pulmonary effort is normal.     Breath sounds: Normal breath sounds. No rhonchi.  Abdominal:     General: Abdomen is flat.     Palpations: There is no mass.     Tenderness: There is no guarding.  Musculoskeletal:        General: No tenderness. Normal range of motion.     Cervical back: Normal range of motion and neck supple.  Skin:    General: Skin is warm.     Coloration: Skin is not jaundiced.  Neurological:  Mental Status: He is alert and oriented to person, place, and time.     Cranial Nerves: No cranial nerve deficit.     Gait: Gait normal.  Psychiatric:        Mood and Affect: Mood normal.        Behavior: Behavior normal.      No visits with results within 1 Week(s) from this visit.  Latest known visit with results is:  Lab on 08/08/2023  Component Date Value Ref Range Status  . Phosphorus 08/08/2023 2.4  2.4 - 5.1 mg/dL Final  . Magnesium 92/97/7974 1.8  1.6 - 2.6 mg/dL Final  . Sodium 92/97/7974 139  135 - 145 mmol/L Final  . Potassium 08/08/2023 3.4 (L)  3.5 - 5.0 mmol/L Final  . Chloride 08/08/2023 99  98 - 107 mmol/L Final  . CO2 08/08/2023 32.1 (H)  21.0 - 32.0 mmol/L Final  . Anion Gap 08/08/2023 8  3 - 11 mmol/L Final  . BUN 08/08/2023 14  8 - 20 mg/dL Final  . Creatinine 92/97/7974 0.96  0.80 - 1.30 mg/dL Final  . BUN/Creatinine Ratio 08/08/2023 15   Final  . eGFR CKD-EPI (2021) Male 08/08/2023 86  >=60 mL/min/1.39m2 Final   eGFR calculated with CKD-EPI 2021 equation in accordance with [employer] and [employer] of Nephrology Task Force recommendations.  . Glucose 08/08/2023 136  70 - 179 mg/dL Final   . Calcium  08/08/2023 9.2  8.5 - 10.1 mg/dL Final  . Albumin 92/97/7974 3.8  3.5 - 5.0 g/dL Final  . Total Protein 08/08/2023 7.1  6.0 - 8.0 g/dL Final  . Total Bilirubin 08/08/2023 0.8  0.3 - 1.2 mg/dL Final  . AST 92/97/7974 13 (L)  15 - 40 U/L Final  . ALT 08/08/2023 15  12 - 78 U/L Final  . Alkaline Phosphatase 08/08/2023 65  46 - 116 U/L Final  . T Albumin 08/08/2023 4.1  3.5 - 5.0 g/dL Final  . Alpha-1 Globulin 08/08/2023 0.4  0.2 - 0.5 g/dL Final  . Alpha-2 Globulin 08/08/2023 0.7  0.5 - 1.1 g/dL Final  . Beta-1 Globulin 08/08/2023 0.5  0.3 - 0.6 g/dL Final  . Beta-2  Globulin 08/08/2023 0.3  0.2 - 0.6 g/dL Final  . Gammaglobulin 08/08/2023 0.8  0.5 - 1.5 g/dL Final  . SPE Interpretation 08/08/2023    Final   The SPE pattern demonstrates a slight irregularity in the gamma region, which may represent monoclonal protein. See Immunofixation report.  . Immunofixation Electrophoresis, Se* 08/08/2023    Final   No monoclonal protein detected.  . Total Protein 08/08/2023 6.8  g/dL Final  . Total IgG 92/97/7974 877  650 - 1,600 mg/dL Final  . IgM 92/97/7974 10 (L)  40 - 230 mg/dL Final  . IgA 92/97/7974 241.4  70.0 - 400.0 mg/dL Final  . Total Protein 08/08/2023 6.8  5.7 - 8.2 g/dL Final  . Kappa Free, Serum 08/08/2023 5.19 (H)  0.33 - 1.94 mg/dL Final  . Lambda Free, Serum 08/08/2023 3.80 (H)  0.57 - 2.63 mg/dL Final  . K/L FLC Ratio 08/08/2023 1.37  0.26 - 1.65 Final  . WBC 08/08/2023 4.1  4.0 - 10.5 10*9/L Final  . RBC 08/08/2023 4.48  4.10 - 5.60 10*12/L Final  . HGB 08/08/2023 15.0  12.5 - 17.0 g/dL Final  . HCT 92/97/7974 44.4  36.0 - 50.0 % Final  . MCV 08/08/2023 99.1 (H)  80.0 - 98.0 fL Final  . MCH 08/08/2023 33.5  27.0 -  34.0 pg Final  . MCHC 08/08/2023 33.8  32.0 - 36.0 g/dL Final  . RDW 92/97/7974 14.7 (H)  11.5 - 14.5 % Final  . MPV 08/08/2023 10.9 (H)  7.4 - 10.4 fL Final  . Platelet 08/08/2023 121 (L)  140 - 415 10*9/L Final  . Neutrophils % 08/08/2023 41  % Final   . Lymphocytes % 08/08/2023 34  % Final  . Monocytes % 08/08/2023 13  % Final  . Eosinophils % 08/08/2023 6  % Final  . Basophils % 08/08/2023 2  % Final  . Bands % 08/08/2023 4  % Final  . Absolute Neutrophils 08/08/2023 1.8  1.8 - 7.8 10*9/L Final  . Absolute Lymphocytes 08/08/2023 1.4  0.7 - 4.5 10*9/L Final  . Absolute Monocytes 08/08/2023 0.5  0.1 - 1.0 10*9/L Final  . Absolute Eosinophils 08/08/2023 0.2  0.0 - 0.4 10*9/L Final  . Absolute Basophils 08/08/2023 0.1  0.0 - 0.2 10*9/L Final     No results found for this visit on 09/05/23.  Medications Ordered Prior to Encounter[1]   Hematology/Oncology History Overview Note  Mr. Worthington initially presented with visual blurriness in one part of his visual field. He went to his ophthalmologist who noted inflammatory cells. He also had left lower extremity swelling at that time. His vision improved with eye drops after two days. His ophthalmologist sent chemistries which showed a total protein of over 11g with M-spike of 5.03g/dL and he was referred to a hematologist.  At presentation, Hgb 9.3, B2M 5.5, albumin 3.4. Normal creatinine and calcium . Normal bone survey. Bone marrow biopsy 03/26/2013 showed a hypercellular bone marrow (80%) with involvement by plasma cell neoplasm (68% plasma cells by aspirate differential), FISH showed copy number gains of chromosomes 7, 9, 15, and CCND1 (11q13).  He was treated with Velcade  (initially twice weekly x 4 weeks, then changed to weekly 04/30/13) and dexamethasone . Has had the last two weeks off due to neuropathy and urinary stream issues. A break in chemotherapy helped his neuropathy. His neuropathy has gotten to the point it does not go away between cycles. It occurs in his fingers and toes and is not painful and not associated with motor dysfunction.  His course has been complicated by left cheek swelling and tooth pain which was treated with Bactrim and he was referred to a dentist, who did an  evaluation and said his teeth looked okay. His symptoms have now resolved. He also has intermittent L> RLE peripheral edema. He also reports urinary stream slowness, which is mildly improved off chemotherapy. Has also had fatigue but this has improved. He continues to have visual acuity changes nearly daily. He thinks he has had a recent PSA drawn which was normal. He denies fevers. Occasional night sweats. Occasional constipation.   Multiple myeloma     03/18/2013 Initial Diagnosis   IgG kappa Multiple Myeloma   04/06/2013 -  Craig Staged   Staging form: Plasma Cell Myeloma and Plasma Cell Disorders, AJCC 8th Edition - Clinical stage from 04/06/2013: Beta-2 -microglobulin (mg/L): 5.5, Albumin (g/dL): 3.4, ISS: Stage III, High-risk cytogenetics: Absent, LDH: Not assessed - Signed by Dannielle Alm Locus, MD on 08/17/2023    09/14/2017 - 11/26/2017 Chemotherapy   OP MM BORTEZOMIB /DEXAMETHASONE  COMBINATION B bortezomib  1.3 mg/m2 days 1,4,8,11 dexamethasone  20 mg days 1,2,4,5,8,9,11,12    09/30/2018 - 01/06/2019 Chemotherapy   OP MM CARFILZOMIB  Carfilzomib  20 mg/m2 IV on Cycle 1 days 1, 2, then subsequent doses are 27 mg/m2 IV on days 1, 2, 8,  9, 15, 16 starting on Cycle 1 Day 8, every 28 days   01/17/2019 - 09/25/2019 Chemotherapy   OP MM CCD carfilzomib  20 mg/m2 IV on Days 1, 2 and 36 mg/m2 IV on Days 8, 9, 15, 16 for Cycle 1 followed by 36 mg/m2 IV on Days 1, 2, 8, 9, 15, 16 for subsequent cycles; cyclophosphamide 300 mg/m2 PO on Days 1, 8, 15; dexamethasone  40 mg PO on Days 1, 8, 15, 22; 28 day cycle   07/28/2020 - 07/28/2020 Chemotherapy   OP MM CCD carfilzomib  20 mg/m2 IV on Days 1, 2 and 36 mg/m2 IV on Days 8, 9, 15, 16 for Cycle 1 followed by 36 mg/m2 IV on Days 1, 2, 8, 9, 15, 16 for subsequent cycles; cyclophosphamide 300 mg/m2 PO on Days 1, 8, 15; dexamethasone  40 mg PO on Days 1, 8, 15, 22; 28 day cycle   02/28/2021 Progression    02/17/2021 hemoglobin of 12.3 with hematocrit of 35.3, serum  creatinine 1.16, serum calcium  9.0, elevated total protein to 9.5 g/dL, SPEP with monoclonal protein elevated to 2.9 g/dL.  Immunofixation confirms a monoclonal IgG kappa protein.   05/05/2021 -  Chemotherapy   OP MM DARATUMUMAB  [place] / POMALIDOMIDE  / DEXAMETHASONE  daratumumab -hyaluronidase  SQ weekly on cycles 1 to 2, and then every 2 weeks in cycles 3 to 6, and then every 4 weeks thereafter as monotherapy, pomalidomide  3 mg daily PO on days 1-21, dexamethasone  40 mg (20 mg if >70 yo) PO  on days 1, 8, 15, 22 for 6 cycles.    12/15/2021 Interval Scan(s)   Ultrasound Thyroid   Impression  -- Heterogeneous thyroid .  -- 1.1 cm isthmus TI-RADS 4 nodule; recommend follow up ultrasound in 1 year.  -- 1.9 cm right inferior thyroid  TI-RADS 3 nodule; recommend follow up ultrasound in 1 year.  -- Subcentimeter left mid thyroid  mixed cystic and solid nodule; no further follow up recommended.      ________________________________  TI-RADS 1 (0 points): Benign- No FNA indication  TI-RADS 2 (2 points): Not suspicious- No FNA indicated  TI-RADS 3 (3 points): Mildly suspicious- FNA is > or = 2.5 cm, follow if > or = 1.5 cm  TI-RADS 4 (4-6 points): Moderately suspicious- FNA if > or = 1.5 or follow if > or = 1.0 cm  TI-RADS 5 (7 or more points): Highly suspicious- FNA if >=1.0 cm, follow if >=0.5 cm  NOTE:  The TI-RADS classification of thyroid  nodules has been adopted to standardize risk stratification based on a common lexicon to inform practitioners about which nodules warrant biopsy.  The imaging criteria for TI-RADS criteria and documentation are available online at [email or web address].arnold.com/     07/25/2022 Interval Scan(s)   PET - CT  6/18/204  Interval increased avidity of the hypermetabolic slightly right-sided isthmus thyroid  nodule which is suspicious for malignancy. Recommend obtaining a new dedicated thyroid  ultrasound and fine-needle aspiration.    -No specific findings  suspicious for PET avid myeloma.     07/25/2022 Biopsy   Bone Marrow Biopsy   Bone marrow, right iliac, aspiration and biopsy -  Normocellular bone marrow (60%) with trilineage hematopoiesis -  No morphologic or overt immunophenotypic evidence of plasma cell neoplasm -  See linked reports for associated Ancillary Studies.       Stem cells transplant status     10/23/2013 Initial Diagnosis   Stem cells transplant status (CMS-HCC)   07/28/2020 - 07/28/2020 Chemotherapy   OP MM CCD carfilzomib  20 mg/m2 IV on Days  1, 2 and 36 mg/m2 IV on Days 8, 9, 15, 16 for Cycle 1 followed by 36 mg/m2 IV on Days 1, 2, 8, 9, 15, 16 for subsequent cycles; cyclophosphamide 300 mg/m2 PO on Days 1, 8, 15; dexamethasone  40 mg PO on Days 1, 8, 15, 22; 28 day cycle   05/05/2021 -  Chemotherapy   OP MM DARATUMUMAB  [place] / POMALIDOMIDE  / DEXAMETHASONE  daratumumab -hyaluronidase  SQ weekly on cycles 1 to 2, and then every 2 weeks in cycles 3 to 6, and then every 4 weeks thereafter as monotherapy, pomalidomide  3 mg daily PO on days 1-21, dexamethasone  40 mg (20 mg if >70 yo) PO  on days 1, 8, 15, 22 for 6 cycles.    12/15/2021 Interval Scan(s)   Ultrasound Thyroid   Impression  -- Heterogeneous thyroid .  -- 1.1 cm isthmus TI-RADS 4 nodule; recommend follow up ultrasound in 1 year.  -- 1.9 cm right inferior thyroid  TI-RADS 3 nodule; recommend follow up ultrasound in 1 year.  -- Subcentimeter left mid thyroid  mixed cystic and solid nodule; no further follow up recommended.      ________________________________  TI-RADS 1 (0 points): Benign- No FNA indication  TI-RADS 2 (2 points): Not suspicious- No FNA indicated  TI-RADS 3 (3 points): Mildly suspicious- FNA is > or = 2.5 cm, follow if > or = 1.5 cm  TI-RADS 4 (4-6 points): Moderately suspicious- FNA if > or = 1.5 or follow if > or = 1.0 cm  TI-RADS 5 (7 or more points): Highly suspicious- FNA if >=1.0 cm, follow if >=0.5 cm  NOTE:  The TI-RADS classification of  thyroid  nodules has been adopted to standardize risk stratification based on a common lexicon to inform practitioners about which nodules warrant biopsy.  The imaging criteria for TI-RADS criteria and documentation are available online at [email or web address].arnold.com/     07/25/2022 Interval Scan(s)   PET - CT  6/18/204  Interval increased avidity of the hypermetabolic slightly right-sided isthmus thyroid  nodule which is suspicious for malignancy. Recommend obtaining a new dedicated thyroid  ultrasound and fine-needle aspiration.    -No specific findings suspicious for PET avid myeloma.     07/25/2022 Biopsy   Bone Marrow Biopsy   Bone marrow, right iliac, aspiration and biopsy -  Normocellular bone marrow (60%) with trilineage hematopoiesis -  No morphologic or overt immunophenotypic evidence of plasma cell neoplasm -  See linked reports for associated Ancillary Studies.          I personally spent 30 minutes face-to-face and non-face-to-face in the care of this patient, which includes all pre, intra, and post visit time on the date of service.  All documented time was specific to the E/M visit and does not include any procedures that may have been performed.  In preparation of this visit, I reviewed onc history, past oncology visit notes, infusion history, past medical, social, family history.   This chart has been completed using Engineer[employer] civil (consulting) software, and while attempts have been made to ensure accuracy, certain words and phrases may not be transcribed as intended.    Maude JAYSON Chimes, MD  [facility] [phone]       [1] Current Outpatient Medications on File Prior to Visit  Medication Sig Dispense Refill  . acetaminophen  (TYLENOL ) 325 MG tablet Take 2 tablets (650 mg total) by mouth.    Craig Rivera amlodipine (NORVASC) 5 MG tablet Take 1 tablet (5 mg total) by mouth daily.    . furosemide (LASIX) 20 MG tablet Take  0.5 tablets (10 mg total) by  mouth daily as needed for swelling. 30 tablet 11  . guaiFENesin (MUCINEX) 600 mg 12 hr tablet Take 1 tablet (600 mg total) by mouth two (2) times a day as needed.    . hydrALAZINE  (APRESOLINE ) 25 MG tablet Take 1 tablet (25 mg total) by mouth two (2) times a day.    . KLOR-CON  M20 20 mEq ER tablet TAKE 1 TABLET BY MOUTH EVERY DAY 90 tablet 3  . levocetirizine (XYZAL) 5 MG tablet Take 1 tablet (5 mg total) by mouth daily.    Craig Rivera losartan-hydroCHLOROthiazide (HYZAAR) 50-12.5 mg per tablet Take 1 tablet by mouth daily.    . mupirocin (BACTROBAN) 2 % ointment APPLY TO AFFECTED AREAS 3 TIMES A DAY AS NEEDED    . oxyCODONE  (ROXICODONE ) 5 MG immediate release tablet Take 1 tablet (5 mg total) by mouth every four (4) hours as needed for pain.    . pantoprazole  (PROTONIX ) 40 MG tablet Take 1 tablet (40 mg total) by mouth daily. 90 tablet 1  . pomalidomide  (POMALYST ) 3 mg cap capsule Take 1 capsule by mouth once daily for 21 days, then 7 days off. 21 capsule 0  . rivaroxaban (XARELTO) 10 mg tablet TAKE 1 TABLET BY MOUTH EVERY DAY 30 tablet 1  . tamsulosin (FLOMAX) 0.4 mg capsule Take 1 capsule (0.4 mg total) by mouth nightly.    . valACYclovir (VALTREX) 500 MG tablet TAKE 1 TABLET BY MOUTH TWICE A DAY 180 tablet 0  . venlafaxine  (EFFEXOR -XR) 75 MG 24 hr capsule TAKE 1 CAPSULE BY MOUTH EVERY DAY 30 capsule 4   Current Facility-Administered Medications on File Prior to Visit  Medication Dose Route Frequency Provider Last Rate Last Admin  . DTaP-hepatitis B recombinant-IPV (PEDIARIX) 10 mcg-25Lf-25 mcg-10Lf/0.5 mL injection           . flu vacc [id number] 6mos up(PF) (FLULAVAL) 60 mcg (15 mcg x 4)/0.5 mL syringe           . haemophilus B polysac-tetanus toxoid (ActHIB) 10 mcg/0.5 mL injection           . measles, mumps and rubella vaccine (MMR) 1,000-12,500 TCID50/0.5 mL injection           . pneumococcal conjugate (13-valent) (PREVNAR-13) 0.5 mL vaccine           . pneumococcal polysacchride (23-valps)  (PNEUMOVAX) 25 mcg/0.5 mL vaccine

## 2023-09-07 DIAGNOSIS — C9000 Multiple myeloma not having achieved remission: Secondary | ICD-10-CM | POA: Diagnosis not present

## 2023-09-07 DIAGNOSIS — Z9484 Stem cells transplant status: Secondary | ICD-10-CM | POA: Diagnosis not present

## 2023-09-07 DIAGNOSIS — Z1159 Encounter for screening for other viral diseases: Secondary | ICD-10-CM | POA: Diagnosis not present

## 2023-09-07 DIAGNOSIS — Z5112 Encounter for antineoplastic immunotherapy: Secondary | ICD-10-CM | POA: Diagnosis not present

## 2023-09-23 ENCOUNTER — Encounter (HOSPITAL_COMMUNITY): Payer: Self-pay | Admitting: Emergency Medicine

## 2023-09-23 ENCOUNTER — Inpatient Hospital Stay (HOSPITAL_COMMUNITY)
Admission: EM | Admit: 2023-09-23 | Discharge: 2023-09-25 | DRG: 065 | Disposition: A | Discharge status: Home / Self Care | Attending: Neurology | Admitting: Neurology

## 2023-09-23 ENCOUNTER — Other Ambulatory Visit: Payer: Self-pay

## 2023-09-23 ENCOUNTER — Emergency Department (HOSPITAL_COMMUNITY)

## 2023-09-23 DIAGNOSIS — E669 Obesity, unspecified: Secondary | ICD-10-CM | POA: Diagnosis present

## 2023-09-23 DIAGNOSIS — I63512 Cerebral infarction due to unspecified occlusion or stenosis of left middle cerebral artery: Secondary | ICD-10-CM | POA: Diagnosis not present

## 2023-09-23 DIAGNOSIS — Z9221 Personal history of antineoplastic chemotherapy: Secondary | ICD-10-CM | POA: Diagnosis not present

## 2023-09-23 DIAGNOSIS — R4701 Aphasia: Secondary | ICD-10-CM | POA: Diagnosis not present

## 2023-09-23 DIAGNOSIS — I6602 Occlusion and stenosis of left middle cerebral artery: Secondary | ICD-10-CM | POA: Diagnosis not present

## 2023-09-23 DIAGNOSIS — C9000 Multiple myeloma not having achieved remission: Secondary | ICD-10-CM | POA: Diagnosis present

## 2023-09-23 DIAGNOSIS — Z79899 Other long term (current) drug therapy: Secondary | ICD-10-CM | POA: Diagnosis not present

## 2023-09-23 DIAGNOSIS — I63312 Cerebral infarction due to thrombosis of left middle cerebral artery: Principal | ICD-10-CM | POA: Diagnosis present

## 2023-09-23 DIAGNOSIS — I639 Cerebral infarction, unspecified: Principal | ICD-10-CM

## 2023-09-23 DIAGNOSIS — R29702 NIHSS score 2: Secondary | ICD-10-CM | POA: Diagnosis not present

## 2023-09-23 DIAGNOSIS — G459 Transient cerebral ischemic attack, unspecified: Secondary | ICD-10-CM | POA: Diagnosis not present

## 2023-09-23 DIAGNOSIS — F17210 Nicotine dependence, cigarettes, uncomplicated: Secondary | ICD-10-CM | POA: Diagnosis present

## 2023-09-23 DIAGNOSIS — I6389 Other cerebral infarction: Secondary | ICD-10-CM | POA: Diagnosis not present

## 2023-09-23 DIAGNOSIS — I10 Essential (primary) hypertension: Secondary | ICD-10-CM | POA: Diagnosis not present

## 2023-09-23 DIAGNOSIS — I4510 Unspecified right bundle-branch block: Secondary | ICD-10-CM | POA: Diagnosis present

## 2023-09-23 DIAGNOSIS — E7800 Pure hypercholesterolemia, unspecified: Secondary | ICD-10-CM | POA: Diagnosis present

## 2023-09-23 DIAGNOSIS — R001 Bradycardia, unspecified: Secondary | ICD-10-CM | POA: Diagnosis not present

## 2023-09-23 DIAGNOSIS — Z6830 Body mass index (BMI) 30.0-30.9, adult: Secondary | ICD-10-CM | POA: Diagnosis not present

## 2023-09-23 DIAGNOSIS — Z86718 Personal history of other venous thrombosis and embolism: Secondary | ICD-10-CM

## 2023-09-23 DIAGNOSIS — K219 Gastro-esophageal reflux disease without esophagitis: Secondary | ICD-10-CM | POA: Diagnosis present

## 2023-09-23 DIAGNOSIS — Z8673 Personal history of transient ischemic attack (TIA), and cerebral infarction without residual deficits: Secondary | ICD-10-CM | POA: Diagnosis not present

## 2023-09-23 DIAGNOSIS — Z7901 Long term (current) use of anticoagulants: Secondary | ICD-10-CM

## 2023-09-23 DIAGNOSIS — Z7401 Bed confinement status: Secondary | ICD-10-CM | POA: Diagnosis not present

## 2023-09-23 DIAGNOSIS — R519 Headache, unspecified: Secondary | ICD-10-CM | POA: Diagnosis not present

## 2023-09-23 DIAGNOSIS — R29701 NIHSS score 1: Secondary | ICD-10-CM | POA: Diagnosis not present

## 2023-09-23 DIAGNOSIS — R4182 Altered mental status, unspecified: Secondary | ICD-10-CM | POA: Diagnosis not present

## 2023-09-23 LAB — CBC WITH DIFFERENTIAL/PLATELET
Abs Immature Granulocytes: 0.01 K/uL (ref 0.00–0.07)
Basophils Absolute: 0.1 K/uL (ref 0.0–0.1)
Basophils Relative: 3 %
Eosinophils Absolute: 0.1 K/uL (ref 0.0–0.5)
Eosinophils Relative: 2 %
HCT: 41.5 % (ref 39.0–52.0)
Hemoglobin: 14.4 g/dL (ref 13.0–17.0)
Immature Granulocytes: 0 %
Lymphocytes Relative: 39 %
Lymphs Abs: 1.6 K/uL (ref 0.7–4.0)
MCH: 34 pg (ref 26.0–34.0)
MCHC: 34.7 g/dL (ref 30.0–36.0)
MCV: 98.1 fL (ref 80.0–100.0)
Monocytes Absolute: 0.3 K/uL (ref 0.1–1.0)
Monocytes Relative: 6 %
Neutro Abs: 2 K/uL (ref 1.7–7.7)
Neutrophils Relative %: 50 %
Platelets: 151 K/uL (ref 150–400)
RBC: 4.23 MIL/uL (ref 4.22–5.81)
RDW: 14.9 % (ref 11.5–15.5)
WBC: 4.1 K/uL (ref 4.0–10.5)
nRBC: 0 % (ref 0.0–0.2)

## 2023-09-23 LAB — COMPREHENSIVE METABOLIC PANEL WITH GFR
ALT: 13 U/L (ref 0–44)
AST: 11 U/L — ABNORMAL LOW (ref 15–41)
Albumin: 3.6 g/dL (ref 3.5–5.0)
Alkaline Phosphatase: 53 U/L (ref 38–126)
Anion gap: 9 (ref 5–15)
BUN: 15 mg/dL (ref 8–23)
CO2: 28 mmol/L (ref 22–32)
Calcium: 8.8 mg/dL — ABNORMAL LOW (ref 8.9–10.3)
Chloride: 96 mmol/L — ABNORMAL LOW (ref 98–111)
Creatinine, Ser: 1.13 mg/dL (ref 0.61–1.24)
GFR, Estimated: >60 mL/min (ref >60)
Glucose, Bld: 104 mg/dL — ABNORMAL HIGH (ref 70–99)
Potassium: 3.2 mmol/L — ABNORMAL LOW (ref 3.5–5.1)
Sodium: 133 mmol/L — ABNORMAL LOW (ref 135–145)
Total Bilirubin: 0.3 mg/dL (ref 0.0–1.2)
Total Protein: 6.5 g/dL (ref 6.5–8.1)

## 2023-09-23 LAB — PROTIME-INR
INR: 1.3 — ABNORMAL HIGH (ref 0.8–1.2)
Prothrombin Time: 16.6 s — ABNORMAL HIGH (ref 11.4–15.2)

## 2023-09-23 LAB — ETHANOL: Alcohol, Ethyl (B): <15 mg/dL (ref <15)

## 2023-09-23 LAB — APTT: aPTT: 31 s (ref 24–36)

## 2023-09-23 MED ORDER — SENNOSIDES-DOCUSATE SODIUM 8.6-50 MG PO TABS: 1.0000 | ORAL_TABLET | Freq: Every evening | ORAL | Status: DC | PRN

## 2023-09-23 MED ORDER — CHLORHEXIDINE GLUCONATE CLOTH 2 % EX PADS
6.0000 | MEDICATED_PAD | Freq: Every day | CUTANEOUS | Status: DC
  Administered 2023-09-24: 6 via TOPICAL

## 2023-09-23 MED ORDER — SODIUM CHLORIDE 0.9 % IV SOLN: INTRAVENOUS | Status: DC

## 2023-09-23 MED ORDER — ACETAMINOPHEN 650 MG RE SUPP
650.0000 mg | RECTAL | Status: DC | PRN
Start: 2023-09-23 — End: 2023-09-25

## 2023-09-23 MED ORDER — ACETAMINOPHEN 160 MG/5ML PO SOLN: 650.0000 mg | ORAL | Status: DC | PRN

## 2023-09-23 MED ORDER — ACETAMINOPHEN 325 MG PO TABS
650.0000 mg | ORAL_TABLET | ORAL | Status: DC | PRN
  Administered 2023-09-24 – 2023-09-25 (×3): 650 mg via ORAL
  Filled 2023-09-23 (×3): qty 2

## 2023-09-23 MED ORDER — STROKE: EARLY STAGES OF RECOVERY BOOK
Freq: Once | Status: AC
  Administered 2023-09-24: 1
  Filled 2023-09-23 (×2): qty 1

## 2023-09-23 MED ORDER — GADOBUTROL 1 MMOL/ML IV SOLN
10.0000 mL | Freq: Once | INTRAVENOUS | Status: AC | PRN
  Administered 2023-09-23: 10 mL via INTRAVENOUS

## 2023-09-23 NOTE — ED Notes (Signed)
 Stroke cart to bedside.

## 2023-09-23 NOTE — Consult Note (Signed)
 [facility] Consult Services   Patient Name:   Craig Rivera, Craig Rivera Date of Birth:   1955/05/04 Identification Number:   MRN - [id number] Date of Service:   09/23/2023 19:55:46  Diagnosis:       I63.00 - Cerebrovascular accident (CVA) due to thrombosis of precerebral artery (HCCC)       P36.687 - Cerebrovascular accident (CVA) due to thrombosis of left middle cerebral artery (HCCC)  Impression:        This is a 68 year old male with prior history of CVA. He has history of DVT on Xarelto multiple myeloma, hypertension and smoking.  Last known well was around 10 AM when he started having difficulty reading and comprehension. He eventually drove to the hospital by the time I saw him he has very mild aphasia with NIH stroke of 1.  During the ER visit he had an MRI and MRA head and neck he has a MCA occlusion in the proximal left M1 and acute strokes in the left temporal and parietal region of the left MCA distribution.  He did not qualify for thrombolytic as he is on Xarelto with last dose being today and also outside the window.  He has a LVO based on MRA however his symptoms right now are not disabling.    Hold Xarelto.  He will need echo with bubble study.  Tele.  DVT prophy per team choice.  He will be transferred for higher level of care in case if he gets worse.    Discussed with NIR/Neurologist Text: LVO in left MCA. No intervention now but he will be transferred for Observation in case if he get course. Fluid bolus and head of bed flat.  Our recommendations are outlined below.  Recommendations:        Stroke/Telemetry Floor       Neuro Checks       Bedside Swallow Eval       DVT Prophylaxis       IV Fluids, Normal Saline       Head of Bed 30 Degrees       Euglycemia and Avoid Hyperthermia (PRN Acetaminophen )       Antihypertensives PRN if Blood pressure is greater than 220/120 or there is a concern for End organ damage/contraindications for  permissive HTN. If blood pressure is greater than 220/120 give labetalol PO or IV or Vasotec IV with a goal of 15% reduction in BP during the first 24 hours.  Sign Out:       Discussed with Emergency Department Provider       Discussed with Rapid Response Team    ------------------------------------------------------------------------------  Advanced Imaging: CTA Head and Neck Completed.  LVO:Yes  Discussed with NIR :Yes  Initial Call Time To NIR : 09/23/2023 20:43:00  NIR Accept/Decision Time : 09/23/2023 20:50:31  Discussed with NIR Time : 09/23/2023 20:49:00  Discussed with NIR Text : LVO in left MCA. No intervention now but he will be transferred for Observation in case if he get course. Fluid bolus and head of bed flat.   Metrics: Last Known Well: 09/23/2023 10:00:00 Dispatch Time: 09/23/2023 19:55:45 Arrival Time: 09/23/2023 17:08:00 Initial Response Time: 09/23/2023 20:06:55 Symptoms: Aphasia. . Initial patient interaction: 09/23/2023 20:06:56 NIHSS Assessment Completed: 09/23/2023 20:11:00 Patient is not a candidate for Thrombolytic. Thrombolytic Medical Decision: 09/23/2023 20:10:00 Patient was not deemed candidate for Thrombolytic because of following reasons: LKW outside 4.5 hr window. . Use of NOAC in last 48 hrs. .  CT Head: I personally reviewed all  the CT images that were available to me and it showed: MRI was done as in HPI  Primary Provider Notified of Diagnostic Impression and Management Plan on: 09/23/2023 20:52:20    ------------------------------------------------------------------------------  History of Present Illness: Patient is a 68 year old Male.  Patient was brought by private transportation with symptoms of Aphasia. .  This is a 68 year old male with prior history of CVA. He has history of DVT on Xarelto multiple myeloma, hypertension and smoking. Last known well was around 10 AM when he started having difficulty reading and  comprehension. He eventually drove to the hospital by the time I saw him he has very mild aphasia with NIH stroke of 1. During the ER visit he had an MRI and MRA head and neck he has a MCA occlusion in the proximal left M1 and acute strokes in the left temporal and parietal region of the left MCA distribution. He did not qualify for thrombolytic as he is on Xarelto with last dose being today and also outside the window. He has a LVO based on MRA however his symptoms right now are not disabling   Past Medical History:      Hypertension      There is no history of Diabetes Mellitus      There is no history of Atrial Fibrillation      There is no history of Stroke  Medications:  Anticoagulant use:  Yes Xarelto No Antiplatelet use Reviewed EMR for current medications  Allergies:  Reviewed  Social History: Smoking: No Alcohol Use: No  Family History:  There is no family history of premature cerebrovascular disease pertinent to this consultation  ROS : 14 Points Review of Systems was performed and was negative except mentioned in HPI.  Past Surgical History: There Is No Surgical History Contributory To Today's Visit    Examination: BP(134/93), Pulse(45), 1A: Level of Consciousness - Alert; keenly responsive + 0 1B: Ask Month and Age - Both Questions Right + 0 1C: Blink Eyes & Squeeze Hands - Performs Both Tasks + 0 2: Test Horizontal Extraocular Movements - Normal + 0 3: Test Visual Fields - No Visual Loss + 0 4: Test Facial Palsy (Use Grimace if Obtunded) - Normal symmetry + 0 5A: Test Left Arm Motor Drift - No Drift for 10 Seconds + 0 5B: Test Right Arm Motor Drift - No Drift for 10 Seconds + 0 6A: Test Left Leg Motor Drift - No Drift for 5 Seconds + 0 6B: Test Right Leg Motor Drift - No Drift for 5 Seconds + 0 7: Test Limb Ataxia (FNF/Heel-Shin) - No Ataxia + 0 8: Test Sensation - Normal; No sensory loss + 0 9: Test Language/Aphasia - Mild-Moderate Aphasia: Some  Obvious Changes, Without Significant Limitation + 1 10: Test Dysarthria - Normal + 0 11: Test Extinction/Inattention - No abnormality + 0  NIHSS Score: 1   Pre-Morbid Modified Rankin Scale: 0 Points = No symptoms at all  Spoke with : ER physician.  This consult was conducted in real time using interactive audio and Immunologist. Patient was informed of the technology being used for this visit and agreed to proceed. Patient located in hospital and provider located at home/office setting.   Patient is being evaluated for possible acute neurologic impairment and high probability of imminent or life-threatening deterioration. I spent total of 60 minutes providing care to this patient, including time for face to face visit via telemedicine, review of medical records, imaging studies and discussion  of findings with providers, the patient and/or family.    Dr Damariz Paganelli   [facility] For Inpatient follow-up with [facility] physician please call [facility] at [phone]. As we are not an outpatient service for any post hospital discharge needs please contact the hospital for assistance. If you have any questions for the [facility] physicians or need to reconsult for clinical or diagnostic changes please contact us  via [facility] at [phone].   Signature : Johnta Couts

## 2023-09-23 NOTE — ED Provider Notes (Signed)
 [place] EMERGENCY DEPARTMENT AT [facility] Provider Note   CSN: [id number] Arrival date & time: 09/23/23  1708     Patient presents with: Visual Field Change and Weakness   Craig Rivera is a 68 y.o. male.  {Add pertinent medical, surgical, social history, OB history to YEP:67052} Patient with a history of hypertension and multiple myeloma.  He stated around 10:00 he started having a headache and mild blurred vision and difficulty getting his words out or understanding things he was reading.  Patient states he feels pretty close to back to normal now.  The history is provided by the patient and medical records. No language interpreter was used.  Weakness Severity:  Mild Onset quality:  Sudden Timing:  Intermittent Progression:  Resolved Chronicity:  New Context: not alcohol use   Relieved by:  Nothing Worsened by:  Nothing Associated symptoms: dizziness   Associated symptoms: no abdominal pain, no chest pain, no cough, no diarrhea, no frequency, no headaches and no seizures        Prior to Admission medications   Medication Sig Start Date End Date Taking? Authorizing Provider  acetaminophen  (TYLENOL ) 325 MG tablet Take 650 mg by mouth every 6 (six) hours as needed for mild pain.     [provider]  acyclovir  (ZOVIRAX ) 200 MG capsule TAKE 2 CAPSULES BY MOUTH TWICE A DAY Patient not taking: Reported on 01/03/2022 04/18/17   Letha Truman ORN, NP  amLODipine (NORVASC) 10 MG tablet Take 10 mg by mouth daily.  05/25/10   [provider]  cyanocobalamin  (VITAMIN B12) 1000 MCG tablet Take 1,000 mcg by mouth daily.    [provider]  dexamethasone  (DECADRON ) 4 MG tablet PLEASE SEE ATTACHED FOR DETAILED DIRECTIONS    [provider]  fentaNYL  (DURAGESIC  - DOSED MCG/HR) 75 MCG/HR Place 1 patch (75 mcg total) onto the skin every other day. 07/04/17   Higgs, Vetta, MD  fluticasone  (FLONASE ) 50 MCG/ACT nasal spray Place 1-2 sprays into  both nostrils daily as needed for allergies. 07/19/13   [provider]  furosemide (LASIX) 20 MG tablet Take 10 mg by mouth daily as needed for fluid or edema. 07/21/21   [provider]  ondansetron  (ZOFRAN ) 8 MG tablet Take 8 mg by mouth every 12 (twelve) hours as needed for nausea or vomiting. 05/09/21   [provider]  oxyCODONE  (OXY IR/ROXICODONE ) 5 MG immediate release tablet Take 1 tablet (5 mg total) by mouth every 4 (four) hours as needed. pain 04/04/17   Letha Truman ORN, NP  pantoprazole  (PROTONIX ) 40 MG tablet Take 40 mg by mouth daily as needed (acid reflux). 11/03/21   [provider]  pomalidomide  (POMALYST ) 4 MG capsule Take 4 mg by mouth See admin instructions. Take 3 mg by mouth daily for 21 days, stop for 7 days and then repeat cycle 05/05/21   [provider]  Potassium Chloride  ER 20 MEQ TBCR Take 20 mEq by mouth daily.    [provider]  tamsulosin (FLOMAX) 0.4 MG CAPS capsule Take 0.4 mg by mouth daily. 12/26/17   [provider]  valACYclovir (VALTREX) 500 MG tablet Take 500 mg by mouth 2 (two) times daily. 12/08/21   [provider]  XARELTO 10 MG TABS tablet Take 10 mg by mouth daily. 11/25/21   [provider]    Allergies: Penicillins, Ceclor [cefaclor], Lenalidomide, and Pseudoephedrine hcl    Review of Systems  Constitutional:  Negative for appetite change and fatigue.  HENT:  Negative for congestion, ear discharge and sinus pressure.   Eyes:  Negative for discharge.  Respiratory:  Negative for cough.   Cardiovascular:  Negative for chest pain.  Gastrointestinal:  Negative for abdominal pain and diarrhea.  Genitourinary:  Negative for frequency and hematuria.  Musculoskeletal:  Negative for back pain.  Skin:  Negative for rash.  Neurological:  Positive for dizziness. Negative for seizures and headaches.       Difficulty understanding words he was reading  Psychiatric/Behavioral:   Negative for hallucinations.     Updated Vital Signs BP (!) 146/81   Pulse (!) 47   Temp 97.9 F (36.6 C) (Oral)   Resp 11   Ht 5' 10 (1.778 m)   Wt 97.5 kg   SpO2 91%   BMI 30.85 kg/m   Physical Exam Vitals and nursing note reviewed.  Constitutional:      Appearance: He is well-developed.  HENT:     Head: Normocephalic.     Nose: Nose normal.  Eyes:     General: No scleral icterus.    Conjunctiva/sclera: Conjunctivae normal.  Neck:     Thyroid : No thyromegaly.  Cardiovascular:     Rate and Rhythm: Normal rate and regular rhythm.     Heart sounds: No murmur heard.    No friction rub. No gallop.  Pulmonary:     Breath sounds: No stridor. No wheezing or rales.  Chest:     Chest wall: No tenderness.  Abdominal:     General: There is no distension.     Tenderness: There is no abdominal tenderness. There is no rebound.  Musculoskeletal:        General: Normal range of motion.     Cervical back: Neck supple.  Lymphadenopathy:     Cervical: No cervical adenopathy.  Skin:    Findings: No erythema or rash.  Neurological:     Mental Status: He is alert and oriented to person, place, and time.     Motor: No abnormal muscle tone.     Coordination: Coordination normal.  Psychiatric:        Behavior: Behavior normal.     (all labs ordered are listed, but only abnormal results are displayed) Labs Reviewed  COMPREHENSIVE METABOLIC PANEL WITH GFR - Abnormal; Notable for the following components:      Result Value   Sodium 133 (*)    Potassium 3.2 (*)    Chloride 96 (*)    Glucose, Bld 104 (*)    Calcium  8.8 (*)    AST 11 (*)    All other components within normal limits  PROTIME-INR - Abnormal; Notable for the following components:   Prothrombin Time 16.6 (*)    INR 1.3 (*)    All other components within normal limits  CBC WITH DIFFERENTIAL/PLATELET  ETHANOL  APTT  RAPID URINE DRUG SCREEN, HOSP PERFORMED    EKG: None  Radiology: MR ANGIO HEAD WO  CONTRAST Result Date: 09/23/2023 EXAM: MR Angiography Head without intravenous Contrast. 09/23/2023 06:38:51 PM TECHNIQUE: Magnetic resonance angiography images of the head without intravenous contrast. Multiplanar 2D and 3D reformatted images are provided for review. COMPARISON: None provided. CLINICAL HISTORY: Neuro deficit, acute, stroke suspected. Pt to the ED with complaints of trouble focusing, disorientation, headache, and trouble finding his words. Last known well 1015 today. Pt states he has an uncomfortable feeling in his chest. FINDINGS: ANTERIOR CIRCULATION: The left middle cerebral artery is occluded at its origin. There is moderate collateralization within  the anterior division territory but poor collateralization in the posterior left MCA territory. POSTERIOR CIRCULATION: No significant stenosis of the posterior cerebral arteries. No significant stenosis of the basilar artery. No significant stenosis of the vertebral arteries. No aneurysm. IMPRESSION: 1. Left middle cerebral artery occlusion at its origin. 2. Findings discussed with Dr. Genevra Orne at 7:15pm 09/23/23. Electronically signed by: Franky Stanford MD 09/23/2023 07:16 PM EDT RP Workstation: [id number]   MR Angiogram Neck W or Wo Contrast Result Date: 09/23/2023 EXAM: MRA Neck without and with contrast 09/23/2023 06:38:51 PM TECHNIQUE: Multiplanar multisequence MRA of the neck was performed without and with the administration of 10mL intravenous contrast (gadobutrol  (GADAVIST ) 1 MMOL/ML injection 10 mL GADOBUTROL  1 MMOL/ML IV SOLN). 2D and 3D reformatted images are provided for review. Stenosis of the internal carotid arteries is measured using NASCET criteria. COMPARISON: None available CLINICAL HISTORY: Stroke/TIA, determine embolic source. Pt to the ED with complaints of trouble focusing, disorientation, headache, and trouble finding his words. Last known well 1015 today. Pt states he has an uncomfortable feeling in his chest. FINDINGS:  CAROTID ARTERIES: No dissection. No hemodynamically significant stenosis by NASCET criteria. VERTEBRAL ARTERIES: No dissection. No significant stenosis. IMPRESSION: 1. No hemodynamically significant stenosis or dissection of the neck arteries. Electronically signed by: Franky Stanford MD 09/23/2023 07:10 PM EDT RP Workstation: [id number]   MR BRAIN W WO CONTRAST Result Date: 09/23/2023 EXAM: MRI BRAIN WITH AND WITHOUT CONTRAST 09/23/2023 06:38:51 PM TECHNIQUE: Multiplanar multisequence MRI of the head/brain was performed with and without the administration of intravenous contrast. COMPARISON: None available. CLINICAL HISTORY: Mental status change, unknown cause. Pt to the ED with complaints of trouble focusing, disorientation, headache, and trouble finding his words. Last known well 1015 today. Pt states he has an uncomfortable feeling in his chest. FINDINGS: BRAIN AND VENTRICLES: Multifocal acute ischemia within the left temporal and parietal lobes. This is within the posterior left MCA territory and/or the left MCA/PCA watershed zone. Multifocal hyperintense T2-weighted signal within the cerebral white matter, most commonly due to chronic small vessel disease. No acute intracranial hemorrhage. No mass effect or midline shift. No hydrocephalus. The sella is unremarkable. Normal flow voids. No mass or abnormal enhancement. ORBITS: No acute abnormality. SINUSES: No acute abnormality. BONES AND SOFT TISSUES: Normal bone marrow signal and enhancement. No acute soft tissue abnormality. IMPRESSION: 1. Multifocal acute ischemia within the left temporal and parietal lobes. 2. Multifocal hyperintense T2-weighted signal within the cerebral white matter, most commonly due to chronic small vessel disease. Electronically signed by: Franky Stanford MD 09/23/2023 07:04 PM EDT RP Workstation: [id number]    {Document cardiac monitor, telemetry assessment procedure when appropriate:32947} Procedures   Medications Ordered in the  ED  0.9 %  sodium chloride  infusion (has no administration in time range)  gadobutrol  (GADAVIST ) 1 MMOL/ML injection 10 mL (10 mLs Intravenous Contrast Given 09/23/23 1828)   CRITICAL CARE Performed by: Fairy Sermon Total critical care time: 45 minutes Critical care time was exclusive of separately billable procedures and treating other patients. Critical care was necessary to treat or prevent imminent or life-threatening deterioration. Critical care was time spent personally by me on the following activities: development of treatment plan with patient and/or surrogate as well as nursing, discussions with consultants, evaluation of patient's response to treatment, examination of patient, obtaining history from patient or surrogate, ordering and performing treatments and interventions, ordering and review of laboratory studies, ordering and review of radiographic studies, pulse oximetry and re-evaluation of patient's condition.   I saw the  patient in triage and decided to get an MRI and MRA.  Results showed that the patient has an occlusion at the base of his left middle cerebral artery.  Also multiple acute ischemic changes within the left temporal and parietal lobes.  I got in touch with teleneurology and then we spoke with neurology Dr.Khaliqdina and the neurologist wanted the patient admitted over to [facility] to the neuro ICU.   {Click here for ABCD2, HEART and other calculators REFRESH Note before signing:1}                              Medical Decision Making Amount and/or Complexity of Data Reviewed Labs: ordered. Radiology: ordered. ECG/medicine tests: ordered.  Risk Prescription drug management.   Acute stroke with symptoms resolved.  Also patient has occlusion of the left MCA.  He will be admitted to [facility]  {Document critical care time when appropriate  Document review of labs and clinical decision tools ie CHADS2VASC2, etc  Document your independent  review of radiology images and any outside records  Document your discussion with family members, caretakers and with consultants  Document social determinants of health affecting pt's care  Document your decision making why or why not admission, treatments were needed:32947:::1}   Final diagnoses:  None    ED Discharge Orders     None

## 2023-09-23 NOTE — Progress Notes (Signed)
 eLink Physician-Brief Progress Note Patient Name: Craig Rivera DOB: [birth year] MRN: [id number]   Date of Service  09/23/2023  HPI/Events of Note  Presented with aphasia, MCA occlusion in the proximal left M1 and acute strokes in the left temporal and parietal region of the left MCA distribution.  Beyond window for tpa, no neuro IR intervention planned unless symptoms worse EKG : RBBB + LAHB, brady  eICU Interventions  Allow permissive hypertension Use hydrallazine prn if needed Xarelto on hold     Intervention Category Evaluation Type: New Patient Evaluation  [name] V. Seif Teichert 09/23/2023, 11:12 PM

## 2023-09-23 NOTE — ED Triage Notes (Signed)
 Pt to the ED with complaints of trouble focusing,disorientation, headache, and trouble finding his words.  [place] to triage 1717 for evaluation.  Last know well 1015 today.  Pt states he has an uncomfortable feeling in his chest.

## 2023-09-24 ENCOUNTER — Encounter (HOSPITAL_COMMUNITY): Payer: Self-pay | Admitting: Neurology

## 2023-09-24 ENCOUNTER — Inpatient Hospital Stay (HOSPITAL_COMMUNITY)

## 2023-09-24 ENCOUNTER — Other Ambulatory Visit (HOSPITAL_COMMUNITY): Payer: Self-pay

## 2023-09-24 ENCOUNTER — Telehealth (HOSPITAL_COMMUNITY): Payer: Self-pay | Admitting: Pharmacy Technician

## 2023-09-24 DIAGNOSIS — I63312 Cerebral infarction due to thrombosis of left middle cerebral artery: Secondary | ICD-10-CM

## 2023-09-24 DIAGNOSIS — Z86718 Personal history of other venous thrombosis and embolism: Secondary | ICD-10-CM

## 2023-09-24 DIAGNOSIS — I6389 Other cerebral infarction: Secondary | ICD-10-CM | POA: Diagnosis not present

## 2023-09-24 DIAGNOSIS — R29701 NIHSS score 1: Secondary | ICD-10-CM | POA: Diagnosis not present

## 2023-09-24 LAB — CBC WITH DIFFERENTIAL/PLATELET
Abs Immature Granulocytes: 0.01 K/uL (ref 0.00–0.07)
Basophils Absolute: 0.1 K/uL (ref 0.0–0.1)
Basophils Relative: 2 %
Eosinophils Absolute: 0 K/uL (ref 0.0–0.5)
Eosinophils Relative: 0 %
HCT: 38 % — ABNORMAL LOW (ref 39.0–52.0)
Hemoglobin: 13.3 g/dL (ref 13.0–17.0)
Immature Granulocytes: 0 %
Lymphocytes Relative: 27 %
Lymphs Abs: 1.2 K/uL (ref 0.7–4.0)
MCH: 34 pg (ref 26.0–34.0)
MCHC: 35 g/dL (ref 30.0–36.0)
MCV: 97.2 fL (ref 80.0–100.0)
Monocytes Absolute: 0.3 K/uL (ref 0.1–1.0)
Monocytes Relative: 7 %
Neutro Abs: 2.9 K/uL (ref 1.7–7.7)
Neutrophils Relative %: 64 %
Platelets: 132 K/uL — ABNORMAL LOW (ref 150–400)
RBC: 3.91 MIL/uL — ABNORMAL LOW (ref 4.22–5.81)
RDW: 14.8 % (ref 11.5–15.5)
WBC: 4.5 K/uL (ref 4.0–10.5)
nRBC: 0 % (ref 0.0–0.2)

## 2023-09-24 LAB — RAPID URINE DRUG SCREEN, HOSP PERFORMED
Amphetamines: NOT DETECTED
Barbiturates: NOT DETECTED
Benzodiazepines: NOT DETECTED
Cocaine: NOT DETECTED
Opiates: NOT DETECTED
Tetrahydrocannabinol: NOT DETECTED

## 2023-09-24 LAB — BASIC METABOLIC PANEL WITH GFR
Anion gap: 10 (ref 5–15)
BUN: 12 mg/dL (ref 8–23)
CO2: 27 mmol/L (ref 22–32)
Calcium: 8.8 mg/dL — ABNORMAL LOW (ref 8.9–10.3)
Chloride: 100 mmol/L (ref 98–111)
Creatinine, Ser: 1.08 mg/dL (ref 0.61–1.24)
GFR, Estimated: >60 mL/min (ref >60)
Glucose, Bld: 102 mg/dL — ABNORMAL HIGH (ref 70–99)
Potassium: 3.6 mmol/L (ref 3.5–5.1)
Sodium: 137 mmol/L (ref 135–145)

## 2023-09-24 LAB — ECHOCARDIOGRAM COMPLETE
Area-P 1/2: 2.29 cm2
Est EF: 55
Height: 70 in
S' Lateral: 3.56 cm
Weight: 3440 [oz_av]

## 2023-09-24 LAB — HEMOGLOBIN A1C
Hgb A1c MFr Bld: 5.1 % (ref 4.8–5.6)
Hgb A1c MFr Bld: 5.1 % (ref 4.8–5.6)
Mean Plasma Glucose: 99.67 mg/dL
Mean Plasma Glucose: 99.67 mg/dL

## 2023-09-24 LAB — LIPID PANEL
Cholesterol: 133 mg/dL (ref 0–200)
Cholesterol: 130 mg/dL (ref 0–200)
HDL: 23 mg/dL — ABNORMAL LOW (ref >40)
HDL: 25 mg/dL — ABNORMAL LOW (ref >40)
LDL Cholesterol: 87 mg/dL (ref 0–99)
LDL Cholesterol: 81 mg/dL (ref 0–99)
Total CHOL/HDL Ratio: 5.8 ratio
Total CHOL/HDL Ratio: 5.2 ratio
Triglycerides: 115 mg/dL (ref <150)
Triglycerides: 118 mg/dL (ref <150)
VLDL: 23 mg/dL (ref 0–40)
VLDL: 24 mg/dL (ref 0–40)

## 2023-09-24 LAB — MRSA NEXT GEN BY PCR, NASAL: MRSA by PCR Next Gen: NOT DETECTED

## 2023-09-24 LAB — GLUCOSE, CAPILLARY: Glucose-Capillary: 102 mg/dL — ABNORMAL HIGH (ref 70–99)

## 2023-09-24 LAB — HIV ANTIBODY (ROUTINE TESTING W REFLEX): HIV Screen 4th Generation wRfx: NONREACTIVE

## 2023-09-24 MED ORDER — POMALIDOMIDE 3 MG PO CAPS: 3.0000 mg | ORAL_CAPSULE | Freq: Every day | ORAL | Status: DC

## 2023-09-24 MED ORDER — SODIUM CHLORIDE 3 % IV SOLN: INTRAVENOUS | Status: DC

## 2023-09-24 MED ORDER — VENLAFAXINE HCL ER 75 MG PO CP24
75.0000 mg | ORAL_CAPSULE | Freq: Every day | ORAL | Status: DC
  Administered 2023-09-25: 75 mg via ORAL
  Filled 2023-09-24: qty 1

## 2023-09-24 MED ORDER — PANTOPRAZOLE SODIUM 40 MG PO TBEC
40.0000 mg | DELAYED_RELEASE_TABLET | Freq: Every day | ORAL | Status: DC
  Administered 2023-09-24 – 2023-09-25 (×2): 40 mg via ORAL
  Filled 2023-09-24 (×2): qty 1

## 2023-09-24 MED ORDER — FENTANYL 75 MCG/HR TD PT72: 1.0000 | MEDICATED_PATCH | TRANSDERMAL | Status: DC

## 2023-09-24 MED ORDER — FENTANYL 50 MCG/HR TD PT72
1.0000 | MEDICATED_PATCH | TRANSDERMAL | Status: DC
  Filled 2023-09-24: qty 1

## 2023-09-24 MED ORDER — HYDRALAZINE HCL 20 MG/ML IJ SOLN: 10.0000 mg | Freq: Four times a day (QID) | INTRAMUSCULAR | Status: DC | PRN

## 2023-09-24 MED ORDER — FLUTICASONE PROPIONATE 50 MCG/ACT NA SUSP: 1.0000 | Freq: Every day | NASAL | Status: DC | PRN

## 2023-09-24 MED ORDER — FENTANYL 50 MCG/HR TD PT72
1.0000 | MEDICATED_PATCH | TRANSDERMAL | Status: DC
  Administered 2023-09-24: 1 via TRANSDERMAL
  Filled 2023-09-24: qty 1

## 2023-09-24 MED ORDER — VITAMIN B-12 1000 MCG PO TABS
1000.0000 ug | ORAL_TABLET | Freq: Every day | ORAL | Status: DC
  Administered 2023-09-24 – 2023-09-25 (×2): 1000 ug via ORAL
  Filled 2023-09-24 (×2): qty 1

## 2023-09-24 MED ORDER — OXYCODONE HCL 5 MG PO TABS
5.0000 mg | ORAL_TABLET | ORAL | Status: DC | PRN
  Administered 2023-09-24 (×2): 5 mg via ORAL
  Filled 2023-09-24 (×2): qty 1

## 2023-09-24 MED ORDER — PANTOPRAZOLE SODIUM 40 MG PO TBEC: 40.0000 mg | DELAYED_RELEASE_TABLET | Freq: Every day | ORAL | Status: DC | PRN

## 2023-09-24 MED ORDER — APIXABAN 5 MG PO TABS
5.0000 mg | ORAL_TABLET | Freq: Two times a day (BID) | ORAL | Status: DC
  Administered 2023-09-24 – 2023-09-25 (×2): 5 mg via ORAL
  Filled 2023-09-24 (×2): qty 1

## 2023-09-24 MED ORDER — ATORVASTATIN CALCIUM 10 MG PO TABS
20.0000 mg | ORAL_TABLET | Freq: Every day | ORAL | Status: DC
  Administered 2023-09-24 – 2023-09-25 (×2): 20 mg via ORAL
  Filled 2023-09-24 (×2): qty 2

## 2023-09-24 NOTE — Telephone Encounter (Signed)
 Patient Product/process development scientist completed.    The patient is insured through [employer]/ [employer]. Patient has Medicare and is not eligible for a copay card, but may be able to apply for patient assistance or Medicare RX Payment Plan (Patient Must reach out to their plan, if eligible for payment plan), if available.    Ran test claim for Eliquis 5 mg and the current 30 day co-pay is $0.00.   This test claim was processed through [facility]- copay amounts may vary at other pharmacies due to pharmacy/plan contracts, or as the patient moves through the different stages of their insurance plan.     Craig Rivera, CPHT Pharmacy Technician III Certified Patient Advocate [facility] Pharm[facility] Patient Advocate Team Direct Number: [phone]  Fax: [phone]

## 2023-09-24 NOTE — Progress Notes (Signed)
 OT Cancellation Note  Patient Details Name: Craig Rivera MRN: [id number] DOB: 08-Jan-1956   Cancelled Treatment:    Reason Eval/Treat Not Completed: Active bedrest order  [name], OTD, OTR/L [facility] [phone]   [name] 09/24/2023, 7:17 AM

## 2023-09-24 NOTE — Progress Notes (Signed)
  Echocardiogram 2D Echocardiogram has been performed.  Tinnie FORBES Gosling RDCS 09/24/2023, 9:31 AM

## 2023-09-24 NOTE — Evaluation (Signed)
 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    PMH CVA, DVT on xarelto, multiple myeloma, HTN  Clinical Impression  Pt presents with mild RLE weakness vs L, R inattention, impaired higher level balance, and decreased activity tolerance vs baseline. Pt to benefit from acute PT to address deficits. Pt ambulated good hallway distance with min R inattention and drifting R noticeable, amenable to cuing to correct. PT anticipates pt will recover well functionally while acute, anticipate continued OP needs for other rehab disciplines but no PT follow up warranted at this time. PT to progress mobility as tolerated, and will continue to follow acutely.          If plan is discharge home, recommend the following:     Can travel by private vehicle        Equipment Recommendations None recommended by PT  Recommendations for Other Services       Functional Status Assessment Patient has had a recent decline in their functional status and demonstrates the ability to make significant improvements in function in a reasonable and predictable amount of time.     Precautions / Restrictions Precautions Precautions: Fall Precaution/Restrictions Comments: watch O2 (92% on RA at rest, drops to 87% during gait but not sustained and no DOE), HR (bradycardia 40s at rest) Restrictions Weight Bearing Restrictions Per Provider Order: No      Mobility  Bed Mobility               General bed mobility comments: OOB in chair    Transfers Overall transfer level: Needs assistance Equipment used: None Transfers: Sit to/from Stand Sit to Stand: Supervision           General transfer comment: slow to rise     Ambulation/Gait Ambulation/Gait assistance: Contact guard assist, Supervision Gait Distance (Feet): 350 Feet Assistive device: None Gait Pattern/deviations: Step-through pattern, Decreased stride length, Drifts right/left Gait velocity: decr     General Gait Details: drifts R and R inattention, initially close guard especially with challenge  Stairs            Wheelchair Mobility     Tilt Bed    Modified Rankin (Stroke Patients Only) Modified Rankin (Stroke Patients Only) Pre-Morbid Rankin Score: No symptoms Modified Rankin: Moderate disability     Balance Overall balance assessment: Needs assistance Sitting-balance support: Feet supported Sitting balance-Leahy Scale: Good     Standing balance support: During functional activity Standing balance-Leahy Scale: Fair Standing balance comment: veers R in hallway and min impairment (weaving of gait) with head turns                             Pertinent Vitals/Pain Pain Assessment Pain Assessment: No/denies pain    Home Living Family/patient expects to be discharged to:: Private residence Living Arrangements: Spouse/significant other Available Help at Discharge: Family Type of Home: House Home Access: Level entry       Home Layout: One level Home Equipment: [employer] - single point Additional Comments: has access to DME through church    Prior Function Prior Level of Function : Independent/Modified Independent;Driving  Extremity/Trunk Assessment   Upper Extremity Assessment Upper Extremity Assessment: Defer to OT evaluation RUE Deficits / Details: decr coordinatoin with finger to nose RUE Coordination: decreased fine motor    Lower Extremity Assessment Lower Extremity Assessment: Overall WFL for tasks assessed;RLE deficits/detail RLE Deficits / Details: 4/5 hip flexion and knee flexion, otherwise 5/5 and WFL sensation RLE Sensation: WNL    Cervical / Trunk  Assessment Cervical / Trunk Assessment: Normal  Communication   Communication Communication: Impaired Factors Affecting Communication: Difficulty expressing self    Cognition Arousal: Alert Behavior During Therapy: WFL for tasks assessed/performed   PT - Cognitive impairments: Awareness                       PT - Cognition Comments: R inattention but will attend when cued, word finding difficulties Following commands: Impaired Following commands impaired: Follows one step commands with increased time     Cueing Cueing Techniques: Verbal cues     General Comments General comments (skin integrity, edema, etc.): SpO2 87-89% on RA during gait, recovers to 91-94% on RA at rest    Exercises Other Exercises Other Exercises: PT reviewed BE FAST principles with pt   Assessment/Plan    PT Assessment Patient needs continued PT services  PT Problem List Decreased strength;Decreased mobility;Decreased activity tolerance;Decreased balance;Decreased knowledge of precautions       PT Treatment Interventions DME instruction;Therapeutic activities;Gait training;Therapeutic exercise;Patient/family education;Balance training;Stair training;Functional mobility training;Neuromuscular re-education    PT Goals (Current goals can be found in the Care Plan section)  Acute Rehab PT Goals Patient Stated Goal: home PT Goal Formulation: With patient Time For Goal Achievement: 10/08/23 Potential to Achieve Goals: Good    Frequency Min 2X/week     Co-evaluation               AM-PAC PT 6 Clicks Mobility  Outcome Measure Help needed turning from your back to your side while in a flat bed without using bedrails?: A Little Help needed moving from lying on your back to sitting on the side of a flat bed without using bedrails?: A Little Help needed moving to and from a bed to a chair (including a wheelchair)?: A Little Help needed standing up from a chair using your arms (e.g.,  wheelchair or bedside chair)?: A Little Help needed to walk in hospital room?: A Little Help needed climbing 3-5 steps with a railing? : A Little 6 Click Score: 18    End of Session Equipment Utilized During Treatment: Gait belt Activity Tolerance: Patient tolerated treatment well Patient left: with call bell/phone within reach;in chair;with chair alarm set Nurse Communication: Mobility status PT Visit Diagnosis: Other abnormalities of gait and mobility (R26.89);Muscle weakness (generalized) (M62.81)    Time: 8656-8587 PT Time Calculation (min) (ACUTE ONLY): 29 min   Charges:   PT Evaluation $PT Eval Low Complexity: 1 Low PT Treatments $Gait Training: 8-22 mins PT General Charges $$ ACUTE PT VISIT: 1 Visit         Johana RAMAN, PT DPT Acute Rehabilitation Services Secure Chat Preferred  Office [phone]   Tumeka Chimenti FORBES [place] 09/24/2023, 3:49 PM

## 2023-09-24 NOTE — TOC CM/SW Note (Signed)
 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 PTA, pt independent and living with wife who can provide assistance at dc.  PT/OT recommending OP follow up.      Transition of Care Asessment: Insurance and Status: Insurance coverage has been reviewed Patient has primary care physician: Yes Vilinda EUSTACE Hurst) Home environment has been reviewed: Lives with spouse Prior level of function:: Independent Prior/Current Home Services: No current home services Social Drivers of Health Review: SDOH reviewed no interventions necessary Readmission risk has been reviewed: Yes Transition of care needs: no transition of care needs at this time   Mliss MICAEL Fass, RN, BSN  Trauma/Neuro ICU Case Manager [phone]

## 2023-09-24 NOTE — Evaluation (Signed)
 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    PMH CVA, DVT on xarelto, multiple myeloma, HTN     Clinical Impressions Pt ind at baseline with ADLs/functional mobility, lives with spouse who can assist at d/c. Pt needing up to min A for ADLs, supervision for bed mobility and CGA for transfers without AD. Pt noted to have mild R inattention and decr coordination in RUE along with continued word finding difficulties. Pt presenting with impairments listed below, will follow acutely. Recommend OP OT at d/c.      If plan is discharge home, recommend the following:   A little help with walking and/or transfers;A little help with bathing/dressing/bathroom;Assistance with cooking/housework;Direct supervision/assist for medications management;Direct supervision/assist for financial management;Assist for transportation;Help with stairs or ramp for entrance;Supervision due to cognitive status     Functional Status Assessment   Patient has had a recent decline in their functional status and demonstrates the ability to make significant improvements in function in a reasonable and predictable amount of time.     Equipment Recommendations   Tub/shower seat     Recommendations for Other Services   PT consult     Precautions/Restrictions   Precautions Precautions: Fall Restrictions Weight Bearing Restrictions Per Provider Order: No     Mobility Bed Mobility Overal bed mobility: Needs Assistance Bed Mobility: Supine to Sit     Supine to sit: Supervision          Transfers Overall transfer level: Needs assistance Equipment used: None Transfers: Sit to/from Stand Sit to Stand: Contact guard assist                   Balance Overall balance assessment: Needs assistance Sitting-balance support: Feet supported Sitting balance-Leahy Scale: Good     Standing balance support: During functional activity Standing balance-Leahy Scale: Fair Standing balance comment: mild unsteadiness                           ADL either performed or assessed with clinical judgement   ADL Overall ADL's : Needs assistance/impaired Eating/Feeding: Set up;Sitting   Grooming: Oral care;Wash/dry face;Applying deodorant;Standing   Upper Body Bathing: Sitting;Minimal assistance   Lower Body Bathing: Minimal assistance;Sitting/lateral leans   Upper Body Dressing : Contact guard assist;Minimal assistance   Lower Body Dressing: Contact guard assist;Minimal assistance   Toilet Transfer: Contact guard assist;Ambulation   Toileting- Clothing Manipulation and Hygiene: Contact guard assist       Functional mobility during ADLs: Contact guard assist       Vision   Vision Assessment?: Yes Eye Alignment: Within Functional Limits Tracking/Visual Pursuits: Decreased smoothness of horizontal tracking Additional Comments: pt with difficutly discerning stimuli on R side when presented with stimuli bilaterally     Perception Perception: Not tested       Praxis Praxis: Not tested       Pertinent Vitals/Pain Pain Assessment Pain Assessment: No/denies pain     Extremity/Trunk Assessment Upper Extremity Assessment Upper Extremity Assessment: Right hand dominant;RUE deficits/detail RUE Deficits / Details: decr coordinatoin with finger to nose RUE Coordination: decreased fine motor   Lower Extremity Assessment Lower Extremity Assessment: Defer to PT evaluation   Cervical / Trunk Assessment Cervical /  Trunk Assessment: Normal   Communication Communication Communication: Impaired Factors Affecting Communication: Difficulty expressing self   Cognition Arousal: Alert Behavior During  Therapy: WFL for tasks assessed/performed Cognition: Difficult to assess Difficult to assess due to: Impaired communication           OT - Cognition Comments: continued wordfinding difficulties, follows commands with incr time, some slowed processing                 Following commands: Impaired Following commands impaired: Follows one step commands with increased time     Cueing  General Comments   Cueing Techniques: Verbal cues  VSS on RA   Exercises     Shoulder Instructions      Home Living Family/patient expects to be discharged to:: Private residence Living Arrangements: Spouse/significant other Available Help at Discharge: Family Type of Home: House Home Access: Level entry     Home Layout: One level     Bathroom Shower/Tub: Walk-in shower         Home Equipment: [employer] - single point   Additional Comments: has access to DME through church      Prior Functioning/Environment Prior Level of Function : Independent/Modified Independent;Driving                    OT Problem List: Decreased strength;Decreased range of motion;Decreased activity tolerance;Impaired vision/perception;Impaired balance (sitting and/or standing);Decreased cognition;Decreased safety awareness   OT Treatment/Interventions: Self-care/ADL training;Therapeutic exercise;Energy conservation;DME and/or AE instruction;Therapeutic activities;Balance training;Patient/family education;Visual/perceptual remediation/compensation;Cognitive remediation/compensation;Neuromuscular education      OT Goals(Current goals can be found in the care plan section)   Acute Rehab OT Goals Patient Stated Goal: none stated OT Goal Formulation: With patient Time For Goal Achievement: 10/08/23 Potential to Achieve Goals: Good ADL Goals Pt Will Perform Upper Body Dressing: with supervision;sitting Pt Will Perform Lower Body Dressing: with supervision;sitting/lateral leans;sit to/from stand Pt  Will Transfer to Toilet: with supervision;ambulating;regular height toilet Pt Will Perform Tub/Shower Transfer: Shower transfer;with supervision;ambulating Additional ADL Goal #1: pt will locate and attend to items on R side with min cues in prep for ADL   OT Frequency:  Min 2X/week    Co-evaluation              AM-PAC OT 6 Clicks Daily Activity     Outcome Measure Help from another person eating meals?: A Little Help from another person taking care of personal grooming?: A Little Help from another person toileting, which includes using toliet, bedpan, or urinal?: A Little Help from another person bathing (including washing, rinsing, drying)?: A Little Help from another person to put on and taking off regular upper body clothing?: A Little Help from another person to put on and taking off regular lower body clothing?: A Little 6 Click Score: 18   End of Session Equipment Utilized During Treatment: Gait belt Nurse Communication: Mobility status  Activity Tolerance: Patient tolerated treatment well Patient left: in chair;with call bell/phone within reach;with family/visitor present (handoff to PT)  OT Visit Diagnosis: Unsteadiness on feet (R26.81);Other abnormalities of gait and mobility (R26.89);Muscle weakness (generalized) (M62.81);Other symptoms and signs involving cognitive function;Cognitive communication deficit (R41.841)                Time: 8688-8657 OT Time Calculation (min): 31 min Charges:  OT General Charges $OT Visit: 1 Visit OT Evaluation $OT Eval Moderate Complexity: 1 Mod OT Treatments $Self Care/Home Management : 8-22 mins  Keydi Giel K, OTD, OTR/L SecureChat Preferred Acute Rehab [phone]  Dianara Smullen K [place]oon[place] 09/24/2023, 1:58 PM

## 2023-09-24 NOTE — Progress Notes (Addendum)
 STROKE TEAM PROGRESS NOTE   SIGNIFICANT HOSPITAL EVENTS 8/17: Presented to [facility] with aphasia after experiencing significant difficulty with reading comprehension and dysarthric speech. MRI demonstrated a left MCA occlusion at the origin with left temporoparietal infarcts in the MCA/PCA watershed distribution. Did not have CT or CTA. Patient was evaluated by teleneurology and transferred to [facility] for ICU monitoring.   SUBJECTIVE / INTERIM HISTORY Wife at bedside. Patient observed lying in bed. Continues to exhibit a mild right facial droop and speech paucity, though improved from yesterday, now able to read a paragraph aloud clearly. Successfully passed swallow evaluation and initiated on diet. Blood pressure remains stable, though on the higher side. Medication adjustment discussed with the patient, who agreed to transition from Xarelto to twice-daily Eliquis . Patient and wife were instructed to stop pomalidomide  3 MG due to stroke risks and to follow up outpatient with Oncologist.    OBJECTIVE  CBC    Component Value Date/Time   WBC 4.5 09/24/2023 0614   RBC 3.91 (L) 09/24/2023 0614   HGB 13.3 09/24/2023 0614   HCT 38.0 (L) 09/24/2023 0614   PLT 132 (L) 09/24/2023 0614   MCV 97.2 09/24/2023 0614   MCH 34.0 09/24/2023 0614   MCHC 35.0 09/24/2023 0614   RDW 14.8 09/24/2023 0614   LYMPHSABS 1.2 09/24/2023 0614   MONOABS 0.3 09/24/2023 0614   EOSABS 0.0 09/24/2023 0614   BASOSABS 0.1 09/24/2023 0614    BMET    Component Value Date/Time   NA 137 09/24/2023 0614   K 3.6 09/24/2023 0614   CL 100 09/24/2023 0614   CO2 27 09/24/2023 0614   GLUCOSE 102 (H) 09/24/2023 0614   BUN 12 09/24/2023 0614   CREATININE 1.08 09/24/2023 0614   CALCIUM  8.8 (L) 09/24/2023 0614   GFRNONAA >60 09/24/2023 0614    IMAGING past 24 hours MR ANGIO HEAD WO CONTRAST Result Date: 09/23/2023 EXAM: MR Angiography Head without intravenous Contrast. 09/23/2023 06:38:51 PM TECHNIQUE: Magnetic resonance  angiography images of the head without intravenous contrast. Multiplanar 2D and 3D reformatted images are provided for review. COMPARISON: None provided. CLINICAL HISTORY: Neuro deficit, acute, stroke suspected. Pt to the ED with complaints of trouble focusing, disorientation, headache, and trouble finding his words. Last known well 1015 today. Pt states he has an uncomfortable feeling in his chest. FINDINGS: ANTERIOR CIRCULATION: The left middle cerebral artery is occluded at its origin. There is moderate collateralization within the anterior division territory but poor collateralization in the posterior left MCA territory. POSTERIOR CIRCULATION: No significant stenosis of the posterior cerebral arteries. No significant stenosis of the basilar artery. No significant stenosis of the vertebral arteries. No aneurysm. IMPRESSION: 1. Left middle cerebral artery occlusion at its origin. 2. Findings discussed with Dr. Zammit at 7:15pm 09/23/23. Electronically signed by: Franky Stanford MD 09/23/2023 07:16 PM EDT RP Workstation: [id number]   MR Angiogram Neck W or Wo Contrast Result Date: 09/23/2023 EXAM: MRA Neck without and with contrast 09/23/2023 06:38:51 PM TECHNIQUE: Multiplanar multisequence MRA of the neck was performed without and with the administration of 10mL intravenous contrast (gadobutrol  (GADAVIST ) 1 MMOL/ML injection 10 mL GADOBUTROL  1 MMOL/ML IV SOLN). 2D and 3D reformatted images are provided for review. Stenosis of the internal carotid arteries is measured using NASCET criteria. COMPARISON: None available CLINICAL HISTORY: Stroke/TIA, determine embolic source. Pt to the ED with complaints of trouble focusing, disorientation, headache, and trouble finding his words. Last known well 1015 today. Pt states he has an uncomfortable feeling in his  chest. FINDINGS: CAROTID ARTERIES: No dissection. No hemodynamically significant stenosis by NASCET criteria. VERTEBRAL ARTERIES: No dissection. No significant  stenosis. IMPRESSION: 1. No hemodynamically significant stenosis or dissection of the neck arteries. Electronically signed by: Franky Stanford MD 09/23/2023 07:10 PM EDT RP Workstation: [id number]   MR BRAIN W WO CONTRAST Result Date: 09/23/2023 EXAM: MRI BRAIN WITH AND WITHOUT CONTRAST 09/23/2023 06:38:51 PM TECHNIQUE: Multiplanar multisequence MRI of the head/brain was performed with and without the administration of intravenous contrast. COMPARISON: None available. CLINICAL HISTORY: Mental status change, unknown cause. Pt to the ED with complaints of trouble focusing, disorientation, headache, and trouble finding his words. Last known well 1015 today. Pt states he has an uncomfortable feeling in his chest. FINDINGS: BRAIN AND VENTRICLES: Multifocal acute ischemia within the left temporal and parietal lobes. This is within the posterior left MCA territory and/or the left MCA/PCA watershed zone. Multifocal hyperintense T2-weighted signal within the cerebral white matter, most commonly due to chronic small vessel disease. No acute intracranial hemorrhage. No mass effect or midline shift. No hydrocephalus. The sella is unremarkable. Normal flow voids. No mass or abnormal enhancement. ORBITS: No acute abnormality. SINUSES: No acute abnormality. BONES AND SOFT TISSUES: Normal bone marrow signal and enhancement. No acute soft tissue abnormality. IMPRESSION: 1. Multifocal acute ischemia within the left temporal and parietal lobes. 2. Multifocal hyperintense T2-weighted signal within the cerebral white matter, most commonly due to chronic small vessel disease. Electronically signed by: Franky Stanford MD 09/23/2023 07:04 PM EDT RP Workstation: [id number]    Vitals:   09/24/23 0615 09/24/23 0630 09/24/23 0700 09/24/23 0800  BP:   (!) 151/88 (!) 145/82  Pulse: (!) 44 (!) 43 (!) 44 (!) 43  Resp: (!) 9 (!) 9 (!) 9 16  Temp:    98 F (36.7 C)  TempSrc:    Oral  SpO2: 95% 96% 96% 93%  Weight:      Height:          PHYSICAL EXAM General:  Alert, well-nourished, well-developed patient in no acute distress CV: Regular rhythm and bradycardic on monitor Respiratory:  Regular, unlabored respirations on room air   NEURO:  Mental Status: AA&Ox3, patient is able to give clear and coherent history Speech/Language: Slightly hesitant speech and takes pauses to find some word. Overall speech fluency is still preserved with mild paraphasic errors. Naming, repetition, fluency, and comprehension intact.  Cranial Nerves:  II: PERRL. Visual fields full.  III, IV, VI: EOMI. Eyelids elevate symmetrically.  V: Sensation is intact to light touch and symmetrical to face.  VII: Slight R facial droop  VIII: hearing intact to voice. IX, X: Palate elevates symmetrically. Phonation is normal.  KP:Dynloizm shrug 5/5. XII: tongue is midline without fasciculations. Motor: 5/5 strength to all muscle groups tested.  Tone: is normal and bulk is normal Sensation- Intact to light touch bilaterally. Extinction absent to light touch to DSS.   Coordination: FTN intact bilaterally, HKS: no ataxia in BLE.No drift.  Gait- deferred  Most Recent NIH  Dizziness Present: No (08/18 1000) Headache Present: No (08/18 1000) Interval: Shift assessment (08/18 0900) Level of Consciousness (1a.)   : Alert, keenly responsive (08/18 1000) LOC Questions (1b. )   : Answers both questions correctly (08/18 1000) LOC Commands (1c. )   : Performs both tasks correctly (08/18 1000) Best Gaze (2. )  : Normal (08/18 1000) Visual (3. )  : No visual loss (08/18 1000) Facial Palsy (4. )    : Minor paralysis (08/18 1000)  Motor Arm, Left (5a. )   : No drift (08/18 1000) Motor Arm, Right (5b. ) : No drift (08/18 1000) Motor Leg, Left (6a. )  : No drift (08/18 1000) Motor Leg, Right (6b. ) : No drift (08/18 1000) Limb Ataxia (7. ): Absent (08/18 1000) Sensory (8. )  : Normal, no sensory loss (08/18 1000) Best Language (9. )  : Mild-to-moderate  aphasia (08/18 1000) Dysarthria (10. ): Normal (08/18 1000) Extinction/Inattention (11.)   : No Abnormality (08/18 1000) Complete NIHSS TOTAL: 2 (08/18 1000)    ASSESSMENT/PLAN Mr. Lamarkus Nebel. Bidinger is a 68 year old male with a history of DVT on Xarelto, GERD, hypertension, hyperlipidemia, and multiple myeloma on chronic opioids, admitted for new-onset difficulty with reading comprehension and dysarthric speech. Imaging revealed a left MCA occlusion at the origin with left temporoparietal infarcts in the MCA/PCA watershed distribution. NIHSS on admission was 1.  Today, the patient demonstrates clinical improvement with intact comprehension and fluency, though he continues to exhibit mild speech paucity and a subtle right facial droop. He reports taking Xarelto daily, but not consistently with meals, raising concern for reduced absorption and inadequate anticoagulation. Thrombectomy was not pursued initially given his mild symptoms (NIHSS 1) and would only be reconsidered if symptoms worsened or became disabling. Given his improvement, thrombectomy is not indicated at this time. Plan is to discontinue Xarelto and initiate Eliquis  twice daily. Swallow evaluation was passed, and diet has been initiated. Will await PT/OT assessment, with anticipated discharge tomorrow.  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  chronic small vessel disease. MRA head left middle cerebral artery occlusion at its origin. MRA neck unremarkable 2D Echo LVEF 55%  LDL 81 HgbA1c 5.1 UDS negative VTE prophylaxis - SCDs Xarelto (rivaroxaban) daily prior to admission, now on Eliquis  twice daily Therapy recommendations:  outpt OT Disposition: Pending  Hx of DVT On daily Xarelto 10mg  maintenance dose PTA Will start bid Eliquis  5 mg  Multiple Myeloma on  chemo Diagnosed in 2015, treated with chemotherapy and stem cell transplant Relapse in 2023, on Pomalyst , daratumumab , IVIG  Discussed with Dr. Lonn, given potential thromboembolic effect of pomalidomide , will hold off chemo at this time, and follow up with his oncologist at [facility] ASAP after discharge  Hypertension Home meds: Amlodipine 5 mg, losartan-hydrochlorothiazide 50-12.5 mg Stable now Avoid low BP given left MCA occlusion No need to treat if BP less than 180/105 for now Long-term BP goal normotensive  Hyperlipidemia Home meds: None LDL 87, goal < 70 On Lipitor 20 High intensity statin not indicated as no significant atherosclerosis on vessel imaging Continue statin on discharge  Tobacco Abuse Patient smokes 1/2 packs per day for 40 years Nicotine replacement therapy provided Patient is willing to quit  Other Stroke Risk Factors Obesity, Body mass index is 30.85 kg/m., BMI >/= 30 associated with increased stroke risk, recommend weight loss, diet and exercise as appropriate   Other Active Problems GERD  Hospital day # 1  Alan Maiden, MD PGY-1  ATTENDING NOTE: I reviewed above note and agree with the assessment and plan. Pt was seen and examined.   RN at bedside.  Patient lying bed, still has mild speech hesitancy and paraphasic errors, slight right facial droop but otherwise neuro intact.  Patient stroke likely due to left MCA occlusion, source unclear but patient has history of DVT on Xarelto  10 mg daily, and on multiple myeloma chemotherapy with medication which has thromboembolic potential.  Was started on Eliquis  5 mg twice daily and hold off chemotherapy at this time.  Patient will need follow-up with his oncologist at the [facility] ASAP after discharge.  Put on low-dose Lipitor.  Avoid low BP given left MCA occlusion.  Continue ICU monitoring for now.  For detailed assessment and plan, please refer to above as I have made changes wherever appropriate.   Ary Cummins,  MD PhD Stroke Neurology 09/24/2023 6:01 PM  This patient is critically ill due to left MCA stroke due to left MCA occlusion, history of DVT on AC, multiple myeloma chemotherapy with thromboembolic potential and at significant risk of neurological worsening, death form recurrent stroke, hemorrhagic transformation, PE. This patient's care requires constant monitoring of vital signs, hemodynamics, respiratory and cardiac monitoring, review of multiple databases, neurological assessment, discussion with family, other specialists and medical decision making of high complexity. I spent 30 minutes of neurocritical care time in the care of this patient.    To contact Stroke Continuity provider, please refer to [email or web address]. After hours, contact General Neurology

## 2023-09-24 NOTE — Progress Notes (Signed)
 Pt's home meds in bag at bedside with pt during admission.  This RN and Bard Blush, RN counted and logged medication per pharmacy log.  Meds taken down to pharmacy at 0007.  Pt receipt in pt chart.  Pt aware and agreeable to event.

## 2023-09-24 NOTE — TOC CAGE-AID Note (Signed)
 Transition of Care [facility]) - CAGE-AID Screening   Patient Details  Name: NYSIR FERGUSSON MRN: [id number] Date of Birth: [birth year]  [facility]) CM/SW Contact:    Taylr Meuth E Caitlynne Harbeck, LCSW Phone Number: 09/24/2023, 9:27 AM   Clinical Narrative:    CAGE-AID Screening:    Have You Ever Felt You Ought to Cut Down on Your Drinking or Drug Use?: No Have People Annoyed You By [employer] Your Drinking Or Drug Use?: No Have You Felt Bad Or Guilty About Your Drinking Or Drug Use?: No Have You Ever Had a Drink or Used Drugs First Thing In The Morning to Steady Your Nerves or to Get Rid of a Hangover?: No CAGE-AID Score: 0  Substance Abuse Education Offered: No

## 2023-09-24 NOTE — H&P (Signed)
 NEUROLOGY H&P NOTE   Date of service: September 24, 2023 Patient Name: Craig Rivera MRN:  [id number] DOB:  1955-02-24 Chief Complaint: L MCA stroke  History of Present Illness  Craig Rivera is a 68 y.o. male with hx of DVT on Xarelto, GERD, hypertension, hyperlipidemia, multiple myeloma on opioids who presents with mild aphasia.  Patient reports that he got up this morning and was at his baseline.  He went to Bible study around 10 AM in the morning.  He noted that he had significant difficulty reading.  There were nurses in the study group and they noted that he was having difficulty with his speech and requested that he come to the ED.  At 4 PM in the afternoon, at his wife's request, patient finally agreed to be evaluated at [facility] ED.  Wife drove him over to the ED.  Patient had MRI of the brain along with MRA of the head and neck which demonstrated left MCA occlusion at the origin along with left temporoparietal strokes in the MCA/PCA watershed distribution.  A code stroke was activated in the ED and patient was evaluated by teleneurology.  He was outside the window for TNKase at the time of arrival to the ED.  Given his symptoms are too mild, he was not deemed to be a candidate for thrombectomy.  However, he was monitored to [facility] for close monitoring and neurochecks.  With plan to consider thrombectomy in case if his symptoms worsen.  Last known well: 10 AM on 09/23/2023 Modified rankin score: 0-Completely asymptomatic and back to baseline post- stroke IV Thrombolysis: Not offered, outside the window.   Thrombectomy: Not at this time.  However being closely monitored and if his symptoms were to become disabling, he would then be a candidate for thrombectomy.  I had reviewed his images and discuss his case with Dr. Maude Pour with neurointerventional radiology to make him aware.  I have also discussed with patient and his wife.  I have concerns that if patient was  to worsen, he would lose his ability to understand language and therefore unable to consent.  Patient is agreeable to thrombectomy in case if he declines.  He understands the details of thrombectomy, the risks of hemorrhage as well as potential benefit.  NIHSS components Score: Comment  1a Level of Conscious 0[x]  1[]  2[]  3[]      1b LOC Questions 0[x]  1[]  2[]       1c LOC Commands 0[x]  1[]  2[]       2 Best Gaze 0[x]  1[]  2[]       3 Visual 0[x]  1[]  2[]  3[]      4 Facial Palsy 0[x]  1[]  2[]  3[]      5a Motor Arm - left 0[x]  1[]  2[]  3[]  4[]  UN[]    5b Motor Arm - Right 0[x]  1[]  2[]  3[]  4[]  UN[]    6a Motor Leg - Left 0[x]  1[]  2[]  3[]  4[]  UN[]    6b Motor Leg - Right 0[x]  1[]  2[]  3[]  4[]  UN[]    7 Limb Ataxia 0[x]  1[]  2[]  UN[]      8 Sensory 0[x]  1[]  2[]  UN[]      9 Best Language 0[]  1[x]  2[]  3[]      10 Dysarthria 0[x]  1[]  2[]  UN[]      11 Extinct. and Inattention 0[x]  1[]  2[]       TOTAL: 1      ROS  Comprehensive ROS performed and pertinent positives documented in the HPI  Past History   Past Medical History:  Diagnosis Date   Back pain    DVT (deep venous thrombosis) (HCC)    GERD (gastroesophageal reflux disease)    HTN (hypertension)    Hypercholesterolemia    Multiple myeloma (HCC)    Sciatic nerve pain    Past Surgical History:  Procedure Laterality Date   BONE MARROW TRANSPLANT     CATARACT EXTRACTION W/PHACO Right 08/11/2014   Procedure: CATARACT EXTRACTION PHACO AND INTRAOCULAR LENS PLACEMENT (IOC);  Surgeon: Dow JULIANNA Burke, MD;  Location: [facility];  Service: Ophthalmology;  Laterality: Right;  CDE 4.88   CATARACT EXTRACTION W/PHACO Left 10/26/2014   Procedure: CATARACT EXTRACTION PHACO AND INTRAOCULAR LENS PLACEMENT LEFT EYE CDE=8.64;  Surgeon: Dow JULIANNA Burke, MD;  Location: [facility];  Service: Ophthalmology;  Laterality: Left;   COLONOSCOPY  09/12/2010   Procedure: COLONOSCOPY;  Surgeon: Lamar CHRISTELLA Hollingshead, MD;  Location: [facility];  Service: Endoscopy;  Laterality: N/A;    COLONOSCOPY WITH PROPOFOL  N/A 01/06/2022   Procedure: COLONOSCOPY WITH PROPOFOL ;  Surgeon: Cindie Carlin POUR, DO;  Location: [facility];  Service: Endoscopy;  Laterality: N/A;  10:00am   inguinal hernia repair bilateral     as child    KNEE SURGERY Right    arthroscopy   POLYPECTOMY  01/06/2022   Procedure: POLYPECTOMY;  Surgeon: Cindie Carlin POUR, DO;  Location: [facility];  Service: Endoscopy;;   TONSILLECTOMY     Family History  Problem Relation Age of Onset   Colon polyps Father        passed away, [place]   Colon polyps Sister    Migraines Mother        deceased   Social History   Socioeconomic History   Marital status: Married    Spouse name: Not on file   Number of children: Not on file   Years of education: Not on file   Highest education level: Not on file  Occupational History   Not on file  Tobacco Use   Smoking status: Every Day    Current packs/day: 0.50    Average packs/day: 0.5 packs/day for 40.0 years (20.0 ttl pk-yrs)    Types: Cigarettes   Smokeless tobacco: Never  Vaping Use   Vaping status: Never Used  Substance and Sexual Activity   Alcohol use: No   Drug use: No   Sexual activity: Yes    Birth control/protection: None  Other Topics Concern   Not on file  Social History Narrative   Not on file   Social Drivers of Health   Financial Resource Strain: Low Risk  (04/25/2021)   Received from [facility]   Overall Financial Resource Strain (CARDIA)    Difficulty of Paying Living Expenses: Not hard at all  Food Insecurity: No Food Insecurity (04/25/2021)   Received from [facility]   Hunger Vital Sign    Within the past 12 months, you worried that your food would run out before you got the money to buy more.: Never true    Within the past 12 months, the food you bought just didn't last and you didn't have money to get more.: Never true  Transportation Needs: No Transportation Needs (04/25/2021)   Received from [facility] - Transportation    Lack of Transportation (Medical): No    Lack of Transportation (Non-Medical): No  Physical Activity: Inactive (04/19/2021)   Received from [facility]   Exercise Vital Sign    On average, how many days  per week do you engage in moderate to strenuous exercise (like a brisk walk)?: 0 days    On average, how many minutes do you engage in exercise at this level?: 0 min  Stress: No Stress Concern Present (04/25/2021)   Received from [facility] - Occupational Stress Questionnaire    Feeling of Stress : Not at all  Social Connections: Moderately Integrated (04/19/2021)   Received from [facility]   Social Connection and Isolation Panel    In a typical week, how many times do you talk on the phone with family, friends, or neighbors?: More than three times a week    How often do you get together with friends or relatives?: Twice a week    How often do you attend church or religious services?: More than 4 times per year    Do you belong to any clubs or organizations such as church groups, unions, fraternal or athletic groups, or school groups?: No    How often do you attend meetings of the clubs or organizations you belong to?: Never    Are you married, widowed, divorced, separated, never married, or living with a partner?: Married   Allergies  Allergen Reactions   Penicillins Diarrhea   Ceclor [Cefaclor] Diarrhea and Rash   Lenalidomide Rash   Pseudoephedrine Hcl Rash    Medications   Current Facility-Administered Medications:     stroke: early stages of recovery book, , Does not apply, Once, Normagene Harvie, MD   0.9 %  sodium chloride  infusion, , Intravenous, Continuous, Sonda Coppens, MD, Last Rate: 100 mL/hr at 09/24/23 0000, Infusion Verify at 09/24/23 0000   acetaminophen  (TYLENOL ) tablet 650 mg, 650 mg, Oral, Q4H PRN **OR** acetaminophen  (TYLENOL ) 160 MG/5ML solution 650 mg, 650 mg, Per Tube, Q4H  PRN **OR** acetaminophen  (TYLENOL ) suppository 650 mg, 650 mg, Rectal, Q4H PRN, Gordie Belvin, MD   Chlorhexidine  Gluconate Cloth 2 % PADS 6 each, 6 each, Topical, Daily, Arien Benincasa, MD   cyanocobalamin  (VITAMIN B12) tablet 1,000 mcg, 1,000 mcg, Oral, Daily, Duc Crocket, MD   fentaNYL  (DURAGESIC ) 50 MCG/HR 1 patch, 1 patch, Transdermal, Q72H, Kaijah Abts, MD   fluticasone  (FLONASE ) 50 MCG/ACT nasal spray 1-2 spray, 1-2 spray, Each Nare, Daily PRN, Chaseton Yepiz, MD   oxyCODONE  (Oxy IR/ROXICODONE ) immediate release tablet 5 mg, 5 mg, Oral, Q4H PRN, Darrio Bade, MD   pantoprazole  (PROTONIX ) EC tablet 40 mg, 40 mg, Oral, Daily PRN, Cato Liburd, MD   senna-docusate (Senokot-S) tablet 1 tablet, 1 tablet, Oral, QHS PRN, Kilan Banfill, MD   Vitals   Vitals:   09/23/23 2200 09/23/23 2207 09/23/23 2300 09/24/23 0000  BP: (!) 151/92  (!) 183/105 (!) 148/85  Pulse: (!) 45 (!) 49 (!) 56 (!) 45  Resp: 12 11 15  (!) 8  Temp: 98.1 F (36.7 C)  98.2 F (36.8 C)   TempSrc: Oral  Oral   SpO2: (!) 87% 94% 94% 96%  Weight:      Height:         Body mass index is 30.85 kg/m.  Physical Exam   General: Laying comfortably in bed; in no acute distress.  HENT: Normal oropharynx and mucosa. Normal external appearance of ears and nose.  Neck: Supple, no pain or tenderness  CV: No JVD. No peripheral edema.  Pulmonary: Symmetric Chest rise. Normal respiratory effort.  Abdomen: Soft to touch, non-tender.  Ext: No cyanosis, edema, or deformity  Skin: No rash. Normal  palpation of skin.   Musculoskeletal: Normal digits and nails by inspection. No clubbing.   Neurologic Examination  Mental status/Cognition: Alert, oriented to self, place, month and year, good attention.  Speech/language: Fluent, comprehension intact, object naming intact, repetition intact.  Slightly hesitant speech and takes pauses to find some word.  Overall speech fluency is still  preserved. Cranial nerves:   CN II Pupils equal and reactive to light, no VF deficits    CN III,IV,VI EOM intact, no gaze preference or deviation, no nystagmus    CN V normal sensation in V1, V2, and V3 segments bilaterally    CN VII no asymmetry, no nasolabial fold flattening    CN VIII normal hearing to speech    CN IX & X normal palatal elevation, no uvular deviation    CN XI 5/5 head turn and 5/5 shoulder shrug bilaterally    CN XII midline tongue protrusion    Motor:  Muscle bulk: normal, tone normal, pronator drift none tremor none Mvmt Root Nerve  Muscle Right Left Comments  SA C5/6 Ax Deltoid 5 5   EF C5/6 Mc Biceps 5 5   EE C6/7/8 Rad Triceps 5 5   WF C6/7 Med FCR     WE C7/8 PIN ECU     F Ab C8/T1 U ADM/FDI 5 5   HF L1/2/3 Fem Illopsoas 4+ 5   KE L2/3/4 Fem Quad 5 5   DF L4/5 D Peron Tib Ant 5 5   PF S1/2 Tibial Grc/Sol 5 5    Sensation:  Light touch Intact throughout   Pin prick    Temperature    Vibration   Proprioception    Coordination/Complex Motor:  - Finger to Nose intact bilaterally - Heel to shin intact bilaterally - Rapid alternating movement are normal - Gait: Deferred for patient safety. Labs   CBC:  Recent Labs  Lab 09/23/23 1742  WBC 4.1  NEUTROABS 2.0  HGB 14.4  HCT 41.5  MCV 98.1  PLT 151   Basic Metabolic Panel:  Lab Results  Component Value Date   NA 133 (L) 09/23/2023   K 3.2 (L) 09/23/2023   CO2 28 09/23/2023   GLUCOSE 104 (H) 09/23/2023   BUN 15 09/23/2023   CREATININE 1.13 09/23/2023   CALCIUM  8.8 (L) 09/23/2023   GFRNONAA >60 09/23/2023   GFRAA >60 04/12/2017   Lipid Panel: No results found for: LDLCALC HgbA1c: No results found for: HGBA1C Urine Drug Screen: No results found for: LABOPIA, COCAINSCRNUR, LABBENZ, AMPHETMU, THCU, LABBARB  Alcohol Level     Component Value Date/Time   [facility] <15 09/23/2023 1930   INR  Lab Results  Component Value Date   INR 1.3 (H) 09/23/2023   APTT  Lab Results   Component Value Date   APTT 31 09/23/2023    MRI Brain(Personally reviewed): 1. Multifocal acute ischemia within the left temporal and parietal lobes. 2. Multifocal hyperintense T2-weighted signal within the cerebral white matter, most commonly due to chronic small vessel disease.  MR angio of the head without contrast and MR angio of the neck with and without contrast(personally reviewed): 1. Left middle cerebral artery occlusion at its origin.  Assessment   Craig Rivera is a 68 y.o. male with hx of DVT on Xarelto, GERD, hypertension, hyperlipidemia, multiple myeloma on opioids who presents with mild aphasia.  He was found to have left MCA occlusion at the origin along with multifocal stroke noted in left temporoparietal lobes in the left MCA/PCA  watershed distribution.  Patient was outside the window for TNK at the time of arrival.  He is not a candidate for thrombectomy at this time as he is too mild to treat.  However being closely monitored and if his symptoms were to become disabling, he would then be a candidate for thrombectomy.  I had reviewed his images and discuss his case with Dr. Maude Pour with neurointerventional radiology to make him aware.  I have also discussed with patient and his wife.  I have concerns that if patient was to worsen, he would lose his ability to understand language and therefore unable to consent.  Patient at this time, is agreeable to thrombectomy in case if he declines.  He understands the details of thrombectomy, the risks of hemorrhage as well as potential benefit.  Primary Diagnosis:  Cerebral infarction due to thrombosis of left middle cerebral artery.   Recommendations  Acute left MCA stroke with left MCA occlusion at the origin: - Frequent Neuro checks per Neuro ICU protocol. - Recommend obtaining TTE - Recommend obtaining Lipid panel with LDL - Please start statin if LDL > 70 - Recommend HbA1c to evaluate for diabetes and how well it is  controlled. - Will do Heparin  gtt neuro scale. - SBP goal - permissive hypertension first 24 h < 220/110. Hold home meds. Head of bed flat, strict bed rest and fluids at 163ml/hr. - Recommend Telemetry monitoring for arrythmia - Recommend bedside swallow screen prior to PO intake. - Stroke education booklet - Recommend PT/OT/SLP consult - Recommend Urine Tox screen. - will take for thrombectomy if he develops disabling deficits.  ______________________________________________________________________  CODE STATUS discussed with patient and wife and patient is full code.  Allergies verified and updated in the chart.  This patient is critically ill and at significant risk of neurological worsening, death and care requires constant monitoring of vital signs, hemodynamics,respiratory and cardiac monitoring, neurological assessment, discussion with family, other specialists and medical decision making of high complexity. I spent 70 minutes of neurocritical care time  in the care of  this patient. This was time spent independent of any time provided by nurse practitioner or PA.  Any Mcneice [facility] 09/24/2023  1:13 AM   Signed, Raef Sprigg, MD [facility]  Risks, benefits and alternatives of IVT discussed with patient and/or family and they agreed.

## 2023-09-25 ENCOUNTER — Other Ambulatory Visit: Payer: Self-pay | Admitting: Neurology

## 2023-09-25 DIAGNOSIS — R29702 NIHSS score 2: Secondary | ICD-10-CM

## 2023-09-25 DIAGNOSIS — I639 Cerebral infarction, unspecified: Secondary | ICD-10-CM

## 2023-09-25 DIAGNOSIS — I63512 Cerebral infarction due to unspecified occlusion or stenosis of left middle cerebral artery: Secondary | ICD-10-CM

## 2023-09-25 LAB — CBC
HCT: 36.6 % — ABNORMAL LOW (ref 39.0–52.0)
Hemoglobin: 12.8 g/dL — ABNORMAL LOW (ref 13.0–17.0)
MCH: 33.9 pg (ref 26.0–34.0)
MCHC: 35 g/dL (ref 30.0–36.0)
MCV: 96.8 fL (ref 80.0–100.0)
Platelets: 127 K/uL — ABNORMAL LOW (ref 150–400)
RBC: 3.78 MIL/uL — ABNORMAL LOW (ref 4.22–5.81)
RDW: 14.6 % (ref 11.5–15.5)
WBC: 4.4 K/uL (ref 4.0–10.5)
nRBC: 0 % (ref 0.0–0.2)

## 2023-09-25 LAB — BASIC METABOLIC PANEL WITH GFR
Anion gap: 8 (ref 5–15)
BUN: 12 mg/dL (ref 8–23)
CO2: 28 mmol/L (ref 22–32)
Calcium: 8.7 mg/dL — ABNORMAL LOW (ref 8.9–10.3)
Chloride: 100 mmol/L (ref 98–111)
Creatinine, Ser: 0.89 mg/dL (ref 0.61–1.24)
GFR, Estimated: >60 mL/min (ref >60)
Glucose, Bld: 95 mg/dL (ref 70–99)
Potassium: 3.4 mmol/L — ABNORMAL LOW (ref 3.5–5.1)
Sodium: 136 mmol/L (ref 135–145)

## 2023-09-25 MED ORDER — ATORVASTATIN CALCIUM 20 MG PO TABS: 20.0000 mg | ORAL_TABLET | Freq: Every day | ORAL | 0 refills | Status: DC

## 2023-09-25 MED ORDER — APIXABAN 5 MG PO TABS: 5.0000 mg | ORAL_TABLET | Freq: Two times a day (BID) | ORAL | 0 refills | Status: DC

## 2023-09-25 NOTE — Evaluation (Signed)
 Speech Language Pathology Evaluation Patient Details Name: ALIOU MEALEY MRN: [id number] DOB: 1955/10/28 Today's Date: 09/25/2023 Time: 8948-8892 SLP Time Calculation (min) (ACUTE ONLY): 16 min  Problem List:  Patient Active Problem List   Diagnosis Date Noted   Acute ischemic left MCA stroke (HCC) 09/23/2023   Positive colorectal cancer screening using Cologuard test 12/09/2021   Hypokalemia 11/09/2015   Multiple myeloma (HCC) 07/22/2015   Encounter for screening colonoscopy 08/30/2010   Past Medical History:  Past Medical History:  Diagnosis Date   Back pain    DVT (deep venous thrombosis) (HCC)    GERD (gastroesophageal reflux disease)    HTN (hypertension)    Hypercholesterolemia    Multiple myeloma (HCC)    Sciatic nerve pain    Past Surgical History:  Past Surgical History:  Procedure Laterality Date   BONE MARROW TRANSPLANT     CATARACT EXTRACTION W/PHACO Right 08/11/2014   Procedure: CATARACT EXTRACTION PHACO AND INTRAOCULAR LENS PLACEMENT (IOC);  Surgeon: Dow JULIANNA Burke, MD;  Location: [facility];  Service: Ophthalmology;  Laterality: Right;  CDE 4.88   CATARACT EXTRACTION W/PHACO Left 10/26/2014   Procedure: CATARACT EXTRACTION PHACO AND INTRAOCULAR LENS PLACEMENT LEFT EYE CDE=8.64;  Surgeon: Dow JULIANNA Burke, MD;  Location: [facility];  Service: Ophthalmology;  Laterality: Left;   COLONOSCOPY  09/12/2010   Procedure: COLONOSCOPY;  Surgeon: Lamar CHRISTELLA Hollingshead, MD;  Location[facility];  Service: Endoscopy;  Laterality: N/A;   COLONOSCOPY WITH PROPOFOL  N/A 01/06/2022   Procedure: COLONOSCOPY WITH PROPOFOL ;  Surgeon: Cindie Carlin POUR, DO;  Location: [facility];  Service: Endoscopy;  Laterality: N/A;  10:00am   inguinal hernia repair bilateral     as child    KNEE SURGERY Right    arthroscopy   POLYPECTOMY  01/06/2022   Procedure: POLYPECTOMY;  Surgeon: Cindie Carlin POUR, DO;  Location: [facility];  Service: Endoscopy;;   TONSILLECTOMY     HPI:  Patient is a 68 y.o.  male with PMH: HTN, HLD, multiple myeloma on opiods who presented to the hospital on 09/23/23 with mild apasia. His wife drove him to the ED. MRI of brain showed Multifocal acute ischemia within the left temporal and parietal lobes. He passed [facility] swallow screen with RN and was started on a PO diet.   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 Specifically, he exhibits an anomic aphasia but he does exhibit good awarness to his deficits in communication, at one point saying, that was pitiful after perceived poor performance on divergent naming task. (named 7 animals in 60 seconds) He is able to communicate his basic wants/needs/thoughts well with minimal difficulty. Higher level receptive language skills did appear to be impaired and recommend further evaluation at next venue of care. As plan is for patient to discharge home today, SLP recommending f/u via OP SLP. Patient in agreement with this plan.    SLP Assessment  SLP Recommendation/Assessment: All further Speech Language Pathology needs can be addressed in the next venue of care SLP Visit Diagnosis: Aphasia (R47.01)     Assistance Recommended at Discharge  PRN  Functional Status Assessment Patient has had a recent decline in their functional status and demonstrates the ability to make significant improvements in function in a reasonable and predictable amount of time.  Frequency and Duration           SLP Evaluation Cognition  Overall Cognitive Status: Impaired/Different from baseline Arousal/Alertness: Awake/alert  Orientation Level: Oriented X4 Attention: Selective;Sustained Sustained Attention: Appears intact Selective Attention: Appears intact Memory: Appears intact Awareness: Appears intact Problem Solving: Appears intact Safety/Judgment: Appears intact       Comprehension  Auditory Comprehension Overall Auditory  Comprehension: Impaired Yes/No Questions: Impaired Basic Biographical Questions: 76-100% accurate Complex Questions: Other (comment) (90%) Commands: Impaired One Step Basic Commands: 75-100% accurate Two Step Basic Commands: 50-74% accurate    Expression Expression Primary Mode of Expression: Verbal Verbal Expression Overall Verbal Expression: Impaired Initiation: No impairment Automatic Speech: Name;Social Response;Counting;Day of week Level of Generative/Spontaneous Verbalization: Phrase;Sentence Repetition: Impaired Level of Impairment: Phrase level Naming: Impairment Confrontation: Within functional limits Convergent: Not tested Divergent: 25-49% accurate Pragmatics: No impairment Effective Techniques: Open ended questions Non-Verbal Means of Communication: Not applicable Written Expression Written Expression: Not tested   Oral / Motor  Oral Motor/Sensory Function Overall Oral Motor/Sensory Function: Within functional limits Motor Speech Overall Motor Speech: Appears within functional limits for tasks assessed Respiration: Within functional limits Resonance: Within functional limits Articulation: Within functional limitis Intelligibility: Intelligible Motor Planning: Within functional limits Motor Speech Errors: Not applicable            Norleen IVAR Blase, MA, CCC-SLP Speech Therapy

## 2023-09-25 NOTE — Plan of Care (Signed)
  Problem: Ischemic Stroke/TIA Tissue Perfusion: Goal: Complications of ischemic stroke/TIA will be minimized Outcome: Progressing   Problem: Health Behavior/Discharge Planning: Goal: Goals will be collaboratively established with patient/family Outcome: Progressing   Problem: Self-Care: Goal: Ability to participate in self-care as condition permits will improve Outcome: Progressing Goal: Ability to communicate needs accurately will improve Outcome: Progressing   Problem: Nutrition: Goal: Risk of aspiration will decrease Outcome: Progressing Goal: Dietary intake will improve Outcome: Progressing   Problem: Clinical Measurements: Goal: Ability to maintain clinical measurements within normal limits will improve Outcome: Progressing   Problem: Activity: Goal: Risk for activity intolerance will decrease Outcome: Progressing   Problem: Nutrition: Goal: Adequate nutrition will be maintained Outcome: Progressing   Problem: Elimination: Goal: Will not experience complications related to urinary retention Outcome: Progressing   Problem: Pain Managment: Goal: General experience of comfort will improve and/or be controlled Outcome: Progressing

## 2023-09-25 NOTE — Progress Notes (Signed)
 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    PMH CVA, DVT on xarelto, multiple myeloma, HTN   OT comments  Pt progressing toward goals, continues to have wordfinding difficulties but improved this session. Pt completing letter cancellation task with 1 error on R side of page, also needs incr cues/time to sequence how to label time of 11:10 during clock draw assessment. Pt CGA for ADL, supervision for bed mobility, and CGA for transfers without AD. Pt will need assist with medications/IADL mgmt at d/c. Pt presenting with impairments listed below, will follow acutely. Continue to recommend OP OT at d/c.       If plan is discharge home, recommend the following:  A little help with walking and/or transfers;A little help with bathing/dressing/bathroom;Assistance with cooking/housework;Direct supervision/assist for medications management;Direct supervision/assist for financial management;Assist for transportation;Help with stairs or ramp for entrance;Supervision due to cognitive status   Equipment Recommendations  Tub/shower seat    Recommendations for Other Services PT consult    Precautions / Restrictions Precautions Precautions: Fall Precaution/Restrictions Comments: watch O2 Restrictions Weight Bearing Restrictions Per Provider Order: No       Mobility Bed Mobility Overal bed mobility: Needs Assistance Bed Mobility: Supine to Sit     Supine to sit: Supervision          Transfers Overall transfer level: Needs assistance Equipment used: None Transfers: Sit to/from Stand Sit to Stand: Contact guard assist                 Balance Overall balance assessment: Needs assistance Sitting-balance  support: Feet supported Sitting balance-Leahy Scale: Good     Standing balance support: During functional activity Standing balance-Leahy Scale: Fair                             ADL either performed or assessed with clinical judgement   ADL Overall ADL's : Needs assistance/impaired                         Toilet Transfer: Contact guard Engineer, agricultural and Hygiene: Contact guard assist       Functional mobility during ADLs: Contact guard assist      Extremity/Trunk Assessment Upper Extremity Assessment RUE Deficits / Details: decr coordinatoin with finger to nose RUE Coordination: decreased fine motor   Lower Extremity Assessment Lower Extremity Assessment: Defer to PT evaluation        Vision   Eye Alignment: Within Functional Limits Tracking/Visual Pursuits: Decreased smoothness of horizontal tracking Additional Comments: R inattn, needs ~8 min to complete letter cancellation task, wtih 1 error of omission on R side of page. Pt lables clock correctly, though numbers on R side are not agianst edge of circle. Pt needs incr cues, breakdown of instructions to identify time of 11:10   Perception Perception Perception: Not tested   Praxis Praxis Praxis: Not tested   Communication Communication Communication: Impaired Factors Affecting Communication: Difficulty expressing self   Cognition Arousal: Alert Behavior During Therapy: WFL for tasks assessed/performed Cognition: Difficult to assess Difficult to assess due to: Impaired communication           OT - Cognition Comments: continued  wordfinding difficulties, follows commands with incr time, some slowed processing                 Following commands: Impaired Following commands impaired: Follows one step commands with increased time      Cueing   Cueing Techniques: Verbal cues  Exercises      Shoulder Instructions        General Comments VSS on RA    Pertinent Vitals/ Pain       Pain Assessment Pain Assessment: No/denies pain  Home Living                                          Prior Functioning/Environment              Frequency  Min 2X/week        Progress Toward Goals  OT Goals(current goals can now be found in the care plan section)  Progress towards OT goals: Progressing toward goals  Acute Rehab OT Goals Patient Stated Goal: none stated OT Goal Formulation: With patient Time For Goal Achievement: 10/08/23 Potential to Achieve Goals: Good ADL Goals Pt Will Perform Upper Body Dressing: with supervision;sitting Pt Will Perform Lower Body Dressing: with supervision;sitting/lateral leans;sit to/from stand Pt Will Transfer to Toilet: with supervision;ambulating;regular height toilet Pt Will Perform Tub/Shower Transfer: Shower transfer;with supervision;ambulating Additional ADL Goal #1: pt will locate and attend to items on R side with min cues in prep for ADL  Plan      Co-evaluation                 AM-PAC OT 6 Clicks Daily Activity     Outcome Measure   Help from another person eating meals?: A Little Help from another person taking care of personal grooming?: A Little Help from another person toileting, which includes using toliet, bedpan, or urinal?: A Little Help from another person bathing (including washing, rinsing, drying)?: A Little Help from another person to put on and taking off regular upper body clothing?: A Little Help from another person to put on and taking off regular lower body clothing?: A Little 6 Click Score: 18    End of Session Equipment Utilized During Treatment: Gait belt  OT Visit Diagnosis: Unsteadiness on feet (R26.81);Other abnormalities of gait and mobility (R26.89);Muscle weakness (generalized) (M62.81);Other symptoms and signs involving cognitive function;Cognitive communication deficit (R41.841)   Activity  Tolerance Patient tolerated treatment well   Patient Left in chair;with call bell/phone within reach;with chair alarm set;with nursing/sitter in room   Nurse Communication Mobility status        Time: 9099-9067 OT Time Calculation (min): 32 min  Charges: OT General Charges $OT Visit: 1 Visit OT Treatments $Self Care/Home Management : 8-22 mins $Cognitive Funtion inital: Initial 15 mins  Craig Rivera, OTD, OTR/L [employer] Preferred Acute Rehab [phone]   Craig Rivera 09/25/2023, 12:10 PM

## 2023-09-25 NOTE — Discharge Summary (Addendum)
 Stroke Discharge Summary  Patient ID: Craig Rivera       MRN: [id number]      DOB: 09-16-1955  Date of Admission: 09/23/2023 Date of Discharge: 09/25/2023  Attending Physician:  Jerri Pfeiffer, MD Consultant(s):    None  Patient's PCP:  Shona Norleen PEDLAR, MD  DISCHARGE PRIMARY DIAGNOSIS:   Stroke: Left MCA infarcts with left MCA occlusion at the origin, etiology:  Cardioembolic source vs hypercoagulable state/chemo medications    Secondary diagnosis History of DVT Multiple myeloma on chemo Hypertension Hyperlipidemia Smoker Obesity   Allergies as of 09/25/2023       Reactions   Penicillins Diarrhea   Ceclor [cefaclor] Diarrhea, Rash   Revlimid [lenalidomide] Rash   Sudafed [pseudoephedrine Hcl] Rash        Medication List     STOP taking these medications    Pomalyst  3 MG capsule Generic drug: pomalidomide    Xarelto 10 MG Tabs tablet Generic drug: rivaroxaban       TAKE these medications    acetaminophen  500 MG tablet Commonly known as: TYLENOL  Take 1,000 mg by mouth 2 (two) times daily as needed for moderate pain (pain score 4-6) or headache.   amLODipine 5 MG tablet Commonly known as: NORVASC Take 5 mg by mouth at bedtime.   apixaban  5 MG Tabs tablet Commonly known as: ELIQUIS  Take 1 tablet (5 mg total) by mouth 2 (two) times daily.   atorvastatin  20 MG tablet Commonly known as: LIPITOR Take 1 tablet (20 mg total) by mouth daily. Start taking on: September 26, 2023   DAYQUIL PO Take 30 mLs by mouth daily.   dextromethorphan-guaiFENesin 30-600 MG 12hr tablet Commonly known as: MUCINEX DM Take 1 tablet by mouth every 12 (twelve) hours.   fentaNYL  75 MCG/HR Commonly known as: DURAGESIC  Place 1 patch onto the skin every 3 (three) days.   hydrALAZINE  25 MG tablet Commonly known as: APRESOLINE  Take 25 mg by mouth 2 (two) times daily.   levocetirizine 5 MG tablet Commonly known as: XYZAL Take 5 mg by mouth daily.   losartan-hydrochlorothiazide  50-12.5 MG tablet Commonly known as: HYZAAR Take 1 tablet by mouth daily.   NYQUIL PO Take 30 mLs by mouth at bedtime.   oxyCODONE  5 MG immediate release tablet Commonly known as: Oxy IR/ROXICODONE  Take 1 tablet (5 mg total) by mouth every 4 (four) hours as needed. pain What changed:  when to take this reasons to take this   pantoprazole  40 MG tablet Commonly known as: PROTONIX  Take 40 mg by mouth daily.   potassium chloride  SA 20 MEQ tablet Commonly known as: KLOR-CON  M Take 20 mEq by mouth at bedtime.   tamsulosin 0.4 MG Caps capsule Commonly known as: FLOMAX Take 0.4 mg by mouth at bedtime.   valACYclovir 500 MG tablet Commonly known as: VALTREX Take 500 mg by mouth 2 (two) times daily.   venlafaxine  XR 75 MG 24 hr capsule Commonly known as: EFFEXOR -XR Take 75 mg by mouth daily.        LABORATORY STUDIES CBC    Component Value Date/Time   WBC 4.4 09/25/2023 0640   RBC 3.78 (L) 09/25/2023 0640   HGB 12.8 (L) 09/25/2023 0640   HCT 36.6 (L) 09/25/2023 0640   PLT 127 (L) 09/25/2023 0640   MCV 96.8 09/25/2023 0640   MCH 33.9 09/25/2023 0640   MCHC 35.0 09/25/2023 0640   RDW 14.6 09/25/2023 0640   LYMPHSABS 1.2 09/24/2023 0614   MONOABS 0.3 09/24/2023 9385  EOSABS 0.0 09/24/2023 0614   BASOSABS 0.1 09/24/2023 0614   CMP    Component Value Date/Time   NA 136 09/25/2023 0640   K 3.4 (L) 09/25/2023 0640   CL 100 09/25/2023 0640   CO2 28 09/25/2023 0640   GLUCOSE 95 09/25/2023 0640   BUN 12 09/25/2023 0640   CREATININE 0.89 09/25/2023 0640   CALCIUM  8.7 (L) 09/25/2023 0640   PROT 6.5 09/23/2023 1742   ALBUMIN 3.6 09/23/2023 1742   AST 11 (L) 09/23/2023 1742   ALT 13 09/23/2023 1742   ALKPHOS 53 09/23/2023 1742   BILITOT 0.3 09/23/2023 1742   GFRNONAA >60 09/25/2023 0640   GFRAA >60 04/12/2017 1039   COAGS Lab Results  Component Value Date   INR 1.3 (H) 09/23/2023   Lipid Panel    Component Value Date/Time   CHOL 130 09/24/2023 0952   TRIG  118 09/24/2023 0952   HDL 25 (L) 09/24/2023 0952   CHOLHDL 5.2 09/24/2023 0952   VLDL 24 09/24/2023 0952   LDLCALC 81 09/24/2023 0952   HgbA1C  Lab Results  Component Value Date   HGBA1C 5.1 09/24/2023   Alcohol Level    Component Value Date/Time   [facility] <15 09/23/2023 1930     SIGNIFICANT DIAGNOSTIC STUDIES ECHOCARDIOGRAM COMPLETE Result Date: 09/24/2023    ECHOCARDIOGRAM REPORT   Patient Name:   Craig Rivera Date of Exam: 09/24/2023 Medical Rec #:  [id number]     Height:       70.0 in Accession #:    [id number]    Weight:       215.0 lb Date of Birth:  02/09/1955    BSA:          2.152 m Patient Age:    67 years      BP:           147/93 mmHg Patient Gender: M             HR:           45 bpm. Exam Location:  Inpatient Procedure: 2D Echo, Cardiac Doppler and Color Doppler (Both Spectral and Color            Flow Doppler were utilized during procedure). Indications:    Stroke I63.9  History:        Patient has no prior history of Echocardiogram examinations.                 Risk Factors:Hypertension.  Sonographer:    Tinnie Gosling RDCS Referring Phys: [id number] [facility] IMPRESSIONS  1. Left ventricular ejection fraction, by estimation, is 55%. The left ventricle has normal function. The left ventricle has no regional wall motion abnormalities. There is moderate concentric left ventricular hypertrophy. Left ventricular diastolic parameters are consistent with Grade I diastolic dysfunction (impaired relaxation).  2. Right ventricular systolic function is normal. The right ventricular size is normal. Tricuspid regurgitation signal is inadequate for assessing PA pressure.  3. Left atrial size was mildly dilated.  4. The mitral valve is normal in structure. No evidence of mitral valve regurgitation. No evidence of mitral stenosis.  5. The aortic valve is tricuspid. Aortic valve regurgitation is not visualized. No aortic stenosis is present.  6. Aortic dilatation noted. There is mild dilatation  of the ascending aorta, measuring 40 mm.  7. The inferior vena cava is dilated in size with >50% respiratory variability, suggesting right atrial pressure of 8 mmHg. FINDINGS  Left Ventricle: Left ventricular ejection fraction, by  estimation, is 55%. The left ventricle has normal function. The left ventricle has no regional wall motion abnormalities. The left ventricular internal cavity size was normal in size. There is moderate concentric left ventricular hypertrophy. Left ventricular diastolic parameters are consistent with Grade I diastolic dysfunction (impaired relaxation). Right Ventricle: The right ventricular size is normal. No increase in right ventricular wall thickness. Right ventricular systolic function is normal. Tricuspid regurgitation signal is inadequate for assessing PA pressure. Left Atrium: Left atrial size was mildly dilated. Right Atrium: Right atrial size was normal in size. Pericardium: There is no evidence of pericardial effusion. Mitral Valve: The mitral valve is normal in structure. Mild mitral annular calcification. No evidence of mitral valve regurgitation. No evidence of mitral valve stenosis. Tricuspid Valve: The tricuspid valve is normal in structure. Tricuspid valve regurgitation is not demonstrated. Aortic Valve: The aortic valve is tricuspid. Aortic valve regurgitation is not visualized. No aortic stenosis is present. Pulmonic Valve: The pulmonic valve was normal in structure. Pulmonic valve regurgitation is not visualized. Aorta: The aortic root is normal in size and structure and aortic dilatation noted. There is mild dilatation of the ascending aorta, measuring 40 mm. Venous: The inferior vena cava is dilated in size with greater than 50% respiratory variability, suggesting right atrial pressure of 8 mmHg. IAS/Shunts: No atrial level shunt detected by color flow Doppler.  LEFT VENTRICLE PLAX 2D LVIDd:         5.79 cm   Diastology LVIDs:         3.56 cm   LV e' medial:    6.20  cm/s LV PW:         1.64 cm   LV E/e' medial:  10.3 LV IVS:        1.60 cm   LV e' lateral:   5.87 cm/s LVOT diam:     2.09 cm   LV E/e' lateral: 10.9 LV SV:         102 LV SV Index:   47 LVOT Area:     3.43 cm  RIGHT VENTRICLE             IVC RV S prime:     12.00 cm/s  IVC diam: 2.37 cm TAPSE (M-mode): 2.3 cm LEFT ATRIUM             Index        RIGHT ATRIUM           Index LA diam:        4.62 cm 2.15 cm/m   RA Area:     17.50 cm LA Vol (A2C):   47.6 ml 22.11 ml/m  RA Volume:   47.80 ml  22.21 ml/m LA Vol (A4C):   75.7 ml 35.17 ml/m LA Biplane Vol: 65.8 ml 30.57 ml/m  AORTIC VALVE LVOT Vmax:   132.00 cm/s LVOT Vmean:  83.700 cm/s LVOT VTI:    0.297 m  AORTA Ao Root diam: 3.30 cm Ao Asc diam:  3.99 cm MITRAL VALVE MV Area (PHT): 2.29 cm    SHUNTS MV Decel Time: 332 msec    Systemic VTI:  0.30 m MV E velocity: 63.90 cm/s  Systemic Diam: 2.09 cm MV A velocity: 94.90 cm/s MV E/A ratio:  0.67 Dalton McleanMD Electronically signed by Ezra Kanner Signature Date/Time: 09/24/2023/11:30:43 AM    Final    MR ANGIO HEAD WO CONTRAST Result Date: 09/23/2023 EXAM: MR Angiography Head without intravenous Contrast. 09/23/2023 06:38:51 PM TECHNIQUE: Magnetic resonance angiography images of the  head without intravenous contrast. Multiplanar 2D and 3D reformatted images are provided for review. COMPARISON: None provided. CLINICAL HISTORY: Neuro deficit, acute, stroke suspected. Pt to the ED with complaints of trouble focusing, disorientation, headache, and trouble finding his words. Last known well 1015 today. Pt states he has an uncomfortable feeling in his chest. FINDINGS: ANTERIOR CIRCULATION: The left middle cerebral artery is occluded at its origin. There is moderate collateralization within the anterior division territory but poor collateralization in the posterior left MCA territory. POSTERIOR CIRCULATION: No significant stenosis of the posterior cerebral arteries. No significant stenosis of the basilar artery.  No significant stenosis of the vertebral arteries. No aneurysm. IMPRESSION: 1. Left middle cerebral artery occlusion at its origin. 2. Findings discussed with Dr. Zammit at 7:15pm 09/23/23. Electronically signed by: Franky Stanford MD 09/23/2023 07:16 PM EDT RP Workstation: [id number]   MR Angiogram Neck W or Wo Contrast Result Date: 09/23/2023 EXAM: MRA Neck without and with contrast 09/23/2023 06:38:51 PM TECHNIQUE: Multiplanar multisequence MRA of the neck was performed without and with the administration of 10mL intravenous contrast (gadobutrol  (GADAVIST ) 1 MMOL/ML injection 10 mL GADOBUTROL  1 MMOL/ML IV SOLN). 2D and 3D reformatted images are provided for review. Stenosis of the internal carotid arteries is measured using NASCET criteria. COMPARISON: None available CLINICAL HISTORY: Stroke/TIA, determine embolic source. Pt to the ED with complaints of trouble focusing, disorientation, headache, and trouble finding his words. Last known well 1015 today. Pt states he has an uncomfortable feeling in his chest. FINDINGS: CAROTID ARTERIES: No dissection. No hemodynamically significant stenosis by NASCET criteria. VERTEBRAL ARTERIES: No dissection. No significant stenosis. IMPRESSION: 1. No hemodynamically significant stenosis or dissection of the neck arteries. Electronically signed by: Franky Stanford MD 09/23/2023 07:10 PM EDT RP Workstation: [id number]   MR BRAIN W WO CONTRAST Result Date: 09/23/2023 EXAM: MRI BRAIN WITH AND WITHOUT CONTRAST 09/23/2023 06:38:51 PM TECHNIQUE: Multiplanar multisequence MRI of the head/brain was performed with and without the administration of intravenous contrast. COMPARISON: None available. CLINICAL HISTORY: Mental status change, unknown cause. Pt to the ED with complaints of trouble focusing, disorientation, headache, and trouble finding his words. Last known well 1015 today. Pt states he has an uncomfortable feeling in his chest. FINDINGS: BRAIN AND VENTRICLES: Multifocal acute  ischemia within the left temporal and parietal lobes. This is within the posterior left MCA territory and/or the left MCA/PCA watershed zone. Multifocal hyperintense T2-weighted signal within the cerebral white matter, most commonly due to chronic small vessel disease. No acute intracranial hemorrhage. No mass effect or midline shift. No hydrocephalus. The sella is unremarkable. Normal flow voids. No mass or abnormal enhancement. ORBITS: No acute abnormality. SINUSES: No acute abnormality. BONES AND SOFT TISSUES: Normal bone marrow signal and enhancement. No acute soft tissue abnormality. IMPRESSION: 1. Multifocal acute ischemia within the left temporal and parietal lobes. 2. Multifocal hyperintense T2-weighted signal within the cerebral white matter, most commonly due to chronic small vessel disease. Electronically signed by: Franky Stanford MD 09/23/2023 07:04 PM EDT RP Workstation: [id number]       HISTORY OF PRESENT ILLNESS  Craig Rivera is a 68 y.o. male with hx of DVT on Xarelto, GERD, hypertension, hyperlipidemia, multiple myeloma on opioids who presents with mild aphasia.   Patient reports that he got up this morning and was at his baseline.  He went to Bible study around 10 AM in the morning.  He noted that he had significant difficulty reading.  There were nurses in the study group and they noted that  he was having difficulty with his speech and requested that he come to the ED.  At 4 PM in the afternoon, at his wife's request, patient finally agreed to be evaluated at [facility] ED.  Wife drove him over to the ED.   Patient had MRI of the brain along with MRA of the head and neck which demonstrated left MCA occlusion at the origin along with left temporoparietal strokes in the MCA/PCA watershed distribution.   A code stroke was activated in the ED and patient was evaluated by teleneurology.  He was outside the window for TNKase at the time of arrival to the ED.  Given his symptoms are  too mild, he was not deemed to be a candidate for thrombectomy.  However, he was monitored to [facility] for close monitoring and neurochecks.  With plan to consider thrombectomy in case if his symptoms worsen.   Last known well: 10 AM on 09/23/2023 Modified rankin score: 0-Completely asymptomatic and back to baseline post- stroke IV Thrombolysis: Not offered, outside the window.   Thrombectomy: Not at this time.  However being closely monitored and if his symptoms were to become disabling, he would then be a candidate for thrombectomy.  I had reviewed his images and discuss his case with Dr. Maude Pour with neurointerventional radiology to make him aware.  I have also discussed with patient and his wife.  I have concerns that if patient was to worsen, he would lose his ability to understand language and therefore unable to consent.  Patient is agreeable to thrombectomy in case if he declines.  He understands the details of thrombectomy, the risks of hemorrhage as well as potential benefit.  NIH on Admission: 1  HOSPITAL COURSE  Stroke:  left MCA infarcts with left MCA occlusion at the origin, etiology:  Cardioembolic source vs hypercoagulable state/chemo medications MRI multifocal acute ischemia within the left temporal and parietal lobes.  chronic small vessel disease. MRA head left middle cerebral artery occlusion at its origin. MRA neck unremarkable 2D Echo LVEF 55%  LDL 81 HgbA1c 5.1 UDS negative VTE prophylaxis - SCDs Xarelto (rivaroxaban) daily prior to admission, now on Eliquis  5 mg twice daily Therapy recommendations: outpt OT, outpatient SLP Disposition: 8/19   Hx of DVT On daily Xarelto 10mg  maintenance dose PTA Discontinue Xarelto 10 mg Started on bid Eliquis  5 mg   Multiple Myeloma on chemo Diagnosed in 2015, treated with chemotherapy and stem cell transplant Relapse in 2023, on Pomalyst , daratumumab , IVIG  Discussed with Dr. Lonn, given potential  thromboembolic effect of pomalidomide , will hold off chemo at this time, and follow up with his oncologist at [facility] ASAP after discharge   Hypertension Home meds: Amlodipine 5 mg, losartan-hydrochlorothiazide 50-12.5 mg Stable now Avoid low BP given left MCA occlusion Long-term BP goal normotensive   Hyperlipidemia Home meds: None LDL 87, goal < 70 On Lipitor 20 High intensity statin not indicated as no significant atherosclerosis on vessel imaging Continue statin on discharge   Tobacco Abuse Patient smokes 1/2 packs per day for 40 years Nicotine replacement therapy provided Patient is willing to quit   Other Stroke Risk Factors Obesity, Body mass index is 30.85 kg/m., BMI >/= 30 associated with increased stroke risk, recommend weight loss, diet and exercise as appropriate    Other Active Problems GERD  DISCHARGE EXAM  PHYSICAL EXAM General:  Alert, well-nourished, well-developed patient in no acute distress CV: Regular rhythm and bradycardic on monitor Respiratory:  Regular, unlabored respirations  on room air     NEURO:  Mental Status: AA&Ox3, patient is able to give clear and coherent history Speech/Language: Slightly hesitant speech and takes pauses to find some word. Overall speech fluency is still preserved with mild paraphasic errors. Naming, repetition, fluency, and comprehension intact.   Cranial Nerves:  II: PERRL. Visual fields full.  III, IV, VI: EOMI. Eyelids elevate symmetrically.  V: Sensation is intact to light touch and symmetrical to face.  VII: Slight R facial droop  VIII: hearing intact to voice. IX, X: Palate elevates symmetrically. Phonation is normal.  KP:Dynloizm shrug 5/5. XII: tongue is midline without fasciculations. Motor: 5/5 strength to all muscle groups tested.  Tone: is normal and bulk is normal Sensation- Intact to light touch bilaterally. Extinction absent to light touch to DSS.   Coordination: FTN intact bilaterally, HKS: no ataxia in  BLE.No drift.  Gait- deferred  Dizziness Present: No (08/19 0800) Headache Present: No (08/19 0800) Interval: Shift assessment (08/19 0800) Level of Consciousness (1a.)   : Alert, keenly responsive (08/19 0800) LOC Questions (1b. )   : Answers both questions correctly (08/19 0800) LOC Commands (1c. )   : Performs both tasks correctly (08/19 0800) Best Gaze (2. )  : Normal (08/19 0800) Visual (3. )  : No visual loss (08/19 0800) Facial Palsy (4. )    : Minor paralysis (08/19 0800) Motor Arm, Left (5a. )   : No drift (08/19 0800) Motor Arm, Right (5b. ) : No drift (08/19 0800) Motor Leg, Left (6a. )  : No drift (08/19 0800) Motor Leg, Right (6b. ) : No drift (08/19 0800) Limb Ataxia (7. ): Absent (08/19 0800) Sensory (8. )  : Normal, no sensory loss (08/19 0800) Best Language (9. )  : Mild-to-moderate aphasia (08/19 0800) Dysarthria (10. ): Normal (08/19 0800) Extinction/Inattention (11.)   : No Abnormality (08/19 0800) Complete NIHSS TOTAL: 2 (08/19 0800)   Discharge Diet       Diet   Diet regular Room service appropriate? Yes with Assist; Fluid consistency: Thin   liquids  DISCHARGE PLAN Disposition: Home Eliquis  (apixaban ) daily for secondary stroke prevention alone, discontinue Xarelto 10 mg Monitor blood pressure at home. Take prescribed blood pressure medications if systolic blood pressure is consistently greater than 150 mmHg. If systolic blood pressure is persistently above 180 mmHg, or if you experience chest pain, shortness of breath, severe headache, vision changes, weakness, or difficulty speaking, seek medical attention immediately. Follow up with PCP for ongoing blood pressure management and adjustment of medications. Ongoing stroke risk factor control by Primary Care Physician at time of discharge Follow-up PCP Shona Norleen PEDLAR, MD in 2 weeks.  Follow up with Dannielle Alm Locus MD ASAP about discontinuation of pomalidomide  3 MG, will need to start new chemotherapy that  does not increase risk of future stroke Follow-up in [name] in 4-6 weeks, office to schedule an appointment. Able to see NP in clinic.  35 minutes were spent preparing discharge.  Alan Maiden, MD PGY-1  ATTENDING NOTE: I reviewed above note and agree with the assessment and plan. Pt was seen and examined.   No family at bedside.  Patient sitting in chair, awake alert, still has mild expressive aphasia with subtle right facial droop.  Otherwise medically stable for discharge.  Continue Eliquis  twice daily, and continue statin, hold off pomalidomide  and recommend follow-up with [facility] oncologist ASAP.  Follow-up with [facility] in 4 to 6 weeks.  For detailed  assessment and plan, please refer to above as I have made changes wherever appropriate.   Ary Cummins, MD PhD Stroke Neurology 09/25/2023 5:09 PM

## 2023-09-25 NOTE — Progress Notes (Signed)
 Pt discharged home via private vehicle. Vital signs and exam all WDL. PIV discontinued. All belongings returned to patient. [name] (Wife) present for discharge instructions. Wife and patient verbalized understanding for discharge nurse Diann RAMAN.  09/25/2023 Charletta Bathe, RN, BSN 1:00 PM

## 2023-09-25 NOTE — Discharge Instructions (Addendum)
 Information on my medicine - ELIQUIS  (apixaban )  Stop taking Xarelto (rivaroxaban) and start taking Eliquis  (apixaban )  Why was Eliquis  prescribed for you? Eliquis  was prescribed to treat blood clots that may have been found in the veins of your legs (deep vein thrombosis) or in your lungs (pulmonary embolism) and to reduce the risk of them occurring again.  What do You need to know about Eliquis  ? The dose is ONE 5 mg tablet taken TWICE daily.  Eliquis  may be taken with or without food.   Try to take the dose about the same time in the morning and in the evening. If you have difficulty swallowing the tablet whole please discuss with your pharmacist how to take the medication safely.  Take Eliquis  exactly as prescribed and DO NOT stop taking Eliquis  without talking to the doctor who prescribed the medication.  Stopping may increase your risk of developing a new blood clot.  Refill your prescription before you run out.  After discharge, you should have regular check-up appointments with your healthcare provider that is prescribing your Eliquis .    What do you do if you miss a dose? If a dose of ELIQUIS  is not taken at the scheduled time, take it as soon as possible on the same day and twice-daily administration should be resumed. The dose should not be doubled to make up for a missed dose.  Important Safety Information A possible side effect of Eliquis  is bleeding. You should call your healthcare provider right away if you experience any of the following: Bleeding from an injury or your nose that does not stop. Unusual colored urine (red or dark brown) or unusual colored stools (red or black). Unusual bruising for unknown reasons. A serious fall or if you hit your head (even if there is no bleeding).  Some medicines may interact with Eliquis  and might increase your risk of bleeding or clotting while on Eliquis . To help avoid this, consult your healthcare provider or pharmacist  prior to using any new prescription or non-prescription medications, including herbals, vitamins, non-steroidal anti-inflammatory drugs (NSAIDs) and supplements.  This website has more information on Eliquis  (apixaban ): [email or web address] .[email or web address] dena

## 2023-09-26 ENCOUNTER — Telehealth: Payer: Self-pay

## 2023-09-26 NOTE — Patient Instructions (Signed)
 Visit Information  Thank you for taking time to visit with me today. Please don't hesitate to contact me if I can be of assistance to you.  Stroke Symptoms Sudden numbness or weakness in the face, arm, or leg, especially on one side of the body. Sudden confusion, trouble speaking, or difficulty understanding speech. Sudden trouble seeing in one or both eyes. Sudden trouble walking, dizziness, loss of balance, or lack of coordination. Sudden severe headache with no known cause. Call 9-1-1 right away if you or someone else has any of these symptoms.  Patient verbalizes understanding of instructions and care plan provided today and agrees to view in MyChart. Active MyChart status and patient understanding of how to access instructions and care plan via MyChart confirmed with patient.     The patient has been provided with contact information for the care management team and has been advised to call with any health related questions or concerns.   Please call the care guide team at [phone] if you need to cancel or reschedule your appointment.   Please call the Suicide and Crisis Lifeline: 988 if you are experiencing a Mental Health or Behavioral Health Crisis or need someone to talk to.  Cassiopeia Florentino J. Kary Sugrue RN, MSN [facility], [facility] RN Care Manager Direct Dial: [phone]  Fax: [phone] Website: [email or web address]

## 2023-09-26 NOTE — Transitions of Care (Post Inpatient/ED Visit) (Signed)
 09/26/2023  Name: Craig Rivera MRN: [id number] DOB: 1955-02-07  Today's TOC FU Call Status: Today's TOC FU Call Status:: Successful TOC FU Call Completed TOC FU Call Complete Date: 09/26/23 Patient's Name and Date of Birth confirmed.  Transition Care Management Follow-up Telephone Call Date of Discharge: 09/18/23 Discharge Facility: [facility]) Type of Discharge: Inpatient Admission Primary Inpatient Discharge Diagnosis:: Stroke How have you been since you were released from the hospital?: Better Any questions or concerns?: No  Items Reviewed: Did you receive and understand the discharge instructions provided?: Yes Medications obtained,verified, and reconciled?: Yes (Medications Reviewed) Any new allergies since your discharge?: No Dietary orders reviewed?: Yes Type of Diet Ordered:: Low sodium heart healthy Do you have support at home?: Yes People in Home [RPT]: spouse Name of Support/Comfort Primary Source: [name]  Medications Reviewed Today: Medications Reviewed Today     Reviewed by Briah Nary, RN (Case Manager) on 09/26/23 at 1137  Med List Status: <None>   Medication Order Taking? Sig Documenting Provider Last Dose Status Informant  acetaminophen  (TYLENOL ) 500 MG tablet [id number] Yes Take 1,000 mg by mouth 2 (two) times daily as needed for moderate pain (pain score 4-6) or headache. [provider]  Active Self, Pharmacy Records  amLODipine (NORVASC) 5 MG tablet [id number] Yes Take 5 mg by mouth at bedtime. [provider]  Active Self, Pharmacy Records  apixaban  (ELIQUIS ) 5 MG TABS tablet [id number] Yes Take 1 tablet (5 mg total) by mouth 2 (two) times daily. Elodie Palma, MD  Active   atorvastatin  (LIPITOR) 20 MG tablet [id number] Yes Take 1 tablet (20 mg total) by mouth daily. Elodie Palma, MD  Active   dextromethorphan-guaiFENesin [facility] DM) 30-600 MG 12hr tablet [id number] Yes Take 1 tablet by mouth every 12 (twelve) hours. [provider]  Active Self, Pharmacy Records  fentaNYL  (DURAGESIC ) 75 MCG/HR [id number] Yes Place 1 patch onto the skin every 3 (three) days. [provider]  Active Self, Pharmacy Records  hydrALAZINE  (APRESOLINE ) 25 MG tablet [id number] Yes Take 25 mg by mouth 2 (two) times daily. [provider]  Active Self, Pharmacy Records           Med Note (COFFELL, JON HERO   Mon Sep 24, 2023 10:54 AM) LF confirmed sold 07/03/23 #180, 90 DS.  levocetirizine (XYZAL) 5 MG tablet [id number] Yes Take 5 mg by mouth daily. [provider]  Active Self, Pharmacy Records           Med Note (COFFELL, JON HERO Kitchens Sep 24, 2023 10:53 AM) LF confirmed sold 09/07/23 #30, 30 DS.  losartan-hydrochlorothiazide (HYZAAR) 50-12.5 MG tablet [id number] Yes Take 1 tablet by mouth daily. [provider]  Active Self, Pharmacy Records           Med Note (COFFELL, JON HERO Kitchens Sep 24, 2023 10:53 AM) Patient confirmed he is currently using the Rx bottle in personal belongings (50-12.5mg ). Per dispense report patient is regularly filling 2 strengths of losartan-HCTZ : 100-12.5mg  (confirmed sold 09/07/23 #30, 30 DS), 50-12.5mg  (confirmed sold 08/28/23 #100, 100 DS). Neither patient or spouse realized they had been picking up 2 different strengths.  oxyCODONE  (OXY IR/ROXICODONE ) 5 MG immediate release tablet [id number] Yes Take 1 tablet (5 mg total) by mouth every 4 (four) hours as needed. pain  Patient taking differently: Take 5 mg by mouth every 6 (six) hours as needed for severe pain (pain score 7-10). pain   Letha Truman ORN, NP  Active  Self, Pharmacy Records           Med Note (COFFELL, JON HERO   Mon Sep 24, 2023 10:49 AM) LF 03/17/22 #100, 25 DS. Very rarely used.  pantoprazole  (PROTONIX ) 40 MG tablet [id number] Yes Take 40 mg by mouth daily. [provider]  Active Self, Pharmacy Records  potassium chloride  SA (KLOR-CON  M) 20 MEQ tablet [id number] Yes Take 20 mEq by mouth at bedtime.  [provider]  Active Self, Pharmacy Records           Med Note (COFFELL, JON HERO Kitchens Sep 24, 2023 10:48 AM) LF confirmed sold 07/28/23 #90, 90 DS.  Pseudoeph-Doxylamine-DM-APAP (NYQUIL PO) [id number] Yes Take 30 mLs by mouth at bedtime. [provider]  Active Self, Pharmacy Records  Pseudoephedrine-APAP-DM (DAYQUIL PO) [id number] Yes Take 30 mLs by mouth daily. [provider]  Active Self, Pharmacy Records  tamsulosin (FLOMAX) 0.4 MG CAPS capsule [id number] Yes Take 0.4 mg by mouth at bedtime. [provider]  Active Self, Pharmacy Records  valACYclovir (VALTREX) 500 MG tablet [id number] Yes Take 500 mg by mouth 2 (two) times daily. [provider]  Active Self, Pharmacy Records  venlafaxine  XR (EFFEXOR -XR) 75 MG 24 hr capsule [id number] Yes Take 75 mg by mouth daily. [provider]  Active Self, Pharmacy Records           Med Note (COFFELL, JON HERO Kitchens Sep 24, 2023 10:47 AM) LF confirmed sold 09/07/23 #30, 30 DS.            Home Care and Equipment/Supplies: Were Home Health Services Ordered?: No Any new equipment or medical supplies ordered?: No  Functional Questionnaire: Do you need assistance with bathing/showering or dressing?: No Do you need assistance with meal preparation?: No Do you need assistance with eating?: No Do you have difficulty maintaining continence: No Do you need assistance with getting out of bed/getting out of a chair/moving?: No Do you have difficulty managing or taking your medications?: No  Follow up appointments reviewed: PCP Follow-up appointment confirmed?: No (Patient to call PCP) MD Provider Line Number:[phone] Given: No Specialist Hospital Follow-up appointment confirmed?: Yes Date of Specialist follow-up appointment?: 10/02/23 Follow-Up Specialty Provider:: [facility] Do you need transportation to your follow-up appointment?: No Do you understand care options if your condition(s)  worsen?: Yes-patient verbalized understanding  SDOH Interventions Today    Flowsheet Row Most Recent Value  SDOH Interventions   Food Insecurity Interventions Intervention Not Indicated  Housing Interventions Intervention Not Indicated  Transportation Interventions Intervention Not Indicated  Utilities Interventions Intervention Not Indicated    Abria Vannostrand J. Kaisley Stiverson RN, MSN [facility]  [facility], [facility] RN Care Manager Direct Dial: [phone]  Fax: [phone] Website: [email or web address]

## 2023-09-27 ENCOUNTER — Other Ambulatory Visit: Payer: Self-pay

## 2023-09-27 ENCOUNTER — Encounter (HOSPITAL_COMMUNITY): Payer: Self-pay | Admitting: Speech Pathology

## 2023-09-27 ENCOUNTER — Ambulatory Visit (HOSPITAL_COMMUNITY): Attending: Neurology | Admitting: Speech Pathology

## 2023-09-27 DIAGNOSIS — R4701 Aphasia: Secondary | ICD-10-CM | POA: Diagnosis not present

## 2023-09-27 DIAGNOSIS — R29818 Other symptoms and signs involving the nervous system: Secondary | ICD-10-CM | POA: Insufficient documentation

## 2023-09-27 DIAGNOSIS — I639 Cerebral infarction, unspecified: Secondary | ICD-10-CM | POA: Diagnosis not present

## 2023-09-27 DIAGNOSIS — R41841 Cognitive communication deficit: Secondary | ICD-10-CM | POA: Insufficient documentation

## 2023-09-27 NOTE — Therapy (Signed)
 OUTPATIENT SPEECH LANGUAGE PATHOLOGY EVALUATION   Patient Name: Craig Rivera MRN: [id number] DOB:[birth year], 68 y.o., male Today's Date: 09/27/2023  PCP: Shona Norleen PEDLAR, MD REFERRING PROVIDER: Jerri Pfeiffer, MD  END OF SESSION:  End of Session - 09/27/23 1141     Visit Number 1    Number of Visits 9    Date for SLP Re-Evaluation 11/01/23    Authorization Type [employer]   eff:02/07/23 oop: copay 5 auth no sw jacquline ref # [id number]   SLP Start Time 1107    SLP Stop Time  1200    SLP Time Calculation (min) 53 min    Activity Tolerance Patient tolerated treatment well          Past Medical History:  Diagnosis Date   Back pain    DVT (deep venous thrombosis) (HCC)    GERD (gastroesophageal reflux disease)    HTN (hypertension)    Hypercholesterolemia    Multiple myeloma (HCC)    Sciatic nerve pain    Past Surgical History:  Procedure Laterality Date   BONE MARROW TRANSPLANT     CATARACT EXTRACTION W/PHACO Right 08/11/2014   Procedure: CATARACT EXTRACTION PHACO AND INTRAOCULAR LENS PLACEMENT (IOC);  Surgeon: Dow JULIANNA Burke, MD;  Location: [facility];  Service: Ophthalmology;  Laterality: Right;  CDE 4.88   CATARACT EXTRACTION W/PHACO Left 10/26/2014   Procedure: CATARACT EXTRACTION PHACO AND INTRAOCULAR LENS PLACEMENT LEFT EYE CDE=8.64;  Surgeon: Dow JULIANNA Burke, MD;  Location: [facility];  Service: Ophthalmology;  Laterality: Left;   COLONOSCOPY  09/12/2010   Procedure: COLONOSCOPY;  Surgeon: Lamar CHRISTELLA Hollingshead, MD;  Location: [facility];  Service: Endoscopy;  Laterality: N/A;   COLONOSCOPY WITH PROPOFOL  N/A 01/06/2022   Procedure: COLONOSCOPY WITH PROPOFOL ;  Surgeon: Cindie Carlin POUR, DO;  Location: [facility];  Service: Endoscopy;  Laterality: N/A;  10:00am   inguinal hernia repair bilateral     as child    KNEE SURGERY Right    arthroscopy   POLYPECTOMY  01/06/2022   Procedure: POLYPECTOMY;  Surgeon: Cindie Carlin POUR, DO;  Location: [facility];  Service:  Endoscopy;;   TONSILLECTOMY     Patient Active Problem List   Diagnosis Date Noted   Acute ischemic left MCA stroke (HCC) 09/23/2023   Positive colorectal cancer screening using Cologuard test 12/09/2021   Hypokalemia 11/09/2015   Multiple myeloma (HCC) 07/22/2015   Encounter for screening colonoscopy 08/30/2010    ONSET DATE: 09/23/2023   REFERRING DIAG: I63.9 (ICD-10-CM) - Stroke, acute, thrombotic (HCC)  THERAPY DIAG:  Aphasia  Cognitive communication deficit  Rationale for Evaluation and Treatment: Rehabilitation  SUBJECTIVE:   SUBJECTIVE STATEMENT: It has gotten better.  Pt accompanied by: self and significant other  PERTINENT HISTORY: Patient is a 68 y.o. male with PMH: HTN, HLD, multiple myeloma on opioids who presented to the hospital on 09/23/23 with mild apasia. His wife drove him to the ED. MRI of brain showed Multifocal acute ischemia within the left temporal and parietal lobes. Pt was hospitalized 8/17-8/19/25 and discharged home with his wife. He was referred for outpatient SLP therapy by Dr. Pfeiffer Jerri.  PAIN:  Are you having pain? No  FALLS: Has patient fallen in last 6 months?  No  LIVING ENVIRONMENT: Lives with: lives with their spouse Lives in: House/apartment  PLOF:  Level of assistance: Independent with ADLs, Independent with IADLs Employment: Retired  PATIENT GOALS: Improve speech  OBJECTIVE:  Note: Objective measures were completed at Evaluation unless  otherwise noted.  DIAGNOSTIC FINDINGS:   MRI BRAIN WITH AND WITHOUT CONTRASTIMPRESSION: 1. Multifocal acute ischemia within the left temporal and parietal lobes. 2. Multifocal hyperintense T2-weighted signal within the cerebral white matter, most commonly due to chronic small vessel disease.   Electronically signed by: Franky Stanford MD 09/23/2023  COGNITION: Overall cognitive status: Within functional limits for tasks assessed Areas of impairment:  Memory: Impaired: Working Psychologist, educational  function: Impaired: Organization Functional deficits: Wife assists as needed with finances and medication management  AUDITORY COMPREHENSION: Overall auditory comprehension: Impaired: moderately complex YES/NO questions: Appears intact Following directions: Impaired: moderately complex Conversation: Complex Interfering components: processing speed Effective technique: extra processing time, repetition/stressing words, stressing words, and visual/gestural cues  READING COMPREHENSION: Intact  EXPRESSION: verbal  VERBAL EXPRESSION: Level of generative/spontaneous verbalization: sentence Automatic speech: name: intact, social response: intact, and month of year: intact  Repetition: Appears intact Naming: Responsive: 76-100%, Confrontation: 76-100%, and Divergent: 76-100% Pragmatics: Appears intact Comments: dysnomia Interfering components: N/A Effective technique: semantic cues, sentence completion, and phonemic cues Non-verbal means of communication: N/A  WRITTEN EXPRESSION: Dominant hand: right Written expression: Appears intact  MOTOR SPEECH: Overall motor speech: Appears intact Level of impairment: N/A Respiration: WNL Phonation: normal Resonance: WFL Articulation: Appears intact Intelligibility: Intelligible Motor planning: Appears intact Motor speech errors: N/A  ORAL MOTOR EXAMINATION: Overall status: WFL  STANDARDIZED ASSESSMENTS: SLUMS: 22/30                                                                                                                   TREATMENT DATE: 09/27/23 Pt was accompanied by his wife for the evaluation. SLP provided communication support template to be filled out at home with assist from family and reviewed wording finding strategies.  PATIENT EDUCATION: Education details: Pt will completed HEP as assigned to facilitate carryover of treatment strategies and techniques in home and community environment with written cues. Person  educated: Patient and Spouse Education method: Explanation, Demonstration, and Handouts Education comprehension: verbalized understanding   GOALS: Goals reviewed with patient? Yes  SHORT TERM GOALS: Target date: 11/01/2023  Pt will implement word-finding strategies with 90% accuracy when unable to verbalize desired word in conversation/functional tasks with min assist. Baseline: 70% Goal status: INITIAL  2.  Pt will describe objects and pictures by providing at least three salient features as judged by clinician with 90% acc when provided min cues.  Baseline: 75% Goal status: INITIAL  3.  Pt will complete moderately complex abstract divergent naming tasks with 90% acc with min cues.  Baseline: mod cues Goal status: INITIAL  4.  Pt will complete high level verbal expression tasks (object description, state function, provide short summary, etc) using short sentences with 90% acc and min assist. Baseline: 70% Goal status: INITIAL  5.  Pt will respond to open-ended questions using a complete sentence with 80% acc and min cues. Baseline:  Goal status: INITIAL  6.  Pt will follow multistep auditory directions with 90% acc when provided min cues. Baseline: mod cues and  repetition required Goal status: INITIAL  LONG TERM GOALS: Same as short term goals   ASSESSMENT:  CLINICAL IMPRESSION: (initial evaluation 09/27/23) Patient is a 68 y.o. male who was seen today for a cognitive linguistic evaluation. Pt presents with mild/mod expressive language deficits and mild receptive language deficits which negatively impacts executive functioning. Pt primarily presents with dysnomia and reduced length of utterances for answering open ended questions. He had difficulty listing hobbies, restaurants he frequents, and other important names. He was able to follow 2-step directions, but breakdowns occurred with lengthier/complex auditory directions. 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 He is motivated to improve his language skills and has excellent family support.   OBJECTIVE IMPAIRMENTS: include expressive language, receptive language, and aphasia. These impairments are limiting patient from effectively communicating at home and in community. Factors affecting potential to achieve goals and functional outcome are previous level of function and family/community support. Patient will benefit from skilled SLP services to address above impairments and improve overall function.  REHAB POTENTIAL: Excellent  PLAN:  SLP FREQUENCY: 2x/week  SLP DURATION: 4 weeks  PLANNED INTERVENTIONS: Cueing hierachy, Internal/external aids, Functional tasks, Multimodal communication approach, SLP instruction and feedback, Compensatory strategies, Patient/family education, [phone] Treatment of speech (30 or 45 min) , and [phone]- Speech [address] Artic, Phon, Eval Compre, Express   Thank you,  Lamar Candy, CCC-SLP [phone]  Amiya Escamilla, CCC-SLP 09/27/2023, 11:49 AM

## 2023-10-01 ENCOUNTER — Ambulatory Visit (HOSPITAL_COMMUNITY): Admitting: Speech Pathology

## 2023-10-01 ENCOUNTER — Encounter (HOSPITAL_COMMUNITY): Payer: Self-pay | Admitting: Speech Pathology

## 2023-10-01 DIAGNOSIS — I639 Cerebral infarction, unspecified: Secondary | ICD-10-CM | POA: Diagnosis not present

## 2023-10-01 DIAGNOSIS — R4701 Aphasia: Secondary | ICD-10-CM

## 2023-10-01 DIAGNOSIS — R41841 Cognitive communication deficit: Secondary | ICD-10-CM

## 2023-10-01 DIAGNOSIS — I63512 Cerebral infarction due to unspecified occlusion or stenosis of left middle cerebral artery: Secondary | ICD-10-CM | POA: Diagnosis not present

## 2023-10-01 NOTE — Therapy (Signed)
 OUTPATIENT SPEECH LANGUAGE PATHOLOGY TREATMENT   Patient Name: Craig Rivera MRN: [id number] DOB:1955-03-16, 68 y.o., male Today's Date: 10/01/2023  PCP: Shona Norleen PEDLAR, MD REFERRING PROVIDER: Jerri Pfeiffer, MD  END OF SESSION:  End of Session - 10/01/23 0904     Visit Number 2    Number of Visits 9    Date for SLP Re-Evaluation 11/06/23    Authorization Type [employer]   eff:02/07/23 oop: copay 5 auth no sw jacquline ref # [id number]   SLP Start Time [phone]    SLP Stop Time  0935    SLP Time Calculation (min) 46 min    Activity Tolerance Patient tolerated treatment well          Past Medical History:  Diagnosis Date   Back pain    DVT (deep venous thrombosis) (HCC)    GERD (gastroesophageal reflux disease)    HTN (hypertension)    Hypercholesterolemia    Multiple myeloma (HCC)    Sciatic nerve pain    Past Surgical History:  Procedure Laterality Date   BONE MARROW TRANSPLANT     CATARACT EXTRACTION W/PHACO Right 08/11/2014   Procedure: CATARACT EXTRACTION PHACO AND INTRAOCULAR LENS PLACEMENT (IOC);  Surgeon: Dow JULIANNA Burke, MD;  Location: [facility];  Service: Ophthalmology;  Laterality: Right;  CDE 4.88   CATARACT EXTRACTION W/PHACO Left 10/26/2014   Procedure: CATARACT EXTRACTION PHACO AND INTRAOCULAR LENS PLACEMENT LEFT EYE CDE=8.64;  Surgeon: Dow JULIANNA Burke, MD;  Location: [facility];  Service: Ophthalmology;  Laterality: Left;   COLONOSCOPY  09/12/2010   Procedure: COLONOSCOPY;  Surgeon: Lamar CHRISTELLA Hollingshead, MD;  [facility]: [facility];  Service: Endoscopy;  Laterality: N/A;   COLONOSCOPY WITH PROPOFOL  N/A 01/06/2022   Procedure: COLONOSCOPY WITH PROPOFOL ;  Surgeon: Cindie Carlin POUR, DO;  [facility]: [facility];  Service: Endoscopy;  Laterality: N/A;  10:00am   inguinal hernia repair bilateral     as child    KNEE SURGERY Right    arthroscopy   POLYPECTOMY  01/06/2022   Procedure: POLYPECTOMY;  Surgeon: Cindie Carlin POUR, DO;  [facility]: [facility];  Service:  Endoscopy;;   TONSILLECTOMY     Patient Active Problem List   Diagnosis Date Noted   Acute ischemic left MCA stroke (HCC) 09/23/2023   Positive colorectal cancer screening using Cologuard test 12/09/2021   Hypokalemia 11/09/2015   Multiple myeloma (HCC) 07/22/2015   Encounter for screening colonoscopy 08/30/2010    ONSET DATE: 09/23/2023   REFERRING DIAG: I63.9 (ICD-10-CM) - Stroke, acute, thrombotic (HCC)  THERAPY DIAG:  Aphasia  Cognitive communication deficit  Rationale for Evaluation and Treatment: Rehabilitation  SUBJECTIVE:   SUBJECTIVE STATEMENT: It has gotten better.  Pt accompanied by: self and significant other  PERTINENT HISTORY: Patient is a 68 y.o. male with PMH: HTN, HLD, multiple myeloma on opioids who presented to the hospital on 09/23/23 with mild apasia. His wife drove him to the ED. MRI of brain showed Multifocal acute ischemia within the left temporal and parietal lobes. Pt was hospitalized 8/17-8/19/25 and discharged home with his wife. He was referred for outpatient SLP therapy by Dr. Pfeiffer Jerri.  PAIN:  Are you having pain? No  FALLS: Has patient fallen in last 6 months?  No  LIVING ENVIRONMENT: Lives with: lives with their spouse Lives in: House/apartment  PLOF:  Level of assistance: Independent with ADLs, Independent with IADLs Employment: Retired  PATIENT GOALS: Improve speech  OBJECTIVE:  Note: Objective measures were completed at Evaluation unless  otherwise noted.  DIAGNOSTIC FINDINGS:   MRI BRAIN WITH AND WITHOUT CONTRASTIMPRESSION: 1. Multifocal acute ischemia within the left temporal and parietal lobes. 2. Multifocal hyperintense T2-weighted signal within the cerebral white matter, most commonly due to chronic small vessel disease.   Electronically signed by: Franky Stanford MD 09/23/2023                                                                                                                   TREATMENT DATE: 10/01/23 Pt  was accompanied to therapy by his wife, Craig Rivera. He did not fully complete his communication support template because he spilled cough medicine on it. SLP provided a new copy and we worked on completing in our session. He was asked to list names of family members which he did with 80% acc (assist for grandchildren) and names of restaurants he frequents (70% acc) and he required mod assist for association, visualization, and use of letter board for: [employer], [employer], [employer], and his bank. SLP provided education to wife on how to best cue her husband when he gets stuck. Pt also benefited from look away and return to task. Pt to complete the remainder of the communication support template before next session. Continue targeting goals.   PATIENT EDUCATION: Education details: Complete communication support template and bring to next session Person educated: Patient and Spouse Education method: Explanation, Demonstration, and Handouts Education comprehension: verbalized understanding   GOALS: Goals reviewed with patient? Yes  SHORT TERM GOALS: Target date: 11/01/2023  Pt will implement word-finding strategies with 90% accuracy when unable to verbalize desired word in conversation/functional tasks with min assist. Baseline: 70% Goal status: ONGOING  2.  Pt will describe objects and pictures by providing at least three salient features as judged by clinician with 90% acc when provided min cues.  Baseline: 75% Goal status: ONGOING  3.  Pt will complete moderately complex abstract divergent naming tasks with 90% acc with min cues.  Baseline: mod cues Goal status: ONGOING  4.  Pt will complete high level verbal expression tasks (object description, state function, provide short summary, etc) using short sentences with 90% acc and min assist. Baseline: 70% Goal status: ONGOING  5.  Pt will respond to open-ended questions using a complete sentence with 80% acc and min  cues. Baseline:  Goal status: ONGOING  6.  Pt will follow multistep auditory directions with 90% acc when provided min cues. Baseline: mod cues and repetition required Goal status: ONGOING  LONG TERM GOALS: Same as short term goals   ASSESSMENT:  CLINICAL IMPRESSION: (initial evaluation 09/27/23) Patient is a 68 y.o. male who was seen today for a cognitive linguistic evaluation. Pt presents with mild/mod expressive language deficits and mild receptive language deficits which negatively impacts executive functioning. Pt primarily presents with dysnomia and reduced length of utterances for answering open ended questions. He had difficulty listing hobbies, restaurants he frequents, and other important names. He was able to follow 2-step directions, but breakdowns occurred with lengthier/complex  auditory directions. 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 He is motivated to improve his language skills and has excellent family support.   OBJECTIVE IMPAIRMENTS: include expressive language, receptive language, and aphasia. These impairments are limiting patient from effectively communicating at home and in community. Factors affecting potential to achieve goals and functional outcome are previous level of function and family/community support. Patient will benefit from skilled SLP services to address above impairments and improve overall function.  REHAB POTENTIAL: Excellent  PLAN:  SLP FREQUENCY: 2x/week  SLP DURATION: 4 weeks  PLANNED INTERVENTIONS: Cueing hierachy, Internal/external aids, Functional tasks, Multimodal communication approach, SLP instruction and feedback, Compensatory strategies, Patient/family education, [phone] Treatment of speech (30 or 45 min) , and 07476- Speech [address], Artic, Phon, Eval Compre, Express   Thank you,  Lamar Candy,  CCC-SLP [phone]  Cristie Mckinney, CCC-SLP 10/01/2023, 10:11 AM

## 2023-10-02 DIAGNOSIS — Z8673 Personal history of transient ischemic attack (TIA), and cerebral infarction without residual deficits: Secondary | ICD-10-CM | POA: Diagnosis not present

## 2023-10-02 DIAGNOSIS — I69320 Aphasia following cerebral infarction: Secondary | ICD-10-CM | POA: Diagnosis not present

## 2023-10-02 DIAGNOSIS — E041 Nontoxic single thyroid nodule: Secondary | ICD-10-CM | POA: Diagnosis not present

## 2023-10-02 DIAGNOSIS — Z1159 Encounter for screening for other viral diseases: Secondary | ICD-10-CM | POA: Diagnosis not present

## 2023-10-02 DIAGNOSIS — Z5111 Encounter for antineoplastic chemotherapy: Secondary | ICD-10-CM | POA: Diagnosis not present

## 2023-10-02 DIAGNOSIS — G4730 Sleep apnea, unspecified: Secondary | ICD-10-CM | POA: Diagnosis not present

## 2023-10-02 DIAGNOSIS — Z79899 Other long term (current) drug therapy: Secondary | ICD-10-CM | POA: Diagnosis not present

## 2023-10-02 DIAGNOSIS — Z7901 Long term (current) use of anticoagulants: Secondary | ICD-10-CM | POA: Diagnosis not present

## 2023-10-02 DIAGNOSIS — Z9484 Stem cells transplant status: Secondary | ICD-10-CM | POA: Diagnosis not present

## 2023-10-02 DIAGNOSIS — C9000 Multiple myeloma not having achieved remission: Secondary | ICD-10-CM | POA: Diagnosis not present

## 2023-10-03 ENCOUNTER — Encounter (HOSPITAL_COMMUNITY): Payer: Self-pay | Admitting: Speech Pathology

## 2023-10-03 ENCOUNTER — Ambulatory Visit (HOSPITAL_COMMUNITY): Admitting: Speech Pathology

## 2023-10-03 ENCOUNTER — Ambulatory Visit (HOSPITAL_COMMUNITY): Admitting: Occupational Therapy

## 2023-10-03 ENCOUNTER — Encounter (HOSPITAL_COMMUNITY): Payer: Self-pay | Admitting: Occupational Therapy

## 2023-10-03 ENCOUNTER — Other Ambulatory Visit: Payer: Self-pay

## 2023-10-03 DIAGNOSIS — R41841 Cognitive communication deficit: Secondary | ICD-10-CM

## 2023-10-03 DIAGNOSIS — R29818 Other symptoms and signs involving the nervous system: Secondary | ICD-10-CM

## 2023-10-03 DIAGNOSIS — R4701 Aphasia: Secondary | ICD-10-CM

## 2023-10-03 NOTE — Therapy (Signed)
 OUTPATIENT SPEECH LANGUAGE PATHOLOGY TREATMENT   Patient Name: Craig Rivera MRN: [id number] DOB:Jan 03, 1956, 68 y.o., male Today's Date: 10/03/2023  PCP: Shona Norleen PEDLAR, MD REFERRING PROVIDER: Jerri Pfeiffer, MD  END OF SESSION:  End of Session - 10/03/23 1145     Visit Number 3    Number of Visits 9    Date for SLP Re-Evaluation 11/06/23    Authorization Type [employer]   eff:02/07/23 oop: copay 5 auth no sw jacquline ref # [id number]   SLP Start Time 1130    SLP Stop Time  1215    SLP Time Calculation (min) 45 min    Activity Tolerance Patient tolerated treatment well          Past Medical History:  Diagnosis Date   Back pain    DVT (deep venous thrombosis) (HCC)    GERD (gastroesophageal reflux disease)    HTN (hypertension)    Hypercholesterolemia    Multiple myeloma (HCC)    Sciatic nerve pain    Past Surgical History:  Procedure Laterality Date   BONE MARROW TRANSPLANT     CATARACT EXTRACTION W/PHACO Right 08/11/2014   Procedure: CATARACT EXTRACTION PHACO AND INTRAOCULAR LENS PLACEMENT (IOC);  Surgeon: Dow JULIANNA Burke, MD;  Location: [facility];  Service: Ophthalmology;  Laterality: Right;  CDE 4.88   CATARACT EXTRACTION W/PHACO Left 10/26/2014   Procedure: CATARACT EXTRACTION PHACO AND INTRAOCULAR LENS PLACEMENT LEFT EYE CDE=8.64;  Surgeon: Dow JULIANNA Burke, MD;  Location: [facility];  Service: Ophthalmology;  Laterality: Left;   COLONOSCOPY  09/12/2010   Procedure: COLONOSCOPY;  Surgeon: Lamar CHRISTELLA Hollingshead, MD;  [facility];  Service: Endoscopy;  Laterality: N/A;   COLONOSCOPY WITH PROPOFOL  N/A 01/06/2022   Procedure: COLONOSCOPY WITH PROPOFOL ;  Surgeon: Cindie Carlin POUR, DO;  [facility];  Service: Endoscopy;  Laterality: N/A;  10:00am   inguinal hernia repair bilateral     as child    KNEE SURGERY Right    arthroscopy   POLYPECTOMY  01/06/2022   Procedure: POLYPECTOMY;  Surgeon: Cindie Carlin POUR, DO;  [facility];  Service:  Endoscopy;;   TONSILLECTOMY     Patient Active Problem List   Diagnosis Date Noted   Acute ischemic left MCA stroke (HCC) 09/23/2023   Positive colorectal cancer screening using Cologuard test 12/09/2021   Hypokalemia 11/09/2015   Multiple myeloma (HCC) 07/22/2015   Encounter for screening colonoscopy 08/30/2010    ONSET DATE: 09/23/2023   REFERRING DIAG: I63.9 (ICD-10-CM) - Stroke, acute, thrombotic (HCC)  THERAPY DIAG:  Aphasia  Cognitive communication deficit  Rationale for Evaluation and Treatment: Rehabilitation  SUBJECTIVE:   SUBJECTIVE STATEMENT: Yesterday, I worked a little more at home.  Pt accompanied by: self and significant other  PERTINENT HISTORY: Patient is a 68 y.o. male with PMH: HTN, HLD, multiple myeloma on opioids who presented to the hospital on 09/23/23 with mild apasia. His wife drove him to the ED. MRI of brain showed Multifocal acute ischemia within the left temporal and parietal lobes. Pt was hospitalized 8/17-8/19/25 and discharged home with his wife. He was referred for outpatient SLP therapy by Dr. Pfeiffer Jerri.  PAIN:  Are you having pain? No  FALLS: Has patient fallen in last 6 months?  No  LIVING ENVIRONMENT: Lives with: lives with their spouse Lives in: House/apartment  PLOF:  Level of assistance: Independent with ADLs, Independent with IADLs Employment: Retired  PATIENT GOALS: Improve speech  OBJECTIVE:  Note: Objective measures were  completed at Evaluation unless otherwise noted.  DIAGNOSTIC FINDINGS:   MRI BRAIN WITH AND WITHOUT CONTRASTIMPRESSION: 1. Multifocal acute ischemia within the left temporal and parietal lobes. 2. Multifocal hyperintense T2-weighted signal within the cerebral white matter, most commonly due to chronic small vessel disease.   Electronically signed by: Craig Stanford MD 09/23/2023                                                                                                                   Previous  Treatment: Pt was accompanied to therapy by his wife, Jan. He did not fully complete his communication support template because he spilled cough medicine on it. SLP provided a new copy and we worked on completing in our session. He was asked to list names of family members which he did with 80% acc (assist for grandchildren) and names of restaurants he frequents (70% acc) and he required mod assist for association, visualization, and use of letter board for: [employer], [employer], [employer], and his bank. SLP provided education to wife on how to best cue her husband when he gets stuck. Pt also benefited from look away and return to task. Pt to complete the remainder of the communication support template before next session. Continue targeting goals.  TREATMENT DATE: 10/03/23  Pt arrived unaccompanied to therapy today as his wife is at work. SLP noted immediately that Pt had more difficulty communicating today, however he denies any other symptoms (slight headache). Pt without facial asymmetry and no dysarthria- just very delayed responses and increased difficulty with word finding. He stated that he mulched the yard yesterday and it took 3-4 hours, but he rested periodically. He then went to visit a friend in the hospital with his wife. Pt only had his stroke 10 days ago and SLP reinforced the importance of rest, hydration, and food for his recovery. It appears that he overdid it yesterday and is experiencing fatigue and therefore increased dysnomia today. SLP facilitated completion of his communication support template, by adding: bill paying, medications, hobbies, medical professionals, and other important medical information. Pt had trouble verbalizing his phone number and address today, but benefited from verbal cues. He was able to read list of friends with indirect cues and expand on their relationships. Pt will return in an hour for his OT evaluation. HEP provided.   PATIENT  EDUCATION: Education details: Divergent naming with word bank  Person educated: Patient and Spouse Education method: Explanation, Demonstration, and Handouts Education comprehension: verbalized understanding   GOALS: Goals reviewed with patient? Yes  SHORT TERM GOALS: Target date: 11/01/2023  Pt will implement word-finding strategies with 90% accuracy when unable to verbalize desired word in conversation/functional tasks with min assist. Baseline: 70% Goal status: ONGOING  2.  Pt will describe objects and pictures by providing at least three salient features as judged by clinician with 90% acc when provided min cues.  Baseline: 75% Goal status: ONGOING  3.  Pt will complete moderately complex abstract divergent naming tasks with  90% acc with min cues.  Baseline: mod cues Goal status: ONGOING  4.  Pt will complete high level verbal expression tasks (object description, state function, provide short summary, etc) using short sentences with 90% acc and min assist. Baseline: 70% Goal status: ONGOING  5.  Pt will respond to open-ended questions using a complete sentence with 80% acc and min cues. Baseline:  Goal status: ONGOING  6.  Pt will follow multistep auditory directions with 90% acc when provided min cues. Baseline: mod cues and repetition required Goal status: ONGOING  LONG TERM GOALS: Same as short term goals   ASSESSMENT:  CLINICAL IMPRESSION: (initial evaluation 09/27/23) Patient is a 68 y.o. male who was seen today for a cognitive linguistic evaluation. Pt presents with mild/mod expressive language deficits and mild receptive language deficits which negatively impacts executive functioning. Pt primarily presents with dysnomia and reduced length of utterances for answering open ended questions. He had difficulty listing hobbies, restaurants he frequents, and other important names. He was able to follow 2-step directions, but breakdowns occurred with lengthier/complex  auditory directions. 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 He is motivated to improve his language skills and has excellent family support.   OBJECTIVE IMPAIRMENTS: include expressive language, receptive language, and aphasia. These impairments are limiting patient from effectively communicating at home and in community. Factors affecting potential to achieve goals and functional outcome are previous level of function and family/community support. Patient will benefit from skilled SLP services to address above impairments and improve overall function.  REHAB POTENTIAL: Excellent  PLAN:  SLP FREQUENCY: 2x/week  SLP DURATION: 4 weeks  PLANNED INTERVENTIONS: Cueing hierachy, Internal/external aids, Functional tasks, Multimodal communication approach, SLP instruction and feedback, Compensatory strategies, Patient/family education, [phone] Treatment of speech (30 or 45 min) , and [address]476- Speech [address], Artic, Phon, Eval Compre, Express   Thank you,  Lamar Candy, CCC-SLP [phone]  Selma Mink, CCC-SLP 10/03/2023, 11:47 AM

## 2023-10-03 NOTE — Therapy (Signed)
 OUTPATIENT OCCUPATIONAL THERAPY NEURO EVALUATION  Patient Name: YOSHITO GAZA MRN: [id number] DOB:21-Feb-1955, 68 y.o., male Today's Date: 10/03/2023    END OF SESSION:  OT End of Session - 10/03/23 1413     Visit Number 1    Number of Visits 1    Date for OT Re-Evaluation 10/04/23    Authorization Type [employer]    Authorization Time Period no auth required    Progress Note Due on Visit 10    OT Start Time 1346    OT Stop Time 1408    OT Time Calculation (min) 22 min    Activity Tolerance Patient tolerated treatment well    Behavior During Therapy WFL for tasks assessed/performed          Past Medical History:  Diagnosis Date   Back pain    DVT (deep venous thrombosis) (HCC)    GERD (gastroesophageal reflux disease)    HTN (hypertension)    Hypercholesterolemia    Multiple myeloma (HCC)    Sciatic nerve pain    Past Surgical History:  Procedure Laterality Date   BONE MARROW TRANSPLANT     CATARACT EXTRACTION W/PHACO Right 08/11/2014   Procedure: CATARACT EXTRACTION PHACO AND INTRAOCULAR LENS PLACEMENT (IOC);  Surgeon: Dow JULIANNA Burke, MD;  Location: [facility];  Service: Ophthalmology;  Laterality: Right;  CDE 4.88   CATARACT EXTRACTION W/PHACO Left 10/26/2014   Procedure: CATARACT EXTRACTION PHACO AND INTRAOCULAR LENS PLACEMENT LEFT EYE CDE=8.64;  Surgeon: Dow JULIANNA Burke, MD;  Location: [facility];  Service: Ophthalmology;  Laterality: Left;   COLONOSCOPY  09/12/2010   Procedure: COLONOSCOPY;  Surgeon: Lamar CHRISTELLA Hollingshead, MD;  [facility];  Service: Endoscopy;  Laterality: N/A;   COLONOSCOPY WITH PROPOFOL  N/A 01/06/2022   Procedure: COLONOSCOPY WITH PROPOFOL ;  Surgeon: Cindie Carlin POUR, DO;  [facility];  Service: Endoscopy;  Laterality: N/A;  10:00am   inguinal hernia repair bilateral     as child    KNEE SURGERY Right    arthroscopy   POLYPECTOMY  01/06/2022   Procedure: POLYPECTOMY;  Surgeon: Cindie Carlin POUR, DO;  [facility];  Service: Endoscopy;;   TONSILLECTOMY     Patient Active Problem List   Diagnosis Date Noted   Acute ischemic left MCA stroke (HCC) 09/23/2023   Positive colorectal cancer screening using Cologuard test 12/09/2021   Hypokalemia 11/09/2015   Multiple myeloma (HCC) 07/22/2015   Encounter for screening colonoscopy 08/30/2010   PCP: Dr. Norleen Hurst  REFERRING PROVIDER: Dr. Ary Cummins  ONSET DATE: 09/23/23  REFERRING DIAG: I63.9 (ICD-10-CM) - Stroke, acute, thrombotic (HCC)   THERAPY DIAG:  Other symptoms and signs involving the nervous system  Rationale for Evaluation and Treatment: Rehabilitation  SUBJECTIVE:   SUBJECTIVE STATEMENT: S: I couldn't read the bible study Pt accompanied by: self  PERTINENT HISTORY: Pt is a 68 y/o male s/p L MCA CVA on 09/23/23 who presented to [facility] with R inattention and decreased coordination, as well as word finding difficulties.   PRECAUTIONS: None  WEIGHT BEARING RESTRICTIONS: No  PAIN:  Are you having pain? Yes: NPRS scale: 2/10 Pain location: headache Pain description: aching Aggravating factors: unsure Relieving factors: unsure  FALLS: Has patient fallen in last 6 months? No  PLOF: Independent  PATIENT GOALS: To return to improved functioning  OBJECTIVE:  Note: Objective measures were completed at Evaluation unless otherwise noted.  HAND DOMINANCE: Right  ADLs: Overall ADLs: Pt reports independence in all ADLs. Has not  noticed difficulty with tedious tasks.   UPPER EXTREMITY ROM:     A/ROM is WNL in BUE  UPPER EXTREMITY MMT:     MMT Right eval  Shoulder flexion   Shoulder abduction   Shoulder adduction   Shoulder extension   Shoulder internal rotation   Shoulder external rotation   Middle trapezius   Lower trapezius   Elbow flexion   Elbow extension   Wrist flexion   Wrist extension   Wrist ulnar deviation   Wrist radial deviation   Wrist pronation   Wrist supination   (Blank rows = not  tested)  HAND FUNCTION: Grip strength: Right: 95 lbs; Left: 110 lbs, Lateral pinch: Right: 20 lbs, Left: 24 lbs, and 3 point pinch: Right: 19 lbs, Left: 17 lbs  COORDINATION: 9 Hole Peg test: Right: 34.48 sec; Left: 29.60 sec  SENSATION: Neuropathy in bilateral hands/feet at baseline  EDEMA: None  COGNITION: Overall cognitive status: Within functional limits for tasks assessed  VISION: Subjective report: No change Baseline vision: Wears glasses for reading only Visual history: cataracts    PATIENT EDUCATION: Education details: evaluation findings Person educated: Patient Education method: Explanation, Demonstration, and Handouts Education comprehension: verbalized understanding and returned demonstration  HOME EXERCISE PROGRAM: Eval: continue to incorporate RUE into ADLs.    GOALS: Goals reviewed with patient? No-no skilled intervention required  SHORT TERM GOALS: Target date: 10/03/23    ASSESSMENT:  CLINICAL IMPRESSION: Patient is a 68 y.o. male who was seen today for occupational therapy evaluation s/p CVA on 09/23/23. Pt reports word finding difficulties and difficulty with reading. Pt with BUE strength and coordination WFL. Reports no difficulty with ADLs or tedious tasks. Pt with no further skilled OT needs at this time.   PERFORMANCE DEFICITS: in functional skills including ADLs and IADLs  IMPAIRMENTS: are limiting patient from ADLs and IADLs.   CO-MORBIDITIES: has no other co-morbidities that affects occupational performance. Patient will benefit from skilled OT to address above impairments and improve overall function.  MODIFICATION OR ASSISTANCE TO COMPLETE EVALUATION: No modification of tasks or assist necessary to complete an evaluation.  OT OCCUPATIONAL PROFILE AND HISTORY: Problem focused assessment: Including review of records relating to presenting problem.  CLINICAL DECISION MAKING: LOW - limited treatment options, no task modification  necessary  REHAB POTENTIAL: Good  EVALUATION COMPLEXITY: Low    PLAN:  OT FREQUENCY: one time visit  OT DURATION: 1 week  PLANNED INTERVENTIONS: [id number] OT Re-evaluation  RECOMMENDED OTHER SERVICES: Speech therapy  CONSULTED AND AGREED WITH PLAN OF CARE: Patient  PLAN FOR NEXT SESSION: N/A-no further skilled OT services required at this time     Sonny Cory, OTR/L  [phone] 10/03/2023, 2:14 PM

## 2023-10-04 ENCOUNTER — Emergency Department (HOSPITAL_COMMUNITY)

## 2023-10-04 ENCOUNTER — Encounter (HOSPITAL_COMMUNITY): Payer: Self-pay | Admitting: Neurology

## 2023-10-04 ENCOUNTER — Inpatient Hospital Stay (HOSPITAL_COMMUNITY)
Admission: EM | Admit: 2023-10-04 | Discharge: 2023-10-06 | DRG: 064 | Disposition: A | Discharge status: Home / Self Care | Attending: Neurology | Admitting: Neurology

## 2023-10-04 ENCOUNTER — Other Ambulatory Visit: Payer: Self-pay

## 2023-10-04 DIAGNOSIS — I63512 Cerebral infarction due to unspecified occlusion or stenosis of left middle cerebral artery: Principal | ICD-10-CM | POA: Diagnosis present

## 2023-10-04 DIAGNOSIS — D6869 Other thrombophilia: Secondary | ICD-10-CM

## 2023-10-04 DIAGNOSIS — Z7982 Long term (current) use of aspirin: Secondary | ICD-10-CM

## 2023-10-04 DIAGNOSIS — I10 Essential (primary) hypertension: Secondary | ICD-10-CM

## 2023-10-04 DIAGNOSIS — Z8673 Personal history of transient ischemic attack (TIA), and cerebral infarction without residual deficits: Secondary | ICD-10-CM | POA: Diagnosis not present

## 2023-10-04 DIAGNOSIS — R4702 Dysphasia: Secondary | ICD-10-CM | POA: Diagnosis present

## 2023-10-04 DIAGNOSIS — I639 Cerebral infarction, unspecified: Secondary | ICD-10-CM | POA: Diagnosis present

## 2023-10-04 DIAGNOSIS — R4701 Aphasia: Secondary | ICD-10-CM | POA: Diagnosis present

## 2023-10-04 DIAGNOSIS — I6602 Occlusion and stenosis of left middle cerebral artery: Secondary | ICD-10-CM | POA: Diagnosis not present

## 2023-10-04 DIAGNOSIS — F17210 Nicotine dependence, cigarettes, uncomplicated: Secondary | ICD-10-CM | POA: Diagnosis present

## 2023-10-04 DIAGNOSIS — Z880 Allergy status to penicillin: Secondary | ICD-10-CM | POA: Diagnosis not present

## 2023-10-04 DIAGNOSIS — Z888 Allergy status to other drugs, medicaments and biological substances status: Secondary | ICD-10-CM | POA: Diagnosis not present

## 2023-10-04 DIAGNOSIS — E7800 Pure hypercholesterolemia, unspecified: Secondary | ICD-10-CM | POA: Diagnosis not present

## 2023-10-04 DIAGNOSIS — K219 Gastro-esophageal reflux disease without esophagitis: Secondary | ICD-10-CM | POA: Diagnosis present

## 2023-10-04 DIAGNOSIS — Z7901 Long term (current) use of anticoagulants: Secondary | ICD-10-CM | POA: Diagnosis not present

## 2023-10-04 DIAGNOSIS — R471 Dysarthria and anarthria: Secondary | ICD-10-CM | POA: Diagnosis not present

## 2023-10-04 DIAGNOSIS — R29701 NIHSS score 1: Secondary | ICD-10-CM | POA: Diagnosis not present

## 2023-10-04 DIAGNOSIS — Z7902 Long term (current) use of antithrombotics/antiplatelets: Secondary | ICD-10-CM

## 2023-10-04 DIAGNOSIS — Z9481 Bone marrow transplant status: Secondary | ICD-10-CM

## 2023-10-04 DIAGNOSIS — Z79899 Other long term (current) drug therapy: Secondary | ICD-10-CM | POA: Diagnosis not present

## 2023-10-04 DIAGNOSIS — I618 Other nontraumatic intracerebral hemorrhage: Secondary | ICD-10-CM | POA: Diagnosis present

## 2023-10-04 DIAGNOSIS — R1310 Dysphagia, unspecified: Secondary | ICD-10-CM | POA: Diagnosis present

## 2023-10-04 DIAGNOSIS — R531 Weakness: Secondary | ICD-10-CM

## 2023-10-04 DIAGNOSIS — Z5112 Encounter for antineoplastic immunotherapy: Secondary | ICD-10-CM | POA: Diagnosis not present

## 2023-10-04 DIAGNOSIS — E669 Obesity, unspecified: Secondary | ICD-10-CM | POA: Diagnosis not present

## 2023-10-04 DIAGNOSIS — I69320 Aphasia following cerebral infarction: Secondary | ICD-10-CM | POA: Diagnosis not present

## 2023-10-04 DIAGNOSIS — C9000 Multiple myeloma not having achieved remission: Secondary | ICD-10-CM | POA: Diagnosis not present

## 2023-10-04 DIAGNOSIS — R29704 NIHSS score 4: Secondary | ICD-10-CM | POA: Diagnosis not present

## 2023-10-04 DIAGNOSIS — Z1159 Encounter for screening for other viral diseases: Secondary | ICD-10-CM | POA: Diagnosis not present

## 2023-10-04 DIAGNOSIS — R4781 Slurred speech: Secondary | ICD-10-CM | POA: Diagnosis not present

## 2023-10-04 DIAGNOSIS — Z86718 Personal history of other venous thrombosis and embolism: Secondary | ICD-10-CM | POA: Diagnosis not present

## 2023-10-04 DIAGNOSIS — Z6829 Body mass index (BMI) 29.0-29.9, adult: Secondary | ICD-10-CM

## 2023-10-04 DIAGNOSIS — E785 Hyperlipidemia, unspecified: Secondary | ICD-10-CM | POA: Diagnosis not present

## 2023-10-04 DIAGNOSIS — I6601 Occlusion and stenosis of right middle cerebral artery: Secondary | ICD-10-CM | POA: Diagnosis not present

## 2023-10-04 DIAGNOSIS — Z9484 Stem cells transplant status: Secondary | ICD-10-CM | POA: Diagnosis not present

## 2023-10-04 DIAGNOSIS — R29818 Other symptoms and signs involving the nervous system: Secondary | ICD-10-CM | POA: Diagnosis not present

## 2023-10-04 LAB — COMPREHENSIVE METABOLIC PANEL WITH GFR
ALT: 12 U/L (ref 0–44)
AST: 13 U/L — ABNORMAL LOW (ref 15–41)
Albumin: 3.8 g/dL (ref 3.5–5.0)
Alkaline Phosphatase: 53 U/L (ref 38–126)
Anion gap: 11 (ref 5–15)
BUN: 16 mg/dL (ref 8–23)
CO2: 26 mmol/L (ref 22–32)
Calcium: 9.2 mg/dL (ref 8.9–10.3)
Chloride: 99 mmol/L (ref 98–111)
Creatinine, Ser: 1.26 mg/dL — ABNORMAL HIGH (ref 0.61–1.24)
GFR, Estimated: >60 mL/min (ref >60)
Glucose, Bld: 120 mg/dL — ABNORMAL HIGH (ref 70–99)
Potassium: 3.5 mmol/L (ref 3.5–5.1)
Sodium: 136 mmol/L (ref 135–145)
Total Bilirubin: 0.8 mg/dL (ref 0.0–1.2)
Total Protein: 6.7 g/dL (ref 6.5–8.1)

## 2023-10-04 LAB — DIFFERENTIAL
Abs Immature Granulocytes: 0.01 K/uL (ref 0.00–0.07)
Basophils Absolute: 0.1 K/uL (ref 0.0–0.1)
Basophils Relative: 2 %
Eosinophils Absolute: 0.1 K/uL (ref 0.0–0.5)
Eosinophils Relative: 2 %
Immature Granulocytes: 0 %
Lymphocytes Relative: 41 %
Lymphs Abs: 1.8 K/uL (ref 0.7–4.0)
Monocytes Absolute: 1 K/uL (ref 0.1–1.0)
Monocytes Relative: 22 %
Neutro Abs: 1.5 K/uL — ABNORMAL LOW (ref 1.7–7.7)
Neutrophils Relative %: 33 %

## 2023-10-04 LAB — CBC
HCT: 41.5 % (ref 39.0–52.0)
Hemoglobin: 14.3 g/dL (ref 13.0–17.0)
MCH: 34 pg (ref 26.0–34.0)
MCHC: 34.5 g/dL (ref 30.0–36.0)
MCV: 98.6 fL (ref 80.0–100.0)
Platelets: 136 K/uL — ABNORMAL LOW (ref 150–400)
RBC: 4.21 MIL/uL — ABNORMAL LOW (ref 4.22–5.81)
RDW: 14.7 % (ref 11.5–15.5)
WBC: 4.5 K/uL (ref 4.0–10.5)
nRBC: 0 % (ref 0.0–0.2)

## 2023-10-04 LAB — GLUCOSE, CAPILLARY: Glucose-Capillary: 116 mg/dL — ABNORMAL HIGH (ref 70–99)

## 2023-10-04 LAB — PROTIME-INR
INR: 1.1 (ref 0.8–1.2)
Prothrombin Time: 14.9 s (ref 11.4–15.2)

## 2023-10-04 LAB — ETHANOL: Alcohol, Ethyl (B): <15 mg/dL (ref <15)

## 2023-10-04 LAB — HEPARIN LEVEL (UNFRACTIONATED): Heparin Unfractionated: >1.1 [IU]/mL — ABNORMAL HIGH (ref 0.30–0.70)

## 2023-10-04 LAB — CK: Total CK: 326 U/L (ref 49–397)

## 2023-10-04 LAB — CBG MONITORING, ED: Glucose-Capillary: 136 mg/dL — ABNORMAL HIGH (ref 70–99)

## 2023-10-04 LAB — APTT: aPTT: 30 s (ref 24–36)

## 2023-10-04 MED ORDER — STROKE: EARLY STAGES OF RECOVERY BOOK
Freq: Once | Status: AC
  Filled 2023-10-04: qty 1

## 2023-10-04 MED ORDER — CHLORHEXIDINE GLUCONATE CLOTH 2 % EX PADS
6.0000 | MEDICATED_PAD | Freq: Every day | CUTANEOUS | Status: DC
  Administered 2023-10-05 – 2023-10-06 (×2): 6 via TOPICAL

## 2023-10-04 MED ORDER — CLOPIDOGREL BISULFATE 75 MG PO TABS
300.0000 mg | ORAL_TABLET | Freq: Once | ORAL | Status: AC
  Administered 2023-10-04: 300 mg via ORAL
  Filled 2023-10-04: qty 4

## 2023-10-04 MED ORDER — HEPARIN (PORCINE) 25000 UT/250ML-% IV SOLN
1800.0000 [IU]/h | INTRAVENOUS | Status: DC
  Administered 2023-10-04: 1100 [IU]/h via INTRAVENOUS
  Administered 2023-10-05: 1300 [IU]/h via INTRAVENOUS
  Administered 2023-10-06: 1800 [IU]/h via INTRAVENOUS
  Filled 2023-10-04 (×3): qty 250

## 2023-10-04 MED ORDER — SODIUM CHLORIDE 0.9 % IV SOLN: INTRAVENOUS | Status: AC

## 2023-10-04 MED ORDER — SODIUM CHLORIDE 0.9 % IV BOLUS
1000.0000 mL | Freq: Once | INTRAVENOUS | Status: AC
  Administered 2023-10-04: 1000 mL via INTRAVENOUS

## 2023-10-04 MED ORDER — ASPIRIN 325 MG PO TBEC
325.0000 mg | DELAYED_RELEASE_TABLET | Freq: Once | ORAL | Status: AC
  Administered 2023-10-04: 325 mg via ORAL
  Filled 2023-10-04: qty 1

## 2023-10-04 MED ORDER — IOHEXOL 350 MG/ML SOLN
100.0000 mL | Freq: Once | INTRAVENOUS | Status: AC | PRN
  Administered 2023-10-04: 100 mL via INTRAVENOUS

## 2023-10-04 NOTE — ED Provider Notes (Signed)
 [place] EMERGENCY DEPARTMENT AT [facility] Provider Note   CSN: [id number] Arrival date & time: 10/04/23  1715  An emergency department physician performed an initial assessment on this suspected stroke patient at 1719.  Patient presents with: Aphasia and Weakness   Craig Rivera is a 68 y.o. male.   HPI     68 year old male comes in with chief complaint of acute right-sided facial droop and speech disturbance.  Patient has history of multiple myeloma, DVT on Eliquis  and was recently discharged for left MCA stroke with MCA occlusion.  At that time, patient's main complaint was speech disturbance.  His symptoms were thought to be mild enough, and therefore thrombectomy was not offered.  According to the wife, patient was last known normal at noon by her.  Patient subsequently went to the cancer center where he received treatment.  When he arrived back, she noted that patient had right-sided facial droop and his speech was more slurred and he was having some difficulty forming sentences.  Patient reports that his speech is slightly better now than it was at home.  He took his medications as prescribed including Eliquis  this morning.  Prior to Admission medications   Medication Sig Start Date End Date Taking? Authorizing Provider  acetaminophen  (TYLENOL ) 500 MG tablet Take 1,000 mg by mouth 2 (two) times daily as needed for moderate pain (pain score 4-6) or headache.    [provider]  amLODipine (NORVASC) 5 MG tablet Take 5 mg by mouth at bedtime.    [provider]  apixaban  (ELIQUIS ) 5 MG TABS tablet Take 1 tablet (5 mg total) by mouth 2 (two) times daily. 09/25/23   Elodie Palma, MD  atorvastatin  (LIPITOR) 20 MG tablet Take 1 tablet (20 mg total) by mouth daily. 09/26/23   Elodie Palma, MD  dextromethorphan-guaiFENesin [facility] DM) 30-600 MG 12hr tablet Take 1 tablet by mouth every 12 (twelve) hours.    [provider]  fentaNYL   (DURAGESIC ) 75 MCG/HR Place 1 patch onto the skin every 3 (three) days.    [provider]  hydrALAZINE  (APRESOLINE ) 25 MG tablet Take 25 mg by mouth 2 (two) times daily.    [provider]  levocetirizine (XYZAL) 5 MG tablet Take 5 mg by mouth daily.    [provider]  losartan-hydrochlorothiazide (HYZAAR) 100-12.5 MG tablet Take 1 tablet by mouth daily. 09/03/23   [provider]  losartan-hydrochlorothiazide (HYZAAR) 50-12.5 MG tablet Take 1 tablet by mouth daily.    [provider]  oxyCODONE  (OXY IR/ROXICODONE ) 5 MG immediate release tablet Take 1 tablet (5 mg total) by mouth every 4 (four) hours as needed. pain Patient taking differently: Take 5 mg by mouth every 6 (six) hours as needed for severe pain (pain score 7-10). pain 04/04/17   Letha Truman ORN, NP  pantoprazole  (PROTONIX ) 40 MG tablet Take 40 mg by mouth daily. 11/03/21   [provider]  potassium chloride  SA (KLOR-CON  M) 20 MEQ tablet Take 20 mEq by mouth at bedtime.    [provider]  Pseudoeph-Doxylamine-DM-APAP (NYQUIL PO) Take 30 mLs by mouth at bedtime.    [provider]  Pseudoephedrine-APAP-DM (DAYQUIL PO) Take 30 mLs by mouth daily.    [provider]  tamsulosin  (FLOMAX ) 0.4 MG CAPS capsule Take 0.4 mg by mouth at bedtime. 12/26/17   [provider]  valACYclovir (VALTREX) 500 MG tablet Take 500 mg by mouth 2 (two) times daily. 12/08/21   [provider]  venlafaxine  XR (EFFEXOR -XR) 75 MG 24 hr capsule Take 75 mg by mouth daily.    [provider]    Allergies: Ceclor [cefaclor], Penicillins, Revlimid [lenalidomide], and Sudafed [pseudoephedrine hcl]    Review of Systems  All other systems reviewed and are negative.   Updated Vital Signs BP (!) 143/83 (BP Location: Right Arm)   Pulse 63   Temp 98.6 F (37 C) (Oral)   Resp 18   Ht 5' 10 (1.778 m)   Wt 96 kg   SpO2 97%   BMI 30.38 kg/m   Physical  Exam Vitals and nursing note reviewed.  Constitutional:      Appearance: He is well-developed.  HENT:     Head: Atraumatic.  Eyes:     Extraocular Movements: Extraocular movements intact.     Pupils: Pupils are equal, round, and reactive to light.  Cardiovascular:     Rate and Rhythm: Normal rate.  Pulmonary:     Effort: Pulmonary effort is normal.  Musculoskeletal:     Cervical back: Neck supple.  Skin:    General: Skin is warm.  Neurological:     Mental Status: He is alert and oriented to person, place, and time.     Cranial Nerves: Cranial nerve deficit present.     Sensory: Sensory deficit present.     Motor: Weakness present.     Comments: Right-sided facial droop with central sparing, dysarthria, right upper extremity drift     (all labs ordered are listed, but only abnormal results are displayed) Labs Reviewed  CBC - Abnormal; Notable for the following components:      Result Value   RBC 4.21 (*)    Platelets 136 (*)    All other components within normal limits  DIFFERENTIAL - Abnormal; Notable for the following components:   Neutro Abs 1.5 (*)    All other components within normal limits  COMPREHENSIVE METABOLIC PANEL WITH GFR - Abnormal; Notable for the following components:   Glucose, Bld 120 (*)    Creatinine, Ser 1.26 (*)    AST 13 (*)    All other components within normal limits  CBG MONITORING, ED - Abnormal; Notable for the following components:   Glucose-Capillary 136 (*)    All other components within normal limits  PROTIME-INR  APTT  ETHANOL  RAPID URINE DRUG SCREEN, HOSP PERFORMED  I-STAT CHEM 8, ED    EKG: EKG Interpretation Date/Time:  Thursday October 04 2023 17:31:35 EDT Ventricular Rate:  56 PR Interval:  227 QRS Duration:  174 QT Interval:  464 QTC Calculation: 448 R Axis:   -78  Text Interpretation: Sinus rhythm Prolonged PR interval RBBB and LAFB Left ventricular hypertrophy No acute changes Confirmed by Charlyn Sora [phone]) on  10/04/2023 5:59:50 PM  Radiology: No results found.   .Critical Care  Performed by: Charlyn Sora, MD Authorized by: Charlyn Sora, MD   Critical care provider statement:    Critical care time (minutes):  47   Critical care was necessary to treat or prevent imminent or life-threatening deterioration of the following conditions:  CNS failure or compromise   Critical care was time spent personally by me on the following activities:  Development of treatment plan with patient or surrogate, discussions with consultants, evaluation of patient's response to treatment, examination of patient, ordering and review of laboratory studies, ordering and review of radiographic studies, ordering and performing treatments and interventions, pulse oximetry, re-evaluation of patient's condition, review of old charts and obtaining history from  patient or surrogate    Medications Ordered in the ED  iohexol  (OMNIPAQUE ) 350 MG/ML injection 100 mL (100 mLs Intravenous Contrast Given 10/04/23 1747)                      NIH Stroke Scale: 6              Medical Decision Making Amount and/or Complexity of Data Reviewed Labs: ordered. Radiology: ordered.  Risk Prescription drug management.   68 year old male comes in with chief complaint of slurred speech and right-sided facial droop.  Patient accompanied by his wife.  Patient has previous history of multiple myeloma, hypertension and was just discharged with left MCA stroke last week.  At that time, his primary complaint was mild aphasia.  I saw the patient in triage.  His last known normal was at noon.  Symptoms that started greater than 3 hours ago, he is on Eliquis , therefore patient not a TNK candidate.  I called CT to ensure that patient got rapid CT and even alerted neurologist, but decided not to activate code stroke.  However, upon further questioning, patient's wife indicated that patient was having some word finding difficulty and he did have  right upper extremity drift.  Patient's discharge summary also indicated that he had an MCA clot, but with symptoms being mild, thrombectomy was not offered. With this detail, I decided to activate code stroke and informed neurologist as well.     Final diagnoses:  Acute ischemic left middle cerebral artery (MCA) stroke [facility])    ED Discharge Orders     None          Charlyn Sora, MD 10/04/23 [phone]

## 2023-10-04 NOTE — H&P (Signed)
 NEUROLOGY CONSULT NOTE   Date of service: October 04, 2023 Patient Name: Craig Rivera MRN:  [id number] DOB:  09-29-1955 Chief Complaint: worsening aphasia, slight R facial droop Requesting Provider: Vanessa Robert, MD  History of Present Illness  Craig Rivera is a 68 y.o. male with hx of  DVT on Eliquis , GERD, hypertension, hyperlipidemia, multiple myeloma, recent patchy but small volume L MCA stroke on 09/24/23 with noted L MCA M1 severe stenosis who presents with worsening baseline aphasia.  Reports that he was doing some landscaping today and then drove to [place] to get some chemicals for his pool. Around 12 PM, noted worsening speech deficits, face seems off. He was brought in to [facility] Ed where a code stoke was activated.  He has been compliant with his medication at discharge.  He was evaluated by Dr. Merrianne over teleneurology.  CT head without contrast vertigo subacute evolving left MCA infarcts, CTA with unchanged proximal left MCA M1 occlusion with moderate to robust distal collateralization. He was switched to heparin  gtt. and started on aspirin  and Plavix  and transferred to [facility] neuro ICU for close monitoring.  LKW: 11 days ago Modified rankin score: 0-Completely asymptomatic and back to baseline post- stroke IV Thrombolysis: Not offered, patient is on Eliquis  and last seen normal is outside the window.   EVT: Not offered, too mild to treat at this time.  The plan is to closely monitor in the neuro ICU and if he worsens or develop disabling deficits, may be a candidate for intervention.    NIHSS components Score: Comment  1a Level of Conscious 0[x]  1[]  2[]  3[]      1b LOC Questions 0[x]  1[]  2[]       1c LOC Commands 0[x]  1[]  2[]       2 Best Gaze 0[x]  1[]  2[]       3 Visual 0[x]  1[]  2[]  3[]      4 Facial Palsy 0[]  1[x]  2[]  3[]      5a Motor Arm - left 0[x]  1[]  2[]  3[]  4[]  UN[]    5b Motor Arm - Right 0[x]  1[]  2[]  3[]  4[]  UN[]    6a Motor Leg -  Left 0[x]  1[]  2[]  3[]  4[]  UN[]    6b Motor Leg - Right 0[x]  1[]  2[]  3[]  4[]  UN[]    7 Limb Ataxia 0[x]  1[]  2[]  UN[]      8 Sensory 0[x]  1[]  2[]  UN[]      9 Best Language 0[]  1[x]  2[]  3[]      10 Dysarthria 0[]  1[x]  2[]  UN[]      11 Extinct. and Inattention 0[x]  1[]  2[]       TOTAL: 3      ROS  Comprehensive ROS performed and pertinent positives documented in HPI   Past History   Past Medical History:  Diagnosis Date   Back pain    DVT (deep venous thrombosis) (HCC)    GERD (gastroesophageal reflux disease)    HTN (hypertension)    Hypercholesterolemia    Multiple myeloma (HCC)    Sciatic nerve pain     Past Surgical History:  Procedure Laterality Date   BONE MARROW TRANSPLANT     CATARACT EXTRACTION W/PHACO Right 08/11/2014   Procedure: CATARACT EXTRACTION PHACO AND INTRAOCULAR LENS PLACEMENT (IOC);  Surgeon: Dow JULIANNA Burke, MD;  Location: [facility] ORS;  Service: Ophthalmology;  Laterality: Right;  CDE 4.88   CATARACT EXTRACTION W/PHACO Left 10/26/2014   Procedure: CATARACT EXTRACTION PHACO AND INTRAOCULAR LENS PLACEMENT LEFT EYE CDE=8.64;  Surgeon: Dow JULIANNA Burke, MD;  Location: [facility] ORS;  Service: Ophthalmology;  Laterality: Left;   COLONOSCOPY  09/12/2010   Procedure: COLONOSCOPY;  Surgeon: Lamar CHRISTELLA Hollingshead, MD;  Location: [facility] ENDO SUITE;  Service: Endoscopy;  Laterality: N/A;   COLONOSCOPY WITH PROPOFOL  N/A 01/06/2022   Procedure: COLONOSCOPY WITH PROPOFOL ;  Surgeon: Cindie Carlin POUR, DO;  Location: [facility] ENDO SUITE;  Service: Endoscopy;  Laterality: N/A;  10:00am   inguinal hernia repair bilateral     as child    KNEE SURGERY Right    arthroscopy   POLYPECTOMY  01/06/2022   Procedure: POLYPECTOMY;  Surgeon: Cindie Carlin POUR, DO;  Location: [facility] END[facility] SU[facility];  Service: Endoscopy;;   TONSILLECTOMY      Family History: Family History  Problem Relation Age of Onset   Colon polyps Father        passed away, [place]iers   Colon polyps Sister    Migraines Mother        deceased    Social  History  reports that he has been smoking cigarettes. He has a 20 pack-year smoking history. He has never used smokeless tobacco. He reports that he does not drink alcohol and does not use drugs.  Allergies  Allergen Reactions   Ceclor [Cefaclor] Diarrhea and Rash   Penicillins Diarrhea   Revlimid [Lenalidomide] Rash   Sudafed [Pseudoephedrine Hcl] Rash    Medications   Current Facility-Administered Medications:    [START ON 10/05/2023]  stroke: early stages of recovery book, , Does not apply, Once, Abbigal Radich, MD   0.9 %  sodium chloride  infusion, , Intravenous, Continuous, Lindzen, Eric, MD, Last Rate: 125 mL/hr at 10/04/23 2226, New Bag at 10/04/23 2226   [START ON 10/05/2023] Chlorhexidine  Gluconate Cloth 2 % PADS 6 each, 6 each, Topical, Daily, Farryn Linares, MD   heparin  ADULT infusion 100 units/mL (25000 units/250mL), 1,100 Units/hr, Intravenous, Continuous, Hunt, Madison H, RPH, Last Rate: 11 mL/hr at 10/04/23 1950, 1,100 Units/hr at 10/04/23 1950  Vitals   Vitals:   10/04/23 1915 10/04/23 1920 10/04/23 2126 10/04/23 2300  BP: (!) 155/89  (!) 163/102 (!) 147/104  Pulse: (!) 51 (!) 48 70 (!) 50  Resp: 13 13 18 12   Temp:   97.9 F (36.6 C)   TempSrc:   Oral   SpO2: 94% 92% 96% 93%  Weight:      Height:        Body mass index is 30.38 kg/m.   Physical Exam   General: Laying comfortably in bed; in no acute distress.  HENT: Normal oropharynx and mucosa. Normal external appearance of ears and nose.  Neck: Supple, no pain or tenderness  CV: No JVD. No peripheral edema.  Pulmonary: Symmetric Chest rise. Normal respiratory effort.  Abdomen: Soft to touch, non-tender.  Ext: No cyanosis, edema, or deformity  Skin: No rash. Normal palpation of skin.   Musculoskeletal: Normal digits and nails by inspection. No clubbing.   Neurologic Examination  Mental status/Cognition: Alert, oriented to self, place, month and year, good attention.  Speech/language: Slurred,  non-fluent, hesitant speech, comprehension intact, object naming intact, repetition intact.  Cranial nerves:   CN II Pupils equal and reactive to light, no VF deficits    CN III,IV,VI EOM intact, no gaze preference or deviation, no nystagmus    CN V normal sensation in V1, V2, and V3 segments bilaterally    CN VII Slight right facial droop.   CN VIII normal hearing to speech    CN IX & X normal palatal elevation,  no uvular deviation    CN XI 5/5 head turn and 5/5 shoulder shrug bilaterally    CN XII midline tongue protrusion    Motor:  Muscle bulk: Normal, tone normal Mvmt Root Nerve  Muscle Right Left Comments  SA C5/6 Ax Deltoid 5 5   EF C5/6 Mc Biceps 5 5   EE C6/7/8 Rad Triceps 5 5   WF C6/7 Med FCR     WE C7/8 PIN ECU     F Ab C8/T1 U ADM/FDI 4+ 5   HF L1/2/3 Fem Illopsoas 5 5   KE L2/3/4 Fem Quad 5 5   DF L4/5 D Peron Tib Ant 5 5   PF S1/2 Tibial Grc/Sol 5 5    Sensation:  Light touch Intact throughout   Pin prick    Temperature    Vibration   Proprioception    Coordination/Complex Motor:  - Finger to Nose intact bilaterally - Heel to shin intact bilaterally - Rapid alternating movement are normal - Gait: Deferred for patient safety. Labs/Imaging/Neurodiagnostic studies   CBC:  Recent Labs  Lab 2023/10/16 1715  WBC 4.5  NEUTROABS 1.5*  HGB 14.3  HCT 41.5  MCV 98.6  PLT 136*   Basic Metabolic Panel:  Lab Results  Component Value Date   NA 136 Oct 16, 2023   K 3.5 10/16/23   CO2 26 16-Oct-2023   GLUCOSE 120 (H) 10-16-2023   BUN 16 2023/10/16   CREATININE 1.26 (H) 2023-10-16   CALCIUM  9.2 Oct 16, 2023   GFRNONAA >60 10/16/23   GFRAA >60 04/12/2017   Lipid Panel:  Lab Results  Component Value Date   LDLCALC 81 09/24/2023   HgbA1c:  Lab Results  Component Value Date   HGBA1C 5.1 09/24/2023   Urine Drug Screen:     Component Value Date/Time   LABOPIA NONE DETECTED 09/24/2023 0733   COCAINSCRNUR NONE DETECTED 09/24/2023 0733   LABBENZ NONE  DETECTED 09/24/2023 0733   AMPHETMU NONE DETECTED 09/24/2023 0733   THCU NONE DETECTED 09/24/2023 0733   LABBARB NONE DETECTED 09/24/2023 0733    Alcohol Level     Component Value Date/Time   ETH <15 October 16, 2023 1715   INR  Lab Results  Component Value Date   INR 1.1 10/16/2023   APTT  Lab Results  Component Value Date   APTT 30 2023/10/16   AED levels: No results found for: PHENYTOIN, ZONISAMIDE, LAMOTRIGINE, LEVETIRACETA  CT Head without contrast(Personally reviewed): 1. Evolving subacute left MCA infarcts with the largest in the temporoparietal junction region appearing more confluent/mildly larger than on the prior MRI. No intracranial hemorrhage or contralateral infarct.  CT angio Head and Neck with contrast(Personally reviewed): 1. Unchanged occlusion of the proximal left M1 segment with moderate to robust distal collateralization. 2. Mild right M1 stenosis. 3. Widely patent cervical carotid and vertebral arteries.  MRI Brain(Personally reviewed): Pending  ASSESSMENT   NAYTHAN DOUTHIT is a 68 y.o. male with hx of  DVT on Eliquis , GERD, hypertension, hyperlipidemia, multiple myeloma, recent patchy but small volume L MCA stroke on 09/24/23 with noted L MCA M1 severe stenosis/occlusion and residual mild aphasia who presents with worsening baseline aphasia.  RECOMMENDATIONS  - Frequent Neuro checks per stroke unit protocol - Recommend brain imaging with MRI Brain without contrast - no need to repeat TTE - no need to repeat Lipid panel - no need to repeat HbA1c - Antithrombotic -aspirin  81 mg daily along with Plavix  75 mg daily x 3 months, followed by aspirin  81 mg  daily alone. - continue heparin  gtt, neuro scale with no bolus dosing. - SBP goal - permissive hypertension first 24 h < 220/110. Held home meds.  - Recommend Telemetry monitoring for arrythmia - Recommend bedside swallow screen prior to PO intake. - Stroke education booklet - Recommend PT/OT/SLP  consult  CODE STATUS discussed with patient and patient is full code.  Allergies verified and updated in the chart.  ______________________________________________________________________  This patient is critically ill and at significant risk of neurological worsening, death and care requires constant monitoring of vital signs, hemodynamics,respiratory and cardiac monitoring, neurological assessment, discussion with family, other specialists and medical decision making of high complexity. I spent 70 minutes of neurocritical care time  in the care of  this patient. This was time spent independent of any time provided by nurse practitioner or PA.  Castin Donaghue [facility] 10/05/2023  5:21 AM   Signed, Jacia Sickman, MD [facility]

## 2023-10-04 NOTE — ED Triage Notes (Addendum)
 Pt presents with left-side facial drop, slurred speech, 1445 pm, LKW 12 pm. Per wife pt had stroke on 09/23/23

## 2023-10-04 NOTE — Progress Notes (Signed)
 Patient arrived to clinic ambulatory.  Vital signs obtained.  Patient received Darzalex  Faspro injection as ordered.  Patient tolerated well without complaint.  Patient d/c to home and aware of return appointment.

## 2023-10-04 NOTE — Progress Notes (Signed)
 PHARMACY - ANTICOAGULATION CONSULT NOTE  Pharmacy Consult for Heparin  Infusion- NO BOLUS, LOW GOAL Indication: Acute stroke symptoms due to severe left MCA stenosis. On Eliquis  for DVT PTA.  Allergies  Allergen Reactions   Ceclor [Cefaclor] Diarrhea and Rash   Penicillins Diarrhea   Revlimid [Lenalidomide] Rash   Sudafed [Pseudoephedrine Hcl] Rash    Patient Measurements: Height: 5' 10 (177.8 cm) Weight: 96 kg (211 lb 11.2 oz) IBW/kg (Calculated) : 73 HEPARIN  DW (KG): 92.7  Vital Signs: Temp: 98.6 F (37 C) (08/28 1756) Temp Source: Oral (08/28 1756) BP: 135/75 (08/28 1815) Pulse Rate: 51 (08/28 1815)  Labs: Recent Labs    10/04/23 1715  HGB 14.3  HCT 41.5  PLT 136*  APTT 30  LABPROT 14.9  INR 1.1  CREATININE 1.26*    Estimated Creatinine Clearance: 64.3 mL/min (A) (by C-G formula based on SCr of 1.26 mg/dL (H)).   Medical History: Past Medical History:  Diagnosis Date   Back pain    DVT (deep venous thrombosis) (HCC)    GERD (gastroesophageal reflux disease)    HTN (hypertension)    Hypercholesterolemia    Multiple myeloma (HCC)    Sciatic nerve pain     Medications:  On apixaban  PTA, last dose 8/28 AM  Assessment: Patient is a 68 year old male who presented to ED with acute right-sided facial droop and speech disturbance. Patient is currently on Eliquis  for DVT and was recently discharged for left MCA stroke with MCA occlusion. Pharmacy has been consulted to initiate patient on a heparin  infusion.  Baseline labs: INR- 1.1 aPTT- 30 Heparin  level- ordered Hgb- 14.3 PLT- 136 (low but at baseline)  No signs/symptoms of bleeding noted in chart.  Goal of Therapy:  Heparin  level 0.3-0.5 units/ml aPTT 66-85 seconds Monitor platelets by anticoagulation protocol: Yes   Plan:  No bolus, low goal per CVA indication Start heparin  infusion at a rate of 1100 units/hr Will monitor via aPTT levels given recent use of apixaban , will transition to  monitoring via heparin  levels once the two are correlating Check aPTT level in 6h and heparin  levels daily Monitor CBC daily while on heparin    Lum VEAR Mania, PharmD, BCPS 10/04/2023,7:06 PM

## 2023-10-04 NOTE — ED Notes (Signed)
 Paged stroke @ 17:38

## 2023-10-04 NOTE — ED Notes (Signed)
 Tele neuro finished and now speaking with radiologist

## 2023-10-04 NOTE — Consult Note (Signed)
 [facility] TeleNeurology Consult Services    Date of Service:  10/04/2023     Metrics: Last Known Well: 12:00 PM Symptoms: As per HPI.  Patient is not a candidate for thrombolytic: Outside of the time window, on anticoagulation and recent stroke 11 days previously The patient is not a candidate for emergent stenting unless he acutely worsens. Discussed with Dr. Ray and Dr. Rosemarie.   Location of the provider: [facility]  Location of the patient: [facility] Pre-Morbid Modified Rankin Scale: 1   This consult was provided via telemedicine with 2-way video and audio communication. The patient/family was informed that care would be provided in this way and agreed to receive care in this manner.   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    - Exam reveals expressive dysphasia, right facial droop and dysarthria.  - CT head: Evolving subacute left MCA infarcts with the largest in the temporoparietal junction region appearing more confluent/mildly larger than on the prior MRI. No intracranial hemorrhage or contralateral infarct. - CTA of head and neck: Unchanged occlusion of the proximal left M1 segment with moderate to robust distal collateralization. Mild right M1 stenosis. Widely patent cervical carotid and vertebral arteries. - Labs: - BUN 16, Cr 1.26.  - Na, K and Ca  normal - WBC 4.5 - Coags normal - Glucose 120 - Per wife, she was told that he had desaturations while asleep during his recent stay at [facility]. May need a BIPAP during this admission. Will need to have a sleep study scheduled outpatient. Will need continuous pulse oximetry. Wife states that he also snores during his sleep.  - Impression: Stuttering stroke symptoms in the setting of known severe left MCA stenosis. Stroke risk factors include age, prior stroke, probable hypercoagulable state in the setting of multiple myeloma and prior DVT, HTN, hypercholesterolemia, smoking and pseudoephedrine-containing decongestant medication.      Recommendations: - Transferring to the Neuro ICU at [facility]. Will be admitted under the Neurology service. Discussed with Dr. Rosemarie.  - Discontinue Dayquil, Nyquil and all other pseudoephedrine-containing medications, long term. Patient and wife educated.  - Discontinue smoking completely and do not use any other nicotine-containing products. Patient and wife educated.  - Switching Eliquis  to IV heparin  (no bolus protocol) for now.  - IVNS 1 liter bolus, then IVNS 125 cc/hr - Increase atorvastatin  to 80 mg po every day. CK level ordered.  - Neurointerventional Radiology consult in the AM. Discussed exam findings and reviewed CTA with Dr. Ray.  - Switching Eliquis  to IV heparin , no bolus protocol. Pharmacy consult has been placed and case discussed with Pharmacist over the telephone.  - ASA 325 mg po x 1 now. Plavix  300 mg po x 1 now. Discussed with Dr. Rosemarie.  - Frequent neuro checks - If he acutely worsens, obtain repeat STAT CTA of head and neck in addition to CTP. - PT/OT/Speech - Permissive HTN for the next  24 hours. Long term SBP goal may need to be in the 130-150 range.  - Discussed with Dr. Vanessa in sign out.      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 Code Stroke was called in the ED.   MRA neck with and without contrast (09/23/23): No hemodynamically significant stenosis or dissection of the neck arteries  MRA head (09/23/23):  Left middle cerebral artery occlusion at its origin.   MRI brain (09/23/23):  1. Multifocal acute ischemia within the left temporal and parietal lobes. 2. Multifocal hyperintense T2-weighted signal within the cerebral white matter, most commonly due to chronic small vessel disease.      Past Medical History: Past Medical History:  Diagnosis Date   Back pain    DVT (deep venous thrombosis) (HCC)    GERD (gastroesophageal reflux disease)    HTN (hypertension)    Hypercholesterolemia    Multiple myeloma (HCC)    Sciatic nerve pain       Past Surgical History: Past Surgical History:  Procedure Laterality Date   BONE MARROW TRANSPLANT     CATARACT EXTRACTION W/PHACO Right 08/11/2014   Procedure: CATARACT EXTRACTION PHACO AND INTRAOCULAR LENS PLACEMENT (IOC);  Surgeon: Dow JULIANNA Burke, MD;  Location: [facility];  Service: Ophthalmology;  Laterality: Right;  CDE 4.88   CATARACT EXTRACTION W/PHACO Left 10/26/2014   Procedure: CATARACT EXTRACTION PHACO AND INTRAOCULAR LENS PLACEMENT LEFT EYE CDE=8.64;  Surgeon: Dow JULIANNA Burke, MD;  Location: [facility];  Service: Ophthalmology;  Laterality: Left;   COLONOSCOPY  09/12/2010   Procedure: COLONOSCOPY;  Surgeon: Lamar CHRISTELLA Hollingshead, MD;  Location: [facility];  Service:  Endoscopy;  Laterality: N/A;   COLONOSCOPY WITH PROPOFOL  N/A 01/06/2022   Procedure: COLONOSCOPY WITH PROPOFOL ;  Surgeon: Cindie Carlin POUR, DO;  Location: [facility];  Service: Endoscopy;  Laterality: N/A;  10:00am   inguinal hernia repair bilateral     as child    KNEE SURGERY Right    arthroscopy   POLYPECTOMY  01/06/2022   Procedure: POLYPECTOMY;  Surgeon: Cindie Carlin POUR, DO;  Location: [facility];  Service: Endoscopy;;   TONSILLECTOMY       Medications:  No current facility-administered medications on file prior to encounter.   Current Outpatient Medications on File Prior to Encounter  Medication Sig Dispense Refill   acetaminophen  (TYLENOL ) 500 MG tablet Take 1,000 mg by mouth 2 (two) times daily as needed for moderate pain (pain score 4-6) or headache.     amLODipine (NORVASC) 5 MG tablet Take 5 mg by mouth at bedtime.     apixaban  (ELIQUIS ) 5 MG TABS tablet Take 1 tablet (5 mg total) by mouth 2 (two) times daily. 60 tablet 0   atorvastatin  (LIPITOR) 20 MG tablet Take 1 tablet (20 mg total) by mouth daily. 30 tablet 0   dextromethorphan-guaiFENesin (MUCINEX DM) 30-600 MG 12hr tablet Take 1 tablet by mouth every 12 (twelve) hours.     fentaNYL  (DURAGESIC ) 75 MCG/HR Place  1 patch onto the skin every 3 (three) days.     hydrALAZINE  (APRESOLINE ) 25 MG tablet Take 25 mg by mouth 2 (two) times daily.     levocetirizine (XYZAL) 5 MG tablet Take 5 mg by mouth daily.     losartan-hydrochlorothiazide (HYZAAR) 100-12.5 MG tablet Take 1 tablet by mouth daily.     losartan-hydrochlorothiazide (HYZAAR) 50-12.5 MG tablet Take 1 tablet by mouth daily.     oxyCODONE  (OXY IR/ROXICODONE ) 5 MG immediate release tablet Take 1 tablet (5 mg total) by mouth every 4 (four) hours as needed. pain (Patient taking differently: Take 5 mg by mouth every 6 (six) hours as needed for severe pain (pain score 7-10). pain) 30 tablet 0   pantoprazole  (PROTONIX ) 40 MG tablet Take 40 mg by mouth daily.      potassium chloride  SA (KLOR-CON  M) 20 MEQ tablet Take 20 mEq by mouth at bedtime.     Pseudoeph-Doxylamine-DM-APAP (NYQUIL PO) Take 30 mLs by mouth at bedtime.     Pseudoephedrine-APAP-DM (DAYQUIL PO) Take 30 mLs by mouth daily.     tamsulosin  (FLOMAX ) 0.4 MG CAPS capsule Take 0.4 mg by mouth at bedtime.     valACYclovir (VALTREX) 500 MG tablet Take 500 mg by mouth 2 (two) times daily.     venlafaxine  XR (EFFEXOR -XR) 75 MG 24 hr capsule Take 75 mg by mouth daily.        Social History: No EtOH or drug use Current smoker   Family History:  Reviewed in Epic   ROS: As per HPI    Anticoagulant use:  Eliquis    Antiplatelet use: No   Examination:    BP (!) 143/83 (BP Location: Right Arm)   Pulse 63   Resp 18   Ht 5' 10 (1.778 m)   Wt 96 kg   SpO2 97%   BMI 30.38 kg/m     1A: Level of Consciousness - 0 1B: Ask Month and Age - 0 1C: Blink Eyes & Squeeze Hands - 0 2: Test Horizontal Extraocular Movements - 0 3: Test Visual Fields - 0 4: Test Facial Palsy (Use Grimace if Obtunded) - 2 5A: Test Left Arm Motor Drift - 0 5B: Test Right Arm Motor Drift - 0 6A: Test Left Leg Motor Drift - 0 6B: Test Right Leg Motor Drift - 0 7: Test Limb Ataxia (FNF/Heel-Shin) - 0 8: Test Sensation -  0 9: Test Language/Aphasia - 1 10: Test Dysarthria - 1 11: Test Extinction/Inattention - 0   NIHSS Score: 4     Patient/Family was informed the Neurology Consult would occur via TeleHealth consult by way of interactive audio and video telecommunications and consented to receiving care in this manner.   Patient is being evaluated for possible acute neurologic impairment and high pretest probability of imminent or life-threatening deterioration. I spent total of 90 minutes providing care to this critically ill patient, including time for face to face visit via telemedicine, review of medical records, imaging studies and discussion of findings with providers, the patient and/or family.    Electronically signed: Dr. Laquiesha Piacente

## 2023-10-05 ENCOUNTER — Encounter (HOSPITAL_COMMUNITY): Admitting: Occupational Therapy

## 2023-10-05 ENCOUNTER — Inpatient Hospital Stay (HOSPITAL_COMMUNITY)

## 2023-10-05 DIAGNOSIS — I63512 Cerebral infarction due to unspecified occlusion or stenosis of left middle cerebral artery: Secondary | ICD-10-CM | POA: Diagnosis not present

## 2023-10-05 DIAGNOSIS — E785 Hyperlipidemia, unspecified: Secondary | ICD-10-CM

## 2023-10-05 DIAGNOSIS — F17210 Nicotine dependence, cigarettes, uncomplicated: Secondary | ICD-10-CM | POA: Diagnosis not present

## 2023-10-05 DIAGNOSIS — R29704 NIHSS score 4: Secondary | ICD-10-CM | POA: Diagnosis not present

## 2023-10-05 LAB — LIPID PANEL
Cholesterol: 87 mg/dL (ref 0–200)
HDL: 24 mg/dL — ABNORMAL LOW (ref >40)
LDL Cholesterol: 41 mg/dL (ref 0–99)
Total CHOL/HDL Ratio: 3.6 ratio
Triglycerides: 109 mg/dL (ref <150)
VLDL: 22 mg/dL (ref 0–40)

## 2023-10-05 LAB — CBC
HCT: 36.6 % — ABNORMAL LOW (ref 39.0–52.0)
Hemoglobin: 12.9 g/dL — ABNORMAL LOW (ref 13.0–17.0)
MCH: 33.9 pg (ref 26.0–34.0)
MCHC: 35.2 g/dL (ref 30.0–36.0)
MCV: 96.1 fL (ref 80.0–100.0)
Platelets: 110 K/uL — ABNORMAL LOW (ref 150–400)
RBC: 3.81 MIL/uL — ABNORMAL LOW (ref 4.22–5.81)
RDW: 14.6 % (ref 11.5–15.5)
WBC: 4.2 K/uL (ref 4.0–10.5)
nRBC: 0 % (ref 0.0–0.2)

## 2023-10-05 LAB — APTT
aPTT: 44 s — ABNORMAL HIGH (ref 24–36)
aPTT: 42 s — ABNORMAL HIGH (ref 24–36)

## 2023-10-05 LAB — HEPARIN LEVEL (UNFRACTIONATED): Heparin Unfractionated: >1.1 [IU]/mL — ABNORMAL HIGH (ref 0.30–0.70)

## 2023-10-05 MED ORDER — POTASSIUM CHLORIDE CRYS ER 20 MEQ PO TBCR
40.0000 meq | EXTENDED_RELEASE_TABLET | Freq: Once | ORAL | Status: AC
  Administered 2023-10-05: 40 meq via ORAL
  Filled 2023-10-05: qty 2

## 2023-10-05 MED ORDER — TAMSULOSIN HCL 0.4 MG PO CAPS
0.4000 mg | ORAL_CAPSULE | Freq: Every day | ORAL | Status: DC
  Administered 2023-10-05: 0.4 mg via ORAL
  Filled 2023-10-05: qty 1

## 2023-10-05 MED ORDER — ATORVASTATIN CALCIUM 10 MG PO TABS
20.0000 mg | ORAL_TABLET | Freq: Every day | ORAL | Status: DC
  Administered 2023-10-05 – 2023-10-06 (×2): 20 mg via ORAL
  Filled 2023-10-05 (×2): qty 2

## 2023-10-05 MED ORDER — ASPIRIN 81 MG PO CHEW
81.0000 mg | CHEWABLE_TABLET | Freq: Every day | ORAL | Status: DC
  Administered 2023-10-05 – 2023-10-06 (×2): 81 mg via ORAL
  Filled 2023-10-05 (×2): qty 1

## 2023-10-05 MED ORDER — ORAL CARE MOUTH RINSE: 15.0000 mL | OROMUCOSAL | Status: DC | PRN

## 2023-10-05 MED ORDER — FENTANYL 75 MCG/HR TD PT72
1.0000 | MEDICATED_PATCH | TRANSDERMAL | Status: DC
  Administered 2023-10-05: 1 via TRANSDERMAL
  Filled 2023-10-05: qty 1

## 2023-10-05 NOTE — Consult Note (Signed)
 NIR Consultation Note:  Recommendations: Left M1 stenosis/occlusion with robust collateralization and history of recent stroke:  Recommend optimal medical therapy.  Smoking cessation counseling performed.  Avoid vasoconstricting agents such as pseudephrine.  In this setting, endovascular therapy may be considered in selected patients with clinical deterioration (such therapy may require implantable intracranial stent placement).  Please call with questions, concerns, or worsening condition.  History of Present Illness: Craig Rivera is a 68 y.o. male with history of myeloma, DVT, and L MCA stroke (small volume 09/24/23).  Patient was at home on eliquis  with worsening aphasia and presented for clinical evaluation yesterday evening.  He was started on aspirin  and plavix  as well as heparin  infusion and transferred to [facility] for monitoring.  He has experienced some clinical improvement in his dysphagia overnight.    Past Medical History:  Diagnosis Date   Back pain    DVT (deep venous thrombosis) (HCC)    GERD (gastroesophageal reflux disease)    HTN (hypertension)    Hypercholesterolemia    Multiple myeloma (HCC)    Sciatic nerve pain     Past Surgical History:  Procedure Laterality Date   BONE MARROW TRANSPLANT     CATARACT EXTRACTION W/PHACO Right 08/11/2014   Procedure: CATARACT EXTRACTION PHACO AND INTRAOCULAR LENS PLACEMENT (IOC);  Surgeon: Dow JULIANNA Burke, MD;  Location: [facility];  Service: Ophthalmology;  Laterality: Right;  CDE 4.88   CATARACT EXTRACTION W/PHACO Left 10/26/2014   Procedure: CATARACT EXTRACTION PHACO AND INTRAOCULAR LENS PLACEMENT LEFT EYE CDE=8.64;  Surgeon: Dow JULIANNA Burke, MD;  Location: [facility];  Service: Ophthalmology;  Laterality: Left;   COLONOSCOPY  09/12/2010   Procedure: COLONOSCOPY;  Surgeon: Lamar CHRISTELLA Hollingshead, MD;  Location: [facility];  Service: Endoscopy;  Laterality: N/A;   COLONOSCOPY WITH PROPOFOL  N/A 01/06/2022   Procedure: COLONOSCOPY WITH PROPOFOL ;   Surgeon: Cindie Carlin POUR, DO;  Location: [facility];  Service: Endoscopy;  Laterality: N/A;  10:00am   inguinal hernia repair bilateral     as child    KNEE SURGERY Right    arthroscopy   POLYPECTOMY  01/06/2022   Procedure: POLYPECTOMY;  Surgeon: Cindie Carlin POUR, DO;  Location: [facility];  Service: Endoscopy;;   TONSILLECTOMY      Allergies: Ceclor [cefaclor], Penicillins, Revlimid [lenalidomide], and Sudafed [pseudoephedrine hcl]  Medications: Prior to Admission medications   Medication Sig Start Date End Date Taking? Authorizing Provider  acetaminophen  (TYLENOL ) 500 MG tablet Take 1,000 mg by mouth 2 (two) times daily as needed for moderate pain (pain score 4-6) or headache.   Yes [provider]  amLODipine (NORVASC) 5 MG tablet Take 5 mg by mouth at bedtime.   Yes [provider]  apixaban  (ELIQUIS ) 5 MG TABS tablet Take 1 tablet (5 mg total) by mouth 2 (two) times daily. 09/25/23  Yes Elodie Palma, MD  atorvastatin  (LIPITOR) 20 MG tablet Take 1 tablet (20 mg total) by mouth daily. 09/26/23  Yes Elodie Palma, MD  dextromethorphan-guaiFENesin (MUCINEX DM) 30-600 MG 12hr tablet Take 1 tablet by mouth every 12 (twelve) hours.   Yes [provider]  fentaNYL  (DURAGESIC ) 75 MCG/HR Place 1 patch onto the skin every 3 (three) days.   Yes [provider]  hydrALAZINE  (APRESOLINE ) 25 MG tablet Take 25 mg by mouth 2 (two) times daily.   Yes [provider]  levocetirizine (XYZAL) 5 MG tablet Take 5 mg by mouth daily.   Yes [provider]  losartan-hydrochlorothiazide (HYZAAR) 50-12.5  MG tablet Take 1 tablet by mouth daily.   Yes [provider]  oxyCODONE  (OXY IR/ROXICODONE ) 5 MG immediate release tablet Take 1 tablet (5 mg total) by mouth every 4 (four) hours as needed. pain Patient taking differently: Take 5 mg by mouth every 6 (six) hours as needed for severe pain (pain score 7-10). pain 04/04/17  Yes Letha Truman ORN, NP  pantoprazole  (PROTONIX ) 40 MG tablet Take 40 mg by mouth daily. 11/03/21  Yes [provider]  potassium chloride  SA (KLOR-CON  M) 20 MEQ tablet Take 20 mEq by mouth at bedtime.   Yes [provider]  Pseudoeph-Doxylamine-DM-APAP (NYQUIL PO) Take 30 mLs by mouth at bedtime.   Yes [provider]  Pseudoephedrine-APAP-DM (DAYQUIL PO) Take 30 mLs by mouth daily.   Yes [provider]  tamsulosin  (FLOMAX ) 0.4 MG CAPS capsule Take 0.4 mg by mouth at bedtime. 12/26/17  Yes [provider]  valACYclovir (VALTREX) 500 MG tablet Take 500 mg by mouth 2 (two) times daily. 12/08/21  Yes [provider]  venlafaxine  XR (EFFEXOR -XR) 75 MG 24 hr capsule Take 75 mg by mouth daily.   Yes [provider]  losartan-hydrochlorothiazide (HYZAAR) 100-12.5 MG tablet Take 1 tablet by mouth daily. 09/03/23   [provider]     Family History  Problem Relation Age of Onset   Colon polyps Father        passed away, [place]iers   Colon polyps Sister    Migraines Mother        deceased    Social History   Socioeconomic History   Marital status: Married    Spouse name: Not on file   Number of children: Not on file   Years of education: Not on file   Highest education level: Not on file  Occupational History   Not on file  Tobacco Use   Smoking status: Every Day    Current packs/day: 0.50    Average packs/day: 0.5 packs/day for 40.0 years (20.0 ttl pk-yrs)    Types: Cigarettes   Smokeless tobacco: Never  Vaping Use   Vaping status: Never Used  Substance and Sexual Activity   Alcohol use: No   Drug use: No   Sexual activity: Yes    Birth control/protection: None  Other Topics Concern   Not on file  Social History Narrative   Not on file   Social Drivers of Health   Financial Resource Strain: Low Risk  (04/25/2021)   Received from [facility]   Overall Financial Resource Strain (CARDIA)    Difficulty of Paying  Living Expenses: Not hard at all  Food Insecurity: No Food Insecurity (10/05/2023)   Hunger Vital Sign    Worried About Running Out of Food in the Last Year: Never true    Ran Out of Food in the Last Year: Never true  Transportation Needs: No Transportation Needs (10/05/2023)   PRAPARE - Administrator, Civil Service (Medical): No    Lack of Transportation (Non-Medical): No  Physical Activity: Inactive (04/19/2021)   Received from [facility]   Exercise Vital Sign    On average, how many days per week do you engage in moderate to strenuous exercise (like a brisk walk)?: 0 days    On average, how many minutes do you engage in exercise at this level?: 0 min  Stress: No Stress Concern Present (04/25/2021)   Received from [facility] -  Occupational Stress Questionnaire    Feeling of Stress : Not at all  Social Connections: Socially Integrated (10/05/2023)   Social Connection and Isolation Panel    Frequency of Communication with Friends and Family: More than three times a week    Frequency of Social Gatherings with Friends and Family: More than three times a week    Attends Religious Services: More than 4 times per year    Active Member of [employer] or Organizations: Yes    Attends Engineer, structural: More than 4 times per year    Marital Status: Married    Review of Systems: As documented  Vital Signs: BP 125/77   Pulse (!) 46   Temp 98.2 F (36.8 C) (Oral)   Resp 14   Ht 5' 10 (1.778 m)   Wt 92.2 kg   SpO2 93%   BMI 29.17 kg/m   Physical Exam:  Right facial droop RRR Nonlabored respirations Nondistended Extremities warm and well perfused Improvement in slurred speech NIHSS as per Dr. Vanessa (3)  Imaging: CT ANGIO HEAD NECK W WO CM (CODE STROKE) Result Date: 10/04/2023 EXAM: CTA HEAD AND NECK WITH AND WITHOUT 10/04/2023 05:52:58 PM TECHNIQUE: CTA of the head and neck was performed with and without  the administration of intravenous contrast. Multiplanar 2D and/or 3D reformatted images are provided for review. Automated exposure control, iterative reconstruction, and/or weight based adjustment of the mA/kV was utilized to reduce the radiation dose to as low as reasonably achievable. Stenosis of the internal carotid arteries measured using NASCET criteria. COMPARISON: MRA head and neck 09/23/2023 CLINICAL HISTORY: Neuro deficit, acute, stroke suspected. Pt presents with left-side facial drop, slurred speech, 1445 pm, LKW 12 pm. Per wife pt had stroke on 09/23/2023. FINDINGS: CTA NECK: AORTIC ARCH AND ARCH VESSELS: No dissection or arterial injury. No significant stenosis of the brachiocephalic or subclavian arteries. CERVICAL CAROTID ARTERIES: No dissection, arterial injury, or hemodynamically significant stenosis by NASCET criteria. CERVICAL VERTEBRAL ARTERIES: Patent with the left being mildly dominant. No evidence of a stenosis or dissection. Prominent tortuosity of both V2 segments. Incidental left V2 fenestration at C2-3. LUNGS AND MEDIASTINUM: Right jugular port-a-cath. Emphysema. SOFT TISSUES: No acute abnormality. BONES: Focally severe disc degeneration at C6-7. Asymmetrically advanced left-sided facet arthrosis in the upper cervical spine. CTA HEAD: ANTERIOR CIRCULATION: The intracranial internal carotid arteries are patent with mild siphon atherosclerosis but no significant stenosis. There is unchanged occlusion of the proximal left M1 segment with moderate to robust reconstitution of the distal M1 and M2 segments. The right MCA is patent with a mild M1 stenosis. Both ACAs are patent without evidence of a significant proximal stenosis. No aneurysm is identified. POSTERIOR CIRCULATION: The intracranial vertebral arteries are widely patent to the basilar. Patent left PICA, right AICA, and bilateral SCA origins are visualized. The basilar artery is widely patent. There are left larger than right posterior  communicating arteries. Both PCAs are patent without evidence of a significant proximal stenosis. No aneurysm is identified. OTHER: No dural venous sinus thrombosis on this non-dedicated study. The findings were communicated by telephone to Dr. Charlyn on 10/04/2023 at 6:02 pm. IMPRESSION: 1. Unchanged occlusion of the proximal left M1 segment with moderate to robust distal collateralization. 2. Mild right M1 stenosis. 3. Widely patent cervical carotid and vertebral arteries. Electronically signed by: Dasie Hamburg MD 10/04/2023 06:19 PM EDT RP Workstation: [id number]   CT HEAD WO CONTRAST Result Date: 10/04/2023 EXAM: CT HEAD WITHOUT CONTRAST 10/04/2023 05:51:00 PM TECHNIQUE: CT of  the head was performed without the administration of intravenous contrast. Automated exposure control, iterative reconstruction, and/or weight based adjustment of the mA/kV was utilized to reduce the radiation dose to as low as reasonably achievable. COMPARISON: Head MRI 09/23/2023 CLINICAL HISTORY: Neuro deficit, acute, stroke suspected. Pt presents with left-side facial drop, slurred speech, 1445 pm, LKW 12 pm. Per wife pt had stroke on 09/23/23. FINDINGS: BRAIN AND VENTRICLES: Multiple small recent left MCA infarcts are again seen. The largest is located near the temporoparietal junction and measures up to 3 cm in size, and patchy acute infarcts were present in this location on the prior MRI although the area of cytotoxic edema appears more confluent/mildly larger on today's CT compared to the prior MRI. No acute infarct is evident in the right cerebral hemisphere. No acute hemorrhage. No mass effect or midline shift. The ventricles are normal in size. ASPECTS: Not scored given the presence of multiple subacute infarcts. ORBITS: Bilateral cataract extraction. SINUSES: No acute abnormality. SOFT TISSUES AND SKULL: Calcified atherosclerosis at the skull base. No acute soft tissue abnormality. No skull fracture. The findings were  communicated by telephone to Dr. Charlyn on 10/04/2023 at 6:02 pm. IMPRESSION: 1. Evolving subacute left MCA infarcts with the largest in the temporoparietal junction region appearing more confluent/mildly larger than on the prior MRI. No intracranial hemorrhage or contralateral infarct. Electronically signed by: Dasie Hamburg MD 10/04/2023 06:06 PM EDT RP Workstation: [id number]   ECHOCARDIOGRAM COMPLETE Result Date: 09/24/2023    ECHOCARDIOGRAM REPORT   Patient Name:   Craig Rivera Date of Exam: 09/24/2023 Medical Rec #:  [id number]     Height:       70.0 in Accession #:    [id number]    Weight:       215.0 lb Date of Birth:  January 03, 1956    BSA:          2.152 m Patient Age:    7 years      BP:           147/93 mmHg Patient Gender: M             HR:           45 bpm. Exam Location:  Inpatient Procedure: 2D Echo, Cardiac Doppler and Color Doppler (Both Spectral and Color            Flow Doppler were utilized during procedure). Indications:    Stroke I63.9  History:        Patient has no prior history of Echocardiogram examinations.                 Risk Factors:Hypertension.  Sonographer:    Tinnie Gosling RDCS Referring Phys: [id number] [facility] IMPRESSIONS  1. Left ventricular ejection fraction, by estimation, is 55%. The left ventricle has normal function. The left ventricle has no regional wall motion abnormalities. There is moderate concentric left ventricular hypertrophy. Left ventricular diastolic parameters are consistent with Grade I diastolic dysfunction (impaired relaxation).  2. Right ventricular systolic function is normal. The right ventricular size is normal. Tricuspid regurgitation signal is inadequate for assessing PA pressure.  3. Left atrial size was mildly dilated.  4. The mitral valve is normal in structure. No evidence of mitral valve regurgitation. No evidence of mitral stenosis.  5. The aortic valve is tricuspid. Aortic valve regurgitation is not visualized. No aortic stenosis is  present.  6. Aortic dilatation noted. There is mild dilatation of the ascending aorta, measuring 40 mm.  7.  The inferior vena cava is dilated in size with >50% respiratory variability, suggesting right atrial pressure of 8 mmHg. FINDINGS  Left Ventricle: Left ventricular ejection fraction, by estimation, is 55%. The left ventricle has normal function. The left ventricle has no regional wall motion abnormalities. The left ventricular internal cavity size was normal in size. There is moderate concentric left ventricular hypertrophy. Left ventricular diastolic parameters are consistent with Grade I diastolic dysfunction (impaired relaxation). Right Ventricle: The right ventricular size is normal. No increase in right ventricular wall thickness. Right ventricular systolic function is normal. Tricuspid regurgitation signal is inadequate for assessing PA pressure. Left Atrium: Left atrial size was mildly dilated. Right Atrium: Right atrial size was normal in size. Pericardium: There is no evidence of pericardial effusion. Mitral Valve: The mitral valve is normal in structure. Mild mitral annular calcification. No evidence of mitral valve regurgitation. No evidence of mitral valve stenosis. Tricuspid Valve: The tricuspid valve is normal in structure. Tricuspid valve regurgitation is not demonstrated. Aortic Valve: The aortic valve is tricuspid. Aortic valve regurgitation is not visualized. No aortic stenosis is present. Pulmonic Valve: The pulmonic valve was normal in structure. Pulmonic valve regurgitation is not visualized. Aorta: The aortic root is normal in size and structure and aortic dilatation noted. There is mild dilatation of the ascending aorta, measuring 40 mm. Venous: The inferior vena cava is dilated in size with greater than 50% respiratory variability, suggesting right atrial pressure of 8 mmHg. IAS/Shunts: No atrial level shunt detected by color flow Doppler.  LEFT VENTRICLE PLAX 2D LVIDd:         5.79 cm    Diastology LVIDs:         3.56 cm   LV e' medial:    6.20 cm/s LV PW:         1.64 cm   LV E/e' medial:  10.3 LV IVS:        1.60 cm   LV e' lateral:   5.87 cm/s LVOT diam:     2.09 cm   LV E/e' lateral: 10.9 LV SV:         102 LV SV Index:   47 LVOT Area:     3.43 cm  RIGHT VENTRICLE             IVC RV S prime:     12.00 cm/s  IVC diam: 2.37 cm TAPSE (M-mode): 2.3 cm LEFT ATRIUM             Index        RIGHT ATRIUM           Index LA diam:        4.62 cm 2.15 cm/m   RA Area:     17.50 cm LA Vol (A2C):   47.6 ml 22.11 ml/m  RA Volume:   47.80 ml  22.21 ml/m LA Vol (A4C):   75.7 ml 35.17 ml/m LA Biplane Vol: 65.8 ml 30.57 ml/m  AORTIC VALVE LVOT Vmax:   132.00 cm/s LVOT Vmean:  83.700 cm/s LVOT VTI:    0.297 m  AORTA Ao Root diam: 3.30 cm Ao Asc diam:  3.99 cm MITRAL VALVE MV Area (PHT): 2.29 cm    SHUNTS MV Decel Time: 332 msec    Systemic VTI:  0.30 m MV E velocity: 63.90 cm/s  Systemic Diam: 2.09 cm MV A velocity: 94.90 cm/s MV E/A ratio:  0.67 Dalton McleanMD Electronically signed by Ezra Kanner Signature Date/Time: 09/24/2023/11:30:43 AM    Final  MR ANGIO HEAD WO CONTRAST Result Date: 09/23/2023 EXAM: MR Angiography Head without intravenous Contrast. 09/23/2023 06:38:51 PM TECHNIQUE: Magnetic resonance angiography images of the head without intravenous contrast. Multiplanar 2D and 3D reformatted images are provided for review. COMPARISON: None provided. CLINICAL HISTORY: Neuro deficit, acute, stroke suspected. Pt to the ED with complaints of trouble focusing, disorientation, headache, and trouble finding his words. Last known well 1015 today. Pt states he has an uncomfortable feeling in his chest. FINDINGS: ANTERIOR CIRCULATION: The left middle cerebral artery is occluded at its origin. There is moderate collateralization within the anterior division territory but poor collateralization in the posterior left MCA territory. POSTERIOR CIRCULATION: No significant stenosis of the posterior  cerebral arteries. No significant stenosis of the basilar artery. No significant stenosis of the vertebral arteries. No aneurysm. IMPRESSION: 1. Left middle cerebral artery occlusion at its origin. 2. Findings discussed with Dr. Zammit at 7:15pm 09/23/23. Electronically signed by: Franky Stanford MD 09/23/2023 07:16 PM EDT RP Workstation: [id number]   MR Angiogram Neck W or Wo Contrast Result Date: 09/23/2023 EXAM: MRA Neck without and with contrast 09/23/2023 06:38:51 PM TECHNIQUE: Multiplanar multisequence MRA of the neck was performed without and with the administration of 10mL intravenous contrast (gadobutrol  (GADAVIST ) 1 MMOL/ML injection 10 mL GADOBUTROL  1 MMOL/ML IV SOLN). 2D and 3D reformatted images are provided for review. Stenosis of the internal carotid arteries is measured using NASCET criteria. COMPARISON: None available CLINICAL HISTORY: Stroke/TIA, determine embolic source. Pt to the ED with complaints of trouble focusing, disorientation, headache, and trouble finding his words. Last known well 1015 today. Pt states he has an uncomfortable feeling in his chest. FINDINGS: CAROTID ARTERIES: No dissection. No hemodynamically significant stenosis by NASCET criteria. VERTEBRAL ARTERIES: No dissection. No significant stenosis. IMPRESSION: 1. No hemodynamically significant stenosis or dissection of the neck arteries. Electronically signed by: Franky Stanford MD 09/23/2023 07:10 PM EDT RP Workstation: [id number]   MR BRAIN W WO CONTRAST Result Date: 09/23/2023 EXAM: MRI BRAIN WITH AND WITHOUT CONTRAST 09/23/2023 06:38:51 PM TECHNIQUE: Multiplanar multisequence MRI of the head/brain was performed with and without the administration of intravenous contrast. COMPARISON: None available. CLINICAL HISTORY: Mental status change, unknown cause. Pt to the ED with complaints of trouble focusing, disorientation, headache, and trouble finding his words. Last known well 1015 today. Pt states he has an uncomfortable  feeling in his chest. FINDINGS: BRAIN AND VENTRICLES: Multifocal acute ischemia within the left temporal and parietal lobes. This is within the posterior left MCA territory and/or the left MCA/PCA watershed zone. Multifocal hyperintense T2-weighted signal within the cerebral white matter, most commonly due to chronic small vessel disease. No acute intracranial hemorrhage. No mass effect or midline shift. No hydrocephalus. The sella is unremarkable. Normal flow voids. No mass or abnormal enhancement. ORBITS: No acute abnormality. SINUSES: No acute abnormality. BONES AND SOFT TISSUES: Normal bone marrow signal and enhancement. No acute soft tissue abnormality. IMPRESSION: 1. Multifocal acute ischemia within the left temporal and parietal lobes. 2. Multifocal hyperintense T2-weighted signal within the cerebral white matter, most commonly due to chronic small vessel disease. Electronically signed by: Franky Stanford MD 09/23/2023 07:04 PM EDT RP Workstation: [id number]    Labs:  CBC: Recent Labs    09/24/23 0614 09/25/23 0640 10/04/23 1715 10/05/23 0544  WBC 4.5 4.4 4.5 4.2  HGB 13.3 12.8* 14.3 12.9*  HCT 38.0* 36.6* 41.5 36.6*  PLT 132* 127* 136* 110*    COAGS: Recent Labs    09/23/23 1930 10/04/23 1715 10/05/23 0544  INR  1.3* 1.1  --   APTT 31 30 44*    BMP: Recent Labs    09/23/23 1742 09/24/23 0614 09/25/23 0640 10/04/23 1715  NA 133* 137 136 136  K 3.2* 3.6 3.4* 3.5  CL 96* 100 100 99  CO2 28 27 28 26   GLUCOSE 104* 102* 95 120*  BUN 15 12 12 16   CALCIUM  8.8* 8.8* 8.7* 9.2  CREATININE 1.13 1.08 0.89 1.26*  GFRNONAA >60 >60 >60 >60    LIVER FUNCTION TESTS: Recent Labs    09/23/23 1742 10/04/23 1715  BILITOT 0.3 0.8  AST 11* 13*  ALT 13 12  ALKPHOS 53 53  PROT 6.5 6.7  ALBUMIN 3.6 3.8

## 2023-10-05 NOTE — Progress Notes (Signed)
 PHARMACY - ANTICOAGULATION CONSULT NOTE  Pharmacy Consult for Heparin  (neuro scale) Indication: Acute stroke symptoms due to severe left MCA stenosis. On Eliquis  for DVT PTA.  Allergies  Allergen Reactions   Ceclor [Cefaclor] Diarrhea and Rash   Penicillins Diarrhea   Revlimid [Lenalidomide] Rash   Sudafed [Pseudoephedrine Hcl] Rash    Patient Measurements: Height: 5' 10 (177.8 cm) Weight: 92.2 kg (203 lb 4.2 oz) IBW/kg (Calculated) : 73 HEPARIN  DW (KG): 92.7  Vital Signs: Temp: 98.1 F (36.7 C) (08/29 1200) Temp Source: Oral (08/29 1200) BP: 148/120 (08/29 1403) Pulse Rate: 59 (08/29 1300)  Labs: Recent Labs    10/04/23 1715 10/04/23 1940 10/05/23 0544 10/05/23 1433  HGB 14.3  --  12.9*  --   HCT 41.5  --  36.6*  --   PLT 136*  --  110*  --   APTT 30  --  44* 42*  LABPROT 14.9  --   --   --   INR 1.1  --   --   --   HEPARINUNFRC  --  >1.10* >1.10*  --   CREATININE 1.26*  --   --   --   CKTOTAL 326  --   --   --     Estimated Creatinine Clearance: 63.2 mL/min (A) (by C-G formula based on SCr of 1.26 mg/dL (H)).   Medications:  On apixaban  PTA, last dose 8/28 AM  Assessment: Patient is a 68 year old male who presented to ED with acute right-sided facial droop and speech disturbance. Patient is currently on Eliquis  for DVT and was recently discharged for left MCA stroke with MCA occlusion. Pharmacy has been consulted to initiate patient on a heparin  infusion.  aPTT subtherapeutic (42 sec) on infusion at 1300 units/hr - actually decreased after rate increased. No issues with line or bleeding reported per RN.  Goal of Therapy:  Heparin  level 0.3-0.5 units/ml aPTT 66-85 seconds Monitor platelets by anticoagulation protocol: Yes   Plan:  No bolus, low goal per CVA indication Increase heparin  infusion to 1550 units/hr Check aPTT level in 6hr  Vito Ralph, PharmD, BCPS Please see amion for complete clinical pharmacist phone list 10/05/2023 4:43 PM

## 2023-10-05 NOTE — Progress Notes (Signed)
 PHARMACY - ANTICOAGULATION CONSULT NOTE  Pharmacy Consult for Heparin  (neuro scale) Indication: Acute stroke symptoms due to severe left MCA stenosis. On Eliquis  for DVT PTA.  Allergies  Allergen Reactions   Ceclor [Cefaclor] Diarrhea and Rash   Penicillins Diarrhea   Revlimid [Lenalidomide] Rash   Sudafed [Pseudoephedrine Hcl] Rash    Patient Measurements: Height: 5' 10 (177.8 cm) Weight: 92.2 kg (203 lb 4.2 oz) IBW/kg (Calculated) : 73 HEPARIN  DW (KG): 92.7  Vital Signs: Temp: 98.2 F (36.8 C) (08/29 0400) Temp Source: Oral (08/29 0400) BP: 147/78 (08/29 0600) Pulse Rate: 44 (08/29 0600)  Labs: Recent Labs    10/04/23 1715 10/04/23 1940 10/05/23 0544  HGB 14.3  --  12.9*  HCT 41.5  --  36.6*  PLT 136*  --  110*  APTT 30  --  44*  LABPROT 14.9  --   --   INR 1.1  --   --   HEPARINUNFRC  --  >1.10*  --   CREATININE 1.26*  --   --   CKTOTAL 326  --   --     Estimated Creatinine Clearance: 63.2 mL/min (A) (by C-G formula based on SCr of 1.26 mg/dL (H)).   Medications:  On apixaban  PTA, last dose 8/28 AM  Assessment: Patient is a 68 year old male who presented to ED with acute right-sided facial droop and speech disturbance. Patient is currently on Eliquis  for DVT and was recently discharged for left MCA stroke with MCA occlusion. Pharmacy has been consulted to initiate patient on a heparin  infusion.  -AM: aPTT below goal on 1100 units/hr. No issues with heparin  infusion running continuously or signs/symptom of bleeding  Goal of Therapy:  Heparin  level 0.3-0.5 units/ml aPTT 66-85 seconds Monitor platelets by anticoagulation protocol: Yes   Plan:  No bolus, low goal per CVA indication Increase heparin  infusion to 1300 units/hr Check aPTT level in 6h  Will monitor via aPTT levels given recent use of apixaban , will transition to monitoring via heparin  levels once the two are correlating Monitor CBC daily while on heparin    Lynwood Poplar, PharmD,  BCPS Clinical Pharmacist 10/05/2023 6:16 AM

## 2023-10-05 NOTE — TOC CAGE-AID Note (Signed)
 Transition of Care [facility]) - CAGE-AID Screening   Patient Details  Name: Craig Rivera MRN: [id number] Date of Birth: 05-May-1955  Transition of Care [facility]) CM/SW Contact:    Susi Goslin E Rhyan Radler, LCSW Phone Number: 10/05/2023, 9:46 AM   Clinical Narrative: No SA noted.   CAGE-AID Screening:    Have You Ever Felt You Ought to Cut Down on Your Drinking or Drug Use?: No Have People Annoyed You By Critizing Your Drinking Or Drug Use?: No Have You Felt Bad Or Guilty About Your Drinking Or Drug Use?: No Have You Ever Had a Drink or Used Drugs First Thing In The Morning to Steady Your Nerves or to Get Rid of a Hangover?: No CAGE-AID Score: 0  Substance Abuse Education Offered: No

## 2023-10-05 NOTE — Evaluation (Signed)
 Speech Language Pathology Evaluation Patient Details Name: Craig Rivera MRN: [id number] DOB: 1955/12/23 Today's Date: 10/05/2023 Time: 8560-8495 SLP Time Calculation (min) (ACUTE ONLY): 25 min  Problem List:  Patient Active Problem List   Diagnosis Date Noted   Acute CVA (cerebrovascular accident) (HCC) 10/04/2023   Acute ischemic left middle cerebral artery (MCA) stroke (HCC) 10/04/2023   Weakness 10/04/2023   Acute ischemic left MCA stroke (HCC) 09/23/2023   Positive colorectal cancer screening using Cologuard test 12/09/2021   Hypokalemia 11/09/2015   Multiple myeloma (HCC) 07/22/2015   Encounter for screening colonoscopy 08/30/2010   Past Medical History:  Past Medical History:  Diagnosis Date   Back pain    DVT (deep venous thrombosis) (HCC)    GERD (gastroesophageal reflux disease)    HTN (hypertension)    Hypercholesterolemia    Multiple myeloma (HCC)    Sciatic nerve pain    Past Surgical History:  Past Surgical History:  Procedure Laterality Date   BONE MARROW TRANSPLANT     CATARACT EXTRACTION W/PHACO Right 08/11/2014   Procedure: CATARACT EXTRACTION PHACO AND INTRAOCULAR LENS PLACEMENT (IOC);  Surgeon: Dow JULIANNA Burke, MD;  Location: [facility];  Service: Ophthalmology;  Laterality: Right;  CDE 4.88   CATARACT EXTRACTION W/PHACO Left 10/26/2014   Procedure: CATARACT EXTRACTION PHACO AND INTRAOCULAR LENS PLACEMENT LEFT EYE CDE=8.64;  Surgeon: Dow JULIANNA Burke, MD;  Location: [facility];  Service: Ophthalmology;  Laterality: Left;   COLONOSCOPY  09/12/2010   Procedure: COLONOSCOPY;  Surgeon: Lamar CHRISTELLA Hollingshead, MD;  Location: [facility];  Service: Endoscopy;  Laterality: N/A;   COLONOSCOPY WITH PROPOFOL  N/A 01/06/2022   Procedure: COLONOSCOPY WITH PROPOFOL ;  Surgeon: Cindie Carlin POUR, DO;  Location: [facility];  Service: Endoscopy;  Laterality: N/A;  10:00am   inguinal hernia repair bilateral     as child    KNEE SURGERY Right    arthroscopy   POLYPECTOMY  01/06/2022    Procedure: POLYPECTOMY;  Surgeon: Cindie Carlin POUR, DO;  Location: [facility];  Service: Endoscopy;;   TONSILLECTOMY     HPI:  Pt is a 68 yo male presenting 8/28 with worsening aphasia and R facial droop. MRI revealed extension of L MCA territory stroke.   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 He had started OP SLP PTA.   Assessment / Plan / Recommendation Clinical Impression  Pt appears to have some acute changes in aphasia compared to evaluation during recent admission. He has a little more difficulty with receptive language (70% accuracy with yes/no questions) and two-step commands (40% accuracy). Education was given to pt and spouse about strategies to facilitate this upon discharge. He continues to have word-finding errors but seems to have more awareness of these, and can often get to the right word if he takes his time. He had started OP SLP services PTA and he and his wife are interested in resuming that. Recommend continuing OP SLP upon discharge.      SLP Assessment  SLP Recommendation/Assessment: All further Speech Language Pathology needs can be addressed in the next venue of care SLP Visit Diagnosis: Aphasia (R47.01)     Assistance Recommended at Discharge  Functional Status Assessment Patient has had a recent decline in their functional status and demonstrates the ability to make significant improvements in function in a reasonable and predictable amount of time.  Frequency and Duration           SLP Evaluation Cognition  Overall Cognitive Status: Difficult to assess  (due to aphasia, seems to attend appropriately) Orientation Level: Oriented X4       Comprehension  Auditory Comprehension Overall Auditory Comprehension: Impaired Yes/No Questions: Impaired Basic Biographical Questions: 76-100% accurate Basic Immediate Environment Questions: 50-74% accurate Commands: Impaired Two Step Basic Commands: 25-49% accurate    Expression Expression Primary Mode of Expression: Verbal Verbal Expression Overall Verbal Expression: Impaired Initiation: No impairment Automatic Speech: Name;Social Response Level of Generative/Spontaneous Verbalization: Phrase Repetition: Impaired Level of Impairment: Sentence level Naming: Impairment Confrontation: Within functional limits Divergent:  (14 words in one minute) Verbal Errors: Aware of errors Pragmatics: No impairment Non-Verbal Means of Communication: Not applicable   Oral / Motor  Motor Speech Overall Motor Speech: Appears within functional limits for tasks assessed            Leita SAILOR., M.A. CCC-SLP Acute Rehabilitation Services Office: [phone]  Secure chat preferred  10/05/2023, 3:57 PM

## 2023-10-05 NOTE — Evaluation (Signed)
 Physical Therapy Evaluation Patient Details Name: Craig Rivera MRN: [id number] DOB: 08/30/1955 Today's Date: 10/05/2023  History of Present Illness  68 yo male presents to [facility] for L facial droop, slurred speech worse vs baseline. CTH shows subacute evolving L MCA infarcts, CTA with unchanged proximal LMCA M1 occlusion with moderate to robust distal collateralization. Recent hospitalization 8/17 for L MCA CVA with L MCA M1 severe stenosis. PMH: DVT on Xarelto, GERD, HTN, HLD, multiple myeloma  Clinical Impression   Pt presents with min R inattention but is amenable to cues, expressive speech difficulty. Pt ambulated good hallway distance without AD and without difficulty, pt with no focal weakness or sensation changes at this time. PT feels deficits present now were also present when this PT evaled pt last admission. Pt with no further acute or post-acute PT needs at this time, PT to sign off.          If plan is discharge home, recommend the following:     Can travel by private vehicle        Equipment Recommendations None recommended by PT  Recommendations for Other Services       Functional Status Assessment Patient has not had a recent decline in their functional status     Precautions / Restrictions Precautions Precautions: Fall Restrictions Weight Bearing Restrictions Per Provider Order: No      Mobility  Bed Mobility Overal bed mobility: Needs Assistance Bed Mobility: Supine to Sit     Supine to sit: Supervision     General bed mobility comments: for safety    Transfers Overall transfer level: Modified independent Equipment used: None Transfers: Sit to/from Stand Sit to Stand: Modified independent (Device/Increase time)           General transfer comment: for increased time    Ambulation/Gait Ambulation/Gait assistance: Supervision Gait Distance (Feet): 200 Feet Assistive device: None Gait Pattern/deviations: Step-through pattern, Decreased  stride length Gait velocity: decr     General Gait Details: R inattention noted in pt grazing wall on R, pt does not realize until PT gives cues to attend. improved after pt aware  Stairs            Wheelchair Mobility     Tilt Bed    Modified Rankin (Stroke Patients Only) Modified Rankin (Stroke Patients Only) Pre-Morbid Rankin Score: No symptoms Modified Rankin: Moderate disability     Balance Overall balance assessment: Needs assistance Sitting-balance support: Feet supported Sitting balance-Leahy Scale: Good     Standing balance support: During functional activity Standing balance-Leahy Scale: Good Standing balance comment: tolerates head turns, fast/slow gait, and directional changes well                             Pertinent Vitals/Pain Pain Assessment Pain Assessment: No/denies pain    Home Living Family/patient expects to be discharged to:: Private residence Living Arrangements: Spouse/significant other Available Help at Discharge: Family Type of Home: House Home Access: Level entry       Home Layout: One level Home Equipment: [place] - single point Additional Comments: has access to DME through church    Prior Function Prior Level of Function : Independent/Modified Independent;Driving                     Extremity/Trunk Assessment   Upper Extremity Assessment Upper Extremity Assessment: Defer to OT evaluation    Lower Extremity Assessment Lower Extremity Assessment: Overall WFL for tasks  assessed RLE Sensation: WNL    Cervical / Trunk Assessment Cervical / Trunk Assessment: Normal  Communication   Communication Communication: Impaired Factors Affecting Communication: Difficulty expressing self    Cognition Arousal: Alert Behavior During Therapy: WFL for tasks assessed/performed   PT - Cognitive impairments: Awareness                       PT - Cognition Comments: R inattention but will attend with  verbal and tactile cuing Following commands: Impaired Following commands impaired: Follows one step commands with increased time     Cueing       General Comments General comments (skin integrity, edema, etc.): vss    Exercises     Assessment/Plan    PT Assessment Patient does not need any further PT services  PT Problem List Decreased mobility;Decreased activity tolerance;Decreased balance;Decreased knowledge of precautions       PT Treatment Interventions  (n/a)    PT Goals (Current goals can be found in the Care Plan section)  Acute Rehab PT Goals PT Goal Formulation: With patient Time For Goal Achievement: 10/05/23 Potential to Achieve Goals: Good    Frequency  (n/a)     Co-evaluation               AM-PAC PT 6 Clicks Mobility  Outcome Measure Help needed turning from your back to your side while in a flat bed without using bedrails?: None Help needed moving from lying on your back to sitting on the side of a flat bed without using bedrails?: None Help needed moving to and from a bed to a chair (including a wheelchair)?: None Help needed standing up from a chair using your arms (e.g., wheelchair or bedside chair)?: None Help needed to walk in hospital room?: A Little Help needed climbing 3-5 steps with a railing? : A Little 6 Click Score: 22    End of Session   Activity Tolerance: Patient tolerated treatment well Patient left: with call bell/phone within reach;in chair;with chair alarm set Nurse Communication: Mobility status PT Visit Diagnosis: Other abnormalities of gait and mobility (R26.89);Muscle weakness (generalized) (M62.81)    Time: 1130-1150 PT Time Calculation (min) (ACUTE ONLY): 20 min   Charges:   PT Evaluation $PT Eval Low Complexity: 1 Low   PT General Charges $$ ACUTE PT VISIT: 1 Visit         Johana RAMAN, PT DPT Acute Rehabilitation Services Secure Chat Preferred  Office [phone]   Craig Rivera 10/05/2023, 2:24 PM

## 2023-10-05 NOTE — Progress Notes (Addendum)
 STROKE TEAM PROGRESS NOTE    SIGNIFICANT HOSPITAL EVENTS 8/28-patient admitted with worsening aphasia 8/29-extension of left MCA territory stroke noted on MRI  INTERIM HISTORY/SUBJECTIVE He presented with worsening of his aphasia following a recent left MCA stroke on 09/24/2023 with noted left M1 severe stenosis.  CT head showed worsening of subacute evolving left MCA stroke with CT angiogram showing unchanged proximal left M1 occlusion with robust collateralization.  He has been admitted to the ICU for close neurological monitoring and IV heparin  drip. Patient is seen in his room with his wife at the bedside.  He has remained hemodynamically stable and afebrile overnight.  MRI reveals extension of known left MCA stroke.  Patient remains on IV heparin  drip.  OBJECTIVE  CBC    Component Value Date/Time   WBC 4.2 10/05/2023 0544   RBC 3.81 (L) 10/05/2023 0544   HGB 12.9 (L) 10/05/2023 0544   HCT 36.6 (L) 10/05/2023 0544   PLT 110 (L) 10/05/2023 0544   MCV 96.1 10/05/2023 0544   MCH 33.9 10/05/2023 0544   MCHC 35.2 10/05/2023 0544   RDW 14.6 10/05/2023 0544   LYMPHSABS 1.8 10/04/2023 1715   MONOABS 1.0 10/04/2023 1715   EOSABS 0.1 10/04/2023 1715   BASOSABS 0.1 10/04/2023 1715    BMET    Component Value Date/Time   NA 136 10/04/2023 1715   K 3.5 10/04/2023 1715   CL 99 10/04/2023 1715   CO2 26 10/04/2023 1715   GLUCOSE 120 (H) 10/04/2023 1715   BUN 16 10/04/2023 1715   CREATININE 1.26 (H) 10/04/2023 1715   CALCIUM  9.2 10/04/2023 1715   GFRNONAA >60 10/04/2023 1715    IMAGING past 24 hours MR BRAIN WO CONTRAST Result Date: 10/05/2023 CLINICAL DATA:  68 year old male status post scattered small infarcts in the left hemisphere earlier this month. Worsening neurologic deficit now. Left MCA M1 occlusion with reconstitution recently on MRA, CTA. Multiple myeloma. On Eliquis . EXAM: MRI HEAD WITHOUT CONTRAST TECHNIQUE: Multiplanar, multiecho pulse sequences of the brain and  surrounding structures were obtained without intravenous contrast. COMPARISON:  Brain MRI, MRA 09/23/2023. CTA head and neck yesterday. FINDINGS: Brain: Progressive confluence of restricted diffusion now in the posterior left MCA territory, posterior left MCA/PCA watershed area. Increased pre motor cortical involvement (series 2, image 38). But no contralateral or posterior fossa restricted diffusion. No brand new area of involvement. T2 and FLAIR hyperintense cytotoxic edema associated. Petechial hemorrhage in some of the more confluent areas (series 5, image 69). No midline shift or significant intracranial mass effect. Rounded area of FLAIR hyperintense artifacts suspected in the right cerebellum series 4, image 11 (perhaps pulsation from sigmoid venous sinus), not correlated on the remaining sequences. No midline shift, mass effect, evidence of mass lesion, ventriculomegaly, extra-axial collection or acute intracranial hemorrhage. Cervicomedullary junction and pituitary are mildly obscured today, grossly normal. Vascular: Major intracranial vascular flow voids are stable. Skull and upper cervical spine: Stable and negative when allowing for mild motion artifact. Sinuses/Orbits: Stable and negative. Other: Mastoids remain well aerated. Negative visible scalp and face. IMPRESSION: 1. Some extension of posterior Left hemisphere infarcts since the MRI on 09/23/2023. Mild petechial hemorrhage, no malignant hemorrhagic transformation and no significant intracranial mass effect. 2. No new vascular territories involved. Mildly motion degraded exam. No new intracranial abnormality. Electronically Signed   By: VEAR Hurst M.D.   On: 10/05/2023 09:48   CT ANGIO HEAD NECK W WO CM (CODE STROKE) Result Date: 10/04/2023 EXAM: CTA HEAD AND NECK WITH AND WITHOUT  10/04/2023 05:52:58 PM TECHNIQUE: CTA of the head and neck was performed with and without the administration of intravenous contrast. Multiplanar 2D and/or 3D reformatted  images are provided for review. Automated exposure control, iterative reconstruction, and/or weight based adjustment of the mA/kV was utilized to reduce the radiation dose to as low as reasonably achievable. Stenosis of the internal carotid arteries measured using NASCET criteria. COMPARISON: MRA head and neck 09/23/2023 CLINICAL HISTORY: Neuro deficit, acute, stroke suspected. Pt presents with left-side facial drop, slurred speech, 1445 pm, LKW 12 pm. Per wife pt had stroke on 09/23/2023. FINDINGS: CTA NECK: AORTIC ARCH AND ARCH VESSELS: No dissection or arterial injury. No significant stenosis of the brachiocephalic or subclavian arteries. CERVICAL CAROTID ARTERIES: No dissection, arterial injury, or hemodynamically significant stenosis by NASCET criteria. CERVICAL VERTEBRAL ARTERIES: Patent with the left being mildly dominant. No evidence of a stenosis or dissection. Prominent tortuosity of both V2 segments. Incidental left V2 fenestration at C2-3. LUNGS AND MEDIASTINUM: Right jugular port-a-cath. Emphysema. SOFT TISSUES: No acute abnormality. BONES: Focally severe disc degeneration at C6-7. Asymmetrically advanced left-sided facet arthrosis in the upper cervical spine. CTA HEAD: ANTERIOR CIRCULATION: The intracranial internal carotid arteries are patent with mild siphon atherosclerosis but no significant stenosis. There is unchanged occlusion of the proximal left M1 segment with moderate to robust reconstitution of the distal M1 and M2 segments. The right MCA is patent with a mild M1 stenosis. Both ACAs are patent without evidence of a significant proximal stenosis. No aneurysm is identified. POSTERIOR CIRCULATION: The intracranial vertebral arteries are widely patent to the basilar. Patent left PICA, right AICA, and bilateral SCA origins are visualized. The basilar artery is widely patent. There are left larger than right posterior communicating arteries. Both PCAs are patent without evidence of a significant  proximal stenosis. No aneurysm is identified. OTHER: No dural venous sinus thrombosis on this non-dedicated study. The findings were communicated by telephone to Dr. Charlyn on 10/04/2023 at 6:02 pm. IMPRESSION: 1. Unchanged occlusion of the proximal left M1 segment with moderate to robust distal collateralization. 2. Mild right M1 stenosis. 3. Widely patent cervical carotid and vertebral arteries. Electronically signed by: Dasie Hamburg MD 10/04/2023 06:19 PM EDT RP Workstation: [id number]   CT HEAD WO CONTRAST Result Date: 10/04/2023 EXAM: CT HEAD WITHOUT CONTRAST 10/04/2023 05:51:00 PM TECHNIQUE: CT of the head was performed without the administration of intravenous contrast. Automated exposure control, iterative reconstruction, and/or weight based adjustment of the mA/kV was utilized to reduce the radiation dose to as low as reasonably achievable. COMPARISON: Head MRI 09/23/2023 CLINICAL HISTORY: Neuro deficit, acute, stroke suspected. Pt presents with left-side facial drop, slurred speech, 1445 pm, LKW 12 pm. Per wife pt had stroke on 09/23/23. FINDINGS: BRAIN AND VENTRICLES: Multiple small recent left MCA infarcts are again seen. The largest is located near the temporoparietal junction and measures up to 3 cm in size, and patchy acute infarcts were present in this location on the prior MRI although the area of cytotoxic edema appears more confluent/mildly larger on today's CT compared to the prior MRI. No acute infarct is evident in the right cerebral hemisphere. No acute hemorrhage. No mass effect or midline shift. The ventricles are normal in size. ASPECTS: Not scored given the presence of multiple subacute infarcts. ORBITS: Bilateral cataract extraction. SINUSES: No acute abnormality. SOFT TISSUES AND SKULL: Calcified atherosclerosis at the skull base. No acute soft tissue abnormality. No skull fracture. The findings were communicated by telephone to Dr. Charlyn on 10/04/2023 at 6:02 pm.  IMPRESSION: 1.  Evolving subacute left MCA infarcts with the largest in the temporoparietal junction region appearing more confluent/mildly larger than on the prior MRI. No intracranial hemorrhage or contralateral infarct. Electronically signed by: Dasie Hamburg MD 10/04/2023 06:06 PM EDT RP Workstation: [id number]    Vitals:   10/05/23 0740 10/05/23 0800 10/05/23 1000 10/05/23 1200  BP:  (!) 154/92 (!) 159/90 (!) 164/98  Pulse:  (!) 50 (!) 49   Resp:  13 13 11   Temp: 98.2 F (36.8 C)   98.1 F (36.7 C)  TempSrc: Oral   Oral  SpO2:  95% 94%   Weight:      Height:         PHYSICAL EXAM General:  Alert, well-nourished, well-developed patient in no acute distress Psych:  Mood and affect appropriate for situation CV: Regular, bradycardic rate and rhythm on monitor Respiratory:  Regular, unlabored respirations on room air   NEURO:  Mental Status: AA&Ox3, patient is able to give some history history, able to name 4 animals with 4 feet when asked Speech/Language: speech is with mild dysarthria and paucity of speech  Cranial Nerves:  II: PERRL.  Right visual field deficit  likely old III, IV, VI: EOMI. Eyelids elevate symmetrically.  VII: Right facial droop VIII: hearing intact to voice. IX, X: Voice is dysarthric XII: tongue is midline without fasciculations. Motor: Able to move all 4 extremities with good antigravity strength, diminished fine finger movements on the right with slight right grip weakness and heaviness in right leg Tone: is normal and bulk is normal Sensation- Intact to light touch bilaterally. Extinction absent to light touch to DSS.   Coordination: FTN intact bilaterally Gait- deferred  Most Recent NIH  1a Level of Conscious.: 0 1b LOC Questions: 0 1c LOC Commands: 0 2 Best Gaze: 0 3 Visual: 1 4 Facial Palsy: 1 5a Motor Arm - left: 0 5b Motor Arm - Right: 0 6a Motor Leg - Left: 0 6b Motor Leg - Right: 0 7 Limb Ataxia: 0 8 Sensory: 0 9 Best Language: 1 10 Dysarthria:  1 11 Extinct. and Inatten.: 0 TOTAL: 4   ASSESSMENT/PLAN  Craig Rivera is a 68 y.o. male with history of DVT on Eliquis , GERD, hypertension, hyperlipidemia, multiple myeloma and recent left MCA stroke on 8/18 with left MCA stenosis admitted for worsening aphasia and right facial droop.  Patient reports he has not missed any doses of his Eliquis .  NIH on Admission 3  Acute Ischemic Infarct: Extension of left MCA territory stroke Etiology: Left MCA occlusion with failure of collaterals CT head evolving subacute left MCA infarcts with largest in temporoparietal junction region appearing more confluent and larger than on prior MRI CTA head & neck unchanged occlusion of proximal left M1 segment with moderate to robust distal collateralization, mild right M1 stenosis MRI some extension of posterior left hemispheric infarcts with mild petechial hemorrhage 2D Echo EF 55%, grade 1 diastolic dysfunction, mildly dilated left atrium, no atrial level shunt LDL 41 HgbA1c 5.1 VTE prophylaxis -SCDs Eliquis  (apixaban ) daily prior to admission, now on aspirin  81 mg daily, plan to restart Eliquis  over the next couple days and continue aspirin  Therapy recommendations:  Pending Disposition: Pending  Hx of Stroke/TIA Patient had recent left MCA territory stroke on 8/18  Hypertension Home meds: Amlodipine 5 mg daily, hydralazine  25 mg twice daily, losartan-hydrochlorothiazide 50-12.5 mg daily Stable Blood Pressure Goal: BP less than 220/110   Hyperlipidemia Home meds: Atorvastatin  20 mg daily, resumed in  hospital LDL 41, goal < 70 High intensity statin not indicated as LDL below goal Continue statin at discharge  Tobacco Abuse Patient smokes 0.5 packs per day for 40 years Will discuss tobacco cessation  Other Stroke Risk Factors Advanced age   Other Active Problems Multiple myeloma-patient on Decadron , daratumumab  and pomalidomide  at home, hold meds for now and discussed with oncology as  these can be thrombogenic  Hospital day # 1  Patient seen by NP with MD, MD to addend note as needed. Craig Rivera Craig Rivera , MSN, AGACNP-BC [facility] See Amion for schedule and pager information 10/05/2023 1:11 PM   I have personally obtained history,examined this patient, reviewed notes, independently viewed imaging studies, participated in medical decision making and plan of care.ROS completed by me personally and pertinent positives fully documented  I have made any additions or clarifications directly to the above note. Agree with note above.  Patient with recent left MCA branch infarct on 09/24/2023 due to left M1 occlusion presents with worsening of his aphasia due to extension of his infarct.  Recommend post neurological observation in ICU for 24 to 48 hours with IV heparin  drip and close monitoring.  May need to add aspirin  to his Eliquis  to prevent further worsening though it might increase bleeding risk slightly.  Long discussion with patient and wife at the bedside and answered questions.  Mobilize out of bed.  Therapy consults. This patient is critically ill and at significant risk of neurological worsening, death and care requires constant monitoring of vital signs, hemodynamics,respiratory and cardiac monitoring, extensive review of multiple databases, frequent neurological assessment, discussion with family, other specialists and medical decision making of high complexity.I have made any additions or clarifications directly to the above note.This critical care time does not reflect procedure time, or teaching time or supervisory time of PA/NP/Med Resident etc but could involve care discussion time.  I spent 30 minutes of neurocritical care time  in the care of  this patient.      Eather Popp, MD Medical Director [facility] Pager: [phone] 10/05/2023 3:09 PM  To contact Stroke Continuity provider, please refer to [email or web address]. After hours, contact  General Neurology

## 2023-10-05 NOTE — Evaluation (Signed)
 Occupational Therapy Evaluation Patient Details Name: Craig Rivera MRN: [id number] DOB: September 27, 1955 Today's Date: 10/05/2023   History of Present Illness   68 yo male presents to [facility] for L facial droop, slurred speech worse vs baseline. CTH shows subacute evolving L MCA infarcts, CTA with unchanged proximal LMCA M1 occlusion with moderate to robust distal collateralization.Recent hospitalization 8/17 for L MCA CVA with L MCA M1 severe stenosis. PMH: DVT on Xarelto, GERD, HTN, HLD, multiple myeloma     Clinical Impressions Patient evaluated by Occupational Therapy with no further acute OT needs identified. All education has been completed and the patient has no further questions. See below for any follow-up Occupational Therapy or equipment needs. OT to sign off. Thank you for referral.       If plan is discharge home, recommend the following:   Assist for transportation     Functional Status Assessment         Equipment Recommendations   None recommended by OT     Recommendations for Other Services   Speech consult     Precautions/Restrictions   Precautions Precautions: Fall     Mobility Bed Mobility               General bed mobility comments: oob in chair    Transfers       Sit to Stand: Contact guard assist                  Balance             Standing balance-Leahy Scale: Good                             ADL either performed or assessed with clinical judgement   ADL Overall ADL's : Modified independent                                             Vision Baseline Vision/History: 1 Wears glasses Vision Assessment?: Yes Eye Alignment: Within Functional Limits Additional Comments: pt with R inattention noted. pt does scan to the R during visual scanning task without cues. pt just answering and favoring the L visual field.     Perception         Praxis         Pertinent Vitals/Pain Pain  Assessment Pain Assessment: No/denies pain     Extremity/Trunk Assessment Upper Extremity Assessment Upper Extremity Assessment: Overall WFL for tasks assessed   Lower Extremity Assessment Lower Extremity Assessment: Defer to PT evaluation   Cervical / Trunk Assessment Cervical / Trunk Assessment: Normal   Communication Communication Communication: Impaired Factors Affecting Communication: Difficulty expressing self   Cognition Arousal: Alert Behavior During Therapy: WFL for tasks assessed/performed   Difficult to assess due to: Impaired communication           OT - Cognition Comments: pt with word finding deficits but demonstrates 3 step commands. visual scanning task and answering questions appropriate. wife present and report appears to be where he was prior to admission                         Cueing  General Comments      VSS on RA   Exercises     Shoulder Instructions      Home Living  Family/patient expects to be discharged to:: Private residence Living Arrangements: Spouse/significant other Available Help at Discharge: Family Type of Home: House Home Access: Level entry     Home Layout: One level     Bathroom Shower/Tub: Producer, television/film/video: Standard     Home Equipment: Medical laboratory scientific officer - single point   Additional Comments: has access to DME through church  Lives With: Spouse    Prior Functioning/Environment Prior Level of Function : Independent/Modified Independent;Driving                    OT Problem List:     OT Treatment/Interventions:        OT Goals(Current goals can be found in the care plan section)       OT Frequency:       Co-evaluation              AM-PAC OT 6 Clicks Daily Activity     Outcome Measure Help from another person eating meals?: None Help from another person taking care of personal grooming?: None Help from another person toileting, which includes using toliet, bedpan, or  urinal?: None Help from another person bathing (including washing, rinsing, drying)?: None Help from another person to put on and taking off regular upper body clothing?: None Help from another person to put on and taking off regular lower body clothing?: None 6 Click Score: 24   End of Session Equipment Utilized During Treatment: Gait belt Nurse Communication: Mobility status  Activity Tolerance: Patient tolerated treatment well Patient left: in chair;with call bell/phone within reach;with chair alarm set;with nursing/sitter in room  OT Visit Diagnosis: Unsteadiness on feet (R26.81);Other abnormalities of gait and mobility (R26.89);Muscle weakness (generalized) (M62.81);Other symptoms and signs involving cognitive function;Cognitive communication deficit (M58.158)                Time: 8794-8771 OT Time Calculation (min): 23 min Charges:  OT General Charges $OT Visit: 1 Visit OT Evaluation $OT Eval Low Complexity: 1 Low   Brynn, OTR/L  Acute Rehabilitation Services Office: [phone] .   Ely Molt 10/05/2023, 1:05 PM

## 2023-10-05 NOTE — TOC CM/SW Note (Signed)
 Transition of Care [facility]) - Inpatient Brief Assessment   Patient Details  Name: Craig Rivera MRN: [id number] Date of Birth: March 30, 1955  [facility]) CM/SW Contact:    Shannen Vernon M, RN Phone Number: 10/05/2023, 5:05 PM   Clinical Narrative: 68 yo male presents to [facility] for L facial droop, slurred speech worse vs baseline. CTH shows subacute evolving L MCA infarcts, CTA with unchanged proximal LMCA M1 occlusion with moderate to robust distal collateralization. Recent hospitalization 8/17 for L MCA CVA with L MCA M1 severe stenosis.   PT/OT recommending no OP follow up; wife able to provide needed assistance at dc.     Transition of Care Asessment: Insurance and Status: Insurance coverage has been reviewed Patient has primary care physician: Yes Home environment has been reviewed: lives with spouse Prior level of function:: Independent Prior/Current Home Services: No current home services Social Drivers of Health Review: SDOH reviewed no interventions necessary Readmission risk has been reviewed: Yes Transition of care needs: no transition of care needs at this time   Mliss MICAEL Fass, RN, BSN  Trauma/Neuro ICU Case Manager [phone]

## 2023-10-06 ENCOUNTER — Other Ambulatory Visit (HOSPITAL_COMMUNITY): Payer: Self-pay

## 2023-10-06 DIAGNOSIS — R29701 NIHSS score 1: Secondary | ICD-10-CM | POA: Diagnosis not present

## 2023-10-06 DIAGNOSIS — I63512 Cerebral infarction due to unspecified occlusion or stenosis of left middle cerebral artery: Secondary | ICD-10-CM | POA: Diagnosis not present

## 2023-10-06 LAB — CBC
HCT: 38.8 % — ABNORMAL LOW (ref 39.0–52.0)
Hemoglobin: 13.5 g/dL (ref 13.0–17.0)
MCH: 33.8 pg (ref 26.0–34.0)
MCHC: 34.8 g/dL (ref 30.0–36.0)
MCV: 97.2 fL (ref 80.0–100.0)
Platelets: 109 K/uL — ABNORMAL LOW (ref 150–400)
RBC: 3.99 MIL/uL — ABNORMAL LOW (ref 4.22–5.81)
RDW: 14.4 % (ref 11.5–15.5)
WBC: 3.8 K/uL — ABNORMAL LOW (ref 4.0–10.5)
nRBC: 0 % (ref 0.0–0.2)

## 2023-10-06 LAB — APTT
aPTT: 51 s — ABNORMAL HIGH (ref 24–36)
aPTT: 67 s — ABNORMAL HIGH (ref 24–36)

## 2023-10-06 LAB — BASIC METABOLIC PANEL WITH GFR
Anion gap: 11 (ref 5–15)
BUN: 8 mg/dL (ref 8–23)
CO2: 23 mmol/L (ref 22–32)
Calcium: 8.9 mg/dL (ref 8.9–10.3)
Chloride: 102 mmol/L (ref 98–111)
Creatinine, Ser: 0.75 mg/dL (ref 0.61–1.24)
GFR, Estimated: >60 mL/min (ref >60)
Glucose, Bld: 112 mg/dL — ABNORMAL HIGH (ref 70–99)
Potassium: 3.5 mmol/L (ref 3.5–5.1)
Sodium: 136 mmol/L (ref 135–145)

## 2023-10-06 MED ORDER — ASPIRIN 81 MG PO CHEW
81.0000 mg | CHEWABLE_TABLET | Freq: Every day | ORAL | 2 refills | Status: AC
  Filled 2023-10-06: qty 30, 30d supply, fill #0

## 2023-10-06 MED ORDER — APIXABAN 5 MG PO TABS: 5.0000 mg | ORAL_TABLET | Freq: Two times a day (BID) | ORAL | Status: DC

## 2023-10-06 MED ORDER — POTASSIUM CHLORIDE CRYS ER 20 MEQ PO TBCR
40.0000 meq | EXTENDED_RELEASE_TABLET | Freq: Once | ORAL | Status: AC
  Administered 2023-10-06: 40 meq via ORAL
  Filled 2023-10-06: qty 2

## 2023-10-06 NOTE — Progress Notes (Signed)
 Patient to be discharged home with private vehicle and discharge instructions given, with both patient and wife, Jan, in agreement and understanding.   Any questions raised were answered during physician rounds, and at the bedside during discharge teaching. Patient belongings were sent home and accounted for.  Koby Pickup, RN

## 2023-10-06 NOTE — Progress Notes (Signed)
 PHARMACY - ANTICOAGULATION CONSULT NOTE  Pharmacy Consult for heparin  Indication: stroke and h/o VTE  Labs: Recent Labs    10/04/23 1715 10/04/23 1940 10/05/23 0544 10/05/23 1433 10/05/23 2347 10/06/23 0744  HGB 14.3  --  12.9*  --   --  13.5  HCT 41.5  --  36.6*  --   --  38.8*  PLT 136*  --  110*  --   --  109*  APTT 30  --  44* 42* 51* 67*  LABPROT 14.9  --   --   --   --   --   INR 1.1  --   --   --   --   --   HEPARINUNFRC  --  >1.10* >1.10*  --   --   --   CREATININE 1.26*  --   --   --   --  0.75  CKTOTAL 326  --   --   --   --   --    Assessment: 68yo male subtherapeutic on heparin  after rate change; no infusion issues or signs of bleeding per RN.  8/30 AM update: aPTT 67 seconds Hgb wnl PLT 109K No signs of bleeding / pauses w/ gtt  Goal of Therapy:  Heparin  level 0.3-0.5 units/ml aPTT 66-85 seconds   Plan:  Continue heparin  gtt at 1800 units/hr. Check PTT in 6 hours and daily Check HL daily and DC aPTT when correlate Monitor for signs of bleeding / issues w/ gtt  Benedetta Heath BS, PharmD, BCPS Clinical Pharmacist 10/06/2023 8:52 AM  Contact: [phone] after 3 PM

## 2023-10-06 NOTE — Discharge Summary (Addendum)
 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  Fluctuation of BP at home  Secondary diagnosis History of DVT Multiple myeloma on chemo Hypertension Hyperlipidemia Smoker Obesity   Allergies as of 10/06/2023       Reactions   Ceclor [cefaclor] Diarrhea, Rash   Penicillins Diarrhea   Revlimid [lenalidomide] Rash   Sudafed [pseudoephedrine Hcl] Rash        Medication List     STOP taking these medications    amLODipine 5 MG tablet Commonly known as: NORVASC   DAYQUIL PO   hydrALAZINE  25 MG tablet Commonly known as: APRESOLINE    losartan-hydrochlorothiazide 100-12.5 MG tablet Commonly known as: HYZAAR   losartan-hydrochlorothiazide 50-12.5 MG tablet Commonly known as: HYZAAR   NYQUIL PO   potassium chloride  SA 20 MEQ tablet Commonly known as: KLOR-CON  M       TAKE these medications    acetaminophen  500 MG tablet Commonly known as: TYLENOL  Take 1,000 mg by mouth 2 (two) times daily as needed for moderate pain (pain score 4-6) or headache.   apixaban  5 MG Tabs tablet Commonly known as: ELIQUIS  Take 1 tablet (5 mg total) by mouth 2 (two) times daily.   aspirin  81 MG chewable tablet Chew 1 tablet (81 mg total) by mouth daily. Start taking on: October 07, 2023   atorvastatin  20 MG tablet Commonly known as: LIPITOR Take 1 tablet (20 mg total) by mouth daily.   dextromethorphan-guaiFENesin 30-600 MG 12hr tablet Commonly known as: MUCINEX DM Take 1 tablet by mouth every 12 (twelve) hours.   fentaNYL  75 MCG/HR Commonly known as: DURAGESIC  Place 1 patch onto the  skin every 3 (three) days.   levocetirizine 5 MG tablet Commonly known as: XYZAL Take 5 mg by mouth daily.   oxyCODONE  5 MG immediate release tablet Commonly known as: Oxy IR/ROXICODONE  Take 1 tablet (5 mg total) by mouth every 4 (four) hours as needed. pain What changed:  when to take this reasons to take this   pantoprazole  40 MG tablet Commonly known as: PROTONIX  Take 40 mg by mouth daily.   tamsulosin  0.4 MG Caps capsule Commonly known as: FLOMAX  Take 0.4 mg by mouth at bedtime.   valACYclovir 500 MG tablet Commonly known as: VALTREX Take 500 mg by mouth 2 (two) times daily.   venlafaxine  XR 75 MG 24 hr capsule Commonly known as: EFFEXOR -XR Take 75 mg by mouth daily.        LABORATORY STUDIES CBC    Component Value Date/Time   WBC 3.8 (L) 10/06/2023 0744   RBC 3.99 (L) 10/06/2023 0744   HGB 13.5 10/06/2023 0744   HCT 38.8 (L) 10/06/2023 0744   PLT 109 (L) 10/06/2023 0744   MCV 97.2 10/06/2023 0744   MCH 33.8 10/06/2023 0744   MCHC 34.8 10/06/2023 0744   RDW 14.4 10/06/2023 0744   LYMPHSABS 1.8 10/04/2023 1715   MONOABS 1.0 10/04/2023 1715   EOSABS 0.1 10/04/2023 1715   BASOSABS 0.1 10/04/2023 1715   CMP    Component Value Date/Time   NA 136 10/06/2023 0744  K 3.5 10/06/2023 0744   CL 102 10/06/2023 0744   CO2 23 10/06/2023 0744   GLUCOSE 112 (H) 10/06/2023 0744   BUN 8 10/06/2023 0744   CREATININE 0.75 10/06/2023 0744   CALCIUM  8.9 10/06/2023 0744   PROT 6.7 10/04/2023 1715   ALBUMIN 3.8 10/04/2023 1715   AST 13 (L) 10/04/2023 1715   ALT 12 10/04/2023 1715   ALKPHOS 53 10/04/2023 1715   BILITOT 0.8 10/04/2023 1715   GFRNONAA >60 10/06/2023 0744   GFRAA >60 04/12/2017 1039   COAGS Lab Results  Component Value Date   INR 1.1 10/04/2023   INR 1.3 (H) 09/23/2023   Lipid Panel    Component Value Date/Time   CHOL 87 10/05/2023 0544   TRIG 109 10/05/2023 0544   HDL 24 (L) 10/05/2023 0544   CHOLHDL 3.6 10/05/2023 0544   VLDL 22  10/05/2023 0544   LDLCALC 41 10/05/2023 0544   HgbA1C  Lab Results  Component Value Date   HGBA1C 5.1 09/24/2023   Alcohol Level    Component Value Date/Time   [facility] <15 10/04/2023 1715     SIGNIFICANT DIAGNOSTIC STUDIES MR BRAIN WO CONTRAST Result Date: 10/05/2023 CLINICAL DATA:  67 year old male status post scattered small infarcts in the left hemisphere earlier this month. Worsening neurologic deficit now. Left MCA M1 occlusion with reconstitution recently on MRA, CTA. Multiple myeloma. On Eliquis . EXAM: MRI HEAD WITHOUT CONTRAST TECHNIQUE: Multiplanar, multiecho pulse sequences of the brain and surrounding structures were obtained without intravenous contrast. COMPARISON:  Brain MRI, MRA 09/23/2023. CTA head and neck yesterday. FINDINGS: Brain: Progressive confluence of restricted diffusion now in the posterior left MCA territory, posterior left MCA/PCA watershed area. Increased pre motor cortical involvement (series 2, image 38). But no contralateral or posterior fossa restricted diffusion. No brand new area of involvement. T2 and FLAIR hyperintense cytotoxic edema associated. Petechial hemorrhage in some of the more confluent areas (series 5, image 69). No midline shift or significant intracranial mass effect. Rounded area of FLAIR hyperintense artifacts suspected in the right cerebellum series 4, image 11 (perhaps pulsation from sigmoid venous sinus), not correlated on the remaining sequences. No midline shift, mass effect, evidence of mass lesion, ventriculomegaly, extra-axial collection or acute intracranial hemorrhage. Cervicomedullary junction and pituitary are mildly obscured today, grossly normal. Vascular: Major intracranial vascular flow voids are stable. Skull and upper cervical spine: Stable and negative when allowing for mild motion artifact. Sinuses/Orbits: Stable and negative. Other: Mastoids remain well aerated. Negative visible scalp and face. IMPRESSION: 1. Some extension of  posterior Left hemisphere infarcts since the MRI on 09/23/2023. Mild petechial hemorrhage, no malignant hemorrhagic transformation and no significant intracranial mass effect. 2. No new vascular territories involved. Mildly motion degraded exam. No new intracranial abnormality. Electronically Signed   By: VEAR Hurst M.D.   On: 10/05/2023 09:48   CT ANGIO HEAD NECK W WO CM (CODE STROKE) Result Date: 10/04/2023 EXAM: CTA HEAD AND NECK WITH AND WITHOUT 10/04/2023 05:52:58 PM TECHNIQUE: CTA of the head and neck was performed with and without the administration of intravenous contrast. Multiplanar 2D and/or 3D reformatted images are provided for review. Automated exposure control, iterative reconstruction, and/or weight based adjustment of the mA/kV was utilized to reduce the radiation dose to as low as reasonably achievable. Stenosis of the internal carotid arteries measured using NASCET criteria. COMPARISON: MRA head and neck 09/23/2023 CLINICAL HISTORY: Neuro deficit, acute, stroke suspected. Pt presents with left-side facial drop, slurred speech, 1445 pm, LKW 12 pm. Per wife  pt had stroke on 09/23/2023. FINDINGS: CTA NECK: AORTIC ARCH AND ARCH VESSELS: No dissection or arterial injury. No significant stenosis of the brachiocephalic or subclavian arteries. CERVICAL CAROTID ARTERIES: No dissection, arterial injury, or hemodynamically significant stenosis by NASCET criteria. CERVICAL VERTEBRAL ARTERIES: Patent with the left being mildly dominant. No evidence of a stenosis or dissection. Prominent tortuosity of both V2 segments. Incidental left V2 fenestration at C2-3. LUNGS AND MEDIASTINUM: Right jugular port-a-cath. Emphysema. SOFT TISSUES: No acute abnormality. BONES: Focally severe disc degeneration at C6-7. Asymmetrically advanced left-sided facet arthrosis in the upper cervical spine. CTA HEAD: ANTERIOR CIRCULATION: The intracranial internal carotid arteries are patent with mild siphon atherosclerosis but no  significant stenosis. There is unchanged occlusion of the proximal left M1 segment with moderate to robust reconstitution of the distal M1 and M2 segments. The right MCA is patent with a mild M1 stenosis. Both ACAs are patent without evidence of a significant proximal stenosis. No aneurysm is identified. POSTERIOR CIRCULATION: The intracranial vertebral arteries are widely patent to the basilar. Patent left PICA, right AICA, and bilateral SCA origins are visualized. The basilar artery is widely patent. There are left larger than right posterior communicating arteries. Both PCAs are patent without evidence of a significant proximal stenosis. No aneurysm is identified. OTHER: No dural venous sinus thrombosis on this non-dedicated study. The findings were communicated by telephone to Dr. Charlyn on 10/04/2023 at 6:02 pm. IMPRESSION: 1. Unchanged occlusion of the proximal left M1 segment with moderate to robust distal collateralization. 2. Mild right M1 stenosis. 3. Widely patent cervical carotid and vertebral arteries. Electronically signed by: Dasie Hamburg MD 10/04/2023 06:19 PM EDT RP Workstation: [id number]   CT HEAD WO CONTRAST Result Date: 10/04/2023 EXAM: CT HEAD WITHOUT CONTRAST 10/04/2023 05:51:00 PM TECHNIQUE: CT of the head was performed without the administration of intravenous contrast. Automated exposure control, iterative reconstruction, and/or weight based adjustment of the mA/kV was utilized to reduce the radiation dose to as low as reasonably achievable. COMPARISON: Head MRI 09/23/2023 CLINICAL HISTORY: Neuro deficit, acute, stroke suspected. Pt presents with left-side facial drop, slurred speech, 1445 pm, LKW 12 pm. Per wife pt had stroke on 09/23/23. FINDINGS: BRAIN AND VENTRICLES: Multiple small recent left MCA infarcts are again seen. The largest is located near the temporoparietal junction and measures up to 3 cm in size, and patchy acute infarcts were present in this location on the prior MRI  although the area of cytotoxic edema appears more confluent/mildly larger on today's CT compared to the prior MRI. No acute infarct is evident in the right cerebral hemisphere. No acute hemorrhage. No mass effect or midline shift. The ventricles are normal in size. ASPECTS: Not scored given the presence of multiple subacute infarcts. ORBITS: Bilateral cataract extraction. SINUSES: No acute abnormality. SOFT TISSUES AND SKULL: Calcified atherosclerosis at the skull base. No acute soft tissue abnormality. No skull fracture. The findings were communicated by telephone to Dr. Charlyn on 10/04/2023 at 6:02 pm. IMPRESSION: 1. Evolving subacute left MCA infarcts with the largest in the temporoparietal junction region appearing more confluent/mildly larger than on the prior MRI. No intracranial hemorrhage or contralateral infarct. Electronically signed by: Dasie Hamburg MD 10/04/2023 06:06 PM EDT RP Workstation: [id number]   ECHOCARDIOGRAM COMPLETE Result Date: 09/24/2023    ECHOCARDIOGRAM REPORT   Patient Name:   Craig Rivera Date of Exam: 09/24/2023 Medical Rec #:  [id number]     Height:       70.0 in Accession #:    [id number]  Weight:       215.0 lb Date of Birth:  December 21, 1955    BSA:          2.152 m Patient Age:    9 years      BP:           147/93 mmHg Patient Gender: M             HR:           45 bpm. Exam Location:  Inpatient Procedure: 2D Echo, Cardiac Doppler and Color Doppler (Both Spectral and Color            Flow Doppler were utilized during procedure). Indications:    Stroke I63.9  History:        Patient has no prior history of Echocardiogram examinations.                 Risk Factors:Hypertension.  Sonographer:    Tinnie Gosling RDCS Referring Phys: [id number] [facility] IMPRESSIONS  1. Left ventricular ejection fraction, by estimation, is 55%. The left ventricle has normal function. The left ventricle has no regional wall motion abnormalities. There is moderate concentric left ventricular  hypertrophy. Left ventricular diastolic parameters are consistent with Grade I diastolic dysfunction (impaired relaxation).  2. Right ventricular systolic function is normal. The right ventricular size is normal. Tricuspid regurgitation signal is inadequate for assessing PA pressure.  3. Left atrial size was mildly dilated.  4. The mitral valve is normal in structure. No evidence of mitral valve regurgitation. No evidence of mitral stenosis.  5. The aortic valve is tricuspid. Aortic valve regurgitation is not visualized. No aortic stenosis is present.  6. Aortic dilatation noted. There is mild dilatation of the ascending aorta, measuring 40 mm.  7. The inferior vena cava is dilated in size with >50% respiratory variability, suggesting right atrial pressure of 8 mmHg. FINDINGS  Left Ventricle: Left ventricular ejection fraction, by estimation, is 55%. The left ventricle has normal function. The left ventricle has no regional wall motion abnormalities. The left ventricular internal cavity size was normal in size. There is moderate concentric left ventricular hypertrophy. Left ventricular diastolic parameters are consistent with Grade I diastolic dysfunction (impaired relaxation). Right Ventricle: The right ventricular size is normal. No increase in right ventricular wall thickness. Right ventricular systolic function is normal. Tricuspid regurgitation signal is inadequate for assessing PA pressure. Left Atrium: Left atrial size was mildly dilated. Right Atrium: Right atrial size was normal in size. Pericardium: There is no evidence of pericardial effusion. Mitral Valve: The mitral valve is normal in structure. Mild mitral annular calcification. No evidence of mitral valve regurgitation. No evidence of mitral valve stenosis. Tricuspid Valve: The tricuspid valve is normal in structure. Tricuspid valve regurgitation is not demonstrated. Aortic Valve: The aortic valve is tricuspid. Aortic valve regurgitation is not  visualized. No aortic stenosis is present. Pulmonic Valve: The pulmonic valve was normal in structure. Pulmonic valve regurgitation is not visualized. Aorta: The aortic root is normal in size and structure and aortic dilatation noted. There is mild dilatation of the ascending aorta, measuring 40 mm. Venous: The inferior vena cava is dilated in size with greater than 50% respiratory variability, suggesting right atrial pressure of 8 mmHg. IAS/Shunts: No atrial level shunt detected by color flow Doppler.  LEFT VENTRICLE PLAX 2D LVIDd:         5.79 cm   Diastology LVIDs:         3.56 cm   LV e' medial:  6.20 cm/s LV PW:         1.64 cm   LV E/e' medial:  10.3 LV IVS:        1.60 cm   LV e' lateral:   5.87 cm/s LVOT diam:     2.09 cm   LV E/e' lateral: 10.9 LV SV:         102 LV SV Index:   47 LVOT Area:     3.43 cm  RIGHT VENTRICLE             IVC RV S prime:     12.00 cm/s  IVC diam: 2.37 cm TAPSE (M-mode): 2.3 cm LEFT ATRIUM             Index        RIGHT ATRIUM           Index LA diam:        4.62 cm 2.15 cm/m   RA Area:     17.50 cm LA Vol (A2C):   47.6 ml 22.11 ml/m  RA Volume:   47.80 ml  22.21 ml/m LA Vol (A4C):   75.7 ml 35.17 ml/m LA Biplane Vol: 65.8 ml 30.57 ml/m  AORTIC VALVE LVOT Vmax:   132.00 cm/s LVOT Vmean:  83.700 cm/s LVOT VTI:    0.297 m  AORTA Ao Root diam: 3.30 cm Ao Asc diam:  3.99 cm MITRAL VALVE MV Area (PHT): 2.29 cm    SHUNTS MV Decel Time: 332 msec    Systemic VTI:  0.30 m MV E velocity: 63.90 cm/s  Systemic Diam: 2.09 cm MV A velocity: 94.90 cm/s MV E/A ratio:  0.67 Dalton McleanMD Electronically signed by Ezra Kanner Signature Date/Time: 09/24/2023/11:30:43 AM    Final    MR ANGIO HEAD WO CONTRAST Result Date: 09/23/2023 EXAM: MR Angiography Head without intravenous Contrast. 09/23/2023 06:38:51 PM TECHNIQUE: Magnetic resonance angiography images of the head without intravenous contrast. Multiplanar 2D and 3D reformatted images are provided for review. COMPARISON: None  provided. CLINICAL HISTORY: Neuro deficit, acute, stroke suspected. Pt to the ED with complaints of trouble focusing, disorientation, headache, and trouble finding his words. Last known well 1015 today. Pt states he has an uncomfortable feeling in his chest. FINDINGS: ANTERIOR CIRCULATION: The left middle cerebral artery is occluded at its origin. There is moderate collateralization within the anterior division territory but poor collateralization in the posterior left MCA territory. POSTERIOR CIRCULATION: No significant stenosis of the posterior cerebral arteries. No significant stenosis of the basilar artery. No significant stenosis of the vertebral arteries. No aneurysm. IMPRESSION: 1. Left middle cerebral artery occlusion at its origin. 2. Findings discussed with Dr. Zammit at 7:15pm 09/23/23. Electronically signed by: Franky Stanford MD 09/23/2023 07:16 PM EDT RP Workstation: [id number]   MR Angiogram Neck W or Wo Contrast Result Date: 09/23/2023 EXAM: MRA Neck without and with contrast 09/23/2023 06:38:51 PM TECHNIQUE: Multiplanar multisequence MRA of the neck was performed without and with the administration of 10mL intravenous contrast (gadobutrol  (GADAVIST ) 1 MMOL/ML injection 10 mL GADOBUTROL  1 MMOL/ML IV SOLN). 2D and 3D reformatted images are provided for review. Stenosis of the internal carotid arteries is measured using NASCET criteria. COMPARISON: None available CLINICAL HISTORY: Stroke/TIA, determine embolic source. Pt to the ED with complaints of trouble focusing, disorientation, headache, and trouble finding his words. Last known well 1015 today. Pt states he has an uncomfortable feeling in his chest. FINDINGS: CAROTID ARTERIES: No dissection. No hemodynamically significant stenosis by NASCET criteria. VERTEBRAL ARTERIES: No  dissection. No significant stenosis. IMPRESSION: 1. No hemodynamically significant stenosis or dissection of the neck arteries. Electronically signed by: Franky Stanford MD  09/23/2023 07:10 PM EDT RP Workstation: [id number]   MR BRAIN W WO CONTRAST Result Date: 09/23/2023 EXAM: MRI BRAIN WITH AND WITHOUT CONTRAST 09/23/2023 06:38:51 PM TECHNIQUE: Multiplanar multisequence MRI of the head/brain was performed with and without the administration of intravenous contrast. COMPARISON: None available. CLINICAL HISTORY: Mental status change, unknown cause. Pt to the ED with complaints of trouble focusing, disorientation, headache, and trouble finding his words. Last known well 1015 today. Pt states he has an uncomfortable feeling in his chest. FINDINGS: BRAIN AND VENTRICLES: Multifocal acute ischemia within the left temporal and parietal lobes. This is within the posterior left MCA territory and/or the left MCA/PCA watershed zone. Multifocal hyperintense T2-weighted signal within the cerebral white matter, most commonly due to chronic small vessel disease. No acute intracranial hemorrhage. No mass effect or midline shift. No hydrocephalus. The sella is unremarkable. Normal flow voids. No mass or abnormal enhancement. ORBITS: No acute abnormality. SINUSES: No acute abnormality. BONES AND SOFT TISSUES: Normal bone marrow signal and enhancement. No acute soft tissue abnormality. IMPRESSION: 1. Multifocal acute ischemia within the left temporal and parietal lobes. 2. Multifocal hyperintense T2-weighted signal within the cerebral white matter, most commonly due to chronic small vessel disease. Electronically signed by: Franky Stanford MD 09/23/2023 07:04 PM EDT RP Workstation: [id number]       HISTORY OF PRESENT ILLNESS 68 y.o. patient with history of DVT on Eliquis , GERD, hypertension, hyperlipidemia, multiple myeloma and recent left MCA stroke on 8/18 was admitted with worsening aphasia and right facial droop.  HOSPITAL COURSE Patient was found to have an extension of his left MCA territory stroke.  Aphasia did improve while he was in the hospital.  He was maintained on heparin  and then  transitioned back to Eliquis  with an addition of daily aspirin .  He is now stable and ready to be discharged home but will need to monitor blood pressure daily with systolic goal of 869-859.  He was instructed to discuss resumption of blood pressure medications with his PCP if blood pressure is greater than this.  He will need to follow-up in clinic with interventional radiologist in 1 month to discuss possible intracranial stenting.  Patient was also directed to discuss his multiple myeloma medications, Decadron , daratumumab  and pomalidomide  with his oncologist, as the pomalidomide  in particular can be thrombogenic.  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  Fluctuation of BP at home CT head evolving subacute left MCA infarcts with largest in temporoparietal junction region appearing more confluent and larger than on prior MRI CTA head & neck unchanged occlusion of proximal left M1 segment with moderate to robust distal collateralization, mild right M1 stenosis MRI some extension of posterior left hemispheric infarcts with mild petechial hemorrhage 2D Echo EF 55% LDL 81->41 HgbA1c 5.1 VTE prophylaxis -SCDs Eliquis  (apixaban ) daily prior to admission, now on aspirin  81 mg daily and Eliquis  on discharge Therapy recommendations: Outpatient SLP Disposition: Home   Hx of Stroke/TIA Patient had recent left MCA territory stroke on 8/18 due to left MCA occlusion on MRA head and neck.  EF 55%, A1c 5.1, LDL 81, UDS negative.  Discharged on Eliquis  and Lipitor 20.   Hypertension Home meds: Amlodipine 5 mg daily, hydralazine  25 mg twice daily, losartan-hydrochlorothiazide 50-12.5 mg daily Stable without BP meds during hospitalization Avoid  low BP BP monitor at home, hold off BP meds at this time. Long-term blood Pressure Goal 130-140   Hyperlipidemia Home meds: Atorvastatin  20 mg daily, resumed in  hospital LDL 81->41, goal < 70 High intensity statin not indicated as LDL below goal Continue statin at discharge   Tobacco Abuse Patient smokes 0.5 packs per day for 40 years Cessation education provided Patient is willing to quit   Other Stroke Risk Factors Advanced age History of DVT, on Eliquis  now   Other Active Problems Multiple myeloma-patient on Decadron , daratumumab  and pomalidomide  at home, hold meds for now and discussed with oncology as these can be thrombogenic, follow up with his oncologist at [facility] ASAP after discharge     DISCHARGE EXAM  PHYSICAL EXAM General:  Alert, well-nourished, well-developed patient in no acute distress Psych:  Mood and affect appropriate for situation CV: Regular rate and rhythm on monitor Respiratory:  Regular, unlabored respirations on room air GI: Abdomen soft and nontender  NEURO:  Mental Status: AA&Ox3  Speech/Language: speech is without dysarthria or aphasia.  Fluency mildly impaired but naming and repetition intact  Cranial Nerves:  II: PERRL. Visual fields full.  III, IV, VI: EOMI. Eyelids elevate symmetrically.  V: Sensation is intact to light touch and symmetrical to face.  VII: Right facial droop VIII: hearing intact to voice. IX, X: Palate elevates symmetrically. Phonation is normal.  KP:Dynloizm shrug 5/5. XII: tongue is midline without fasciculations. Motor: 5/5 strength to all muscle groups tested.  Tone: is normal and bulk is normal Sensation- Intact to light touch bilaterally. Extinction absent to light touch to DSS. Coordination: FTN with subtle ataxia on the right Gait- deferred  1a Level of Conscious.: 0 1b LOC Questions: 0 1c LOC Commands: 0 2 Best Gaze: 0 3 Visual: 0 4 Facial Palsy: 1 5a Motor Arm - left: 0 5b Motor Arm - Right: 0 6a Motor Leg - Left: 0 6b Motor Leg - Right: 0 7 Limb Ataxia: 0 8 Sensory: 0 9 Best Language: 0 10 Dysarthria: 0 11 Extinct. and Inatten.: 0 TOTAL: 1   Discharge  Diet       Diet   Diet regular Room service appropriate? Yes with Assist; Fluid consistency: Thin   liquids  DISCHARGE PLAN Disposition: Home aspirin  81 mg daily and Eliquis  (apixaban ) daily for secondary stroke prevention  Ongoing stroke risk factor control by Primary Care Physician at time of discharge Follow-up PCP Shona Norleen PEDLAR, MD in 2 weeks. Follow up with interventional radiologist in 4 weeks Follow-up with oncologist to discuss resumption of medications for multiple myeloma Follow-up in [name] in 4-6 weeks, office to schedule an appointment.   35 minutes were spent preparing discharge.  Cortney E Everitt Clint Kill , MSN, AGACNP-BC [facility] See Amion for schedule and pager information 10/06/2023 12:01 PM  ATTENDING NOTE: I reviewed above note and agree with the assessment and plan. Pt was seen and examined.   Wife and RN are at the bedside, pt sitting at the edge of bed, still has mild R facial droop and mile expressive aphasia, but 5/5 in all extremities. Able to name and repeat. On heparin  IV. Will switch back to eliquis  now. Per Dr. Rosemarie will continue the ASA 81, continue statin. PT and OT no recs, will continue outpt speech therapy. Had long discussion about BP monitoring at home and avoid low BP. BP goal 130-140s. Medically ready for discharge. Follow up at [facility].   For detailed assessment  and plan, please refer to above as I have made changes wherever appropriate.   Ary Cummins, MD PhD Stroke Neurology 10/06/2023 12:09 PM

## 2023-10-06 NOTE — Progress Notes (Signed)
 PHARMACY - ANTICOAGULATION CONSULT NOTE  Pharmacy Consult for heparin  Indication: stroke and h/o VTE  Labs: Recent Labs    10/04/23 1715 10/04/23 1940 10/05/23 0544 10/05/23 1433 10/05/23 2347  HGB 14.3  --  12.9*  --   --   HCT 41.5  --  36.6*  --   --   PLT 136*  --  110*  --   --   APTT 30  --  44* 42* 51*  LABPROT 14.9  --   --   --   --   INR 1.1  --   --   --   --   HEPARINUNFRC  --  >1.10* >1.10*  --   --   CREATININE 1.26*  --   --   --   --   CKTOTAL 326  --   --   --   --    Assessment: 68yo male subtherapeutic on heparin  after rate change; no infusion issues or signs of bleeding per RN.  Goal of Therapy:  aPTT 66-85 seconds   Plan:  Increase heparin  infusion by 2-3 units/kg/hr to 1800 units/hr. Check PTT in 6 hours.   Marvetta Dauphin, PharmD, BCPS 10/06/2023 12:37 AM

## 2023-10-06 NOTE — Discharge Instructions (Addendum)
 Craig Rivera, you came to the hospital with worsening speech difficulties and were found to have an extension of your left sided stroke.  You were observed in the ICU and given heparin  for anticoagulation.  You will need to take your Eliquis  at home as well as aspirin  daily.  You will need to monitor your blood pressure daily and keep your systolic (top number) blood pressure around 130-140.  Do not take your blood pressure medications for now.  If your blood pressure is higher than this, discuss resuming your blood pressure medications with your primary care provider.  You will need to see the interventional radiologist in their clinic in 4 weeks to discuss possible stenting and will need to be seen in the stroke clinic in 8 weeks.  You will need to talk with your oncologist about resuming your medications for multiple myeloma.

## 2023-10-09 ENCOUNTER — Other Ambulatory Visit (HOSPITAL_COMMUNITY): Payer: Self-pay

## 2023-10-09 ENCOUNTER — Telehealth: Payer: Self-pay

## 2023-10-09 ENCOUNTER — Encounter (HOSPITAL_COMMUNITY): Admitting: Occupational Therapy

## 2023-10-09 DIAGNOSIS — I69320 Aphasia following cerebral infarction: Secondary | ICD-10-CM | POA: Diagnosis not present

## 2023-10-09 DIAGNOSIS — Z7982 Long term (current) use of aspirin: Secondary | ICD-10-CM | POA: Diagnosis not present

## 2023-10-09 DIAGNOSIS — M545 Low back pain, unspecified: Secondary | ICD-10-CM | POA: Diagnosis not present

## 2023-10-09 DIAGNOSIS — Z87891 Personal history of nicotine dependence: Secondary | ICD-10-CM | POA: Diagnosis not present

## 2023-10-09 DIAGNOSIS — Z79899 Other long term (current) drug therapy: Secondary | ICD-10-CM | POA: Diagnosis not present

## 2023-10-09 DIAGNOSIS — R5383 Other fatigue: Secondary | ICD-10-CM | POA: Diagnosis not present

## 2023-10-09 DIAGNOSIS — Z7901 Long term (current) use of anticoagulants: Secondary | ICD-10-CM | POA: Diagnosis not present

## 2023-10-09 DIAGNOSIS — I69398 Other sequelae of cerebral infarction: Secondary | ICD-10-CM | POA: Diagnosis not present

## 2023-10-09 DIAGNOSIS — Z5111 Encounter for antineoplastic chemotherapy: Secondary | ICD-10-CM | POA: Diagnosis not present

## 2023-10-09 DIAGNOSIS — Z9484 Stem cells transplant status: Secondary | ICD-10-CM | POA: Diagnosis not present

## 2023-10-09 DIAGNOSIS — G252 Other specified forms of tremor: Secondary | ICD-10-CM | POA: Diagnosis not present

## 2023-10-09 DIAGNOSIS — Z9221 Personal history of antineoplastic chemotherapy: Secondary | ICD-10-CM | POA: Diagnosis not present

## 2023-10-09 DIAGNOSIS — Z79891 Long term (current) use of opiate analgesic: Secondary | ICD-10-CM | POA: Diagnosis not present

## 2023-10-09 DIAGNOSIS — C9000 Multiple myeloma not having achieved remission: Secondary | ICD-10-CM | POA: Diagnosis not present

## 2023-10-09 DIAGNOSIS — G8929 Other chronic pain: Secondary | ICD-10-CM | POA: Diagnosis not present

## 2023-10-09 DIAGNOSIS — Z1159 Encounter for screening for other viral diseases: Secondary | ICD-10-CM | POA: Diagnosis not present

## 2023-10-09 NOTE — Transitions of Care (Post Inpatient/ED Visit) (Signed)
 10/09/2023  Name: Craig Rivera MRN: [id number] DOB: 1955/10/08  Today's TOC FU Call Status: Today's TOC FU Call Status:: Successful TOC FU Call Completed TOC FU Call Complete Date: 10/09/23 Patient's Name and Date of Birth confirmed.  Transition Care Management Follow-up Telephone Call Date of Discharge: 10/06/23 Discharge Facility: [facility]) Type of Discharge: Inpatient Admission Primary Inpatient Discharge Diagnosis:: Acute CVA How have you been since you were released from the hospital?: Better Any questions or concerns?: No  Items Reviewed: Did you receive and understand the discharge instructions provided?: Yes Medications obtained,verified, and reconciled?: Yes (Medications Reviewed) Any new allergies since your discharge?: No Dietary orders reviewed?: Yes Type of Diet Ordered:: Low sodium heart healthy Do you have support at home?: Yes People in Home [RPT]: spouse Name of Support/Comfort Primary Source: [name]  Medications Reviewed Today: Medications Reviewed Today     Reviewed by Indonesia Mckeough, RN (Case Manager) on 10/09/23 at 1047  Med List Status: <None>   Medication Order Taking? Sig Documenting Provider Last Dose Status Informant  acetaminophen  (TYLENOL ) 500 MG tablet [id number] Yes Take 1,000 mg by mouth 2 (two) times daily as needed for moderate pain (pain score 4-6) or headache. [provider]  Active Self, Pharmacy Records, Spouse/Significant Other  apixaban  (ELIQUIS ) 5 MG TABS tablet [id number] Yes Take 1 tablet (5 mg total) by mouth 2 (two) times daily. Elodie Palma, MD  Active Self, Pharmacy Records, Spouse/Significant Other  aspirin  81 MG chewable tablet [id number] Yes Chew 1 tablet (81 mg total) by mouth daily. de Clint Kill, Cortney E, NP  Active   atorvastatin  (LIPITOR) 20 MG tablet [id number] Yes Take 1 tablet (20 mg total) by mouth daily. Elodie Palma, MD  Active Self, Pharmacy Records, Spouse/Significant Other   dextromethorphan-guaiFENesin [facility] DM) 30-600 MG 12hr tablet [id number]  Take 1 tablet by mouth every 12 (twelve) hours.  Patient not taking: Reported on 10/09/2023   [provider]  Active Self, Pharmacy Records, Spouse/Significant Other  fentaNYL  (DURAGESIC ) 75 MCG/HR [id number] Yes Place 1 patch onto the skin every 3 (three) days. [provider]  Active Self, Pharmacy Records, Spouse/Significant Other  levocetirizine (XYZAL) 5 MG tablet [id number] Yes Take 5 mg by mouth daily. [provider]  Active Self, Pharmacy Records, Spouse/Significant Other           Med Note (WARD, CHUCK KANDICE Schaumann Oct 04, 2023  6:33 PM)    oxyCODONE  (OXY IR/ROXICODONE ) 5 MG immediate release tablet [id number] Yes Take 1 tablet (5 mg total) by mouth every 4 (four) hours as needed. pain  Patient taking differently: Take 5 mg by mouth every 6 (six) hours as needed for severe pain (pain score 7-10). pain   Letha Truman ORN, NP  Active Self, Pharmacy Records, Spouse/Significant Other           Med Note DRENA, CHUCK KANDICE Schaumann Oct 04, 2023  6:26 PM)    pantoprazole  (PROTONIX ) 40 MG tablet [id number] Yes Take 40 mg by mouth daily. [provider]  Active Self, Pharmacy Records, Spouse/Significant Other  tamsulosin  (FLOMAX ) 0.4 MG CAPS capsule [id number] Yes Take 0.4 mg by mouth at bedtime. [provider]  Active Self, Pharmacy Records, Spouse/Significant Other  valACYclovir (VALTREX) 500 MG tablet [id number] Yes Take 500 mg by mouth 2 (two) times daily. [provider]  Active Self, Pharmacy Records, Spouse/Significant Other  venlafaxine  XR (EFFEXOR -XR) 75 MG 24 hr capsule [id number] Yes Take 75 mg by mouth daily. [provider]  Active Self, Pharmacy Records, Spouse/Significant Other           Med Note (WARD, ANGELICA G   Thu Oct 04, 2023  6:33 PM)              Home Care and Equipment/Supplies: Were Home Health Services Ordered?: No (Outpatient speech  therpay ordered) Any new equipment or medical supplies ordered?: No  Functional Questionnaire: Do you need assistance with bathing/showering or dressing?: No Do you need assistance with meal preparation?: No Do you need assistance with eating?: No Do you have difficulty maintaining continence: No Do you need assistance with getting out of bed/getting out of a chair/moving?: No  Follow up appointments reviewed: PCP Follow-up appointment confirmed?: Yes Date of PCP follow-up appointment?: 10/12/23 Follow-up Provider: Dr. Shona Driscilla Salvage Follow-up appointment confirmed?: Yes Date of Specialist follow-up appointment?: 10/09/23 Follow-Up Specialty Provider:: [facility] oncology Do you need transportation to your follow-up appointment?: No Do you understand care options if your condition(s) worsen?: Yes-patient verbalized understanding  SDOH Interventions Today    Flowsheet Row Most Recent Value  SDOH Interventions   Food Insecurity Interventions Intervention Not Indicated  Housing Interventions Intervention Not Indicated  Transportation Interventions Intervention Not Indicated  Utilities Interventions Intervention Not Indicated    Lindel Marcell J. Arlow Spiers RN, MSN [facility]  [facility], [facility] RN Care Manager Direct Dial: [phone]  Fax: [phone] Website: [email or web address]

## 2023-10-09 NOTE — Patient Instructions (Signed)
 Visit Information  Thank you for taking time to visit with me today. Please don't hesitate to contact me if I can be of assistance to you before our next scheduled telephone appointment.  Our next appointment is by telephone on 10/16/23 at 1000 am  Following is a copy of your care plan:   Goals Addressed             This Visit's Progress    VBCI Transitions of Care (TOC) Care Plan       Problems:  Recent Hospitalization for treatment of Stroke Knowledge Deficit Related to Stroke and Multiple Myeloma  10/09/23 Patient reports he is doing okay but reports wording finding is some worse since recent stroke.  There is some question about cancer treatment causing his strokes.  He has follow up with the cancer per patient to discuss.   Goal:  Over the next 30 days, the patient will not experience hospital readmission  Interventions:  Transitions of Care: Doctor Visits  - discussed the importance of doctor visits Reviewed with patient signs of stroke: Sudden numbness or weakness in the face, arm, or leg, especially on one side of the body. Sudden confusion, trouble speaking, or difficulty understanding speech. Sudden trouble seeing in one or both eyes. Sudden trouble walking, dizziness, loss of balance, or lack of coordination. Sudden severe headache with no known cause. Call 9-1-1 right away if you or someone else has any of these symptoms.  Oncology: Assessment of understanding of oncology diagnosis:  Assessed patient understanding of cancer diagnosis and recommended treatment plan Reviewed upcoming provider appointments and treatment appointments Assessed available transportation to appointments and treatments. Has consistent/reliable transportation: Yes Assessed support system. Has consistent/reliable family or other support: Yes  Patient Self Care Activities:  Call pharmacy for medication refills 3-7 days in advance of running out of medications Call provider office for new  concerns or questions  Notify RN Care Manager of TOC call rescheduling needs Participate in Transition of Care Program/Attend TOC scheduled calls Take medications as prescribed    Plan:  The patient has been provided with contact information for the care management team and has been advised to call with any health related questions or concerns.         Patient verbalizes understanding of instructions and care plan provided today and agrees to view in MyChart. Active MyChart status and patient understanding of how to access instructions and care plan via MyChart confirmed with patient.     The patient has been provided with contact information for the care management team and has been advised to call with any health related questions or concerns.   Please call the care guide team at [phone] if you need to cancel or reschedule your appointment.   Please call the Suicide and Crisis Lifeline: 988 if you are experiencing a Mental Health or Behavioral Health Crisis or need someone to talk to.  Craig Arizpe J. Perline Awe RN, MSN [facility], [facility] RN Care Manager Direct Dial: [phone]  Fax: [phone] Website: [email or web address]

## 2023-10-11 ENCOUNTER — Encounter (HOSPITAL_COMMUNITY): Admitting: Occupational Therapy

## 2023-10-12 DIAGNOSIS — F17210 Nicotine dependence, cigarettes, uncomplicated: Secondary | ICD-10-CM | POA: Diagnosis not present

## 2023-10-12 DIAGNOSIS — Z6830 Body mass index (BMI) 30.0-30.9, adult: Secondary | ICD-10-CM | POA: Diagnosis not present

## 2023-10-12 DIAGNOSIS — Z79899 Other long term (current) drug therapy: Secondary | ICD-10-CM | POA: Diagnosis not present

## 2023-10-12 DIAGNOSIS — C9000 Multiple myeloma not having achieved remission: Secondary | ICD-10-CM | POA: Diagnosis not present

## 2023-10-12 DIAGNOSIS — Z09 Encounter for follow-up examination after completed treatment for conditions other than malignant neoplasm: Secondary | ICD-10-CM | POA: Diagnosis not present

## 2023-10-12 DIAGNOSIS — I639 Cerebral infarction, unspecified: Secondary | ICD-10-CM | POA: Diagnosis not present

## 2023-10-12 DIAGNOSIS — I10 Essential (primary) hypertension: Secondary | ICD-10-CM | POA: Diagnosis not present

## 2023-10-12 DIAGNOSIS — E782 Mixed hyperlipidemia: Secondary | ICD-10-CM | POA: Diagnosis not present

## 2023-10-12 DIAGNOSIS — Z86718 Personal history of other venous thrombosis and embolism: Secondary | ICD-10-CM | POA: Diagnosis not present

## 2023-10-12 DIAGNOSIS — E669 Obesity, unspecified: Secondary | ICD-10-CM | POA: Diagnosis not present

## 2023-10-15 DIAGNOSIS — Z9484 Stem cells transplant status: Secondary | ICD-10-CM | POA: Diagnosis not present

## 2023-10-15 DIAGNOSIS — Z5111 Encounter for antineoplastic chemotherapy: Secondary | ICD-10-CM | POA: Diagnosis not present

## 2023-10-15 DIAGNOSIS — Z1159 Encounter for screening for other viral diseases: Secondary | ICD-10-CM | POA: Diagnosis not present

## 2023-10-15 DIAGNOSIS — C9000 Multiple myeloma not having achieved remission: Secondary | ICD-10-CM | POA: Diagnosis not present

## 2023-10-15 NOTE — Progress Notes (Signed)
 [facility]  Followup Patient Evaluation  Patient Name: Craig Rivera Patient Age: 68 y.o. 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  Rash limiting use of lenalidomide Prior chemotherapy-induced pancytopenia Prior kyphoplasty for management of myeloma-associated compression fractures Pain due to mutiple myeloma  Craig Rivera presents for ongoing second opinion.  We discussed his recent strokes and the possibility that pomalidomide  may have played a role in this.  At his age and with his medical history, he is at risk of CVA regardless of whether or not he is taking pomalidomide .  We discussed that the CVA may not have been driven by pomalidomide .  However, since he has remained stable for 2 years on his current Dara Pom Dex, it is reasonable to discontinue pomalidomide  and maintain on daratumumab  single agent for now.  Will did discuss with him that in the future we may recommend resuming pomalidomide  if serologically he seems to be progressing.  He is here today accompanied by his wife and daughter.  Patient and family understand and agree to current plan.  PLAN  1. Continue Dara-Pd:  ---Daratumumab -hyaluronidase  1800 mg subQ D1,8,15,22 cycles 1-2, D1,15 cycles 3-6 ---Pomalidomide  3 mg PO D1-21 -recommend to hold this for now ---Dexamethasone  40 mg PO D1,8,15,22 Cycle length: 28 days Duration: until progression 2. Prophylaxis: On rivaroxaban unrelated to his myeloma diagnosis which is adequate thromboprevention, continue  valacyclovir while on daratumumab .  Due to recent CVA, he is now also taking a daily 81 mg aspirin  3. Antiresorptive: Not indicated in the absence of bony disease 4. Restaging: As needed for symptoms 5. I will see him back in 6 months for ongoing second opinion.  Craig Plume, PA-C, [place] Physician Assistant Multiple Myeloma and Amyloidosis Program [facility]  Craig HERO. Odella,  MD Clinical Assistant Professor of Medicine [phone] (office) [phone] (fax) [email or web address]   HEMATOLOGICAL / ONCOLOGICAL HISTORY AND HPI  IgG kappa lambda multiple myeloma (MM), ISS stage 3, revised ISS 2, standard-risk genetics        A.  Presentation with blurry vision prompting a chemistry evaluation which revealed a very elevated total protein and an M spike of 5 gm/dL prompting hematology referral.       B. Initial workup with   SPEP/serum IFE 5.04 g/dL IgG kappa   serum free kappa mg/dL, lambda mg/dL, ratio  Beta-2 -microglobulin 5.5 g/dL  Albumin 3.4 g/dL  LDH normal   Bone marrow showed hypercellularity with 68% plasmacytosis.  Cytogenetics normal; FISH with hyperdiploid karyotype      C. Therapy  1a. Vd, C1D1 04/06/2013   Response: Apparent CR  1b. Autologous PBSCT with HD-mel, d0 = 10/24/13  Response: CR. Complicated by pancytopenia, febrile neutropenia, and engraftment syndrome  1c. Maintenance lenalidomide, started 02/2014, complicated by rash resulting in cessation  2. Maintenance bortezomib , received total of two years, was complete by 01/2017  Response: CR  Developed rising M spike again in 07/2017 which accelerated by August 2019 prompting change in therapy  3. Vd, twice weekly bortezomib , 09/14/17 - 11/26/17  Complicated by peripheral neuropathy resulting in a hold in therapy  4. Kd (20/27 with twice weekly carfilzomib ), 09/30/18 -  01/06/19  Response: MR  Therapy changed to escalate response  5. KCyd, 01/17/19-09/25/19  Response: PR  It is unclear from  documentation why his therapy was stopped, however he developed biochemical progression in March 2023 prompting re-referral to the [facility] myeloma clinic and ultimately initiation of Dara-Pd  6. Dara-Pd, 05/31/2021 - present  Best response: PR  Pomalidomide  dose reduced to 3 mg PO D1-21 in April 2024 due to neutropenia  Interval history:  Craig Rivera has remained on Dara-Pd.  He was hospitalized in mid August and then again 2 weeks later with strokes.  His pomalidomide  has been held.  He reports that his main deficit is some slow speech.  He has been set up for speech therapy participation and is getting this twice a week.  He wants to drive, he has been told that he should not get be driving due to his recent stroke.  He did follow-up with his primary care provider last week for evaluation of his hypertension.  His medications were adjusted.  His wife has been checking his blood pressure twice a day.  He has another follow-up appointment with his primary care provider in 2 more weeks.  Denies any new pain.  He did have some increased fatigue, but this is now improved.  He actually was out mowing on the riding mower last week.  He denies weight loss, night sweats, fevers, chills, new pain, lumps/bumps, tongue swelling, shortness of breath, numbness or tingling in the hands or feet, constipation or diarrhea, or urinary changes.   OTHER PAST MEDICAL HISTORY:   Reviewed with changes as noted in interval history and problem list  ALLERGIES:   Adhesive, Amoxicillin, Penicillins, Ceclor [cefaclor], Pseudoephedrine hcl, Revlimid [lenalidomide], and Sudafed cold-allergy  MEDICATIONS:    Reviewed  REVIEW OF SYSTEMS:   GENERAL: No fevers, night sweats, weight gain or loss HEENT: No runny nose, sore throat DERM: No skin rashes, other lesions PULM: No shortness of breath or cough CV: No chest pain or tightness GI: No nausea, vomiting, constipation or diarrhea GU: No dysuria, frothy urine, bloody  urine MSK: No joint pain or swelling NEURO: No numbness or tingling in fingers or toes, weakness.  PSYCH: No anxiety or depression  VITAL SIGNS:   BSA: 2.19 meters squared BP 166/100   Pulse 51   Temp 36.3 C (97.3 F) (Temporal)   Resp 18   Wt 96.6 kg (213 lb)   SpO2 98%   BMI 29.99 kg/m   PHYSICAL EXAM:   ECOG Performance Status: 1  GENERAL:  The patient appears well and in no obvious distress. EYES: EOMI, no scleral icterus.  RC:Mzhlojm rate and rhythm with no murmurs, rubs or gallops. RESP:Clear to auscultation bilaterally. HP:Wnwipduzwizi, nontender with normoactive bowel sounds. There is no organomegaly. MSK: No edema. No palpable abnormalities. LYMPH: No palpable cervical or supraclavicular lymphadenopathy. DERM: No rashes or notable lesions NEURO: A&Ox4 PSYCH: Normal affect  LABS:   WBC  Date Value Ref Range Status  10/09/2023 4.6 4.0 - 10.5 10*9/L Final  06/20/2019 4.1 3.4 - 10.8 x10E3/uL Final   Absolute Neutrophils  Date Value Ref Range Status  10/09/2023 2.8 1.8 - 7.8 10*9/L Final  07/11/2023 1.6 (L) 1.8 - 7.8 10*9/L Final  06/20/2019 2.6 1.4 - 7.0 x10E3/uL Final   Absolute Lymphocytes  Date Value Ref Range Status  07/11/2023 1.4 0.7 - 4.5 10*9/L Final  06/20/2019 1.0 0.7 - 3.1 x10E3/uL Final   HGB  Date Value Ref Range Status  10/09/2023 13.6 12.5 -  17.0 g/dL Final  94/85/7978 86.7 13.0 - 17.7 g/dL Final   HCT  Date Value Ref Range Status  10/09/2023 39.0 36.0 - 50.0 % Final  06/20/2019 38.9 37.5 - 51.0 % Final   MCV  Date Value Ref Range Status  10/09/2023 97.0 80.0 - 98.0 fL Final  06/20/2019 101 (H) 79 - 97 fL Final   Platelet  Date Value Ref Range Status  10/09/2023 152 140 - 415 10*9/L Final  06/20/2019 164 150 - 450 x10E3/uL Final   Sodium  Date Value Ref Range Status  10/09/2023 137 135 - 145 mmol/L Final  06/20/2019 141 134 - 144 mmol/L Final   Potassium  Date Value Ref Range Status  10/09/2023 3.8 3.5 - 5.0 mmol/L  Final  06/20/2019 4.0 3.5 - 5.2 mmol/L Final   Chloride  Date Value Ref Range Status  10/09/2023 100 98 - 107 mmol/L Final  06/20/2019 103 96 - 106 mmol/L Final   CO2  Date Value Ref Range Status  10/09/2023 30.9 21.0 - 32.0 mmol/L Final  06/20/2019 27 20 - 29 mmol/L Final   BUN  Date Value Ref Range Status  10/09/2023 15 8 - 20 mg/dL Final  94/85/7978 16 8 - 27 mg/dL Final   Creatinine  Date Value Ref Range Status  10/09/2023 1.02 0.80 - 1.30 mg/dL Final  94/85/7978 9.00 0.76 - 1.27 mg/dL Final   Glucose  Date Value Ref Range Status  10/09/2023 143 70 - 179 mg/dL Final   Calcium   Date Value Ref Range Status  10/09/2023 9.0 8.5 - 10.1 mg/dL Final  94/85/7978 9.4 8.6 - 10.2 mg/dL Final   Magnesium  Date Value Ref Range Status  10/02/2023 1.9 1.6 - 2.6 mg/dL Final  94/85/7978 2.1 1.6 - 2.3 mg/dL Final   Phosphorus  Date Value Ref Range Status  10/02/2023 2.5 2.4 - 5.1 mg/dL Final  95/94/7978 3.7 2.5 - 4.6 mg/dL Final   Uric Acid  Date Value Ref Range Status  04/27/2020 6.1 3.7 - 9.2 mg/dL Final  95/90/7978 4.7 3.8 - 8.4 mg/dL Final    Comment:               Therapeutic target for gout patients: <6.0   T Albumin  Date Value Ref Range Status  10/02/2023 4.0 3.5 - 5.0 g/dL Final   Albumin  Date Value Ref Range Status  10/09/2023 3.4 (L) 3.5 - 5.0 g/dL Final  95/94/7978 4.0 3.5 - 5.0 g/dL Final   Total Protein  Date Value Ref Range Status  10/09/2023 6.6 6.0 - 8.0 g/dL Final  94/85/7978 6.1 6.0 - 8.5 g/dL Final  95/94/7978 6.8 g/dL Final   Total Bilirubin  Date Value Ref Range Status  10/09/2023 0.5 0.3 - 1.2 mg/dL Final  94/85/7978 0.6 0.0 - 1.2 mg/dL Final   AST  Date Value Ref Range Status  10/09/2023 16 15 - 40 U/L Final  06/20/2019 15 0 - 40 IU/L Final   ALT  Date Value Ref Range Status  10/09/2023 27 12 - 78 U/L Final  06/20/2019 12 0 - 44 IU/L Final   Alkaline Phosphatase  Date Value Ref Range Status  10/09/2023 59 46 - 116 U/L Final   06/20/2019 51 39 - 117 IU/L Final    Comment:    **Effective Jun 23, 2019 Alkaline Phosphatase**   reference interval will be changing to:              Age  Male          Male           0 -  5 days         52 - 127       52 - 127           6 - 10 days         34 - 242       34 - 242          11 - 20 days        114 - 357      114 - 357          21 - 30 days        107 - 494      107 - 494           1 -  2 months      162 - 539      162 - 539           3 -  6 months      141 - 452      141 - 452           7 - 11 months      128 - 401      128 - 401   12 months -  6 years       170 - 369      170 - 369           7 - 12 years       161 - 409      161 - 409               13 years       166 - 435       83 - 227               14 years       121 - 375       68 - 161               15 years        94 - 279       60 - 134               16 years        78 - 207       55 - 121               17 years        67 - 161       50 - 113          18 - 20 years        55 - 125       45 - 106              >20 years        48 - 121       48 - 121    LDH  Date Value Ref Range Status  06/27/2021 208 120 - 246 U/L Final  06/13/2019 205 121 - 224 IU/L Final   Total IgG  Date Value Ref Range Status  10/02/2023 894 650 - 1,600 mg/dL Final  92/69/7974 186 349 - 1,600 mg/dL Final  90/91/7984 371 399 - 1700 mg/dL Final  91/87/7984 339 399 - 1700 mg/dL Final   IgM  Date Value Ref Range Status  10/02/2023 10 (L) 40 - 230 mg/dL Final  92/69/7974 8 (L) 40 - 230 mg/dL Final  90/91/7984 30 (L) 35 - 290 mg/dL Final  91/87/7984 28 (L) 35 - 290 mg/dL Final   IgA  Date Value Ref Range Status  10/02/2023 216.7 70.0 - 400.0 mg/dL Final  92/69/7974 774.4 70.0 - 400.0 mg/dL Final  94/92/7978 42 (L) 61 - 437 mg/dL Final    Comment:    Result confirmed on concentration.  05/16/2019 41 (L) 61 - 437 mg/dL Final    Comment:    Result confirmed on concentration.  07/25/2017 91  Final  07/25/2017  91 61 - 437 mg/dL Final   SPE Interpretation  Date Value Ref Range Status  10/02/2023   Final    Comment:    The SPE pattern demonstrates a slight irregularity in the gamma region, which may represent monoclonal protein. See Immunofixation report.  04/21/2014 SEE COMMENT  Final    Comment:    Decreased total protein. No other abnormalities noted.    Immunofixation Electrophoresis, Serum  Date Value Ref Range Status  10/02/2023   Final    Comment:    No monoclonal protein detected.   07/25/2017 SEE REPORT  Final    Comment:    SEE FULL REPORT SCANNED IN   Kappa Free, Serum  Date Value Ref Range Status  10/02/2023 3.07 (H) 0.33 - 1.94 mg/dL Final  96/84/7983 8.55 0.33 - 1.94 mg/dL Final   Lambda Free, Serum  Date Value Ref Range Status  10/02/2023 2.59 0.57 - 2.63 mg/dL Final  96/84/7983 8.92 0.57 - 2.63 mg/dL Final   K/L FLC Ratio  Date Value Ref Range Status  10/02/2023 1.19 0.26 - 1.65 Final  04/21/2014 1.35 0.26 - 1.65 Final   Immunofixation Electrophoresis, Urine  Date Value Ref Range Status  09/06/2017 Comment  Final    Comment:    Immunofixation shows IgG monoclonal protein with kappa light chain specificity. Bence Jones Protein positive; kappa type.    Hep B S Ab  Date Value Ref Range Status  05/10/2021 Nonreactive Nonreactive, [place] Final   Hep B Core Total Ab  Date Value Ref Range Status  05/10/2021 [facility] Final

## 2023-10-16 ENCOUNTER — Ambulatory Visit (HOSPITAL_COMMUNITY): Attending: Neurology | Admitting: Speech Pathology

## 2023-10-16 ENCOUNTER — Encounter (HOSPITAL_COMMUNITY): Admitting: Occupational Therapy

## 2023-10-16 ENCOUNTER — Other Ambulatory Visit: Payer: Self-pay

## 2023-10-16 ENCOUNTER — Encounter (HOSPITAL_COMMUNITY): Payer: Self-pay | Admitting: Speech Pathology

## 2023-10-16 DIAGNOSIS — I639 Cerebral infarction, unspecified: Secondary | ICD-10-CM | POA: Insufficient documentation

## 2023-10-16 DIAGNOSIS — R4701 Aphasia: Secondary | ICD-10-CM | POA: Diagnosis not present

## 2023-10-16 DIAGNOSIS — R41841 Cognitive communication deficit: Secondary | ICD-10-CM | POA: Diagnosis not present

## 2023-10-16 LAB — I-STAT CHEM 8, ED
BUN: 16 mg/dL (ref 8–23)
Calcium, Ion: 1.18 mmol/L (ref 1.15–1.40)
Chloride: 99 mmol/L (ref 98–111)
Creatinine, Ser: 1.4 mg/dL — ABNORMAL HIGH (ref 0.61–1.24)
Glucose, Bld: 121 mg/dL — ABNORMAL HIGH (ref 70–99)
HCT: 42 % (ref 39.0–52.0)
Hemoglobin: 14.3 g/dL (ref 13.0–17.0)
Potassium: 3.4 mmol/L — ABNORMAL LOW (ref 3.5–5.1)
Sodium: 137 mmol/L (ref 135–145)
TCO2: 25 mmol/L (ref 22–32)

## 2023-10-16 NOTE — Therapy (Signed)
 OUTPATIENT SPEECH LANGUAGE PATHOLOGY TREATMENT   Patient Name: Craig Rivera MRN: [id number] DOB:1955/08/14, 68 y.o., male Today's Date: 10/16/2023  PCP: Shona Norleen PEDLAR, MD REFERRING PROVIDER: Jerri Pfeiffer, MD  END OF SESSION:  End of Session - 10/16/23 1406     Visit Number 4    Number of Visits 9    Date for SLP Re-Evaluation 11/06/23    Authorization Type [employer]   eff:02/07/23 oop: copay 5 auth no sw jacquline ref # [id number]   SLP Start Time 1345    SLP Stop Time  1430    SLP Time Calculation (min) 45 min    Activity Tolerance Patient tolerated treatment well          Past Medical History:  Diagnosis Date   Back pain    DVT (deep venous thrombosis) (HCC)    GERD (gastroesophageal reflux disease)    HTN (hypertension)    Hypercholesterolemia    Multiple myeloma (HCC)    Sciatic nerve pain    Past Surgical History:  Procedure Laterality Date   BONE MARROW TRANSPLANT     CATARACT EXTRACTION W/PHACO Right 08/11/2014   Procedure: CATARACT EXTRACTION PHACO AND INTRAOCULAR LENS PLACEMENT (IOC);  Surgeon: Dow JULIANNA Burke, MD;  Location: [facility];  Service: Ophthalmology;  Laterality: Right;  CDE 4.88   CATARACT EXTRACTION W/PHACO Left 10/26/2014   Procedure: CATARACT EXTRACTION PHACO AND INTRAOCULAR LENS PLACEMENT LEFT EYE CDE=8.64;  Surgeon: Dow JULIANNA Burke, MD;  Location: [facility];  Service: Ophthalmology;  Laterality: Left;   COLONOSCOPY  09/12/2010   Procedure: COLONOSCOPY;  Surgeon: Lamar CHRISTELLA Hollingshead, MD;  Location: [facility];  Service: Endoscopy;  Laterality: N/A;   COLONOSCOPY WITH PROPOFOL  N/A 01/06/2022   Procedure: COLONOSCOPY WITH PROPOFOL ;  Surgeon: Cindie Carlin POUR, DO;  Location: [facility];  Service: Endoscopy;  Laterality: N/A;  10:00am   inguinal hernia repair bilateral     as child    KNEE SURGERY Right    arthroscopy   POLYPECTOMY  01/06/2022   Procedure: POLYPECTOMY;  Surgeon: Cindie Carlin POUR, DO;  Location: [facility];  Service:  Endoscopy;;   TONSILLECTOMY     Patient Active Problem List   Diagnosis Date Noted   Acute CVA (cerebrovascular accident) (HCC) 10/04/2023   Acute ischemic left middle cerebral artery (MCA) stroke (HCC) 10/04/2023   Weakness 10/04/2023   Acute ischemic left MCA stroke (HCC) 09/23/2023   Positive colorectal cancer screening using Cologuard test 12/09/2021   Hypokalemia 11/09/2015   Multiple myeloma (HCC) 07/22/2015   Encounter for screening colonoscopy 08/30/2010    ONSET DATE: 09/23/2023   REFERRING DIAG: I63.9 (ICD-10-CM) - Stroke, acute, thrombotic (HCC)  THERAPY DIAG:  Aphasia  Cognitive communication deficit  Rationale for Evaluation and Treatment: Rehabilitation  SUBJECTIVE:   SUBJECTIVE STATEMENT: The writing the part was difficult.  Pt accompanied by: self and significant other  PERTINENT HISTORY: Patient is a 68 y.o. male with PMH: HTN, HLD, multiple myeloma on opioids who presented to the hospital on 09/23/23 with mild apasia. His wife drove him to the ED. MRI of brain showed Multifocal acute ischemia within the left temporal and parietal lobes. Pt was hospitalized 8/17-8/19/25 and discharged home with his wife. He was referred for outpatient SLP therapy by Dr. Pfeiffer Jerri.  PAIN:  Are you having pain? No  FALLS: Has patient fallen in last 6 months?  No  LIVING ENVIRONMENT: Lives with: lives with their spouse Lives in: House/apartment  PLOF:  Level  of assistance: Independent with ADLs, Independent with IADLs Employment: Retired  PATIENT GOALS: Improve speech  OBJECTIVE:  Note: Objective measures were completed at Evaluation unless otherwise noted.  DIAGNOSTIC FINDINGS:   MRI BRAIN WITH AND WITHOUT CONTRASTIMPRESSION: 1. Multifocal acute ischemia within the left temporal and parietal lobes. 2. Multifocal hyperintense T2-weighted signal within the cerebral white matter, most commonly due to chronic small vessel disease.   Electronically signed by:  Franky Stanford MD 09/23/2023                                                                                                                   Previous Treatment: Pt arrived unaccompanied to therapy today as his wife is at work. SLP noted immediately that Pt had more difficulty communicating today, however he denies any other symptoms (slight headache). Pt without facial asymmetry and no dysarthria- just very delayed responses and increased difficulty with word finding. He stated that he mulched the yard yesterday and it took 3-4 hours, but he rested periodically. He then went to visit a friend in the hospital with his wife. Pt only had his stroke 10 days ago and SLP reinforced the importance of rest, hydration, and food for his recovery. It appears that he overdid it yesterday and is experiencing fatigue and therefore increased dysnomia today. SLP facilitated completion of his communication support template, by adding: bill paying, medications, hobbies, medical professionals, and other important medical information. Pt had trouble verbalizing his phone number and address today, but benefited from verbal cues. He was able to read list of friends with indirect cues and expand on their relationships. Pt will return in an hour for his OT evaluation. HEP provided.  TREATMENT DATE: 10/16/23  Unfortunately, Pt had another stroke on 10/04/2023 with exacerbation of his expressive aphasia. See hospitalization notes. His wife noted facial droop on 10/04/23 and took him to the hospital. His blood pressure has been elevated and MD is aware per wife. He completed HEP by sorting words into accurate categories with 100% acc with word bank provided (he reported some fine motor difficulties with this). In session, he was asked to verbalize divergent naming task and named 7 dog breeds with min cues. He was encouraged to use word finding strategies to convey his thoughts when unable to name (describe by size, color, personal  association). He named high frequency words with 100% acc and answered personally relevant Wh questions with 90% acc with min cues. Additional HEP provided and continue to address expressive language deficits next session.    PATIENT EDUCATION: Education details: Divergent naming with word bank  Person educated: Patient and Spouse Education method: Explanation, Demonstration, and Handouts Education comprehension: verbalized understanding   GOALS: Goals reviewed with patient? Yes  SHORT TERM GOALS: Target date: 11/01/2023  Pt will implement word-finding strategies with 90% accuracy when unable to verbalize desired word in conversation/functional tasks with min assist. Baseline: 70% Goal status: ONGOING  2.  Pt will describe objects and pictures by providing at least three salient features  as judged by clinician with 90% acc when provided min cues.  Baseline: 75% Goal status: ONGOING  3.  Pt will complete moderately complex abstract divergent naming tasks with 90% acc with min cues.  Baseline: mod cues Goal status: ONGOING  4.  Pt will complete high level verbal expression tasks (object description, state function, provide short summary, etc) using short sentences with 90% acc and min assist. Baseline: 70% Goal status: ONGOING  5.  Pt will respond to open-ended questions using a complete sentence with 80% acc and min cues. Baseline:  Goal status: ONGOING  6.  Pt will follow multistep auditory directions with 90% acc when provided min cues. Baseline: mod cues and repetition required Goal status: ONGOING  LONG TERM GOALS: Same as short term goals   ASSESSMENT:  CLINICAL IMPRESSION: (initial evaluation 09/27/23) Patient is a 68 y.o. male who was seen today for a cognitive linguistic evaluation. Pt presents with mild/mod expressive language deficits and mild receptive language deficits which negatively impacts executive functioning. Pt primarily presents with dysnomia and  reduced length of utterances for answering open ended questions. He had difficulty listing hobbies, restaurants he frequents, and other important names. He was able to follow 2-step directions, but breakdowns occurred with lengthier/complex auditory directions. 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 He is motivated to improve his language skills and has excellent family support.   OBJECTIVE IMPAIRMENTS: include expressive language, receptive language, and aphasia. These impairments are limiting patient from effectively communicating at home and in community. Factors affecting potential to achieve goals and functional outcome are previous level of function and family/community support. Patient will benefit from skilled SLP services to address above impairments and improve overall function.  REHAB POTENTIAL: Excellent  PLAN:  SLP FREQUENCY: 2x/week  SLP DURATION: 4 weeks  PLANNED INTERVENTIONS: Cueing hierachy, Internal/external aids, Functional tasks, Multimodal communication approach, SLP instruction and feedback, Compensatory strategies, Patient/family education, [phone] Treatment of speech (30 or 45 min) , and [id number]- Speech [address], Artic, Phon, Eval Compre, Express   Thank you,  Lamar Candy, CCC-SLP [phone]  Neko Mcgeehan, CCC-SLP 10/16/2023, 2:09 PM

## 2023-10-18 ENCOUNTER — Ambulatory Visit (HOSPITAL_COMMUNITY): Admitting: Speech Pathology

## 2023-10-18 ENCOUNTER — Encounter: Payer: Self-pay | Admitting: Neuroradiology

## 2023-10-18 ENCOUNTER — Encounter (HOSPITAL_COMMUNITY): Admitting: Occupational Therapy

## 2023-10-18 ENCOUNTER — Ambulatory Visit: Admitting: Neuroradiology

## 2023-10-18 ENCOUNTER — Encounter (HOSPITAL_COMMUNITY): Payer: Self-pay | Admitting: Speech Pathology

## 2023-10-18 ENCOUNTER — Telehealth: Payer: Self-pay

## 2023-10-18 VITALS — BP 152/107 | HR 60 | Ht 70.0 in | Wt 212.0 lb

## 2023-10-18 DIAGNOSIS — R4701 Aphasia: Secondary | ICD-10-CM

## 2023-10-18 DIAGNOSIS — I672 Cerebral atherosclerosis: Secondary | ICD-10-CM | POA: Diagnosis not present

## 2023-10-18 DIAGNOSIS — R41841 Cognitive communication deficit: Secondary | ICD-10-CM

## 2023-10-18 NOTE — Transitions of Care (Post Inpatient/ED Visit) (Signed)
 Transition of Care week 2  Visit Note  10/18/2023  Name: Craig Rivera MRN: [id number]          DOB: Oct 29, 1955  Situation: Patient enrolled in [name] 30-day program. Visit completed with patient by telephone.   Background:   Initial Transition Care Management Follow-up Telephone Call    Past Medical History:  Diagnosis Date   Back pain    DVT (deep venous thrombosis) (HCC)    GERD (gastroesophageal reflux disease)    HTN (hypertension)    Hypercholesterolemia    Multiple myeloma (HCC)    Sciatic nerve pain     Assessment: Patient Reported Symptoms: Cognitive Cognitive Status: Alert and oriented to person, place, and time, Normal speech and language skills      Neurological Neurological Review of Symptoms: Other: [place] Neurological Symptoms/Conditions [RPT]: Recent stroke- continues wording finding.  Working with outpatient ST. Oncology has discontinued pomalidomide  and will do Dara Pom Dex for now Neurological Management Strategies: Medication therapy, Routine screening Neurological Self-Management Outcome: 3 (uncertain) Neurological Comment: Reviewed Stroke Symptoms:  Sudden numbness or weakness in the face, arm, or leg, especially on one side of the body.   Sudden confusion, trouble speaking, or difficulty understanding speech.   Sudden trouble seeing in one or both eyes.   Sudden trouble walking, dizziness, loss of balance, or lack of coordination.   Sudden severe headache with no known cause.  Call 9-1-1 right away if you or someone else has any of these symptoms.  HEENT HEENT Symptoms Reported: No symptoms reported      Cardiovascular Cardiovascular Symptoms Reported: No symptoms reported    Respiratory Respiratory Symptoms Reported: No symptoms reported    Endocrine Endocrine Symptoms Reported: No symptoms reported    Gastrointestinal Gastrointestinal Symptoms Reported: No symptoms reported      Genitourinary Genitourinary Symptoms Reported: No symptoms reported     Integumentary Integumentary Symptoms Reported: No symptoms reported    Musculoskeletal Musculoskelatal Symptoms Reviewed: No symptoms reported   Falls in the past year?: No    Psychosocial Psychosocial Symptoms Reported: Sadness - if selected complete PHQ 2-9 Behavioral Management Strategies: Activity, Support system Behavioral Health Self-Management Outcome: 4 (good) Major Change/Loss/Stressor/Fears (CP): Medical condition, self Techniques to Cope with Loss/Stress/Change: Diversional activities Quality of Family Relationships: supportive Do you feel physically threatened by others?: No   There were no vitals filed for this visit.  Medications Reviewed Today     Reviewed by Juanjose Mojica, RN (Case Manager) on 10/18/23 at 1410  Med List Status: <None>   Medication Order Taking? Sig Documenting Provider Last Dose Status Informant  acetaminophen  (TYLENOL ) 500 MG tablet [id number]  Take 1,000 mg by mouth 2 (two) times daily as needed for moderate pain (pain score 4-6) or headache. [provider]  Active Self, Pharmacy Records, Spouse/Significant Other  apixaban  (ELIQUIS ) 5 MG TABS tablet [id number] Yes Take 1 tablet (5 mg total) by mouth 2 (two) times daily. Elodie Palma, MD  Active Self, Pharmacy Records, Spouse/Significant Other  aspirin  81 MG chewable tablet [id number] Yes Chew 1 tablet (81 mg total) by mouth daily. de Clint Kill, Cortney E, NP  Active   atorvastatin  (LIPITOR) 20 MG tablet [id number] Yes Take 1 tablet (20 mg total) by mouth daily. Elodie Palma, MD  Active Self, Pharmacy Records, Spouse/Significant Other  dextromethorphan-guaiFENesin [facility] DM) 30-600 MG 12hr tablet [id number]  Take 1 tablet by mouth every 12 (twelve) hours. [provider]  Active Self, Pharmacy Records, Spouse/Significant Other  fentaNYL  (DURAGESIC ) 75 MCG/HR  [id number] Yes Place 1 patch onto the skin every 3 (three) days. [provider]  Active Self, Pharmacy Records,  Spouse/Significant Other  levocetirizine (XYZAL) 5 MG tablet [id number] Yes Take 5 mg by mouth daily. [provider]  Active Self, Pharmacy Records, Spouse/Significant Other           Med Note (WARD, CHUCK KANDICE Schaumann Oct 04, 2023  6:33 PM)    oxyCODONE  (OXY IR/ROXICODONE ) 5 MG immediate release tablet [id number] Yes Take 1 tablet (5 mg total) by mouth every 4 (four) hours as needed. pain  Patient taking differently: Take 5 mg by mouth every 6 (six) hours as needed for severe pain (pain score 7-10). pain   Letha Truman ORN, NP  Active Self, Pharmacy Records, Spouse/Significant Other           Med Note DRENA, CHUCK KANDICE Schaumann Oct 04, 2023  6:26 PM)    pantoprazole  (PROTONIX ) 40 MG tablet [id number] Yes Take 40 mg by mouth daily. [provider]  Active Self, Pharmacy Records, Spouse/Significant Other  tamsulosin  (FLOMAX ) 0.4 MG CAPS capsule [id number] Yes Take 0.4 mg by mouth at bedtime. [provider]  Active Self, Pharmacy Records, Spouse/Significant Other  valACYclovir (VALTREX) 500 MG tablet [id number] Yes Take 500 mg by mouth 2 (two) times daily. [provider]  Active Self, Pharmacy Records, Spouse/Significant Other  venlafaxine  XR (EFFEXOR -XR) 75 MG 24 hr capsule [id number] Yes Take 75 mg by mouth daily. [provider]  Active Self, Pharmacy Records, Spouse/Significant Other           Med Note DRENA, CHUCK KANDICE Schaumann Oct 04, 2023  6:33 PM)              Goals Addressed             This Visit's Progress    VBCI Transitions of Care (TOC) Care Plan       Problems:  Recent Hospitalization for treatment of Stroke Knowledge Deficit Related to Stroke and Multiple Myeloma  10/18/23 Patient reports he is doing better.  He continues to word find during conversation but able to verbalize thoughts.  He states there was change to medication for his myeloma due to question of medication causing his recent stroke.    Myeloma treatment with Dara Pom  Dex. Goal:  Over the next 30 days, the patient will not experience hospital readmission  Interventions:  Transitions of Care: Doctor Visits  - discussed the importance of doctor visits Reiterated with patient signs of stroke: Sudden numbness or weakness in the face, arm, or leg, especially on one side of the body. Sudden confusion, trouble speaking, or difficulty understanding speech. Sudden trouble seeing in one or both eyes. Sudden trouble walking, dizziness, loss of balance, or lack of coordination. Sudden severe headache with no known cause. Call 9-1-1 right away if you or someone else has any of these symptoms.  Oncology: Assessment of understanding of oncology diagnosis:  Assessed patient understanding of cancer diagnosis and recommended treatment plan Reviewed upcoming provider appointments and treatment appointments Assessed available transportation to appointments and treatments. Has consistent/reliable transportation: Yes Assessed support system. Has consistent/reliable family or other support: Yes  Patient Self Care Activities:  Call pharmacy for medication refills 3-7 days in advance of running out of medications Call provider office for new concerns or questions  Notify RN Care Manager of TOC call rescheduling needs Participate in Transition of Care Program/Attend TOC scheduled calls Take medications as prescribed  Plan:  The patient has been provided with contact information for the care management team and has been advised to call with any health related questions or concerns.         Recommendation:   Continue Current Plan of Care  Follow Up Plan:   Telephone follow-up in 1 week  Efrat Zuidema J. Eliot Bencivenga RN, MSN [facility], [facility] RN Care Manager Direct Dial: [phone]  Fax: [phone] Website: [email or web address]

## 2023-10-18 NOTE — Telephone Encounter (Signed)
 0700 a.m. = wife called to state the patient is having recurring horrible headaches and has poor control of blood pressure.  He wants to resume his prior hypertension medication with losartan.  His highest pressure yesterday was 188/103.  He is not acting like himself and his easily irritated and has been negatively affected by the pain from the headaches.  He is not taking any medication for the headaches.  She is expressing concern over risk for additional stroke.  I let her know I will call neurology and see if they can see him any sooner than the end of this month.  0900 AM-I spoke with Dr. Wendall office with [facility] neurology and they will work the patient in to be seen at 3 PM this afternoon at [facility] neurology [address] [employer]. [address] I called and left a voicemail with this information on both the patient's cell phone and the wife cell phone

## 2023-10-18 NOTE — Patient Instructions (Signed)
 Visit Information  Thank you for taking time to visit with me today. Please don't hesitate to contact me if I can be of assistance to you before our next scheduled telephone appointment.  Our next appointment is by telephone on 10/24/23 at 1000 am  Following is a copy of your care plan:   Goals Addressed             This Visit's Progress    VBCI Transitions of Care (TOC) Care Plan       Problems:  Recent Hospitalization for treatment of Stroke Knowledge Deficit Related to Stroke and Multiple Myeloma  10/18/23 Patient reports he is doing better.  He continues to word find during conversation but able to verbalize thoughts.  He states there was change to medication for his myeloma due to question of medication causing his recent stroke.    Myeloma treatment with Dara Pom Dex. Goal:  Over the next 30 days, the patient will not experience hospital readmission  Interventions:  Transitions of Care: Doctor Visits  - discussed the importance of doctor visits Reiterated with patient signs of stroke: Sudden numbness or weakness in the face, arm, or leg, especially on one side of the body. Sudden confusion, trouble speaking, or difficulty understanding speech. Sudden trouble seeing in one or both eyes. Sudden trouble walking, dizziness, loss of balance, or lack of coordination. Sudden severe headache with no known cause. Call 9-1-1 right away if you or someone else has any of these symptoms.  Oncology: Assessment of understanding of oncology diagnosis:  Assessed patient understanding of cancer diagnosis and recommended treatment plan Reviewed upcoming provider appointments and treatment appointments Assessed available transportation to appointments and treatments. Has consistent/reliable transportation: Yes Assessed support system. Has consistent/reliable family or other support: Yes  Patient Self Care Activities:  Call pharmacy for medication refills 3-7 days in advance of running out  of medications Call provider office for new concerns or questions  Notify RN Care Manager of TOC call rescheduling needs Participate in Transition of Care Program/Attend TOC scheduled calls Take medications as prescribed    Plan:  The patient has been provided with contact information for the care management team and has been advised to call with any health related questions or concerns.         Patient verbalizes understanding of instructions and care plan provided today and agrees to view in MyChart. Active MyChart status and patient understanding of how to access instructions and care plan via MyChart confirmed with patient.     The patient has been provided with contact information for the care management team and has been advised to call with any health related questions or concerns.   Please call the care guide team at [phone] if you need to cancel or reschedule your appointment.   Please call the Suicide and Crisis Lifeline: 988 if you are experiencing a Mental Health or Behavioral Health Crisis or need someone to talk to.  Gustaf Mccarter J. Madelene Kaatz RN, MSN [facility], [facility] RN Care Manager Direct Dial: [phone]  Fax: [phone] Website: [email or web address]

## 2023-10-18 NOTE — Therapy (Signed)
 OUTPATIENT SPEECH LANGUAGE PATHOLOGY TREATMENT   Patient Name: Craig Rivera MRN: [id number] DOB:Jan 16, 1956, 68 y.o., male Today's Date: 10/18/2023  PCP: Craig Norleen PEDLAR, MD REFERRING PROVIDER: Jerri Pfeiffer, MD  END OF SESSION:  End of Session - 10/18/23 1017     Visit Number 5    Number of Visits 9    Date for SLP Re-Evaluation 11/06/23    Authorization Type [employer]   eff:02/07/23 oop: copay 5 auth no sw jacquline ref # [id number]   SLP Start Time 0930    SLP Stop Time  1015    SLP Time Calculation (min) 45 min    Activity Tolerance Patient tolerated treatment well          Past Medical History:  Diagnosis Date   Back pain    DVT (deep venous thrombosis) (HCC)    GERD (gastroesophageal reflux disease)    HTN (hypertension)    Hypercholesterolemia    Multiple myeloma (HCC)    Sciatic nerve pain    Past Surgical History:  Procedure Laterality Date   BONE MARROW TRANSPLANT     CATARACT EXTRACTION W/PHACO Right 08/11/2014   Procedure: CATARACT EXTRACTION PHACO AND INTRAOCULAR LENS PLACEMENT (IOC);  Surgeon: Craig JULIANNA Burke, MD;  Location: [facility];  Service: Ophthalmology;  Laterality: Right;  CDE 4.88   CATARACT EXTRACTION W/PHACO Left 10/26/2014   Procedure: CATARACT EXTRACTION PHACO AND INTRAOCULAR LENS PLACEMENT LEFT EYE CDE=8.64;  Surgeon: Craig JULIANNA Burke, MD;  Location: [facility];  Service: Ophthalmology;  Laterality: Left;   COLONOSCOPY  09/12/2010   Procedure: COLONOSCOPY;  Surgeon: Craig CHRISTELLA Hollingshead, MD;  Location: [facility];  Service: Endoscopy;  Laterality: N/A;   COLONOSCOPY WITH PROPOFOL  N/A 01/06/2022   Procedure: COLONOSCOPY WITH PROPOFOL ;  Surgeon: Craig Carlin POUR, DO;  Location: [facility];  Service: Endoscopy;  Laterality: N/A;  10:00am   inguinal hernia repair bilateral     as child    KNEE SURGERY Right    arthroscopy   POLYPECTOMY  01/06/2022   Procedure: POLYPECTOMY;  Surgeon: Craig Carlin POUR, DO;  Location: [facility];  Service:  Endoscopy;;   TONSILLECTOMY     Patient Active Problem List   Diagnosis Date Noted   Acute CVA (cerebrovascular accident) (HCC) 10/04/2023   Acute ischemic left middle cerebral artery (MCA) stroke (HCC) 10/04/2023   Weakness 10/04/2023   Acute ischemic left MCA stroke (HCC) 09/23/2023   Positive colorectal cancer screening using Cologuard test 12/09/2021   Hypokalemia 11/09/2015   Multiple myeloma (HCC) 07/22/2015   Encounter for screening colonoscopy 08/30/2010    ONSET DATE: 09/23/2023   REFERRING DIAG: I63.9 (ICD-10-CM) - Stroke, acute, thrombotic (HCC)  THERAPY DIAG:  Aphasia  Cognitive communication deficit  Rationale for Evaluation and Treatment: Rehabilitation  SUBJECTIVE:   SUBJECTIVE STATEMENT: [name] stepping in when she shouldn't.  Pt accompanied by: self and significant other  PERTINENT HISTORY: Patient is a 68 y.o. male with PMH: HTN, HLD, multiple myeloma on opioids who presented to the hospital on 09/23/23 with mild apasia. His wife drove him to the ED. MRI of brain showed Multifocal acute ischemia within the left temporal and parietal lobes. Pt was hospitalized 8/17-8/19/25 and discharged home with his wife. He was referred for outpatient SLP therapy by Dr. Pfeiffer Craig.  PAIN:  Are you having pain? No  FALLS: Has patient fallen in last 6 months?  No  LIVING ENVIRONMENT: Lives with: lives with their spouse Lives in: House/apartment  PLOF:  Level  of assistance: Independent with ADLs, Independent with IADLs Employment: Retired  PATIENT GOALS: Improve speech  OBJECTIVE:  Note: Objective measures were completed at Evaluation unless otherwise noted.  DIAGNOSTIC FINDINGS:   MRI BRAIN WITH AND WITHOUT CONTRASTIMPRESSION: 1. Multifocal acute ischemia within the left temporal and parietal lobes. 2. Multifocal hyperintense T2-weighted signal within the cerebral white matter, most commonly due to chronic small vessel disease.   Electronically signed by:  Craig Stanford MD 09/23/2023                                                                                                                    Previous Treatment: Unfortunately, Pt had another stroke on 10/04/2023 with exacerbation of his expressive aphasia. See hospitalization notes. His wife noted facial droop on 10/04/23 and took him to the hospital. His blood pressure has been elevated and MD is aware per wife. He completed HEP by sorting words into accurate categories with 100% acc with word bank provided (he reported some fine motor difficulties with this). In session, he was asked to verbalize divergent naming task and named 7 dog breeds with min cues. He was encouraged to use word finding strategies to convey his thoughts when unable to name (describe by size, color, personal association). He named high frequency words with 100% acc and answered personally relevant Wh questions with 90% acc with min cues. Additional HEP provided and continue to address expressive language deficits next session.  TREATMENT DATE: 10/18/23  Pt appeared frustrated when he entered the treatment room today. His wife said he has become aggravated by not being able to drive. His blood pressure has also been high. He appeared to have some trouble sorting the recent details of his medical history so SLP facilitated writing down the important events for him on his communication template. He stopped smoking 10/04/2023. His blood pressure was 159/92 today and he acknowledges that goal is 130-140 systolic and PCP recently changed his BP medications. He exhibited false starts and hesitations in speech today, but did have some islands of fluency ([name] stepping in and reaching out of bounds). He exhibited paraphasic errors with divergent naming tasks (September, Garvin) and stated that it was just challenging to write responses due to fine motor deficits, however this appears to be aphasic errors. He completed verbal divergent naming  task with 100% acc with min cues. Continue to target goals.   PATIENT EDUCATION: Education details: Divergent naming without word bank  Person educated: Patient and Spouse Education method: Explanation, Demonstration, and Handouts Education comprehension: verbalized understanding   GOALS: Goals reviewed with patient? Yes  SHORT TERM GOALS: Target date: 11/01/2023  Pt will implement word-finding strategies with 90% accuracy when unable to verbalize desired word in conversation/functional tasks with min assist. Baseline: 70% Goal status: ONGOING  2.  Pt will describe objects and pictures by providing at least three salient features as judged by clinician with 90% acc when provided min cues.  Baseline: 75% Goal status: ONGOING  3.  Pt will complete  moderately complex abstract divergent naming tasks with 90% acc with min cues.  Baseline: mod cues Goal status: ONGOING  4.  Pt will complete high level verbal expression tasks (object description, state function, provide short summary, etc) using short sentences with 90% acc and min assist. Baseline: 70% Goal status: ONGOING  5.  Pt will respond to open-ended questions using a complete sentence with 80% acc and min cues. Baseline:  Goal status: ONGOING  6.  Pt will follow multistep auditory directions with 90% acc when provided min cues. Baseline: mod cues and repetition required Goal status: ONGOING  LONG TERM GOALS: Same as short term goals   ASSESSMENT:  CLINICAL IMPRESSION: (initial evaluation 09/27/23) Patient is a 68 y.o. male who was seen today for a cognitive linguistic evaluation. Pt presents with mild/mod expressive language deficits and mild receptive language deficits which negatively impacts executive functioning. Pt primarily presents with dysnomia and reduced length of utterances for answering open ended questions. He had difficulty listing hobbies, restaurants he frequents, and other important names. He was able to  follow 2-step directions, but breakdowns occurred with lengthier/complex auditory directions. 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 He is motivated to improve his language skills and has excellent family support.   OBJECTIVE IMPAIRMENTS: include expressive language, receptive language, and aphasia. These impairments are limiting patient from effectively communicating at home and in community. Factors affecting potential to achieve goals and functional outcome are previous level of function and family/community support. Patient will benefit from skilled SLP services to address above impairments and improve overall function.  REHAB POTENTIAL: Excellent  PLAN:  SLP FREQUENCY: 2x/week  SLP DURATION: 4 weeks  PLANNED INTERVENTIONS: Cueing hierachy, Internal/external aids, Functional tasks, Multimodal communication approach, SLP instruction and feedback, Compensatory strategies, Patient/family education, [phone] Treatment of speech (30 or 45 min) , and [id number]- Speech [address], [place], Phon, Eval Compre, Express   Thank you,  Craig Candy, CCC-SLP [phone]  Taeshaun Rames, CCC-SLP 10/18/2023, 10:18 AM

## 2023-10-19 NOTE — Telephone Encounter (Signed)
 Called made to patient's wife per Lauraine Louder, ANP to inquire about cancelled neurology appointment.  Mrs Poland stated that patient did go to appointment but the neurologist was called out on an emergency and the appointment had to be rescheduled for 10/24/23.

## 2023-10-23 ENCOUNTER — Encounter (HOSPITAL_COMMUNITY): Payer: Self-pay | Admitting: Speech Pathology

## 2023-10-23 ENCOUNTER — Encounter (HOSPITAL_COMMUNITY): Admitting: Occupational Therapy

## 2023-10-23 ENCOUNTER — Ambulatory Visit (HOSPITAL_COMMUNITY): Admitting: Speech Pathology

## 2023-10-23 DIAGNOSIS — R4701 Aphasia: Secondary | ICD-10-CM

## 2023-10-23 DIAGNOSIS — R41841 Cognitive communication deficit: Secondary | ICD-10-CM

## 2023-10-23 NOTE — Therapy (Signed)
 OUTPATIENT SPEECH LANGUAGE PATHOLOGY TREATMENT   Patient Name: Craig Rivera MRN: [id number] DOB:[birth year], 68 y.o., male Today's Date: 10/23/2023  PCP: Craig Norleen PEDLAR, MD REFERRING PROVIDER: Jerri Pfeiffer, MD  END OF SESSION:  End of Session - 10/23/23 1347     Visit Number 6    Number of Visits 9    Date for SLP Re-Evaluation 11/06/23    Authorization Type [employer]   eff:02/07/23 oop: copay 5 auth no sw jacquline ref # [id number]   SLP Start Time 1345    SLP Stop Time  1430    SLP Time Calculation (min) 45 min    Activity Tolerance Patient tolerated treatment well          Past Medical History:  Diagnosis Date   Back pain    DVT (deep venous thrombosis) (HCC)    GERD (gastroesophageal reflux disease)    HTN (hypertension)    Hypercholesterolemia    Multiple myeloma (HCC)    Sciatic nerve pain    Past Surgical History:  Procedure Laterality Date   BONE MARROW TRANSPLANT     CATARACT EXTRACTION W/PHACO Right 08/11/2014   Procedure: CATARACT EXTRACTION PHACO AND INTRAOCULAR LENS PLACEMENT (IOC);  Surgeon: Craig JULIANNA Burke, MD;  Location: [facility];  Service: Ophthalmology;  Laterality: Right;  CDE 4.88   CATARACT EXTRACTION W/PHACO Left 10/26/2014   Procedure: CATARACT EXTRACTION PHACO AND INTRAOCULAR LENS PLACEMENT LEFT EYE CDE=8.64;  Surgeon: Craig JULIANNA Burke, MD;  Location: [facility];  Service: Ophthalmology;  Laterality: Left;   COLONOSCOPY  09/12/2010   Procedure: COLONOSCOPY;  Surgeon: Craig CHRISTELLA Hollingshead, MD;  Location: [facility];  Service: Endoscopy;  Laterality: N/A;   COLONOSCOPY WITH PROPOFOL  N/A 01/06/2022   Procedure: COLONOSCOPY WITH PROPOFOL ;  Surgeon: Craig Carlin POUR, DO;  Location: [facility];  Service: Endoscopy;  Laterality: N/A;  10:00am   inguinal hernia repair bilateral     as Craig    KNEE SURGERY Right    arthroscopy   POLYPECTOMY  01/06/2022   Procedure: POLYPECTOMY;  Surgeon: Craig Carlin POUR, DO;  Location: [facility];  Service:  Endoscopy;;   TONSILLECTOMY     Patient Active Problem List   Diagnosis Date Noted   Acute CVA (cerebrovascular accident) (HCC) 10/04/2023   Acute ischemic left middle cerebral artery (MCA) stroke (HCC) 10/04/2023   Weakness 10/04/2023   Acute ischemic left MCA stroke (HCC) 09/23/2023   Positive colorectal cancer screening using Cologuard test 12/09/2021   Hypokalemia 11/09/2015   Multiple myeloma (HCC) 07/22/2015   Encounter for screening colonoscopy 08/30/2010    ONSET DATE: 09/23/2023   REFERRING DIAG: I63.9 (ICD-10-CM) - Stroke, acute, thrombotic (HCC)  THERAPY DIAG:  Aphasia  Cognitive communication deficit  Rationale for Evaluation and Treatment: Rehabilitation  SUBJECTIVE:   SUBJECTIVE STATEMENT: I want to say barber, but that's not right. Craig Rivera)  Pt accompanied by: self and significant other  PERTINENT HISTORY: Patient is a 68 y.o. male with PMH: HTN, HLD, multiple myeloma on opioids who presented to the hospital on 09/23/23 with mild apasia. His wife drove him to the ED. MRI of brain showed Multifocal acute ischemia within the left temporal and parietal lobes. Pt was hospitalized 8/17-8/19/25 and discharged home with his wife. He was referred for outpatient SLP therapy by Dr. Pfeiffer Craig.  PAIN:  Are you having pain? No  FALLS: Has patient fallen in last 6 months?  No  LIVING ENVIRONMENT: Lives with: lives with their spouse Lives in: House/apartment  PLOF:  Level of assistance: Independent with ADLs, Independent with IADLs Employment: Retired  PATIENT GOALS: Improve speech  OBJECTIVE:  Note: Objective measures were completed at Evaluation unless otherwise noted.  DIAGNOSTIC FINDINGS:   MRI BRAIN WITH AND WITHOUT CONTRASTIMPRESSION: 1. Multifocal acute ischemia within the left temporal and parietal lobes. 2. Multifocal hyperintense T2-weighted signal within the cerebral white matter, most commonly due to chronic small vessel disease.    Electronically signed by: Craig Stanford MD 09/23/2023                                                                                                                    Previous Treatment:  Pt appeared frustrated when he entered the treatment room today. His wife said he has become aggravated by not being able to drive. His blood pressure has also been high. He appeared to have some trouble sorting the recent details of his medical history so SLP facilitated writing down the important events for him on his communication template. He stopped smoking 10/04/2023. His blood pressure was 159/92 today and he acknowledges that goal is 130-140 systolic and PCP recently changed his BP medications. He exhibited false starts and hesitations in speech today, but did have some islands of fluency ([name] stepping in and reaching out of bounds). He exhibited paraphasic errors with divergent naming tasks (September, Garvin) and stated that it was just challenging to write responses due to fine motor deficits, however this appears to be aphasic errors. He completed verbal divergent naming task with 100% acc with min cues. Continue to target goals.  TREATMENT DATE: 10/23/23  Pt accompanied to therapy by his wife. He indicates that his blood pressure has had better readings. He continues to report frustration with communication and is experiencing tip of the tongue phenomenon. SLP reviewed word finding strategies and provided written guide for implementation of strategies and modeled for Pt using the word orange. SLP assisted in having Pt complete descriptive features task with selected words (80% min/mod assist). SLP asked Pt to name occupations and when he had difficulty, SLP provided an association word (teeth, heart, car, beef tenderloin, [place], pet) and he was then able to name the occupation with 100% acc with mi/mod cues. Pt asked to describe pictures with distinct features and he completed with mod assist.  Continue to target goals. He meets with the interventional radiologist later today.  PATIENT EDUCATION: Education details: Counsellor and antonym task  Person educated: Patient and Spouse Education method: Explanation, Demonstration, and Handouts Education comprehension: verbalized understanding   GOALS: Goals reviewed with patient? Yes  SHORT TERM GOALS: Target date: 11/01/2023  Pt will implement word-finding strategies with 90% accuracy when unable to verbalize desired word in conversation/functional tasks with min assist. Baseline: 70% Goal status: ONGOING  2.  Pt will describe objects and pictures by providing at least three salient features as judged by clinician with 90% acc when provided min cues.  Baseline: 75% Goal status: ONGOING  3.  Pt will complete moderately complex abstract  divergent naming tasks with 90% acc with min cues.  Baseline: mod cues Goal status: ONGOING  4.  Pt will complete high level verbal expression tasks (object description, state function, provide short summary, etc) using short sentences with 90% acc and min assist. Baseline: 70% Goal status: ONGOING  5.  Pt will respond to open-ended questions using a complete sentence with 80% acc and min cues. Baseline:  Goal status: ONGOING  6.  Pt will follow multistep auditory directions with 90% acc when provided min cues. Baseline: mod cues and repetition required Goal status: ONGOING  LONG TERM GOALS: Same as short term goals   ASSESSMENT:  CLINICAL IMPRESSION: (initial evaluation 09/27/23) Patient is a 68 y.o. male who was seen today for a cognitive linguistic evaluation. Pt presents with mild/mod expressive language deficits and mild receptive language deficits which negatively impacts executive functioning. Pt primarily presents with dysnomia and reduced length of utterances for answering open ended questions. He had difficulty listing hobbies, restaurants he frequents, and other important names. He  was able to follow 2-step directions, but breakdowns occurred with lengthier/complex auditory directions. 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 He is motivated to improve his language skills and has excellent family support.   OBJECTIVE IMPAIRMENTS: include expressive language, receptive language, and aphasia. These impairments are limiting patient from effectively communicating at home and in community. Factors affecting potential to achieve goals and functional outcome are previous level of function and family/community support. Patient will benefit from skilled SLP services to address above impairments and improve overall function.  REHAB POTENTIAL: Excellent  PLAN:  SLP FREQUENCY: 2x/week  SLP DURATION: 4 weeks  PLANNED INTERVENTIONS: Cueing hierachy, Internal/external aids, Functional tasks, Multimodal communication approach, SLP instruction and feedback, Compensatory strategies, Patient/family education, [phone] Treatment of speech (30 or 45 min) , and [id number]- Speech [address], [place], Phon, Eval Compre, Express   Thank you,  Craig Candy, CCC-SLP [phone]  Freedom Peddy, CCC-SLP 10/23/2023, 1:54 PM

## 2023-10-24 ENCOUNTER — Ambulatory Visit (INDEPENDENT_AMBULATORY_CARE_PROVIDER_SITE_OTHER): Admitting: Neuroradiology

## 2023-10-24 ENCOUNTER — Other Ambulatory Visit: Payer: Self-pay

## 2023-10-24 ENCOUNTER — Encounter: Payer: Self-pay | Admitting: Neuroradiology

## 2023-10-24 VITALS — BP 134/86 | HR 73 | Ht 70.0 in | Wt 210.0 lb

## 2023-10-24 DIAGNOSIS — I63512 Cerebral infarction due to unspecified occlusion or stenosis of left middle cerebral artery: Secondary | ICD-10-CM

## 2023-10-24 NOTE — Transitions of Care (Post Inpatient/ED Visit) (Signed)
 Transition of Care week 3  Visit Note  10/24/2023  Name: Craig Rivera MRN: [id number]          DOB: 27-Jan-1956  Situation: Patient enrolled in [facility] 30-day program. Visit completed with patient by telephone.   Background:   Initial Transition Care Management Follow-up Telephone Call    Past Medical History:  Diagnosis Date   Back pain    DVT (deep venous thrombosis) (HCC)    GERD (gastroesophageal reflux disease)    HTN (hypertension)    Hypercholesterolemia    Multiple myeloma (HCC)    Sciatic nerve pain     Assessment: Patient Reported Symptoms: Cognitive Cognitive Status: Alert and oriented to person, place, and time, Normal speech and language skills      Neurological Neurological Review of Symptoms: Numbness, Other: [place]her Neurological Symptoms/Conditions [RPT]: Recent Stroke- continues word finding.  Working with outpatient ST. Patient reports chronic numbness to feet Neurological Management Strategies: Routine screening, Medication therapy Neurological Self-Management Outcome: 4 (good) Neurological Comment:  Sudden numbness or weakness in the face, arm, or leg, especially on one side of the body.   Sudden confusion, trouble speaking, or difficulty understanding speech.   Sudden trouble seeing in one or both eyes.   Sudden trouble walking, dizziness, loss of balance, or lack of coordination.   Sudden severe headache with no known cause.  Call 9-1-1 right away if you or someone else has any of these symptoms.  HEENT HEENT Symptoms Reported: No symptoms reported      Cardiovascular Cardiovascular Symptoms Reported: No symptoms reported    Respiratory Respiratory Symptoms Reported: No symptoms reported    Endocrine Endocrine Symptoms Reported: No symptoms reported    Gastrointestinal Gastrointestinal Symptoms Reported: No symptoms reported      Genitourinary Genitourinary Symptoms Reported: No symptoms reported    Integumentary Integumentary Symptoms Reported: No  symptoms reported    Musculoskeletal Musculoskelatal Symptoms Reviewed: No symptoms reported   Falls in the past year?: No    Psychosocial Psychosocial Symptoms Reported: Sadness - if selected complete PHQ 2-9 Behavioral Management Strategies: Activity, Support system Behavioral Health Self-Management Outcome: 4 (good) Major Change/Loss/Stressor/Fears (CP): Medical condition, self Techniques to Cope with Loss/Stress/Change: Diversional activities Quality of Family Relationships: supportive Do you feel physically threatened by others?: No   There were no vitals filed for this visit.  Medications Reviewed Today     Reviewed by Davontae Prusinski, RN (Case Manager) on 10/24/23 at 1033  Med List Status: <None>   Medication Order Taking? Sig Documenting Provider Last Dose Status Informant  acetaminophen  (TYLENOL ) 500 MG tablet [id number] Yes Take 1,000 mg by mouth 2 (two) times daily as needed for moderate pain (pain score 4-6) or headache. [provider]  Active Self, Pharmacy Records, Spouse/Significant Other  apixaban  (ELIQUIS ) 5 MG TABS tablet [id number] Yes Take 1 tablet (5 mg total) by mouth 2 (two) times daily. Elodie Palma, MD  Active Self, Pharmacy Records, Spouse/Significant Other  aspirin  81 MG chewable tablet [id number] Yes Chew 1 tablet (81 mg total) by mouth daily. de Clint Kill, Cortney E, NP  Active   atorvastatin  (LIPITOR) 20 MG tablet [id number] Yes Take 1 tablet (20 mg total) by mouth daily. Elodie Palma, MD  Active Self, Pharmacy Records, Spouse/Significant Other  dextromethorphan-guaiFENesin [facility] DM) 30-600 MG 12hr tablet [id number] Yes Take 1 tablet by mouth every 12 (twelve) hours. [provider]  Active Self, Pharmacy Records, Spouse/Significant Other  fentaNYL  (DURAGESIC ) 75 MCG/HR [id number] Yes Place 1 patch onto the skin  every 3 (three) days. [provider]  Active Self, Pharmacy Records, Spouse/Significant Other  levocetirizine (XYZAL) 5  MG tablet [id number] Yes Take 5 mg by mouth daily. [provider]  Active Self, Pharmacy Records, Spouse/Significant Other           Med Note (WARD, ANGELICA G   Thu Oct 04, 2023  6:33 PM)    losartan (COZAAR) 50 MG tablet [id number] Yes Take 50 mg by mouth daily. [provider]  Active   oxyCODONE  (OXY IR/ROXICODONE ) 5 MG immediate release tablet [id number] Yes Take 1 tablet (5 mg total) by mouth every 4 (four) hours as needed. pain  Patient taking differently: Take 5 mg by mouth every 6 (six) hours as needed for severe pain (pain score 7-10). pain   Letha Truman ORN, NP  Active Self, Pharmacy Records, Spouse/Significant Other           Med Note DRENA, CHUCK KANDICE Schaumann Oct 04, 2023  6:26 PM)    pantoprazole  (PROTONIX ) 40 MG tablet [id number] Yes Take 40 mg by mouth daily. [provider]  Active Self, Pharmacy Records, Spouse/Significant Other  tamsulosin  (FLOMAX ) 0.4 MG CAPS capsule [id number] Yes Take 0.4 mg by mouth at bedtime. [provider]  Active Self, Pharmacy Records, Spouse/Significant Other  valACYclovir (VALTREX) 500 MG tablet [id number] Yes Take 500 mg by mouth 2 (two) times daily. [provider]  Active Self, Pharmacy Records, Spouse/Significant Other  venlafaxine  XR (EFFEXOR -XR) 75 MG 24 hr capsule [id number] Yes Take 75 mg by mouth daily. [provider]  Active Self, Pharmacy Records, Spouse/Significant Other           Med Note DRENA, CHUCK KANDICE Schaumann Oct 04, 2023  6:33 PM)              Goals Addressed             This Visit's Progress    [facility] Transitions of Care (TOC) Care Plan       Problems:  Recent Hospitalization for treatment of Stroke Knowledge Deficit Related to Stroke and Multiple Myeloma  10/24/23 Patient reports he is doing good.  He continues to word find during conversation but able to verbalize thoughts.  Discussed remaining calm during conversation in order to speak with clarity as this reduces  frustration.   Goal:  Over the next 30 days, the patient will not experience hospital readmission  Interventions:  Transitions of Care: Doctor Visits  - discussed the importance of doctor visits Reinforced with patient signs of stroke: Sudden numbness or weakness in the face, arm, or leg, especially on one side of the body. Sudden confusion, trouble speaking, or difficulty understanding speech. Sudden trouble seeing in one or both eyes. Sudden trouble walking, dizziness, loss of balance, or lack of coordination. Sudden severe headache with no known cause. Call 9-1-1 right away if you or someone else has any of these symptoms.  Oncology:Reviewed upcoming provider appointments and treatment appointments Assessed available transportation to appointments and treatments. Has consistent/reliable transportation: Yes Assessed support system. Has consistent/reliable family or other support: Yes  Patient Self Care Activities:  Call pharmacy for medication refills 3-7 days in advance of running out of medications Call provider office for new concerns or questions  Notify RN Care Manager of TOC call rescheduling needs Participate in Transition of Care Program/Attend TOC scheduled calls Take medications as prescribed    Plan:  The patient has been provided with contact information for the care management  team and has been advised to call with any health related questions or concerns.         Recommendation:   Continue Current Plan of Care  Follow Up Plan:   Telephone follow-up in 1 week  Dellanira Dillow J. Sukaina Toothaker RN, MSN [facility], [facility] RN Care Manager Direct Dial: [phone]  Fax: [phone] Website: [email or web address]

## 2023-10-24 NOTE — Progress Notes (Signed)
 I had the pleasure of seeing Craig Rivera in the office today.  He was accompanied by his wife.  Briefly he is 1 and right-handed.  On 09/23/2023 he suffered a left hemispheric stroke, which predominantly was speech abnormality with some right sided weakness.  The symptoms improved.  He had slight worsening of the symptoms on 10/04/2023 and had repeat imaging of his CT and CT angiogram on that day, and a repeat MRI the next day.  I reviewed all of the above imaging.  He does not have extracranial carotid artery stenosis.  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  The brain MRIs show small embolic infarcts in the MCA territory, mostly in the temporal/parietal region.  I reviewed his echocardiogram from 09/24/2023 which showed a normal ejection fraction and no cardiac embolic cause.  He was diagnosed with multiple myeloma about 10 years ago.  Around 5 years ago he had a DVT, without pulmonary embolism, and has been on anticoagulation since then.  His current anticoagulation is apixaban  and aspirin .  He is also on 20 mg atorvastatin  and takes medication for his blood pressure.  Blood pressure in the office today is 134/86.  He is overweight but otherwise appears healthy.  Lungs are clear.  Heart sounds are normal.  There is a mild expressive aphasia.  No receptive component.  No dysarthria.  His visual fields are full in both eyes.  His strength, sensation and coordination are normal in the arms and legs.  There is no inattention.  Assessment:  1.  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  No surgery needed.  I explained to the patient and his wife that there is no advantage to stenting but over medical treatment alone in the situation.  Where he developed recurrent events on medical therapy  we can reconsider. 2.  No routine follow-up imaging needed unless he develops new stroke symptoms 3.  From the standpoint of the intracranial atherosclerotic disease, aspirin  and apixaban  are an acceptable combination.  If he no longer needs the apixaban , I would suggest switching to clopidogrel  for 6 months. 4.  He was asking about driving.  He drives very little and only locally.  I do not see anything on his neurologic exam which would contraindicate driving.  I have not scheduled any routine follow-up with me.  I am glad to see him again at any time as needed.  If

## 2023-10-24 NOTE — Patient Instructions (Signed)
/  Visit Information/  Thank you for taking time to visit with me today. Please don't hesitate to contact me if I can be of assistance to you before our next scheduled telephone appointment.  Our next appointment is by telephone on 10/31/23 at 1000 am  Following is a copy of your care plan:   Goals Addressed             This Visit's Progress    [facility] Transitions of Care (TOC) Care Plan       Problems:  Recent Hospitalization for treatment of Stroke Knowledge Deficit Related to Stroke and Multiple Myeloma  10/24/23 Patient reports he is doing good.  He continues to word find during conversation but able to verbalize thoughts.  Discussed remaining calm during conversation in order to speak with clarity as this reduces frustration.   Goal:  Over the next 30 days, the patient will not experience hospital readmission  Interventions:  Transitions of Care: Doctor Visits  - discussed the importance of doctor visits Reinforced with patient signs of stroke: Sudden numbness or weakness in the face, arm, or leg, especially on one side of the body. Sudden confusion, trouble speaking, or difficulty understanding speech. Sudden trouble seeing in one or both eyes. Sudden trouble walking, dizziness, loss of balance, or lack of coordination. Sudden severe headache with no known cause. Call 9-1-1 right away if you or someone else has any of these symptoms.  Oncology:Reviewed upcoming provider appointments and treatment appointments Assessed available transportation to appointments and treatments. Has consistent/reliable transportation: Yes Assessed support system. Has consistent/reliable family or other support: Yes  Patient Self Care Activities:  Call pharmacy for medication refills 3-7 days in advance of running out of medications Call provider office for new concerns or questions  Notify RN Care Manager of TOC call rescheduling needs Participate in Transition of Care Program/Attend TOC  scheduled calls Take medications as prescribed    Plan:  The patient has been provided with contact information for the care management team and has been advised to call with any health related questions or concerns.         Patient verbalizes understanding of instructions and care plan provided today and agrees to view in MyChart. Active MyChart status and patient understanding of how to access instructions and care plan via MyChart confirmed with patient.     The patient has been provided with contact information for the care management team and has been advised to call with any health related questions or concerns.   Please call the care guide team at [phone] if you need to cancel or reschedule your appointment.   Please call the Suicide and Crisis Lifeline: 988 if you are experiencing a Mental Health or Behavioral Health Crisis or need someone to talk to.  Claira Jeter J. Keanna Tugwell RN, MSN [facility], [facility] RN Care Manager Direct Dial: [phone]  Fax: [phone] Website: [email or web address]

## 2023-10-25 ENCOUNTER — Encounter (HOSPITAL_COMMUNITY): Admitting: Occupational Therapy

## 2023-10-25 ENCOUNTER — Ambulatory Visit (HOSPITAL_COMMUNITY): Admitting: Speech Pathology

## 2023-10-25 DIAGNOSIS — R41841 Cognitive communication deficit: Secondary | ICD-10-CM

## 2023-10-25 DIAGNOSIS — R4701 Aphasia: Secondary | ICD-10-CM

## 2023-10-25 NOTE — Therapy (Signed)
 OUTPATIENT SPEECH LANGUAGE PATHOLOGY TREATMENT   Patient Name: Craig Rivera MRN: [id number] DOB:07-13-1955, 68 y.o., male Today's Date: 10/25/2023  PCP: Shona Norleen PEDLAR, MD REFERRING PROVIDER: Jerri Pfeiffer, MD  END OF SESSION:  End of Session - 10/25/23 1014     Visit Number 7    Number of Visits 9    Date for Recertification  11/06/23    Authorization Type [employer]   eff:02/07/23 oop: copay 5 auth no sw jacquline ref # [id number]   SLP Start Time 0940    SLP Stop Time  1025    SLP Time Calculation (min) 45 min    Activity Tolerance Patient tolerated treatment well          Past Medical History:  Diagnosis Date   Back pain    DVT (deep venous thrombosis) (HCC)    GERD (gastroesophageal reflux disease)    HTN (hypertension)    Hypercholesterolemia    Multiple myeloma (HCC)    Sciatic nerve pain    Past Surgical History:  Procedure Laterality Date   BONE MARROW TRANSPLANT     CATARACT EXTRACTION W/PHACO Right 08/11/2014   Procedure: CATARACT EXTRACTION PHACO AND INTRAOCULAR LENS PLACEMENT (IOC);  Surgeon: Dow JULIANNA Burke, MD;  Location: [facility];  Service: Ophthalmology;  Laterality: Right;  CDE 4.88   CATARACT EXTRACTION W/PHACO Left 10/26/2014   Procedure: CATARACT EXTRACTION PHACO AND INTRAOCULAR LENS PLACEMENT LEFT EYE CDE=8.64;  Surgeon: Dow JULIANNA Burke, MD;  Location: [facility];  Service: Ophthalmology;  Laterality: Left;   COLONOSCOPY  09/12/2010   Procedure: COLONOSCOPY;  Surgeon: Lamar CHRISTELLA Hollingshead, MD;  Location: [facility];  Service: Endoscopy;  Laterality: N/A;   COLONOSCOPY WITH PROPOFOL  N/A 01/06/2022   Procedure: COLONOSCOPY WITH PROPOFOL ;  Surgeon: Cindie Carlin POUR, DO;  Location: [facility];  Service: Endoscopy;  Laterality: N/A;  10:00am   inguinal hernia repair bilateral     as child    KNEE SURGERY Right    arthroscopy   POLYPECTOMY  01/06/2022   Procedure: POLYPECTOMY;  Surgeon: Cindie Carlin POUR, DO;  Location: [facility];  Service:  Endoscopy;;   TONSILLECTOMY     Patient Active Problem List   Diagnosis Date Noted   Ischemic stroke (HCC) 10/16/2023   Acute CVA (cerebrovascular accident) (HCC) 10/04/2023   Acute ischemic left middle cerebral artery (MCA) stroke (HCC) 10/04/2023   Weakness 10/04/2023   Acute ischemic left MCA stroke (HCC) 09/23/2023   Dizziness 05/15/2023   Other specified abnormalities of plasma proteins 11/12/2022   Impaired fasting glucose 11/12/2022   Thrombocytopenic disorder (HCC) 11/12/2022   Cyst of nasal sinus 05/12/2022   Lower urinary tract symptoms due to benign prostatic hyperplasia 05/12/2022   Obesity 05/12/2022   Thyroid  nodule 05/12/2022   Dysuria 02/01/2022   Chronic pain 01/31/2022   Edema of lower extremity 01/31/2022   Gastroesophageal reflux disease without esophagitis 01/31/2022   Mixed hyperlipidemia 01/31/2022   Nausea 01/31/2022   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) 01/31/2022   Sciatica 01/31/2022   Positive colorectal cancer screening using Cologuard test 12/09/2021   Acute sinusitis 10/04/2021   Chronic sinusitis 10/04/2021   Diarrhea 10/04/2021   Encounter for antineoplastic chemotherapy 04/29/2021   Encounter for screening for other viral diseases 04/29/2021   Productive cough 12/13/2020   Upper respiratory infection 12/13/2020   Counseling and coordination of care 05/12/2020   Chronic anticoagulation 08/19/2019   Compression fracture of thoracic spine, non-traumatic, sequela 07/28/2019  Pulmonary embolism (HCC) 09/11/2018   Chemotherapy-induced peripheral neuropathy (HCC) 02/11/2018   DVT of deep femoral vein, right (HCC) 11/29/2017   Cancer-related pain 07/25/2017   Essential hypertension 05/15/2017   Cough in adult 07/27/2015   Multiple myeloma (HCC) 07/22/2015   Easy fatigability 06/01/2015   Foreign body of right eye 06/01/2015   Rales 1/4 way up posterior chest wall on right side 04/06/2015   Chronic low back pain  02/09/2015   Sleeping difficulty 02/09/2015   Acute left-sided low back pain without sciatica 08/12/2014   Closed compression fracture of thoracic vertebra (HCC) 07/13/2014   Lipoma of abdominal wall 07/13/2014   Slow transit constipation 06/29/2014   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   Aching pain 04/07/2014   Diverticulitis large intestine 03/03/2014   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   S/P autologous bone marrow transplantation (HCC) 03/03/2014   Hypokalemia 01/19/2014   Smoking greater than 40 pack years 12/15/2013   Smoking trying to quit 12/15/2013   Hives of unknown origin 12/08/2013   Abnormal LFTs 12/01/2013   H/O peripheral stem cell transplant (HCC) 10/23/2013   Myeloma (HCC) 04/02/2013   Basal cell carcinoma 05/10/2011   Other seborrheic keratosis 04/20/2011   Encounter for screening colonoscopy 08/30/2010   Encounter for screening for malignant neoplasm of colon 08/30/2010    ONSET DATE: 09/23/2023   REFERRING DIAG: I63.9 (ICD-10-CM) - Stroke, acute, thrombotic (HCC)  THERAPY DIAG:  Aphasia  Cognitive communication deficit  Rationale for Evaluation and Treatment: Rehabilitation  SUBJECTIVE:   SUBJECTIVE STATEMENT: That appointment went very well. (Dr. Lester)  Pt accompanied by: self and significant other  PERTINENT HISTORY: Patient is a 68 y.o. male with PMH: HTN, HLD, multiple myeloma on opioids who presented to the hospital on 09/23/23 with mild apasia. His wife drove him to the ED. MRI of brain showed Multifocal acute ischemia within the left temporal and parietal lobes. Pt was hospitalized 8/17-8/19/25 and discharged home with his wife. He was referred for outpatient SLP therapy by Dr. Ary Cummins.  PAIN:  Are you having pain? No  FALLS: Has patient fallen in last 6 months?  No  LIVING ENVIRONMENT: Lives with: lives with their spouse Lives in: House/apartment  PLOF:  Level of assistance: Independent with ADLs, Independent with  IADLs Employment: Retired  PATIENT GOALS: Improve speech  OBJECTIVE:  Note: Objective measures were completed at Evaluation unless otherwise noted.  DIAGNOSTIC FINDINGS:   MRI BRAIN WITH AND WITHOUT CONTRASTIMPRESSION: 1. Multifocal acute ischemia within the left temporal and parietal lobes. 2. Multifocal hyperintense T2-weighted signal within the cerebral white matter, most commonly due to chronic small vessel disease.   Electronically signed by: Franky Stanford MD 09/23/2023                                                                                                                    Previous Treatment:  Pt accompanied to therapy by his wife. He indicates that his blood pressure has had better readings. He continues to report frustration with communication and  is experiencing tip of the tongue phenomenon. SLP reviewed word finding strategies and provided written guide for implementation of strategies and modeled for Pt using the word orange. SLP assisted in having Pt complete descriptive features task with selected words (80% min/mod assist). SLP asked Pt to name occupations and when he had difficulty, SLP provided an association word (teeth, heart, car, beef tenderloin, [place], pet) and he was then able to name the occupation with 100% acc with mi/mod cues. Pt asked to describe pictures with distinct features and he completed with mod assist. Continue to target goals. He meets with the interventional radiologist later today.  TREATMENT DATE: 10/25/23  Pt reported that his appointment with the interventional radiologist went well. He was given permission to drive and that has improved his spirits. In session, he orally read sentence descriptions with 100% acc and then provided the targeted response with 90% acc. He completed synonym task with 100% acc with min assist and antonym task with 80% acc. Pt cued to use each word in a sentence to help convey meaning. SLP explained that he does  not have to have a perfect synonym/antonym for each one, just one that makes sense to the listener. He is encouraged to use these strategies when he is unable to verbalize the desired word in conversation. He was given additional HEP to circle to word that does not belong. Next session, plan to incorporate having him tell short stories to see if he is able to implement use of word finding strategies.   PATIENT EDUCATION: Education details: Counsellor and antonym task  Person educated: Patient and Spouse Education method: Explanation, Demonstration, and Handouts Education comprehension: verbalized understanding   GOALS: Goals reviewed with patient? Yes  SHORT TERM GOALS: Target date: 11/01/2023  Pt will implement word-finding strategies with 90% accuracy when unable to verbalize desired word in conversation/functional tasks with min assist. Baseline: 70% Goal status: ONGOING  2.  Pt will describe objects and pictures by providing at least three salient features as judged by clinician with 90% acc when provided min cues.  Baseline: 75% Goal status: ONGOING  3.  Pt will complete moderately complex abstract divergent naming tasks with 90% acc with min cues.  Baseline: mod cues Goal status: ONGOING  4.  Pt will complete high level verbal expression tasks (object description, state function, provide short summary, etc) using short sentences with 90% acc and min assist. Baseline: 70% Goal status: ONGOING  5.  Pt will respond to open-ended questions using a complete sentence with 80% acc and min cues. Baseline:  Goal status: ONGOING  6.  Pt will follow multistep auditory directions with 90% acc when provided min cues. Baseline: mod cues and repetition required Goal status: ONGOING  LONG TERM GOALS: Same as short term goals   ASSESSMENT:  CLINICAL IMPRESSION: (initial evaluation 09/27/23) Patient is a 68 y.o. male who was seen today for a cognitive linguistic evaluation. Pt presents  with mild/mod expressive language deficits and mild receptive language deficits which negatively impacts executive functioning. Pt primarily presents with dysnomia and reduced length of utterances for answering open ended questions. He had difficulty listing hobbies, restaurants he frequents, and other important names. He was able to follow 2-step directions, but breakdowns occurred with lengthier/complex auditory directions. 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 He is motivated to improve his language skills and has excellent family support.  OBJECTIVE IMPAIRMENTS: include expressive language, receptive language, and aphasia. These impairments are limiting patient from effectively communicating at home and in community. Factors affecting potential to achieve goals and functional outcome are previous level of function and family/community support. Patient will benefit from skilled SLP services to address above impairments and improve overall function.  REHAB POTENTIAL: Excellent  PLAN:  SLP FREQUENCY: 2x/week  SLP DURATION: 4 weeks  PLANNED INTERVENTIONS: Cueing hierachy, Internal/external aids, Functional tasks, Multimodal communication approach, SLP instruction and feedback, Compensatory strategies, Patient/family education, [phone] Treatment of speech (30 or 45 min) , and [id number]- Speech [phone], Artic, Phon, Eval Compre, Express   Thank you,  Lamar Candy, CCC-SLP [phone]  Tauheedah Bok, CCC-SLP 10/25/2023, 10:16 AM

## 2023-10-29 DIAGNOSIS — G4730 Sleep apnea, unspecified: Secondary | ICD-10-CM | POA: Diagnosis not present

## 2023-10-29 DIAGNOSIS — C9000 Multiple myeloma not having achieved remission: Secondary | ICD-10-CM | POA: Diagnosis not present

## 2023-10-29 DIAGNOSIS — Z8673 Personal history of transient ischemic attack (TIA), and cerebral infarction without residual deficits: Secondary | ICD-10-CM | POA: Diagnosis not present

## 2023-10-30 ENCOUNTER — Encounter (HOSPITAL_COMMUNITY): Admitting: Occupational Therapy

## 2023-10-30 ENCOUNTER — Ambulatory Visit (HOSPITAL_COMMUNITY): Admitting: Speech Pathology

## 2023-10-30 ENCOUNTER — Inpatient Hospital Stay: Admitting: Adult Health

## 2023-10-30 DIAGNOSIS — Z8673 Personal history of transient ischemic attack (TIA), and cerebral infarction without residual deficits: Secondary | ICD-10-CM | POA: Diagnosis not present

## 2023-10-30 DIAGNOSIS — Z7901 Long term (current) use of anticoagulants: Secondary | ICD-10-CM | POA: Diagnosis not present

## 2023-10-30 DIAGNOSIS — I69392 Facial weakness following cerebral infarction: Secondary | ICD-10-CM | POA: Diagnosis not present

## 2023-10-30 DIAGNOSIS — Z79899 Other long term (current) drug therapy: Secondary | ICD-10-CM | POA: Diagnosis not present

## 2023-10-30 DIAGNOSIS — R4701 Aphasia: Secondary | ICD-10-CM

## 2023-10-30 DIAGNOSIS — Z9484 Stem cells transplant status: Secondary | ICD-10-CM | POA: Diagnosis not present

## 2023-10-30 DIAGNOSIS — Z48290 Encounter for aftercare following bone marrow transplant: Secondary | ICD-10-CM | POA: Diagnosis not present

## 2023-10-30 DIAGNOSIS — Z5111 Encounter for antineoplastic chemotherapy: Secondary | ICD-10-CM | POA: Diagnosis not present

## 2023-10-30 DIAGNOSIS — I69320 Aphasia following cerebral infarction: Secondary | ICD-10-CM | POA: Diagnosis not present

## 2023-10-30 DIAGNOSIS — R41841 Cognitive communication deficit: Secondary | ICD-10-CM

## 2023-10-30 DIAGNOSIS — G893 Neoplasm related pain (acute) (chronic): Secondary | ICD-10-CM | POA: Diagnosis not present

## 2023-10-30 DIAGNOSIS — F32A Depression, unspecified: Secondary | ICD-10-CM | POA: Diagnosis not present

## 2023-10-30 DIAGNOSIS — C9000 Multiple myeloma not having achieved remission: Secondary | ICD-10-CM | POA: Diagnosis not present

## 2023-10-30 DIAGNOSIS — Z1159 Encounter for screening for other viral diseases: Secondary | ICD-10-CM | POA: Diagnosis not present

## 2023-10-30 DIAGNOSIS — E041 Nontoxic single thyroid nodule: Secondary | ICD-10-CM | POA: Diagnosis not present

## 2023-10-30 NOTE — Therapy (Signed)
 OUTPATIENT SPEECH LANGUAGE PATHOLOGY TREATMENT   Patient Name: Craig Rivera MRN: [id number] DOB:1955/08/22, 68 y.o., male Today's Date: 10/30/2023  PCP: Shona Norleen PEDLAR, MD REFERRING PROVIDER: Jerri Pfeiffer, MD  END OF SESSION:  End of Session - 10/30/23 1526     Visit Number 8    Number of Visits 9    Date for Recertification  11/06/23    Authorization Type [employer]   eff:02/07/23 oop: copay 5 auth no sw jacquline ref # [id number]   SLP Start Time 1515    SLP Stop Time  1600    SLP Time Calculation (min) 45 min    Activity Tolerance Patient tolerated treatment well          Past Medical History:  Diagnosis Date   Back pain    DVT (deep venous thrombosis) (HCC)    GERD (gastroesophageal reflux disease)    HTN (hypertension)    Hypercholesterolemia    Multiple myeloma (HCC)    Sciatic nerve pain    Past Surgical History:  Procedure Laterality Date   BONE MARROW TRANSPLANT     CATARACT EXTRACTION W/PHACO Right 08/11/2014   Procedure: CATARACT EXTRACTION PHACO AND INTRAOCULAR LENS PLACEMENT (IOC);  Surgeon: Dow JULIANNA Burke, MD;  Location: [facility];  Service: Ophthalmology;  Laterality: Right;  CDE 4.88   CATARACT EXTRACTION W/PHACO Left 10/26/2014   Procedure: CATARACT EXTRACTION PHACO AND INTRAOCULAR LENS PLACEMENT LEFT EYE CDE=8.64;  Surgeon: Dow JULIANNA Burke, MD;  Location: [facility];  Service: Ophthalmology;  Laterality: Left;   COLONOSCOPY  09/12/2010   Procedure: COLONOSCOPY;  Surgeon: Lamar CHRISTELLA Hollingshead, MD;  Location: [facility];  Service: Endoscopy;  Laterality: N/A;   COLONOSCOPY WITH PROPOFOL  N/A 01/06/2022   Procedure: COLONOSCOPY WITH PROPOFOL ;  Surgeon: Cindie Carlin POUR, DO;  Location: [facility];  Service: Endoscopy;  Laterality: N/A;  10:00am   inguinal hernia repair bilateral     as child    KNEE SURGERY Right    arthroscopy   POLYPECTOMY  01/06/2022   Procedure: POLYPECTOMY;  Surgeon: Cindie Carlin POUR, DO;  Location: [facility];  Service:  Endoscopy;;   TONSILLECTOMY     Patient Active Problem List   Diagnosis Date Noted   Ischemic stroke (HCC) 10/16/2023   Acute CVA (cerebrovascular accident) (HCC) 10/04/2023   Acute ischemic left middle cerebral artery (MCA) stroke (HCC) 10/04/2023   Weakness 10/04/2023   Acute ischemic left MCA stroke (HCC) 09/23/2023   Dizziness 05/15/2023   Other specified abnormalities of plasma proteins 11/12/2022   Impaired fasting glucose 11/12/2022   Thrombocytopenic disorder 11/12/2022   Cyst of nasal sinus 05/12/2022   Lower urinary tract symptoms due to benign prostatic hyperplasia 05/12/2022   Obesity 05/12/2022   Thyroid  nodule 05/12/2022   Dysuria 02/01/2022   Chronic pain 01/31/2022   Edema of lower extremity 01/31/2022   Gastroesophageal reflux disease without esophagitis 01/31/2022   Mixed hyperlipidemia 01/31/2022   Nausea 01/31/2022   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) 01/31/2022   Sciatica 01/31/2022   Positive colorectal cancer screening using Cologuard test 12/09/2021   Acute sinusitis 10/04/2021   Chronic sinusitis 10/04/2021   Diarrhea 10/04/2021   Encounter for antineoplastic chemotherapy 04/29/2021   Encounter for screening for other viral diseases 04/29/2021   Productive cough 12/13/2020   Upper respiratory infection 12/13/2020   Counseling and coordination of care 05/12/2020   Chronic anticoagulation 08/19/2019   Compression fracture of thoracic spine, non-traumatic, sequela 07/28/2019   Pulmonary  embolism (HCC) 09/11/2018   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   DVT of deep femoral vein, right (HCC) 11/29/2017   Cancer-related pain 07/25/2017   Essential hypertension 05/15/2017   Cough in adult 07/27/2015   Multiple myeloma (HCC) 07/22/2015   Easy fatigability 06/01/2015   Foreign body of right eye 06/01/2015   Rales 1/4 way up posterior chest wall on right side 04/06/2015   Chronic low back pain 02/09/2015    Sleeping difficulty 02/09/2015   Acute left-sided low back pain without sciatica 08/12/2014   Closed compression fracture of thoracic vertebra (HCC) 07/13/2014   Lipoma of abdominal wall 07/13/2014   Slow transit constipation 06/29/2014   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   Aching pain 04/07/2014   Diverticulitis large intestine 03/03/2014   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   S/P autologous bone marrow transplantation (HCC) 03/03/2014   Hypokalemia 01/19/2014   Smoking greater than 40 pack years 12/15/2013   Smoking trying to quit 12/15/2013   Hives of unknown origin 12/08/2013   Abnormal LFTs 12/01/2013   H/O peripheral stem cell transplant (HCC) 10/23/2013   Myeloma (HCC) 04/02/2013   Basal cell carcinoma 05/10/2011   Other seborrheic keratosis 04/20/2011   Encounter for screening colonoscopy 08/30/2010   Encounter for screening for malignant neoplasm of colon 08/30/2010    ONSET DATE: 09/23/2023   REFERRING DIAG: I63.9 (ICD-10-CM) - Stroke, acute, thrombotic (HCC)  THERAPY DIAG:  Aphasia  Cognitive communication deficit  Rationale for Evaluation and Treatment: Rehabilitation  SUBJECTIVE:   SUBJECTIVE STATEMENT: That one got me.  Pt accompanied by: self and significant other  PERTINENT HISTORY: Patient is a 68 y.o. male with PMH: HTN, HLD, multiple myeloma on opioids who presented to the hospital on 09/23/23 with mild apasia. His wife drove him to the ED. MRI of brain showed Multifocal acute ischemia within the left temporal and parietal lobes. Pt was hospitalized 8/17-8/19/25 and discharged home with his wife. He was referred for outpatient SLP therapy by Dr. Ary Cummins.  PAIN:  Are you having pain? No  FALLS: Has patient fallen in last 6 months?  No  LIVING ENVIRONMENT: Lives with: lives with their spouse Lives in: House/apartment  PLOF:  Level of assistance: Independent with ADLs, Independent with IADLs Employment: Retired  PATIENT GOALS: Improve  speech  OBJECTIVE:  Note: Objective measures were completed at Evaluation unless otherwise noted.  DIAGNOSTIC FINDINGS:   MRI BRAIN WITH AND WITHOUT CONTRASTIMPRESSION: 1. Multifocal acute ischemia within the left temporal and parietal lobes. 2. Multifocal hyperintense T2-weighted signal within the cerebral white matter, most commonly due to chronic small vessel disease.   Electronically signed by: Franky Stanford MD 09/23/2023                                                                                                                    Previous Treatment:  Pt reported that his appointment with the interventional radiologist went well. He was given permission to drive and that has improved his spirits. In session, he orally read sentence descriptions with  100% acc and then provided the targeted response with 90% acc. He completed synonym task with 100% acc with min assist and antonym task with 80% acc. Pt cued to use each word in a sentence to help convey meaning. SLP explained that he does not have to have a perfect synonym/antonym for each one, just one that makes sense to the listener. He is encouraged to use these strategies when he is unable to verbalize the desired word in conversation. He was given additional HEP to circle to word that does not belong. Next session, plan to incorporate having him tell short stories to see if he is able to implement use of word finding strategies.  TREATMENT DATE: 10/30/23  Pt completed HEP as assigned and reported some trouble with spelling and writing. In session, he required mi/mod cues for generating synonyms and antonyms. He was encouraged to use the primary word in a sentence to help him think of a synonym (this was challenging). He generated synonyms with 75% acc with mi/mod assist and antonyms with 95% acc. He read a list of 5 words and identified the word that does not belong with 90% acc. He generated a 10-item grocery list without assist and only  errors for spelling (it was identifiable to SLP). He appears to have greater fine motor deficits and spelling difficulties from his second stroke. SLP asked him to write a short letter thanking his wife and he did the same task on the initial evaluation and had greater difficulty today. Pt given reading comprehension paragraphs for HEP. Continue to target goals.   PATIENT EDUCATION: Education details: reading comprehension  Person educated: Patient and Spouse Education method: Explanation, Demonstration, and Handouts Education comprehension: verbalized understanding   GOALS: Goals reviewed with patient? Yes  SHORT TERM GOALS: Target date: 11/06/2023  Pt will implement word-finding strategies with 90% accuracy when unable to verbalize desired word in conversation/functional tasks with min assist. Baseline: 70% Goal status: ONGOING  2.  Pt will describe objects and pictures by providing at least three salient features as judged by clinician with 90% acc when provided min cues.  Baseline: 75% Goal status: ONGOING  3.  Pt will complete moderately complex abstract divergent naming tasks with 90% acc with min cues.  Baseline: mod cues Goal status: ONGOING  4.  Pt will complete high level verbal expression tasks (object description, state function, provide short summary, etc) using short sentences with 90% acc and min assist. Baseline: 70% Goal status: ONGOING  5.  Pt will respond to open-ended questions using a complete sentence with 80% acc and min cues. Baseline:  Goal status: ONGOING  6.  Pt will follow multistep auditory directions with 90% acc when provided min cues. Baseline: mod cues and repetition required Goal status: ONGOING  LONG TERM GOALS: Same as short term goals   ASSESSMENT:  CLINICAL IMPRESSION: (initial evaluation 09/27/23) Patient is a 68 y.o. male who was seen today for a cognitive linguistic evaluation. Pt presents with mild/mod expressive language deficits  and mild receptive language deficits which negatively impacts executive functioning. Pt primarily presents with dysnomia and reduced length of utterances for answering open ended questions. He had difficulty listing hobbies, restaurants he frequents, and other important names. He was able to follow 2-step directions, but breakdowns occurred with lengthier/complex auditory directions. 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 He is motivated to improve his language skills  and has excellent family support.   OBJECTIVE IMPAIRMENTS: include expressive language, receptive language, and aphasia. These impairments are limiting patient from effectively communicating at home and in community. Factors affecting potential to achieve goals and functional outcome are previous level of function and family/community support. Patient will benefit from skilled SLP services to address above impairments and improve overall function.  REHAB POTENTIAL: Excellent  PLAN:  SLP FREQUENCY: 2x/week  SLP DURATION: 4 weeks  PLANNED INTERVENTIONS: Cueing hierachy, Internal/external aids, Functional tasks, Multimodal communication approach, SLP instruction and feedback, Compensatory strategies, Patient/family education, [phone] Treatment of speech (30 or 45 min) , and [id number]- Speech [address], Artic, Phon, Eval Compre, Express   Thank you,  Lamar Candy, CCC-SLP [phone]  Craig Rivera, CCC-SLP 10/30/2023, 3:37 PM

## 2023-10-31 ENCOUNTER — Ambulatory Visit: Admitting: Neuroradiology

## 2023-10-31 ENCOUNTER — Other Ambulatory Visit: Payer: Self-pay

## 2023-10-31 NOTE — Patient Instructions (Signed)
 Visit Information  Thank you for taking time to visit with me today. Please don't hesitate to contact me if I can be of assistance to you before our next scheduled telephone appointment.  Our next appointment is by telephone on 11/07/23 at 1000 am  Following is a copy of your care plan:   Goals Addressed             This Visit's Progress    [facility] Transitions of Care (TOC) Care Plan       Problems:  Recent Hospitalization for treatment of Stroke Knowledge Deficit Related to Stroke and Multiple Myeloma  10/31/23 Patient reports he is doing good.  He has less word finding.  He continues Outpatient ST.    Goal:  Over the next 30 days, the patient will not experience hospital readmission  Interventions:  Transitions of Care: Doctor Visits  - discussed the importance of doctor visits Reviewed with patient signs of stroke: Sudden numbness or weakness in the face, arm, or leg, especially on one side of the body. Sudden confusion, trouble speaking, or difficulty understanding speech. Sudden trouble seeing in one or both eyes. Sudden trouble walking, dizziness, loss of balance, or lack of coordination. Sudden severe headache with no known cause. Call 9-1-1 right away if you or someone else has any of these symptoms.  Oncology:Reviewed upcoming provider appointments and treatment appointments Assessed available transportation to appointments and treatments. Has consistent/reliable transportation: Yes Assessed support system. Has consistent/reliable family or other support: Yes  Patient Self Care Activities:  Call pharmacy for medication refills 3-7 days in advance of running out of medications Call provider office for new concerns or questions  Notify RN Care Manager of TOC call rescheduling needs Participate in Transition of Care Program/Attend TOC scheduled calls Take medications as prescribed    Plan:  The patient has been provided with contact information for the care management  team and has been advised to call with any health related questions or concerns.         Patient verbalizes understanding of instructions and care plan provided today and agrees to view in MyChart. Active MyChart status and patient understanding of how to access instructions and care plan via MyChart confirmed with patient.     The patient has been provided with contact information for the care management team and has been advised to call with any health related questions or concerns.   Please call the care guide team at [phone] if you need to cancel or reschedule your appointment.   Please call the Suicide and Crisis Lifeline: 988 call the [place]  National Suicide Prevention Lifeline: (814)424-9503 or TTY: 5872966852 TTY 519-074-8960) to talk to a trained counselor if you are experiencing a Mental Health or Behavioral Health Crisis or need someone to talk to.  Janisa Labus J. Garald Rhew RN, MSN [facility], [facility] Health RN Care Manager Direct Dial: [phone]  Fax: [phone] Website: [email or web address]

## 2023-10-31 NOTE — Transitions of Care (Post Inpatient/ED Visit) (Signed)
 Transition of Care week 4  Visit Note  10/31/2023  Name: Craig Rivera MRN: [id number]          DOB: 08-22-1955  Situation: Patient enrolled in [facility] 30-day program. Visit completed with patient by telephone.   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  Patient  reports wording finding is better.  CM noted that pauses in speaking less as well. Neurological Comment: Reviewed signs of stroke:  Sudden numbness or weakness in the face, arm, or leg, especially on one side of the body.   Sudden confusion, trouble speaking, or difficulty understanding speech.   Sudden trouble seeing in one or both eyes.   Sudden trouble walking, dizziness, loss of balance, or lack of coordination.   Sudden severe headache with no known cause.  Call 9-1-1 right away if you have any of these symptoms.  HEENT HEENT Symptoms Reported: No symptoms reported      Cardiovascular Cardiovascular Symptoms Reported: No symptoms reported    Respiratory Respiratory Symptoms Reported: No symptoms reported    Endocrine Endocrine Symptoms Reported: No symptoms reported    Gastrointestinal Gastrointestinal Symptoms Reported: No symptoms reported      Genitourinary Genitourinary Symptoms Reported: No symptoms reported    Integumentary Integumentary Symptoms Reported: No symptoms reported    Musculoskeletal Musculoskelatal Symptoms Reviewed: No symptoms  reported   Falls in the past year?: No    Psychosocial Psychosocial Symptoms Reported: No symptoms reported         There were no vitals filed for this visit.  Medications Reviewed Today     Reviewed by Brinleigh Tew, RN (Case Manager) on 10/31/23 at 1007  Med List Status: <None>   Medication Order Taking? Sig Documenting Provider Last Dose Status Informant  acetaminophen  (TYLENOL ) 500 MG tablet [id number] Yes Take 1,000 mg by mouth 2 (two) times daily as needed for moderate pain (pain score 4-6) or headache. [provider]  Active Self, Pharmacy Records, Spouse/Significant Other  apixaban  (ELIQUIS ) 5 MG TABS tablet [id number] Yes Take 1 tablet (5 mg total) by mouth 2 (two) times daily. Elodie Palma, MD  Active Self, Pharmacy Records, Spouse/Significant Other  aspirin  81 MG chewable tablet [id number] Yes Chew 1 tablet (81 mg total) by mouth daily. de Clint Kill, Cortney E, NP  Active   atorvastatin  (LIPITOR) 20 MG tablet [id number] Yes Take 1 tablet (20 mg total) by mouth daily. Elodie Palma, MD  Active Self, Pharmacy Records, Spouse/Significant Other  dextromethorphan-guaiFENesin [facility] DM) 30-600 MG 12hr tablet [id number] Yes Take 1 tablet by mouth every 12 (twelve) hours. [provider]  Active Self, Pharmacy Records, Spouse/Significant Other  fentaNYL  (DURAGESIC ) 75 MCG/HR [id number] Yes Place 1 patch onto the skin every 3 (three) days. [provider]  Active Self, Pharmacy Records, Spouse/Significant Other  levocetirizine (XYZAL) 5 MG tablet [id number] Yes Take 5 mg by mouth daily. [provider]  Active Self, Pharmacy Records, Spouse/Significant Other  Med Note (WARD, ANGELICA G   Thu Oct 04, 2023  6:33 PM)    losartan (COZAAR) 50 MG tablet [id number] Yes Take 50 mg by mouth daily. [provider]  Active   oxyCODONE  (OXY IR/ROXICODONE ) 5 MG immediate release tablet [id number] Yes Take 1 tablet (5 mg total) by mouth every 4 (four)  hours as needed. pain  Patient taking differently: Take 5 mg by mouth every 6 (six) hours as needed for severe pain (pain score 7-10). pain   Letha Truman ORN, NP  Active Self, Pharmacy Records, Spouse/Significant Other           Med Note DRENA, CHUCK KANDICE Schaumann Oct 04, 2023  6:26 PM)    pantoprazole  (PROTONIX ) 40 MG tablet [id number] Yes Take 40 mg by mouth daily. [provider]  Active Self, Pharmacy Records, Spouse/Significant Other  tamsulosin  (FLOMAX ) 0.4 MG CAPS capsule [id number] Yes Take 0.4 mg by mouth at bedtime. [provider]  Active Self, Pharmacy Records, Spouse/Significant Other  valACYclovir (VALTREX) 500 MG tablet [id number] Yes Take 500 mg by mouth 2 (two) times daily. [provider]  Active Self, Pharmacy Records, Spouse/Significant Other  venlafaxine  XR (EFFEXOR -XR) 75 MG 24 hr capsule [id number] Yes Take 75 mg by mouth daily. [provider]  Active Self, Pharmacy Records, Spouse/Significant Other           Med Note DRENA, CHUCK KANDICE Schaumann Oct 04, 2023  6:33 PM)              Goals Addressed             This Visit's Progress    [facility] Transitions of Care (TOC) Care Plan       Problems:  Recent Hospitalization for treatment of Stroke Knowledge Deficit Related to Stroke and Multiple Myeloma  10/31/23 Patient reports he is doing good.  He has less word finding.  He continues Outpatient ST.    Goal:  Over the next 30 days, the patient will not experience hospital readmission  Interventions:  Transitions of Care: Doctor Visits  - discussed the importance of doctor visits Reviewed with patient signs of stroke: Sudden numbness or weakness in the face, arm, or leg, especially on one side of the body. Sudden confusion, trouble speaking, or difficulty understanding speech. Sudden trouble seeing in one or both eyes. Sudden trouble walking, dizziness, loss of balance, or lack of coordination. Sudden severe headache with no known  cause. Call 9-1-1 right away if you or someone else has any of these symptoms.  Oncology:Reviewed upcoming provider appointments and treatment appointments Assessed available transportation to appointments and treatments. Has consistent/reliable transportation: Yes Assessed support system. Has consistent/reliable family or other support: Yes  Patient Self Care Activities:  Call pharmacy for medication refills 3-7 days in advance of running out of medications Call provider office for new concerns or questions  Notify RN Care Manager of TOC call rescheduling needs Participate in Transition of Care Program/Attend TOC scheduled calls Take medications as prescribed    Plan:  The patient has been provided with contact information for the care management team and has been advised to call with any health related questions or concerns.         Recommendation:   Continue Current Plan of Care  Follow Up Plan:   Telephone follow-up in 1 week  Avary Eichenberger J. Niyana Chesbro RN, MSN [facility], [facility] Health RN Care Manager Direct Dial: [phone]  Fax: [phone] Website:  [email or web address]

## 2023-11-01 ENCOUNTER — Ambulatory Visit (HOSPITAL_COMMUNITY): Admitting: Speech Pathology

## 2023-11-01 ENCOUNTER — Encounter (HOSPITAL_COMMUNITY): Admitting: Occupational Therapy

## 2023-11-01 DIAGNOSIS — Z5112 Encounter for antineoplastic immunotherapy: Secondary | ICD-10-CM | POA: Diagnosis not present

## 2023-11-01 DIAGNOSIS — C9000 Multiple myeloma not having achieved remission: Secondary | ICD-10-CM | POA: Diagnosis not present

## 2023-11-01 DIAGNOSIS — Z23 Encounter for immunization: Secondary | ICD-10-CM | POA: Diagnosis not present

## 2023-11-01 DIAGNOSIS — Z5111 Encounter for antineoplastic chemotherapy: Secondary | ICD-10-CM | POA: Diagnosis not present

## 2023-11-01 DIAGNOSIS — Z1159 Encounter for screening for other viral diseases: Secondary | ICD-10-CM | POA: Diagnosis not present

## 2023-11-01 DIAGNOSIS — R4701 Aphasia: Secondary | ICD-10-CM

## 2023-11-01 DIAGNOSIS — Z9484 Stem cells transplant status: Secondary | ICD-10-CM | POA: Diagnosis not present

## 2023-11-01 DIAGNOSIS — R41841 Cognitive communication deficit: Secondary | ICD-10-CM

## 2023-11-01 NOTE — Telephone Encounter (Signed)
 Patient no longer needs supplement.

## 2023-11-01 NOTE — Progress Notes (Signed)
 Influenza vaccine given per protocol. Pt tolerated well. Current Influenza VIS (03/09/2023)  given to patient.

## 2023-11-01 NOTE — Progress Notes (Signed)
 [name] [address] [place]. [place] [address] [phone]       HOSPITAL FOLLOW UP NOTE  [name] Date of Birth:  07/17/1955 Medical Record Number:  [id number]   Reason for Referral:  hospital stroke follow up    SUBJECTIVE:   CHIEF COMPLAINT:  Chief Complaint  Patient presents with   RM 8     Patient is here with wife for an hospital follow-up     HPI:   Craig Rivera is a 68 y.o. male with hx of DVT on Xarelto, GERD, hypertension, hyperlipidemia, multiple myeloma on opioids who presented to ED on 09/23/2023 with mild aphasia. Stroke work up revealed L MCA infarcts with L MCA occlusion at the origin, etiology cardioembolic source vs hypercoagulable state/chemo medications.  Further workup as noted below and pertinent imaging.  Patient was on Xarelto PTA and was switched to Eliquis  5 mg twice daily.  Chemotherapy on hold due to potential thromboembolic effect of pomalidomide  and advised outpatient follow-up with oncologist at [facility] ASAP after discharge.  LDL 87, initiated atorvastatin  20 mg daily.  Continued on home antihypertensives and advise avoidance of hypotension due to left MCA occlusion.  Tobacco cessation counseling provided.  He was discharged home with recommendation of outpatient therapies.  Patient returned to ED on 10/04/2023 for worsening aphasia and right facial droop.  Stroke workup revealed mild extension of recent left MCA territory stroke, etiology possibly due to left MCA occlusion with failure of collaterals and possible fluctuation of blood pressure at home.  Recommended BP goal 130-140s.  He was placed on aspirin  81 mg daily in addition to Eliquis .  Repeat LDL 41 and continue atorvastatin  20 mg daily.  Still advised to hold on chemo medications until oncologist follow-up.  He was discharged home with continuation of outpatient speech therapy.    Today, 11/05/2023, patient is being seen for initial hospital follow-up accompanied  by his wife.  Reports continued issues with expressive aphasia but has been gradually improving.  He continues to work with speech therapy. Wife has noticed some occasional slurring of speech.  Denies any weakness, gait changes, or dysphagia.  No new stroke/TIA symptoms.  He remains on aspirin  without side effects. He is not currently on atorvastatin  as he was unable to get refills after he completed 30 day rx from the hospital. Reports no issues with this while taking. He reports oncology discontinued Eliquis  as he is no longer on pomalidomide , previously on DOAC for DVT approx 5 years ago. He believes he had a cardiac monitor completed but was a long time ago.  He is currently waiting to be set up for sleep study through [facility] to rule out sleep apnea.  He routinely follows with PCP Dr. Shona.  No questions or concerns at this time.      PERTINENT IMAGING  Admission 09/25/2023 MRI multifocal acute ischemia within the left temporal and parietal lobes.  chronic small vessel disease. MRA head left middle cerebral artery occlusion at its origin. MRA neck unremarkable 2D Echo LVEF 55%  LDL 81 HgbA1c 5.1  Admission 10/04/2023 CT head evolving subacute left MCA infarcts with largest in temporoparietal junction region appearing more confluent and larger than on prior MRI CTA head & neck unchanged occlusion of proximal left M1 segment with moderate to robust distal collateralization, mild right M1 stenosis MRI some extension of posterior left hemispheric infarcts with mild petechial hemorrhage 2D Echo EF 55% LDL 81->41 HgbA1c 5.1     ROS:  14 system review of systems performed and negative with exception of those listed in HPI  PMH:  Past Medical History:  Diagnosis Date   Back pain    DVT (deep venous thrombosis) (HCC)    GERD (gastroesophageal reflux disease)    HTN (hypertension)    Hypercholesterolemia    Multiple myeloma (HCC)    Sciatic nerve pain     PSH:  Past Surgical  History:  Procedure Laterality Date   BONE MARROW TRANSPLANT     CATARACT EXTRACTION W/PHACO Right 08/11/2014   Procedure: CATARACT EXTRACTION PHACO AND INTRAOCULAR LENS PLACEMENT (IOC);  Surgeon: Dow JULIANNA Burke, MD;  Location: [facility];  Service: Ophthalmology;  Laterality: Right;  CDE 4.88   CATARACT EXTRACTION W/PHACO Left 10/26/2014   Procedure: CATARACT EXTRACTION PHACO AND INTRAOCULAR LENS PLACEMENT LEFT EYE CDE=8.64;  Surgeon: Dow JULIANNA Burke, MD;  Location: [facility];  Service: Ophthalmology;  Laterality: Left;   COLONOSCOPY  09/12/2010   Procedure: COLONOSCOPY;  Surgeon: Lamar CHRISTELLA Hollingshead, MD;  Location: [facility];  Service: Endoscopy;  Laterality: N/A;   COLONOSCOPY WITH PROPOFOL  N/A 01/06/2022   Procedure: COLONOSCOPY WITH PROPOFOL ;  Surgeon: Cindie Carlin POUR, DO;  Location: [facility];  Service: Endoscopy;  Laterality: N/A;  10:00am   inguinal hernia repair bilateral     as child    KNEE SURGERY Right    arthroscopy   POLYPECTOMY  01/06/2022   Procedure: POLYPECTOMY;  Surgeon: Cindie Carlin POUR, DO;  Location: [facility];  Service: Endoscopy;;   TONSILLECTOMY      Social History:  Social History   Socioeconomic History   Marital status: Married    Spouse name: Not on file   Number of children: Not on file   Years of education: Not on file   Highest education level: Not on file  Occupational History   Not on file  Tobacco Use   Smoking status: Every Day    Current packs/day: 0.50    Average packs/day: 0.5 packs/day for 40.0 years (20.0 ttl pk-yrs)    Types: Cigarettes   Smokeless tobacco: Never  Vaping Use   Vaping status: Never Used  Substance and Sexual Activity   Alcohol use: No   Drug use: No   Sexual activity: Yes    Birth control/protection: None  Other Topics Concern   Not on file  Social History Narrative   2 cups of soda in a day and sometimes a cup of coffee or tea    Social Drivers of Corporate investment banker Strain: Low Risk  (04/25/2021)    Received from [facility]   Overall Financial Resource Strain (CARDIA)    Difficulty of Paying Living Expenses: Not hard at all  Food Insecurity: No Food Insecurity (10/09/2023)   Hunger Vital Sign    Worried About Running Out of Food in the Last Year: Never true    Ran Out of Food in the Last Year: Never true  Transportation Needs: No Transportation Needs (10/09/2023)   PRAPARE - Administrator, Civil Service (Medical): No    Lack of Transportation (Non-Medical): No  Physical Activity: Inactive (04/19/2021)   Received from [facility]   Exercise Vital Sign    On average, how many days per week do you engage in moderate to strenuous exercise (like a brisk walk)?: 0 days    On average, how many minutes do you engage in exercise at this level?: 0 min  Stress: No Stress  Concern Present (04/25/2021)   Received from [facility] - Occupational Stress Questionnaire    Feeling of Stress : Not at all  Social Connections: Socially Integrated (10/05/2023)   Social Connection and Isolation Panel    Frequency of Communication with Friends and Family: More than three times a week    Frequency of Social Gatherings with Friends and Family: More than three times a week    Attends Religious Services: More than 4 times per year    Active Member of [employer] or Organizations: Yes    Attends Engineer, structural: More than 4 times per year    Marital Status: Married  Catering manager Violence: Not At Risk (10/09/2023)   Humiliation, Afraid, Rape, and Kick questionnaire    Fear of Current or Ex-Partner: No    Emotionally Abused: No    Physically Abused: No    Sexually Abused: No    Family History:  Family History  Problem Relation Age of Onset   Colon polyps Father        passed away, [place]zhemiers   Colon polyps Sister    Migraines Mother        deceased    Medications:   Current Outpatient Medications on File Prior to Visit   Medication Sig Dispense Refill   acetaminophen  (TYLENOL ) 500 MG tablet Take 1,000 mg by mouth 2 (two) times daily as needed for moderate pain (pain score 4-6) or headache.     aspirin  81 MG chewable tablet Chew 1 tablet (81 mg total) by mouth daily. 30 tablet 2   fentaNYL  (DURAGESIC ) 75 MCG/HR Place 1 patch onto the skin every 3 (three) days.     levocetirizine (XYZAL) 5 MG tablet Take 5 mg by mouth daily.     losartan (COZAAR) 50 MG tablet Take 50 mg by mouth daily.     oxyCODONE  (OXY IR/ROXICODONE ) 5 MG immediate release tablet Take 1 tablet (5 mg total) by mouth every 4 (four) hours as needed. pain (Patient taking differently: Take 5 mg by mouth every 6 (six) hours as needed for severe pain (pain score 7-10). pain) 30 tablet 0   pantoprazole  (PROTONIX ) 40 MG tablet Take 40 mg by mouth daily.     tamsulosin  (FLOMAX ) 0.4 MG CAPS capsule Take 0.4 mg by mouth at bedtime.     valACYclovir (VALTREX) 500 MG tablet Take 500 mg by mouth 2 (two) times daily.     venlafaxine  XR (EFFEXOR -XR) 75 MG 24 hr capsule Take 75 mg by mouth daily.     apixaban  (ELIQUIS ) 5 MG TABS tablet Take 1 tablet (5 mg total) by mouth 2 (two) times daily. (Patient not taking: Reported on 11/05/2023) 60 tablet 0   atorvastatin  (LIPITOR) 20 MG tablet Take 1 tablet (20 mg total) by mouth daily. (Patient not taking: Reported on 11/05/2023) 30 tablet 0   dextromethorphan-guaiFENesin (MUCINEX DM) 30-600 MG 12hr tablet Take 1 tablet by mouth every 12 (twelve) hours. (Patient not taking: Reported on 11/05/2023)     No current facility-administered medications on file prior to visit.    Allergies:   Allergies  Allergen Reactions   Ceclor [Cefaclor] Diarrhea and Rash   Penicillins Diarrhea   Revlimid [Lenalidomide] Rash   Sudafed [Pseudoephedrine Hcl] Rash      OBJECTIVE:  Physical Exam  Vitals:   11/05/23 1003  BP: 126/84  Pulse: 71  SpO2: 96%  Weight: 213 lb 6.4 oz (96.8 kg)  Height: 5' 10 (  1.778 m)   Body mass  index is 30.62 kg/m. No results found.   General: well developed, well nourished, very pleasant middle-age Caucasian male, seated, in no evident distress  Neurologic Exam Mental Status: Awake and fully alert.  Moderate expressive aphasia and very mild dysarthria.  No evidence of receptive aphasia.  Able to follow commands without difficulty.  Oriented to place and time. Recent and remote memory intact. Attention span, concentration and fund of knowledge appropriate. Mood and affect appropriate.  Cranial Nerves: Pupils equal, briskly reactive to light. Extraocular movements full without nystagmus. Visual fields full to confrontation. Hearing intact. Facial sensation intact.  Mild right nasolabial fold flattening.  Tongue, palate moves normally and symmetrically.  Motor: Normal bulk and tone. Normal strength in all tested extremity muscles Sensory.: intact to touch , pinprick , position and vibratory sensation.  Coordination: Rapid alternating movements normal in all extremities. Finger-to-nose and heel-to-shin performed accurately bilaterally. Gait and Station: Arises from chair without difficulty. Stance is normal. Gait demonstrates normal stride length and balance without use of AD.  Reflexes: 1+ and symmetric. Toes downgoing.     NIHSS  3 Modified Rankin  2      ASSESSMENT: Craig Rivera is a 68 y.o. year old male with left MCA territory stroke on 09/23/2023 due to left MCA occlusion possibly cardioembolic source vs hypercoagulable state/chemo medications and evidence of mild extension of left MCA stroke on 10/04/2023 likely due to left MCA occlusion with failure of collaterals and possible fluctuation of blood pressure. Vascular risk factors include HTN, HLD, left MCA occlusion, history of DVT, tobacco use, and on chemotherapy for multiple myeloma.      PLAN:  Left MCA stroke:  Residual deficit: Moderate expressive aphasia and mild dysarthria.  Continue working with SLP for hopeful  ongoing recovery.  Discussed typical recovery time. Continue aspirin  81mg  daily and restart atorvastatin  (Lipitor) for secondary stroke prevention managed/prescribed by PCP.  Short term refill for atorvastatin  provided but advised ongoing refills will need to be obtained by PCP Previously on Xarelto for hx of DVT and was switched to Eliquis  after recent stroke.  Eliquis  has since been discontinued by oncology as pomalidomide  has since been stopped. Discussed pursuing cardiac monitor to rule out A fib as unable to completely rule out cardioembolic source but declines interest at this time.  Telemetry monitoring without evidence of A-fib during above admissions, denies symptoms such as palpitations. He was advised to call if interested in pursing in the future.  Discussed secondary stroke prevention measures and importance of close PCP follow up for aggressive stroke risk factor management including BP goal<130/90 with avoidance of hypotension, and HLD with LDL goal<70.  Stroke labs 09/2023: LDL 41 (down from 81), A1c 5.1 I have gone over the pathophysiology of stroke, warning signs and symptoms, risk factors and their management in some detail with instructions to go to the closest emergency room for symptoms of concern.     No further recommendations from stroke standpoint and is closely being followed by PCP for stroke risk factor management.  He can follow-up here on an as-needed basis but advised to call with any questions or concerns in the future.    CC:  [facility] provider: Dr. Rosemarie PCP: Shona Norleen PEDLAR, MD    I personally spent a total of 56 minutes in the care of the patient today including preparing to see the patient, getting/reviewing separately obtained history, performing a medically appropriate exam/evaluation, counseling and educating, placing orders, and documenting  clinical information in the EHR.  Harlene Bogaert, AGNP-BC  [facility] [address] [place], [place] [address]  Phone [phone] Fax [phone] Note: This document was prepared with digital dictation and possible smart phrase technology. Any transcriptional errors that result from this process are unintentional.

## 2023-11-01 NOTE — Therapy (Signed)
 OUTPATIENT SPEECH LANGUAGE PATHOLOGY TREATMENT   Patient Name: Craig Rivera MRN: [id number] DOB:09-Jun-1955, 68 y.o., male Today's Date: 11/01/2023  PCP: Shona Norleen PEDLAR, MD REFERRING PROVIDER: Jerri Pfeiffer, MD  END OF SESSION:  End of Session - 11/01/23 0944     Visit Number 9    Number of Visits 18    Date for Recertification  12/06/23    Authorization Type [employer]   eff:02/07/23 oop: copay 5 auth no sw jacquline ref # [id number]   SLP Start Time 0930    SLP Stop Time  1015    SLP Time Calculation (min) 45 min    Activity Tolerance Patient tolerated treatment well          Past Medical History:  Diagnosis Date   Back pain    DVT (deep venous thrombosis) (HCC)    GERD (gastroesophageal reflux disease)    HTN (hypertension)    Hypercholesterolemia    Multiple myeloma (HCC)    Sciatic nerve pain    Past Surgical History:  Procedure Laterality Date   BONE MARROW TRANSPLANT     CATARACT EXTRACTION W/PHACO Right 08/11/2014   Procedure: CATARACT EXTRACTION PHACO AND INTRAOCULAR LENS PLACEMENT (IOC);  Surgeon: Dow JULIANNA Burke, MD;  Location: [facility];  Service: Ophthalmology;  Laterality: Right;  CDE 4.88   CATARACT EXTRACTION W/PHACO Left 10/26/2014   Procedure: CATARACT EXTRACTION PHACO AND INTRAOCULAR LENS PLACEMENT LEFT EYE CDE=8.64;  Surgeon: Dow JULIANNA Burke, MD;  Location: [facility];  Service: Ophthalmology;  Laterality: Left;   COLONOSCOPY  09/12/2010   Procedure: COLONOSCOPY;  Surgeon: Lamar CHRISTELLA Hollingshead, MD;  Location: [facility];  Service: Endoscopy;  Laterality: N/A;   COLONOSCOPY WITH PROPOFOL  N/A 01/06/2022   Procedure: COLONOSCOPY WITH PROPOFOL ;  Surgeon: Cindie Carlin POUR, DO;  Location: [facility];  Service: Endoscopy;  Laterality: N/A;  10:00am   inguinal hernia repair bilateral     as child    KNEE SURGERY Right    arthroscopy   POLYPECTOMY  01/06/2022   Procedure: POLYPECTOMY;  Surgeon: Cindie Carlin POUR, DO;  Location: [facility];  Service:  Endoscopy;;   TONSILLECTOMY     Patient Active Problem List   Diagnosis Date Noted   Ischemic stroke (HCC) 10/16/2023   Acute CVA (cerebrovascular accident) (HCC) 10/04/2023   Acute ischemic left middle cerebral artery (MCA) stroke (HCC) 10/04/2023   Weakness 10/04/2023   Acute ischemic left MCA stroke (HCC) 09/23/2023   Dizziness 05/15/2023   Other specified abnormalities of plasma proteins 11/12/2022   Impaired fasting glucose 11/12/2022   Thrombocytopenic disorder 11/12/2022   Cyst of nasal sinus 05/12/2022   Lower urinary tract symptoms due to benign prostatic hyperplasia 05/12/2022   Obesity 05/12/2022   Thyroid  nodule 05/12/2022   Dysuria 02/01/2022   Chronic pain 01/31/2022   Edema of lower extremity 01/31/2022   Gastroesophageal reflux disease without esophagitis 01/31/2022   Mixed hyperlipidemia 01/31/2022   Nausea 01/31/2022   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) 01/31/2022   Sciatica 01/31/2022   Positive colorectal cancer screening using Cologuard test 12/09/2021   Acute sinusitis 10/04/2021   Chronic sinusitis 10/04/2021   Diarrhea 10/04/2021   Encounter for antineoplastic chemotherapy 04/29/2021   Encounter for screening for other viral diseases 04/29/2021   Productive cough 12/13/2020   Upper respiratory infection 12/13/2020   Counseling and coordination of care 05/12/2020   Chronic anticoagulation 08/19/2019   Compression fracture of thoracic spine, non-traumatic, sequela 07/28/2019   Pulmonary  embolism (HCC) 09/11/2018   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   DVT of deep femoral vein, right (HCC) 11/29/2017   Cancer-related pain 07/25/2017   Essential hypertension 05/15/2017   Cough in adult 07/27/2015   Multiple myeloma (HCC) 07/22/2015   Easy fatigability 06/01/2015   Foreign body of right eye 06/01/2015   Rales 1/4 way up posterior chest wall on right side 04/06/2015   Chronic low back pain 02/09/2015    Sleeping difficulty 02/09/2015   Acute left-sided low back pain without sciatica 08/12/2014   Closed compression fracture of thoracic vertebra (HCC) 07/13/2014   Lipoma of abdominal wall 07/13/2014   Slow transit constipation 06/29/2014   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   Aching pain 04/07/2014   Diverticulitis large intestine 03/03/2014   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   S/P autologous bone marrow transplantation (HCC) 03/03/2014   Hypokalemia 01/19/2014   Smoking greater than 40 pack years 12/15/2013   Smoking trying to quit 12/15/2013   Hives of unknown origin 12/08/2013   Abnormal LFTs 12/01/2013   H/O peripheral stem cell transplant (HCC) 10/23/2013   Myeloma (HCC) 04/02/2013   Basal cell carcinoma 05/10/2011   Other seborrheic keratosis 04/20/2011   Encounter for screening colonoscopy 08/30/2010   Encounter for screening for malignant neoplasm of colon 08/30/2010    ONSET DATE: 09/23/2023   REFERRING DIAG: I63.9 (ICD-10-CM) - Stroke, acute, thrombotic (HCC)  THERAPY DIAG:  Aphasia  Cognitive communication deficit  Rationale for Evaluation and Treatment: Rehabilitation  SUBJECTIVE:   SUBJECTIVE STATEMENT: I've been napping for about 30 minutes.  Pt accompanied by: self and significant other  PERTINENT HISTORY: Patient is a 68 y.o. male with PMH: HTN, HLD, multiple myeloma on opioids who presented to the hospital on 09/23/23 with mild aphasia. His wife drove him to the ED. MRI of brain showed Multifocal acute ischemia within the left temporal and parietal lobes. Pt was hospitalized 8/17-8/19/25 and discharged home with his wife. He was referred for outpatient SLP therapy by Dr. Ary Cummins. Addendum: Pt was readmitted to [facility] with another stroke on 10/04/2023 with exacerbation of aphasia.  PAIN:  Are you having pain? No  FALLS: Has patient fallen in last 6 months?  No  LIVING ENVIRONMENT: Lives with: lives with their spouse Lives in:  House/apartment  PLOF:  Level of assistance: Independent with ADLs, Independent with IADLs Employment: Retired  PATIENT GOALS: Improve speech  OBJECTIVE:  Note: Objective measures were completed at Evaluation unless otherwise noted.  DIAGNOSTIC FINDINGS:   MRI BRAIN WITH AND WITHOUT CONTRASTIMPRESSION: 1. Multifocal acute ischemia within the left temporal and parietal lobes. 2. Multifocal hyperintense T2-weighted signal within the cerebral white matter, most commonly due to chronic small vessel disease.   Electronically signed by: Franky Stanford MD 09/23/2023                                                                                                                    Previous Treatment:  Pt reported that his appointment with the interventional radiologist went well. He  was given permission to drive and that has improved his spirits. In session, he orally read sentence descriptions with 100% acc and then provided the targeted response with 90% acc. He completed synonym task with 100% acc with min assist and antonym task with 80% acc. Pt cued to use each word in a sentence to help convey meaning. SLP explained that he does not have to have a perfect synonym/antonym for each one, just one that makes sense to the listener. He is encouraged to use these strategies when he is unable to verbalize the desired word in conversation. He was given additional HEP to circle to word that does not belong. Next session, plan to incorporate having him tell short stories to see if he is able to implement use of word finding strategies.  Previous Treatment: Pt completed HEP as assigned and reported some trouble with spelling and writing. In session, he required mi/mod cues for generating synonyms and antonyms. He was encouraged to use the primary word in a sentence to help him think of a synonym (this was challenging). He generated synonyms with 75% acc with mi/mod assist and antonyms with 95% acc. He read a  list of 5 words and identified the word that does not belong with 90% acc. He generated a 10-item grocery list without assist and only errors for spelling (it was identifiable to SLP). He appears to have greater fine motor deficits and spelling difficulties from his second stroke. SLP asked him to write a short letter thanking his wife and he did the same task on the initial evaluation and had greater difficulty today. Pt given reading comprehension paragraphs for HEP. Continue to target goals.  TREATMENT DATE: 11/01/23  [place] did not complete his HEP and didn't really have an excuse for not completing. He was encouraged to work on it a little bit every day to help with carryover of treatment activities from our sessions. He completed paragraph reading comprehension task in session with 100% acc with multiple choice format and was then able to retell the story via summary to SLP with 100% acc. He was then asked to complete auditory comprehension task of SLP read paragraph and he completed with 80% acc with min cues. He had difficulty identifying item that does not belong in abstract convergent naming and required mod assist. He was encouraged to use each word in a phrase or sentence to help him see the difference in meaning. He provided salient features of words when describing to SLP with 75% acc. He has greater difficulty completing tasks which require abstract naming in conversation. He has made excellent progress toward goals, however his second stroke 10/04/23 did exacerbate expressive aphasia and writing. Goals have been updated below and will plan for another month of therapy to address goals. Pt is in agreement with plan of care.   PATIENT EDUCATION: Education details: reading comprehension  Person educated: Patient and Spouse Education method: Explanation, Demonstration, and Handouts Education comprehension: verbalized understanding   GOALS: Goals reviewed with patient? Yes  SHORT TERM GOALS:  Target date: 11/06/2023  Pt will implement word-finding strategies with 90% accuracy when unable to verbalize desired word in conversation/functional tasks with min assist. Baseline: 70% Goal status: Partially Met at 80%; continue goal  2.  Pt will describe objects and pictures by providing at least three salient features as judged by clinician with 90% acc when provided min cues.  Baseline: 75% Goal status: Partially Met; continue  3.  Pt will complete moderately complex abstract  divergent naming tasks with 90% acc with min cues.  Baseline: mod cues Goal status: Partially Met; continue  4.  Pt will complete high level verbal expression tasks (object description, state function, provide short summary, etc) using short sentences with 90% acc and min assist. Baseline: 70% Goal status: Partially Met; continue  5.  Pt will respond to open-ended questions using a complete sentence with 80% acc and min cues. Baseline: 75% Goal status: Met  6.  Pt will follow multistep auditory directions with 90% acc when provided min cues. Baseline: mod cues and repetition required Goal status: Met  7.  Pt will write short messages and lists with 90% accuracy for overall intelligibility with allowance for minor spelling mistakes. Baseline: 75% Goal status: NEW  LONG TERM GOALS: Same as short term goals   ASSESSMENT:  CLINICAL IMPRESSION: (initial evaluation 09/27/23) Patient is a 68 y.o. male who was seen today for a cognitive linguistic evaluation. Pt presents with mild/mod expressive language deficits and mild receptive language deficits which negatively impacts executive functioning. Pt primarily presents with dysnomia and reduced length of utterances for answering open ended questions. He had difficulty listing hobbies, restaurants he frequents, and other important names. He was able to follow 2-step directions, but breakdowns occurred with lengthier/complex auditory directions. 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 He is motivated to improve his language skills and has excellent family support.   OBJECTIVE IMPAIRMENTS: include expressive language, receptive language, and aphasia. These impairments are limiting patient from effectively communicating at home and in community. Factors affecting potential to achieve goals and functional outcome are previous level of function and family/community support. Patient will benefit from skilled SLP services to address above impairments and improve overall function.  REHAB POTENTIAL: Excellent  PLAN:  SLP FREQUENCY: 2x/week  SLP DURATION: 4 weeks  PLANNED INTERVENTIONS: Cueing hierachy, Internal/external aids, Functional tasks, Multimodal communication approach, SLP instruction and feedback, Compensatory strategies, Patient/family education, [phone] Treatment of speech (30 or 45 min) , and [id number]- Speech [phone] Artic, Phon, Eval Compre, Express   Thank you,  Lamar Candy, CCC-SLP [phone]  Joli Koob, CCC-SLP 11/01/2023, 12:35 PM

## 2023-11-05 ENCOUNTER — Encounter: Payer: Self-pay | Admitting: Adult Health

## 2023-11-05 ENCOUNTER — Ambulatory Visit: Admitting: Adult Health

## 2023-11-05 VITALS — BP 126/84 | HR 71 | Ht 70.0 in | Wt 213.4 lb

## 2023-11-05 DIAGNOSIS — I63412 Cerebral infarction due to embolism of left middle cerebral artery: Secondary | ICD-10-CM | POA: Diagnosis not present

## 2023-11-05 DIAGNOSIS — I639 Cerebral infarction, unspecified: Secondary | ICD-10-CM

## 2023-11-05 DIAGNOSIS — R4701 Aphasia: Secondary | ICD-10-CM | POA: Diagnosis not present

## 2023-11-05 MED ORDER — ATORVASTATIN CALCIUM 20 MG PO TABS: 20.0000 mg | ORAL_TABLET | Freq: Every day | ORAL | 0 refills | Status: AC

## 2023-11-05 NOTE — Patient Instructions (Signed)
 Continue working with speech therapy for likely ongoing recovery - keep up the good work!   Continue routine follow up with oncology as scheduled  Continue aspirin  81 mg daily  and restart atorvastatin  (Lipitor)  for secondary stroke prevention  Continue to follow up with PCP regarding blood pressure and cholesterol management  Maintain strict control of hypertension with blood pressure goal below 130/90 and cholesterol with LDL cholesterol (bad cholesterol) goal below 70 mg/dL.   Signs of a Stroke? Follow the BEFAST method:  Balance Watch for a sudden loss of balance, trouble with coordination or vertigo Eyes Is there a sudden loss of vision in one or both eyes? Or double vision?  Face: Ask the person to smile. Does one side of the face droop or is it numb?  Arms: Ask the person to raise both arms. Does one arm drift downward? Is there weakness or numbness of a leg? Speech: Ask the person to repeat a simple phrase. Does the speech sound slurred/strange? Is the person confused ? Time: If you observe any of these signs, call 911.        Thank you for coming to see us  at [facility]. I hope we have been able to provide you high quality care today.  You may receive a patient satisfaction survey over the next few weeks. We would appreciate your feedback and comments so that we may continue to improve ourselves and the health of our patients.

## 2023-11-06 ENCOUNTER — Ambulatory Visit (HOSPITAL_COMMUNITY): Admitting: Speech Pathology

## 2023-11-06 ENCOUNTER — Encounter (HOSPITAL_COMMUNITY): Admitting: Occupational Therapy

## 2023-11-06 ENCOUNTER — Encounter (HOSPITAL_COMMUNITY): Payer: Self-pay | Admitting: Speech Pathology

## 2023-11-06 ENCOUNTER — Telehealth

## 2023-11-06 DIAGNOSIS — R4701 Aphasia: Secondary | ICD-10-CM

## 2023-11-06 DIAGNOSIS — R41841 Cognitive communication deficit: Secondary | ICD-10-CM

## 2023-11-06 NOTE — Therapy (Signed)
 OUTPATIENT SPEECH LANGUAGE PATHOLOGY TREATMENT   Patient Name: Craig Rivera MRN: [id number] DOB:Jun 18, 1955, 68 y.o., male Today's Date: 11/06/2023  PCP: Shona Norleen PEDLAR, MD REFERRING PROVIDER: Jerri Pfeiffer, MD  END OF SESSION:  End of Session - 11/06/23 1342     Visit Number 10    Number of Visits 18    Date for Recertification  12/06/23    Authorization Type [employer]   eff:02/07/23 oop: copay 5 auth no sw jacquline ref # [id number]   SLP Start Time 1334    SLP Stop Time  1419    SLP Time Calculation (min) 45 min    Activity Tolerance Patient tolerated treatment well          Past Medical History:  Diagnosis Date   Back pain    DVT (deep venous thrombosis) (HCC)    GERD (gastroesophageal reflux disease)    HTN (hypertension)    Hypercholesterolemia    Multiple myeloma (HCC)    Sciatic nerve pain    Past Surgical History:  Procedure Laterality Date   BONE MARROW TRANSPLANT     CATARACT EXTRACTION W/PHACO Right 08/11/2014   Procedure: CATARACT EXTRACTION PHACO AND INTRAOCULAR LENS PLACEMENT (IOC);  Surgeon: Dow JULIANNA Burke, MD;  Location: [facility];  Service: Ophthalmology;  Laterality: Right;  CDE 4.88   CATARACT EXTRACTION W/PHACO Left 10/26/2014   Procedure: CATARACT EXTRACTION PHACO AND INTRAOCULAR LENS PLACEMENT LEFT EYE CDE=8.64;  Surgeon: Dow JULIANNA Burke, MD;  Location: [facility];  Service: Ophthalmology;  Laterality: Left;   COLONOSCOPY  09/12/2010   Procedure: COLONOSCOPY;  Surgeon: Lamar CHRISTELLA Hollingshead, MD;  Location: [facility];  Service: Endoscopy;  Laterality: N/A;   COLONOSCOPY WITH PROPOFOL  N/A 01/06/2022   Procedure: COLONOSCOPY WITH PROPOFOL ;  Surgeon: Cindie Carlin POUR, DO;  Location: [facility];  Service: Endoscopy;  Laterality: N/A;  10:00am   inguinal hernia repair bilateral     as child    KNEE SURGERY Right    arthroscopy   POLYPECTOMY  01/06/2022   Procedure: POLYPECTOMY;  Surgeon: Cindie Carlin POUR, DO;  Location: [facility];  Service:  Endoscopy;;   TONSILLECTOMY     Patient Active Problem List   Diagnosis Date Noted   Ischemic stroke (HCC) 10/16/2023   Acute CVA (cerebrovascular accident) (HCC) 10/04/2023   Acute ischemic left middle cerebral artery (MCA) stroke (HCC) 10/04/2023   Weakness 10/04/2023   Acute ischemic left MCA stroke (HCC) 09/23/2023   Dizziness 05/15/2023   Other specified abnormalities of plasma proteins 11/12/2022   Impaired fasting glucose 11/12/2022   Thrombocytopenic disorder 11/12/2022   Cyst of nasal sinus 05/12/2022   Lower urinary tract symptoms due to benign prostatic hyperplasia 05/12/2022   Obesity 05/12/2022   Thyroid  nodule 05/12/2022   Dysuria 02/01/2022   Chronic pain 01/31/2022   Edema of lower extremity 01/31/2022   Gastroesophageal reflux disease without esophagitis 01/31/2022   Mixed hyperlipidemia 01/31/2022   Nausea 01/31/2022   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) 01/31/2022   Sciatica 01/31/2022   Positive colorectal cancer screening using Cologuard test 12/09/2021   Acute sinusitis 10/04/2021   Chronic sinusitis 10/04/2021   Diarrhea 10/04/2021   Encounter for antineoplastic chemotherapy 04/29/2021   Encounter for screening for other viral diseases 04/29/2021   Productive cough 12/13/2020   Upper respiratory infection 12/13/2020   Counseling and coordination of care 05/12/2020   Chronic anticoagulation 08/19/2019   Compression fracture of thoracic spine, non-traumatic, sequela 07/28/2019   Pulmonary  embolism (HCC) 09/11/2018   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   DVT of deep femoral vein, right (HCC) 11/29/2017   Cancer-related pain 07/25/2017   Essential hypertension 05/15/2017   Cough in adult 07/27/2015   Multiple myeloma (HCC) 07/22/2015   Easy fatigability 06/01/2015   Foreign body of right eye 06/01/2015   Rales 1/4 way up posterior chest wall on right side 04/06/2015   Chronic low back pain 02/09/2015    Sleeping difficulty 02/09/2015   Acute left-sided low back pain without sciatica 08/12/2014   Closed compression fracture of thoracic vertebra (HCC) 07/13/2014   Lipoma of abdominal wall 07/13/2014   Slow transit constipation 06/29/2014   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   Aching pain 04/07/2014   Diverticulitis large intestine 03/03/2014   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   S/P autologous bone marrow transplantation (HCC) 03/03/2014   Hypokalemia 01/19/2014   Smoking greater than 40 pack years 12/15/2013   Smoking trying to quit 12/15/2013   Hives of unknown origin 12/08/2013   Abnormal LFTs 12/01/2013   H/O peripheral stem cell transplant (HCC) 10/23/2013   Myeloma (HCC) 04/02/2013   Basal cell carcinoma 05/10/2011   Other seborrheic keratosis 04/20/2011   Encounter for screening colonoscopy 08/30/2010   Encounter for screening for malignant neoplasm of colon 08/30/2010    ONSET DATE: 09/23/2023   REFERRING DIAG: I63.9 (ICD-10-CM) - Stroke, acute, thrombotic (HCC)  THERAPY DIAG:  Aphasia  Cognitive communication deficit  Rationale for Evaluation and Treatment: Rehabilitation  SUBJECTIVE:   SUBJECTIVE STATEMENT: I really don't have many people to talk to during the day.  Pt accompanied by: self and significant other  PERTINENT HISTORY: Patient is a 68 y.o. male with PMH: HTN, HLD, multiple myeloma on opioids who presented to the hospital on 09/23/23 with mild aphasia. His wife drove him to the ED. MRI of brain showed Multifocal acute ischemia within the left temporal and parietal lobes. Pt was hospitalized 8/17-8/19/25 and discharged home with his wife. He was referred for outpatient SLP therapy by Dr. Ary Cummins. Addendum: Pt was readmitted to [facility] with another stroke on 10/04/2023 with exacerbation of aphasia.  PAIN:  Are you having pain? No  FALLS: Has patient fallen in last 6 months?  No  LIVING ENVIRONMENT: Lives with: lives with their spouse Lives in:  House/apartment  PLOF:  Level of assistance: Independent with ADLs, Independent with IADLs Employment: Retired  PATIENT GOALS: Improve speech  OBJECTIVE:  Note: Objective measures were completed at Evaluation unless otherwise noted.  DIAGNOSTIC FINDINGS:   MRI BRAIN WITH AND WITHOUT CONTRASTIMPRESSION: 1. Multifocal acute ischemia within the left temporal and parietal lobes. 2. Multifocal hyperintense T2-weighted signal within the cerebral white matter, most commonly due to chronic small vessel disease.   Electronically signed by: Franky Stanford MD 09/23/2023                                                                                                                    Previous Treatment: [place] did not complete his HEP and didn't  really have an excuse for not completing. He was encouraged to work on it a little bit every day to help with carryover of treatment activities from our sessions. He completed paragraph reading comprehension task in session with 100% acc with multiple choice format and was then able to retell the story via summary to SLP with 100% acc. He was then asked to complete auditory comprehension task of SLP read paragraph and he completed with 80% acc with min cues. He had difficulty identifying item that does not belong in abstract convergent naming and required mod assist. He was encouraged to use each word in a phrase or sentence to help him see the difference in meaning. He provided salient features of words when describing to SLP with 75% acc. He has greater difficulty completing tasks which require abstract naming in conversation. He has made excellent progress toward goals, however his second stroke 10/04/23 did exacerbate expressive aphasia and writing. Goals have been updated below and will plan for another month of therapy to address goals. Pt is in agreement with plan of care.   TREATMENT DATE: 11/06/23  Pt states that he went to his neurology appointment last week  and doesn't have to follow up with them again. He reports no changes, not much talking during the day because he is often by himself. He enjoys watching You Tube videos about westerns and he was encouraged to watch a video and then tell his wife about what he watched when she returns from work. In session, he initiated a conversation regarding [employer] and his dismay regarding perceived injustice within the league. He had difficulty finding his words and SLP facilitated the conversation by writing down key words on the dry erase board and encouraging expansion of utterances. He wrote a 10-item grocery list to dictation with 95% acc (minor spelling) and then wrote a self generated list of 5 items for a person grocery list with 100% acc. He was asked to create sentences with a given word and was able to do so with 100% acc with allowance for extra time and we will use these next session to create synonyms for the targeted words. Pt continues to make good progress toward goals.   PATIENT EDUCATION: Education details: reading comprehension  Person educated: Patient and Spouse Education method: Explanation, Demonstration, and Handouts Education comprehension: verbalized understanding   GOALS: Goals reviewed with patient? Yes  SHORT TERM GOALS: Target date: 12/06/2023  Pt will implement word-finding strategies with 90% accuracy when unable to verbalize desired word in conversation/functional tasks with min assist. Baseline: 70% Goal status: Partially Met at 80%; continue goal  2.  Pt will describe objects and pictures by providing at least three salient features as judged by clinician with 90% acc when provided min cues.  Baseline: 75% Goal status: Partially Met; continue  3.  Pt will complete moderately complex abstract divergent naming tasks with 90% acc with min cues.  Baseline: mod cues Goal status: Partially Met; continue  4.  Pt will complete high level verbal expression tasks (object  description, state function, provide short summary, etc) using short sentences with 90% acc and min assist. Baseline: 70% Goal status: Partially Met; continue  5.  Pt will respond to open-ended questions using a complete sentence with 80% acc and min cues. Baseline: 75% Goal status: Met  6.  Pt will follow multistep auditory directions with 90% acc when provided min cues. Baseline: mod cues and repetition required Goal status: Met  7.  Pt will  write short messages and lists with 90% accuracy for overall intelligibility with allowance for minor spelling mistakes. Baseline: 75% Goal status: ONGOING  LONG TERM GOALS: Same as short term goals   ASSESSMENT:  CLINICAL IMPRESSION: (initial evaluation 09/27/23) Patient is a 68 y.o. male who was seen today for a cognitive linguistic evaluation. Pt presents with mild/mod expressive language deficits and mild receptive language deficits which negatively impacts executive functioning. Pt primarily presents with dysnomia and reduced length of utterances for answering open ended questions. He had difficulty listing hobbies, restaurants he frequents, and other important names. He was able to follow 2-step directions, but breakdowns occurred with lengthier/complex auditory directions. 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 He is motivated to improve his language skills and has excellent family support.   OBJECTIVE IMPAIRMENTS: include expressive language, receptive language, and aphasia. These impairments are limiting patient from effectively communicating at home and in community. Factors affecting potential to achieve goals and functional outcome are previous level of function and family/community support. Patient will benefit from skilled SLP services to address above impairments and improve overall function.  REHAB POTENTIAL:  Excellent  PLAN:  SLP FREQUENCY: 2x/week  SLP DURATION: 4 weeks  PLANNED INTERVENTIONS: Cueing hierachy, Internal/external aids, Functional tasks, Multimodal communication approach, SLP instruction and feedback, Compensatory strategies, Patient/family education, [phone] Treatment of speech (30 or 45 min) , and [id number]- Speech [address], [place], Phon, Eval Compre, Express   Thank you,  Lamar Candy, CCC-SLP [phone]  Jasher Barkan, CCC-SLP 11/06/2023, 1:44 PM

## 2023-11-07 ENCOUNTER — Other Ambulatory Visit: Payer: Self-pay

## 2023-11-07 DIAGNOSIS — Z125 Encounter for screening for malignant neoplasm of prostate: Secondary | ICD-10-CM | POA: Diagnosis not present

## 2023-11-07 DIAGNOSIS — I10 Essential (primary) hypertension: Secondary | ICD-10-CM | POA: Diagnosis not present

## 2023-11-07 NOTE — Transitions of Care (Post Inpatient/ED Visit) (Signed)
 Transition of Care Week 5  Visit Note  11/07/2023  Name: Craig Rivera MRN: [id number]          DOB: [birth year]  Situation: Patient enrolled in [facility] 30-day program. Visit completed with patient by telephone.   Background:   Initial Transition Care Management Follow-up Telephone Call    Past Medical History:  Diagnosis Date   Back pain    DVT (deep venous thrombosis) (HCC)    GERD (gastroesophageal reflux disease)    HTN (hypertension)    Hypercholesterolemia    Multiple myeloma (HCC)    Sciatic nerve pain     Assessment: Patient Reported Symptoms: Cognitive Cognitive Status: Alert and oriented to person, place, and time, Normal speech and language skills      Neurological Neurological Review of Symptoms: No symptoms reported    HEENT HEENT Symptoms Reported: No symptoms reported      Cardiovascular Cardiovascular Symptoms Reported: No symptoms reported    Respiratory Respiratory Symptoms Reported: No symptoms reported    Endocrine Endocrine Symptoms Reported: No symptoms reported    Gastrointestinal Gastrointestinal Symptoms Reported: No symptoms reported      Genitourinary Genitourinary Symptoms Reported: No symptoms reported    Integumentary Integumentary Symptoms Reported: No symptoms reported    Musculoskeletal Musculoskelatal Symptoms Reviewed: No symptoms reported        Psychosocial Psychosocial Symptoms Reported: No symptoms reported         There were no vitals filed for this visit.  Medications Reviewed Today     Reviewed by Tashiana Lamarca, RN (Case Manager) on 11/07/23 at 1004  Med List Status: <None>   Medication Order Taking? Sig Documenting Provider Last Dose Status Informant  acetaminophen  (TYLENOL ) 500 MG tablet [id number] Yes Take 1,000 mg by mouth 2 (two) times daily as needed for moderate pain (pain score 4-6) or headache. [provider]  Active Self, Pharmacy Records, Spouse/Significant Other  aspirin  81 MG chewable tablet  [id number] Yes Chew 1 tablet (81 mg total) by mouth daily. de Clint Kill, Cortney E, NP  Active   atorvastatin  (LIPITOR) 20 MG tablet [id number] Yes Take 1 tablet (20 mg total) by mouth daily. Whitfield Raisin, NP  Active   dextromethorphan-guaiFENesin [facility] DM) 30-600 MG 12hr tablet [id number]  Take 1 tablet by mouth every 12 (twelve) hours.  Patient not taking: Reported on 11/07/2023   [provider]  Active Self, Pharmacy Records, Spouse/Significant Other  fentaNYL  (DURAGESIC ) 75 MCG/HR [id number] Yes Place 1 patch onto the skin every 3 (three) days. [provider]  Active Self, Pharmacy Records, Spouse/Significant Other  levocetirizine (XYZAL) 5 MG tablet [id number] Yes Take 5 mg by mouth daily. [provider]  Active Self, Pharmacy Records, Spouse/Significant Other           Med Note (WARD, ANGELICA G   Thu Oct 04, 2023  6:33 PM)    losartan (COZAAR) 50 MG tablet [id number] Yes Take 50 mg by mouth daily. [provider]  Active   oxyCODONE  (OXY IR/ROXICODONE ) 5 MG immediate release tablet [id number] Yes Take 1 tablet (5 mg total) by mouth every 4 (four) hours as needed. pain  Patient taking differently: Take 5 mg by mouth every 6 (six) hours as needed for severe pain (pain score 7-10). pain   Letha Truman ORN, NP  Active Self, Pharmacy Records, Spouse/Significant Other           Med Note DRENA, CHUCK KANDICE Schaumann Oct 04, 2023  6:26 PM)  pantoprazole  (PROTONIX ) 40 MG tablet [id number] Yes Take 40 mg by mouth daily. [provider]  Active Self, Pharmacy Records, Spouse/Significant Other  tamsulosin  (FLOMAX ) 0.4 MG CAPS capsule [id number] Yes Take 0.4 mg by mouth at bedtime. [provider]  Active Self, Pharmacy Records, Spouse/Significant Other  valACYclovir (VALTREX) 500 MG tablet [id number] Yes Take 500 mg by mouth 2 (two) times daily. [provider]  Active Self, Pharmacy Records, Spouse/Significant Other  venlafaxine  XR (EFFEXOR -XR)  75 MG 24 hr capsule [id number] Yes Take 75 mg by mouth daily. [provider]  Active Self, Pharmacy Records, Spouse/Significant Other           Med Note DRENA, CHUCK KANDICE Schaumann Oct 04, 2023  6:33 PM)              Goals Addressed             This Visit's Progress    COMPLETED: [facility] Transitions of Care (TOC) Care Plan       Problems:  Recent Hospitalization for treatment of Stroke Knowledge Deficit Related to Stroke and Multiple Myeloma  11/07/23 Patient reports he is doing good.  Speak following without hesitation.  Outpatient therapy continues.      Goal:  Over the next 30 days, the patient will not experience hospital readmission  Interventions:  Transitions of Care: Doctor Visits  - discussed the importance of doctor visits Reiterated with patient signs of stroke: Sudden numbness or weakness in the face, arm, or leg, especially on one side of the body. Sudden confusion, trouble speaking, or difficulty understanding speech. Sudden trouble seeing in one or both eyes. Sudden trouble walking, dizziness, loss of balance, or lack of coordination. Sudden severe headache with no known cause. Call 9-1-1 right away if you or someone else has any of these symptoms.  Oncology:Reviewed upcoming provider appointments and treatment appointments Assessed available transportation to appointments and treatments. Has consistent/reliable transportation: Yes Assessed support system. Has consistent/reliable family or other support: Yes  Patient Self Care Activities:  Call pharmacy for medication refills 3-7 days in advance of running out of medications Call provider office for new concerns or questions  Notify RN Care Manager of TOC call rescheduling needs Participate in Transition of Care Program/Attend TOC scheduled calls Take medications as prescribed    Plan:  The patient has been provided with contact information for the care management team and has been advised to call with  any health related questions or concerns.         Recommendation:   Continue Current Plan of Care  Follow Up Plan:   Closing From:  Transitions of Care Program  Seylah Wernert DOROTHA Seeds RN, MSN [place]  [facility], [facility] RN Care Manager Direct Dial: [phone]  Fax: [phone] Website: [email or web address]

## 2023-11-07 NOTE — Patient Instructions (Signed)
 Visit Information  Thank you for taking time to visit with me today.   Following is a copy of your care plan:   Goals Addressed             This Visit's Progress    COMPLETED: [facility] Transitions of Care (TOC) Care Plan       Problems:  Recent Hospitalization for treatment of Stroke Knowledge Deficit Related to Stroke and Multiple Myeloma  11/07/23 Patient reports he is doing good.  Speak following without hesitation.  Outpatient therapy continues.      Goal:  Over the next 30 days, the patient will not experience hospital readmission  Interventions:  Transitions of Care: Doctor Visits  - discussed the importance of doctor visits Reiterated with patient signs of stroke: Sudden numbness or weakness in the face, arm, or leg, especially on one side of the body. Sudden confusion, trouble speaking, or difficulty understanding speech. Sudden trouble seeing in one or both eyes. Sudden trouble walking, dizziness, loss of balance, or lack of coordination. Sudden severe headache with no known cause. Call 9-1-1 right away if you or someone else has any of these symptoms.  Oncology:Reviewed upcoming provider appointments and treatment appointments Assessed available transportation to appointments and treatments. Has consistent/reliable transportation: Yes Assessed support system. Has consistent/reliable family or other support: Yes  Patient Self Care Activities:  Call pharmacy for medication refills 3-7 days in advance of running out of medications Call provider office for new concerns or questions  Notify RN Care Manager of TOC call rescheduling needs Participate in Transition of Care Program/Attend TOC scheduled calls Take medications as prescribed    Plan:  The patient has been provided with contact information for the care management team and has been advised to call with any health related questions or concerns.         Patient verbalizes understanding of instructions and care  plan provided today and agrees to view in MyChart. Active MyChart status and patient understanding of how to access instructions and care plan via MyChart confirmed with patient.     The patient has been provided with contact information for the care management team and has been advised to call with any health related questions or concerns.   Please call the care guide team at [phone] if you need to cancel or reschedule your appointment.   Please call the Suicide and Crisis Lifeline: 988 call the [place]  National Suicide Prevention Lifeline: (210)639-9487 or TTY: 367 356 5840 TTY 713-436-9701) to talk to a trained counselor if you are experiencing a Mental Health or Behavioral Health Crisis or need someone to talk to.  Eliyana Pagliaro J. Shiva Sahagian RN, MSN [facility], [facility] RN Care Manager Direct Dial: [phone]  Fax: [phone] Website: [email or web address]

## 2023-11-08 ENCOUNTER — Encounter (HOSPITAL_COMMUNITY): Admitting: Occupational Therapy

## 2023-11-08 ENCOUNTER — Encounter (HOSPITAL_COMMUNITY): Admitting: Speech Pathology

## 2023-11-08 NOTE — Progress Notes (Signed)
 I agree with the above plan

## 2023-11-12 ENCOUNTER — Encounter (HOSPITAL_COMMUNITY): Payer: Self-pay | Admitting: Speech Pathology

## 2023-11-12 ENCOUNTER — Ambulatory Visit (HOSPITAL_COMMUNITY): Attending: Neurology | Admitting: Speech Pathology

## 2023-11-12 DIAGNOSIS — R4701 Aphasia: Secondary | ICD-10-CM | POA: Insufficient documentation

## 2023-11-12 DIAGNOSIS — R41841 Cognitive communication deficit: Secondary | ICD-10-CM | POA: Insufficient documentation

## 2023-11-12 NOTE — Therapy (Signed)
 OUTPATIENT SPEECH LANGUAGE PATHOLOGY TREATMENT   Patient Name: Craig Rivera MRN: [id number] DOB:1955/06/05, 68 y.o., male Today's Date: 11/12/2023  PCP: Shona Norleen PEDLAR, MD REFERRING PROVIDER: Jerri Pfeiffer, MD  END OF SESSION:  End of Session - 11/12/23 1126     Visit Number 11    Number of Visits 18    Date for Recertification  12/06/23    Authorization Type [employer]   eff:02/07/23 oop: copay 5 auth no sw jacquline ref # [id number]   SLP Start Time 1115    SLP Stop Time  1200    SLP Time Calculation (min) 45 min    Activity Tolerance Patient tolerated treatment well          Past Medical History:  Diagnosis Date   Back pain    DVT (deep venous thrombosis) (HCC)    GERD (gastroesophageal reflux disease)    HTN (hypertension)    Hypercholesterolemia    Multiple myeloma (HCC)    Sciatic nerve pain    Past Surgical History:  Procedure Laterality Date   BONE MARROW TRANSPLANT     CATARACT EXTRACTION W/PHACO Right 08/11/2014   Procedure: CATARACT EXTRACTION PHACO AND INTRAOCULAR LENS PLACEMENT (IOC);  Surgeon: Dow JULIANNA Burke, MD;  Location: [facility];  Service: Ophthalmology;  Laterality: Right;  CDE 4.88   CATARACT EXTRACTION W/PHACO Left 10/26/2014   Procedure: CATARACT EXTRACTION PHACO AND INTRAOCULAR LENS PLACEMENT LEFT EYE CDE=8.64;  Surgeon: Dow JULIANNA Burke, MD;  Location: [facility];  Service: Ophthalmology;  Laterality: Left;   COLONOSCOPY  09/12/2010   Procedure: COLONOSCOPY;  Surgeon: Lamar CHRISTELLA Hollingshead, MD;  Location: [facility];  Service: Endoscopy;  Laterality: N/A;   COLONOSCOPY WITH PROPOFOL  N/A 01/06/2022   Procedure: COLONOSCOPY WITH PROPOFOL ;  Surgeon: Cindie Carlin POUR, DO;  Location: [facility];  Service: Endoscopy;  Laterality: N/A;  10:00am   inguinal hernia repair bilateral     as child    KNEE SURGERY Right    arthroscopy   POLYPECTOMY  01/06/2022   Procedure: POLYPECTOMY;  Surgeon: Cindie Carlin POUR, DO;  Location: [facility];  Service:  Endoscopy;;   TONSILLECTOMY     Patient Active Problem List   Diagnosis Date Noted   Ischemic stroke (HCC) 10/16/2023   Acute CVA (cerebrovascular accident) (HCC) 10/04/2023   Acute ischemic left middle cerebral artery (MCA) stroke (HCC) 10/04/2023   Weakness 10/04/2023   Acute ischemic left MCA stroke (HCC) 09/23/2023   Dizziness 05/15/2023   Other specified abnormalities of plasma proteins 11/12/2022   Impaired fasting glucose 11/12/2022   Thrombocytopenic disorder 11/12/2022   Cyst of nasal sinus 05/12/2022   Lower urinary tract symptoms due to benign prostatic hyperplasia 05/12/2022   Obesity 05/12/2022   Thyroid  nodule 05/12/2022   Dysuria 02/01/2022   Chronic pain 01/31/2022   Edema of lower extremity 01/31/2022   Gastroesophageal reflux disease without esophagitis 01/31/2022   Mixed hyperlipidemia 01/31/2022   Nausea 01/31/2022   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) 01/31/2022   Sciatica 01/31/2022   Positive colorectal cancer screening using Cologuard test 12/09/2021   Acute sinusitis 10/04/2021   Chronic sinusitis 10/04/2021   Diarrhea 10/04/2021   Encounter for antineoplastic chemotherapy 04/29/2021   Encounter for screening for other viral diseases 04/29/2021   Productive cough 12/13/2020   Upper respiratory infection 12/13/2020   Counseling and coordination of care 05/12/2020   Chronic anticoagulation 08/19/2019   Compression fracture of thoracic spine, non-traumatic, sequela 07/28/2019   Pulmonary  embolism (HCC) 09/11/2018   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   DVT of deep femoral vein, right (HCC) 11/29/2017   Cancer-related pain 07/25/2017   Essential hypertension 05/15/2017   Cough in adult 07/27/2015   Multiple myeloma (HCC) 07/22/2015   Easy fatigability 06/01/2015   Foreign body of right eye 06/01/2015   Rales 1/4 way up posterior chest wall on right side 04/06/2015   Chronic low back pain 02/09/2015    Sleeping difficulty 02/09/2015   Acute left-sided low back pain without sciatica 08/12/2014   Closed compression fracture of thoracic vertebra (HCC) 07/13/2014   Lipoma of abdominal wall 07/13/2014   Slow transit constipation 06/29/2014   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   Aching pain 04/07/2014   Diverticulitis large intestine 03/03/2014   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   S/P autologous bone marrow transplantation (HCC) 03/03/2014   Hypokalemia 01/19/2014   Smoking greater than 40 pack years 12/15/2013   Smoking trying to quit 12/15/2013   Hives of unknown origin 12/08/2013   Abnormal LFTs 12/01/2013   H/O peripheral stem cell transplant (HCC) 10/23/2013   Myeloma (HCC) 04/02/2013   Basal cell carcinoma 05/10/2011   Other seborrheic keratosis 04/20/2011   Encounter for screening colonoscopy 08/30/2010   Encounter for screening for malignant neoplasm of colon 08/30/2010    ONSET DATE: 09/23/2023   REFERRING DIAG: I63.9 (ICD-10-CM) - Stroke, acute, thrombotic (HCC)  THERAPY DIAG:  Aphasia  Cognitive communication deficit  Rationale for Evaluation and Treatment: Rehabilitation  SUBJECTIVE:   SUBJECTIVE STATEMENT: We have to change our health insurance.  Pt accompanied by: self and significant other  PERTINENT HISTORY: Patient is a 68 y.o. male with PMH: HTN, HLD, multiple myeloma on opioids who presented to the hospital on 09/23/23 with mild aphasia. His wife drove him to the ED. MRI of brain showed Multifocal acute ischemia within the left temporal and parietal lobes. Pt was hospitalized 8/17-8/19/25 and discharged home with his wife. He was referred for outpatient SLP therapy by Dr. Ary Cummins. Addendum: Pt was read[facility] to [facility] with another stroke on 10/04/2023 with exacerbation of aphasia.  PAIN:  Are you having pain? No  FALLS: Has patient fallen in last 6 months?  No  LIVING ENVIRONMENT: Lives with: lives with their spouse Lives in:  House/apartment  PLOF:  Level of assistance: Independent with ADLs, Independent with IADLs Employment: Retired  PATIENT GOALS: Improve speech  OBJECTIVE:  Note: Objective measures were completed at Evaluation unless otherwise noted.  DIAGNOSTIC FINDINGS:   MRI BRAIN WITH AND WITHOUT CONTRASTIMPRESSION: 1. Multifocal acute ischemia within the left temporal and parietal lobes. 2. Multifocal hyperintense T2-weighted signal within the cerebral white matter, most commonly due to chronic small vessel disease.   Electronically signed by: Franky Stanford MD 09/23/2023                                                                                                                    Previous Treatment:  Pt states that he went to his neurology appointment last week and  doesn't have to follow up with them again. He reports no changes, not much talking during the day because he is often by himself. He enjoys watching You Tube videos about westerns and he was encouraged to watch a video and then tell his wife about what he watched when she returns from work. In session, he initiated a conversation regarding [employer] and his dismay regarding perceived injustice within the league. He had difficulty finding his words and SLP facilitated the conversation by writing down key words on the dry erase board and encouraging expansion of utterances. He wrote a 10-item grocery list to dictation with 95% acc (minor spelling) and then wrote a self generated list of 5 items for a person grocery list with 100% acc. He was asked to create sentences with a given word and was able to do so with 100% acc with allowance for extra time and we will use these next session to create synonyms for the targeted words. Pt continues to make good progress toward goals.  TREATMENT DATE: 11/12/23 Pt completed HEP as assigned with 100% acc with occasional spelling errors, but legible. He went to a meeting regarding his changing health insurance  plan and reported feeling overwhelmed with keeping up with the information. SLP printed out [employer] options for Pt to review prior to his appointment this afternoon. We also created a list of association words he might use during his appointment. He voiced that he needs to address his dental concerns before the end of the year (dental extractions and dentures). He phoned a dentist recently to gather information, but then had to end the call because he had trouble following along. He did alert the person that he had a stroke and needed to call back when his wife could assist. In session, he was asked to complete a moderate level divergent naming task by naming items according to letter given in a specific category. He required mod assist to complete with 100% acc. SLP cued him to try thinking of word associations first and brainstorm and then get more specific. He was also encouraged to have his wife or the speaker (for the meeting later today) write down key words during their discussion so that he can more easily follow along. Receptive language is a strength and he still struggles to organize and verbalize his thoughts. Continue targeting goals.   PATIENT EDUCATION: Education details: create sentences with given words  Person educated: Patient and Spouse Education method: Explanation, Demonstration, and Handouts Education comprehension: verbalized understanding   GOALS: Goals reviewed with patient? Yes  SHORT TERM GOALS: Target date: 12/06/2023  Pt will implement word-finding strategies with 90% accuracy when unable to verbalize desired word in conversation/functional tasks with min assist. Baseline: 70% Goal status: Partially Met at 80%; continue goal  2.  Pt will describe objects and pictures by providing at least three salient features as judged by clinician with 90% acc when provided min cues.  Baseline: 75% Goal status: Partially Met; continue  3.  Pt will complete  moderately complex abstract divergent naming tasks with 90% acc with min cues.  Baseline: mod cues Goal status: Partially Met; continue  4.  Pt will complete high level verbal expression tasks (object description, state function, provide short summary, etc) using short sentences with 90% acc and min assist. Baseline: 70% Goal status: Partially Met; continue  5.  Pt will respond to open-ended questions using a complete sentence with 80% acc and min cues. Baseline: 75% Goal status: Met  6.  Pt will follow multistep auditory directions with 90% acc when provided min cues. Baseline: mod cues and repetition required Goal status: Met  7.  Pt will write short messages and lists with 90% accuracy for overall intelligibility with allowance for minor spelling mistakes. Baseline: 75% Goal status: ONGOING  LONG TERM GOALS: Same as short term goals   ASSESSMENT:  CLINICAL IMPRESSION: (initial evaluation 09/27/23) Patient is a 68 y.o. male who was seen today for a cognitive linguistic evaluation. Pt presents with mild/mod expressive language deficits and mild receptive language deficits which negatively impacts executive functioning. Pt primarily presents with dysnomia and reduced length of utterances for answering open ended questions. He had difficulty listing hobbies, restaurants he frequents, and other important names. He was able to follow 2-step directions, but breakdowns occurred with lengthier/complex auditory directions. 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 He is motivated to improve his language skills and has excellent family support.   OBJECTIVE IMPAIRMENTS: include expressive language, receptive language, and aphasia. These impairments are limiting patient from effectively communicating at home and in community. Factors affecting potential to achieve goals and functional outcome  are previous level of function and family/community support. Patient will benefit from skilled SLP services to address above impairments and improve overall function.  REHAB POTENTIAL: Excellent  PLAN:  SLP FREQUENCY: 2x/week  SLP DURATION: 4 weeks  PLANNED INTERVENTIONS: Cueing hierachy, Internal/external aids, Functional tasks, Multimodal communication approach, SLP instruction and feedback, Compensatory strategies, Patient/family education, [phone] Treatment of speech (30 or 45 min) , and [id number]- Speech [address], Artic, Phon, Eval Compre, Express   Thank you,  Lamar Candy, CCC-SLP [phone]  Rontavious Albright, CCC-SLP 11/12/2023, 11:27 AM

## 2023-11-14 ENCOUNTER — Encounter (HOSPITAL_COMMUNITY): Admitting: Speech Pathology

## 2023-11-14 DIAGNOSIS — Z87891 Personal history of nicotine dependence: Secondary | ICD-10-CM | POA: Diagnosis not present

## 2023-11-14 DIAGNOSIS — I639 Cerebral infarction, unspecified: Secondary | ICD-10-CM | POA: Diagnosis not present

## 2023-11-14 DIAGNOSIS — N401 Enlarged prostate with lower urinary tract symptoms: Secondary | ICD-10-CM | POA: Diagnosis not present

## 2023-11-14 DIAGNOSIS — E669 Obesity, unspecified: Secondary | ICD-10-CM | POA: Diagnosis not present

## 2023-11-14 DIAGNOSIS — J329 Chronic sinusitis, unspecified: Secondary | ICD-10-CM | POA: Diagnosis not present

## 2023-11-14 DIAGNOSIS — J341 Cyst and mucocele of nose and nasal sinus: Secondary | ICD-10-CM | POA: Diagnosis not present

## 2023-11-14 DIAGNOSIS — Z86718 Personal history of other venous thrombosis and embolism: Secondary | ICD-10-CM | POA: Diagnosis not present

## 2023-11-14 DIAGNOSIS — E041 Nontoxic single thyroid nodule: Secondary | ICD-10-CM | POA: Diagnosis not present

## 2023-11-14 DIAGNOSIS — R600 Localized edema: Secondary | ICD-10-CM | POA: Diagnosis not present

## 2023-11-14 DIAGNOSIS — E7800 Pure hypercholesterolemia, unspecified: Secondary | ICD-10-CM | POA: Diagnosis not present

## 2023-11-14 DIAGNOSIS — I10 Essential (primary) hypertension: Secondary | ICD-10-CM | POA: Diagnosis not present

## 2023-11-14 DIAGNOSIS — C9000 Multiple myeloma not having achieved remission: Secondary | ICD-10-CM | POA: Diagnosis not present

## 2023-11-15 ENCOUNTER — Ambulatory Visit (HOSPITAL_COMMUNITY): Admitting: Speech Pathology

## 2023-11-15 ENCOUNTER — Encounter (HOSPITAL_COMMUNITY): Payer: Self-pay | Admitting: Speech Pathology

## 2023-11-15 DIAGNOSIS — R4701 Aphasia: Secondary | ICD-10-CM

## 2023-11-15 DIAGNOSIS — R41841 Cognitive communication deficit: Secondary | ICD-10-CM

## 2023-11-15 NOTE — Therapy (Signed)
 OUTPATIENT SPEECH LANGUAGE PATHOLOGY TREATMENT   Patient Name: Craig Rivera MRN: [id number] DOB:July 30, 1955, 68 y.o., male Today's Date: 11/15/2023  PCP: Shona Norleen PEDLAR, MD REFERRING PROVIDER: Jerri Pfeiffer, MD  END OF SESSION:  End of Session - 11/15/23 1032     Visit Number 12    Number of Visits 18    Date for Recertification  12/06/23    Authorization Type [employer]   eff:02/07/23 oop: copay 5 auth no sw jacquline ref # [id number]   SLP Start Time 0945    SLP Stop Time  1030    SLP Time Calculation (min) 45 min    Activity Tolerance Patient tolerated treatment well          Past Medical History:  Diagnosis Date   Back pain    DVT (deep venous thrombosis) (HCC)    GERD (gastroesophageal reflux disease)    HTN (hypertension)    Hypercholesterolemia    Multiple myeloma (HCC)    Sciatic nerve pain    Past Surgical History:  Procedure Laterality Date   BONE MARROW TRANSPLANT     CATARACT EXTRACTION W/PHACO Right 08/11/2014   Procedure: CATARACT EXTRACTION PHACO AND INTRAOCULAR LENS PLACEMENT (IOC);  Surgeon: Dow JULIANNA Burke, MD;  Location: [facility];  Service: Ophthalmology;  Laterality: Right;  CDE 4.88   CATARACT EXTRACTION W/PHACO Left 10/26/2014   Procedure: CATARACT EXTRACTION PHACO AND INTRAOCULAR LENS PLACEMENT LEFT EYE CDE=8.64;  Surgeon: Dow JULIANNA Burke, MD;  Location: [facility];  Service: Ophthalmology;  Laterality: Left;   COLONOSCOPY  09/12/2010   Procedure: COLONOSCOPY;  Surgeon: Lamar CHRISTELLA Hollingshead, MD;  Location: [facility];  Service: Endoscopy;  Laterality: N/A;   COLONOSCOPY WITH PROPOFOL  N/A 01/06/2022   Procedure: COLONOSCOPY WITH PROPOFOL ;  Surgeon: Cindie Carlin POUR, DO;  Location: [facility];  Service: Endoscopy;  Laterality: N/A;  10:00am   inguinal hernia repair bilateral     as child    KNEE SURGERY Right    arthroscopy   POLYPECTOMY  01/06/2022   Procedure: POLYPECTOMY;  Surgeon: Cindie Carlin POUR, DO;  Location: [facility];  Service:  Endoscopy;;   TONSILLECTOMY     Patient Active Problem List   Diagnosis Date Noted   Ischemic stroke (HCC) 10/16/2023   Acute CVA (cerebrovascular accident) (HCC) 10/04/2023   Acute ischemic left middle cerebral artery (MCA) stroke (HCC) 10/04/2023   Weakness 10/04/2023   Acute ischemic left MCA stroke (HCC) 09/23/2023   Dizziness 05/15/2023   Other specified abnormalities of plasma proteins 11/12/2022   Impaired fasting glucose 11/12/2022   Thrombocytopenic disorder 11/12/2022   Cyst of nasal sinus 05/12/2022   Lower urinary tract symptoms due to benign prostatic hyperplasia 05/12/2022   Obesity 05/12/2022   Thyroid  nodule 05/12/2022   Dysuria 02/01/2022   Chronic pain 01/31/2022   Edema of lower extremity 01/31/2022   Gastroesophageal reflux disease without esophagitis 01/31/2022   Mixed hyperlipidemia 01/31/2022   Nausea 01/31/2022   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) 01/31/2022   Sciatica 01/31/2022   Positive colorectal cancer screening using Cologuard test 12/09/2021   Acute sinusitis 10/04/2021   Chronic sinusitis 10/04/2021   Diarrhea 10/04/2021   Encounter for antineoplastic chemotherapy 04/29/2021   Encounter for screening for other viral diseases 04/29/2021   Productive cough 12/13/2020   Upper respiratory infection 12/13/2020   Counseling and coordination of care 05/12/2020   Chronic anticoagulation 08/19/2019   Compression fracture of thoracic spine, non-traumatic, sequela 07/28/2019   Pulmonary  embolism (HCC) 09/11/2018   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   DVT of deep femoral vein, right (HCC) 11/29/2017   Cancer-related pain 07/25/2017   Essential hypertension 05/15/2017   Cough in adult 07/27/2015   Multiple myeloma (HCC) 07/22/2015   Easy fatigability 06/01/2015   Foreign body of right eye 06/01/2015   Rales 1/4 way up posterior chest wall on right side 04/06/2015   Chronic low back pain 02/09/2015    Sleeping difficulty 02/09/2015   Acute left-sided low back pain without sciatica 08/12/2014   Closed compression fracture of thoracic vertebra (HCC) 07/13/2014   Lipoma of abdominal wall 07/13/2014   Slow transit constipation 06/29/2014   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   Aching pain 04/07/2014   Diverticulitis large intestine 03/03/2014   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   S/P autologous bone marrow transplantation (HCC) 03/03/2014   Hypokalemia 01/19/2014   Smoking greater than 40 pack years 12/15/2013   Smoking trying to quit 12/15/2013   Hives of unknown origin 12/08/2013   Abnormal LFTs 12/01/2013   H/O peripheral stem cell transplant (HCC) 10/23/2013   Myeloma (HCC) 04/02/2013   Basal cell carcinoma 05/10/2011   Other seborrheic keratosis 04/20/2011   Encounter for screening colonoscopy 08/30/2010   Encounter for screening for malignant neoplasm of colon 08/30/2010    ONSET DATE: 09/23/2023   REFERRING DIAG: I63.9 (ICD-10-CM) - Stroke, acute, thrombotic (HCC)  THERAPY DIAG:  Aphasia  Cognitive communication deficit  Rationale for Evaluation and Treatment: Rehabilitation  SUBJECTIVE:   SUBJECTIVE STATEMENT: I had it and then I lost it.  Pt accompanied by: self and significant other  PERTINENT HISTORY: Patient is a 68 y.o. male with PMH: HTN, HLD, multiple myeloma on opioids who presented to the hospital on 09/23/23 with mild aphasia. His wife drove him to the ED. MRI of brain showed Multifocal acute ischemia within the left temporal and parietal lobes. Pt was hospitalized 8/17-8/19/25 and discharged home with his wife. He was referred for outpatient SLP therapy by Dr. Ary Cummins. Addendum: Pt was readmitted to [facility] with another stroke on 10/04/2023 with exacerbation of aphasia.  PAIN:  Are you having pain? No  FALLS: Has patient fallen in last 6 months?  No  LIVING ENVIRONMENT: Lives with: lives with their spouse Lives in: House/apartment  PLOF:   Level of assistance: Independent with ADLs, Independent with IADLs Employment: Retired  PATIENT GOALS: Improve speech  OBJECTIVE:  Note: Objective measures were completed at Evaluation unless otherwise noted.  DIAGNOSTIC FINDINGS:   MRI BRAIN WITH AND WITHOUT CONTRASTIMPRESSION: 1. Multifocal acute ischemia within the left temporal and parietal lobes. 2. Multifocal hyperintense T2-weighted signal within the cerebral white matter, most commonly due to chronic small vessel disease.   Electronically signed by: Franky Stanford MD 09/23/2023                                                                                                                    Previous Treatment:  Pt completed HEP as assigned with 100% acc with occasional spelling  errors, but legible. He went to a meeting regarding his changing health insurance plan and reported feeling overwhelmed with keeping up with the information. SLP printed out [employer] options for Pt to review prior to his appointment this afternoon. We also created a list of association words he might use during his appointment. He voiced that he needs to address his dental concerns before the end of the year (dental extractions and dentures). He phoned a dentist recently to gather information, but then had to end the call because he had trouble following along. He did alert the person that he had a stroke and needed to call back when his wife could assist. In session, he was asked to complete a moderate level divergent naming task by naming items according to letter given in a specific category. He required mod assist to complete with 100% acc. SLP cued him to try thinking of word associations first and brainstorm and then get more specific. He was also encouraged to have his wife or the speaker (for the meeting later today) write down key words during their discussion so that he can more easily follow along. Receptive language is a strength and he still  struggles to organize and verbalize his thoughts. Continue targeting goals.   TREATMENT DATE: 11/15/23 Pt stated that he had difficulty completing HEP (writing given words in a sentence) and we completed in session today. He was given the following words: happy, sad, intelligent, noisy, and relaxing and created a sentence after SLP provided a template (Who, did what, etc) and he completed with mi/mod assist. He then was asked to think of synonyms for each word which he did with 70% acc. SLP then altered the task by starting a sentence for him with an association word and he completed with 100% acc and min assist. He completed a naming task (general to more specific categories) with 88% acc and mi/mod assist. He reported losing his train of thought frequently and benefited from prompts to keep him on task. He was reminded to have his wife write down key words when they are having important conversations. Continue to target goals.   PATIENT EDUCATION: Education details: create sentences with given words  Person educated: Patient and Spouse Education method: Explanation, Demonstration, and Handouts Education comprehension: verbalized understanding   GOALS: Goals reviewed with patient? Yes  SHORT TERM GOALS: Target date: 12/06/2023  Pt will implement word-finding strategies with 90% accuracy when unable to verbalize desired word in conversation/functional tasks with min assist. Baseline: 70% Goal status: Partially Met at 80%; continue goal  2.  Pt will describe objects and pictures by providing at least three salient features as judged by clinician with 90% acc when provided min cues.  Baseline: 75% Goal status: Partially Met; continue  3.  Pt will complete moderately complex abstract divergent naming tasks with 90% acc with min cues.  Baseline: mod cues Goal status: Partially Met; continue  4.  Pt will complete high level verbal expression tasks (object description, state function, provide  short summary, etc) using short sentences with 90% acc and min assist. Baseline: 70% Goal status: Partially Met; continue  5.  Pt will respond to open-ended questions using a complete sentence with 80% acc and min cues. Baseline: 75% Goal status: Met  6.  Pt will follow multistep auditory directions with 90% acc when provided min cues. Baseline: mod cues and repetition required Goal status: Met  7.  Pt will write short messages and lists with 90% accuracy for  overall intelligibility with allowance for minor spelling mistakes. Baseline: 75% Goal status: ONGOING  LONG TERM GOALS: Same as short term goals   ASSESSMENT:  CLINICAL IMPRESSION: (initial evaluation 09/27/23) Patient is a 68 y.o. male who was seen today for a cognitive linguistic evaluation. Pt presents with mild/mod expressive language deficits and mild receptive language deficits which negatively impacts executive functioning. Pt primarily presents with dysnomia and reduced length of utterances for answering open ended questions. He had difficulty listing hobbies, restaurants he frequents, and other important names. He was able to follow 2-step directions, but breakdowns occurred with lengthier/complex auditory directions. 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 He is motivated to improve his language skills and has excellent family support.   OBJECTIVE IMPAIRMENTS: include expressive language, receptive language, and aphasia. These impairments are limiting patient from effectively communicating at home and in community. Factors affecting potential to achieve goals and functional outcome are previous level of function and family/community support. Patient will benefit from skilled SLP services to address above impairments and improve overall function.  REHAB POTENTIAL: Excellent  PLAN:  SLP FREQUENCY: 2x/week  SLP  DURATION: 4 weeks  PLANNED INTERVENTIONS: Cueing hierachy, Internal/external aids, Functional tasks, Multimodal communication approach, SLP instruction and feedback, Compensatory strategies, Patient/family education, [phone] Treatment of speech (30 or 45 min) , and [id number]- Speech [address], Artic, Phon, Eval Compre, Express   Thank you,  Lamar Candy, CCC-SLP [phone]  Tysha Grismore, CCC-SLP 11/15/2023, 10:34 AM

## 2023-11-21 ENCOUNTER — Ambulatory Visit (HOSPITAL_COMMUNITY): Admitting: Speech Pathology

## 2023-11-21 ENCOUNTER — Encounter (HOSPITAL_COMMUNITY): Payer: Self-pay | Admitting: Speech Pathology

## 2023-11-21 DIAGNOSIS — R4701 Aphasia: Secondary | ICD-10-CM

## 2023-11-21 DIAGNOSIS — R41841 Cognitive communication deficit: Secondary | ICD-10-CM

## 2023-11-21 NOTE — Therapy (Signed)
 OUTPATIENT SPEECH LANGUAGE PATHOLOGY TREATMENT   Patient Name: Craig Rivera MRN: [id number] DOB:01-17-1956, 68 y.o., male Today's Date: 11/21/2023  PCP: Shona Norleen PEDLAR, MD REFERRING PROVIDER: Jerri Pfeiffer, MD  END OF SESSION:  End of Session - 11/21/23 0950     Visit Number 13    Number of Visits 18    Date for Recertification  12/06/23    Authorization Type [employer]   eff:02/07/23 oop: copay 5 auth no sw jacquline ref # [id number]   SLP Start Time 0945    SLP Stop Time  1030    SLP Time Calculation (min) 45 min    Activity Tolerance Patient tolerated treatment well          Past Medical History:  Diagnosis Date   Back pain    DVT (deep venous thrombosis) (HCC)    GERD (gastroesophageal reflux disease)    HTN (hypertension)    Hypercholesterolemia    Multiple myeloma (HCC)    Sciatic nerve pain    Past Surgical History:  Procedure Laterality Date   BONE MARROW TRANSPLANT     CATARACT EXTRACTION W/PHACO Right 08/11/2014   Procedure: CATARACT EXTRACTION PHACO AND INTRAOCULAR LENS PLACEMENT (IOC);  Surgeon: Dow JULIANNA Burke, MD;  Location: [facility];  Service: Ophthalmology;  Laterality: Right;  CDE 4.88   CATARACT EXTRACTION W/PHACO Left 10/26/2014   Procedure: CATARACT EXTRACTION PHACO AND INTRAOCULAR LENS PLACEMENT LEFT EYE CDE=8.64;  Surgeon: Dow JULIANNA Burke, MD;  Location: [facility];  Service: Ophthalmology;  Laterality: Left;   COLONOSCOPY  09/12/2010   Procedure: COLONOSCOPY;  Surgeon: Lamar CHRISTELLA Hollingshead, MD;  [facility];  Service: Endoscopy;  Laterality: N/A;   COLONOSCOPY WITH PROPOFOL  N/A 01/06/2022   Procedure: COLONOSCOPY WITH PROPOFOL ;  Surgeon: Cindie Carlin POUR, DO;  [facility]: [facility];  Service: Endoscopy;  Laterality: N/A;  10:00am   inguinal hernia repair bilateral     as child    KNEE SURGERY Right    arthroscopy   POLYPECTOMY  01/06/2022   Procedure: POLYPECTOMY;  Surgeon: Cindie Carlin POUR, DO;  [facility]: [facility];  Service:  Endoscopy;;   TONSILLECTOMY     Patient Active Problem List   Diagnosis Date Noted   Ischemic stroke (HCC) 10/16/2023   Acute CVA (cerebrovascular accident) (HCC) 10/04/2023   Acute ischemic left middle cerebral artery (MCA) stroke (HCC) 10/04/2023   Weakness 10/04/2023   Acute ischemic left MCA stroke (HCC) 09/23/2023   Dizziness 05/15/2023   Other specified abnormalities of plasma proteins 11/12/2022   Impaired fasting glucose 11/12/2022   Thrombocytopenic disorder 11/12/2022   Cyst of nasal sinus 05/12/2022   Lower urinary tract symptoms due to benign prostatic hyperplasia 05/12/2022   Obesity 05/12/2022   Thyroid  nodule 05/12/2022   Dysuria 02/01/2022   Chronic pain 01/31/2022   Edema of lower extremity 01/31/2022   Gastroesophageal reflux disease without esophagitis 01/31/2022   Mixed hyperlipidemia 01/31/2022   Nausea 01/31/2022   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) 01/31/2022   Sciatica 01/31/2022   Positive colorectal cancer screening using Cologuard test 12/09/2021   Acute sinusitis 10/04/2021   Chronic sinusitis 10/04/2021   Diarrhea 10/04/2021   Encounter for antineoplastic chemotherapy 04/29/2021   Encounter for screening for other viral diseases 04/29/2021   Productive cough 12/13/2020   Upper respiratory infection 12/13/2020   Counseling and coordination of care 05/12/2020   Chronic anticoagulation 08/19/2019   Compression fracture of thoracic spine, non-traumatic, sequela 07/28/2019   Pulmonary  embolism (HCC) 09/11/2018   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   DVT of deep femoral vein, right (HCC) 11/29/2017   Cancer-related pain 07/25/2017   Essential hypertension 05/15/2017   Cough in adult 07/27/2015   Multiple myeloma (HCC) 07/22/2015   Easy fatigability 06/01/2015   Foreign body of right eye 06/01/2015   Rales 1/4 way up posterior chest wall on right side 04/06/2015   Chronic low back pain 02/09/2015    Sleeping difficulty 02/09/2015   Acute left-sided low back pain without sciatica 08/12/2014   Closed compression fracture of thoracic vertebra (HCC) 07/13/2014   Lipoma of abdominal wall 07/13/2014   Slow transit constipation 06/29/2014   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   Aching pain 04/07/2014   Diverticulitis large intestine 03/03/2014   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   S/P autologous bone marrow transplantation (HCC) 03/03/2014   Hypokalemia 01/19/2014   Smoking greater than 40 pack years 12/15/2013   Smoking trying to quit 12/15/2013   Hives of unknown origin 12/08/2013   Abnormal LFTs 12/01/2013   H/O peripheral stem cell transplant (HCC) 10/23/2013   Myeloma (HCC) 04/02/2013   Basal cell carcinoma 05/10/2011   Other seborrheic keratosis 04/20/2011   Encounter for screening colonoscopy 08/30/2010   Encounter for screening for malignant neoplasm of colon 08/30/2010    ONSET DATE: 09/23/2023   REFERRING DIAG: I63.9 (ICD-10-CM) - Stroke, acute, thrombotic (HCC)  THERAPY DIAG:  Aphasia  Cognitive communication deficit  Rationale for Evaluation and Treatment: Rehabilitation  SUBJECTIVE:   SUBJECTIVE STATEMENT: I am signed up in [place] to get my teeth fixed.  Pt accompanied by: self and significant other  PERTINENT HISTORY: Patient is a 68 y.o. male with PMH: HTN, HLD, multiple myeloma on opioids who presented to the hospital on 09/23/23 with mild aphasia. His wife drove him to the ED. MRI of brain showed Multifocal acute ischemia within the left temporal and parietal lobes. Pt was hospitalized 8/17-8/19/25 and discharged home with his wife. He was referred for outpatient SLP therapy by Dr. Ary Cummins. Addendum: Pt was readmitted to [facility] with another stroke on 10/04/2023 with exacerbation of aphasia.  PAIN:  Are you having pain? No  FALLS: Has patient fallen in last 6 months?  No  LIVING ENVIRONMENT: Lives with: lives with their spouse Lives in:  House/apartment  PLOF:  Level of assistance: Independent with ADLs, Independent with IADLs Employment: Retired  PATIENT GOALS: Improve speech  OBJECTIVE:  Note: Objective measures were completed at Evaluation unless otherwise noted.  DIAGNOSTIC FINDINGS:   MRI BRAIN WITH AND WITHOUT CONTRASTIMPRESSION: 1. Multifocal acute ischemia within the left temporal and parietal lobes. 2. Multifocal hyperintense T2-weighted signal within the cerebral white matter, most commonly due to chronic small vessel disease.   Electronically signed by: Franky Stanford MD 09/23/2023                                                                                                                    Previous Treatment:   Pt stated that he had difficulty completing  HEP (writing given words in a sentence) and we completed in session today. He was given the following words: happy, sad, intelligent, noisy, and relaxing and created a sentence after SLP provided a template (Who, did what, etc) and he completed with mi/mod assist. He then was asked to think of synonyms for each word which he did with 70% acc. SLP then altered the task by starting a sentence for him with an association word and he completed with 100% acc and min assist. He completed a naming task (general to more specific categories) with 88% acc and mi/mod assist. He reported losing his train of thought frequently and benefited from prompts to keep him on task. He was reminded to have his wife write down key words when they are having important conversations. Continue to target goals.  TREATMENT DATE: 11/21/23  Pt went to [facility] to arrange an appointment to have his teeth extracted. He  PATIENT EDUCATION: Education details: create sentences with given words  Person educated: Patient and Spouse Education method: Explanation, Demonstration, and Handouts Education comprehension: verbalized understanding   GOALS: Goals reviewed with patient? Yes  SHORT  TERM GOALS: Target date: 12/06/2023  Pt will implement word-finding strategies with 90% accuracy when unable to verbalize desired word in conversation/functional tasks with min assist. Baseline: 70% Goal status: Partially Met at 80%; continue goal  2.  Pt will describe objects and pictures by providing at least three salient features as judged by clinician with 90% acc when provided min cues.  Baseline: 75% Goal status: Partially Met; continue  3.  Pt will complete moderately complex abstract divergent naming tasks with 90% acc with min cues.  Baseline: mod cues Goal status: Partially Met; continue  4.  Pt will complete high level verbal expression tasks (object description, state function, provide short summary, etc) using short sentences with 90% acc and min assist. Baseline: 70% Goal status: Partially Met; continue  5.  Pt will respond to open-ended questions using a complete sentence with 80% acc and min cues. Baseline: 75% Goal status: Met  6.  Pt will follow multistep auditory directions with 90% acc when provided min cues. Baseline: mod cues and repetition required Goal status: Met  7.  Pt will write short messages and lists with 90% accuracy for overall intelligibility with allowance for minor spelling mistakes. Baseline: 75% Goal status: ONGOING  LONG TERM GOALS: Same as short term goals   ASSESSMENT:  CLINICAL IMPRESSION: (initial evaluation 09/27/23) Patient is a 68 y.o. male who was seen today for a cognitive linguistic evaluation. Pt presents with mild/mod expressive language deficits and mild receptive language deficits which negatively impacts executive functioning. Pt primarily presents with dysnomia and reduced length of utterances for answering open ended questions. He had difficulty listing hobbies, restaurants he frequents, and other important names. He was able to follow 2-step directions, but breakdowns occurred with lengthier/complex auditory directions.  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 He is motivated to improve his language skills and has excellent family support.   OBJECTIVE IMPAIRMENTS: include expressive language, receptive language, and aphasia. These impairments are limiting patient from effectively communicating at home and in community. Factors affecting potential to achieve goals and functional outcome are previous level of function and family/community support. Patient will benefit from skilled SLP services to address above impairments and improve overall function.  REHAB POTENTIAL: Excellent  PLAN:  SLP FREQUENCY: 2x/week  SLP DURATION: 4 weeks  PLANNED INTERVENTIONS: Cueing hierachy, Internal/external aids, Functional tasks, Multimodal communication approach, SLP instruction and feedback, Compensatory strategies, Patient/family education, [phone] Treatment of speech (30 or 45 min) , and [id number]- Speech [address], Artic, Phon, Eval Compre, Express   Thank you,  Lamar Candy, CCC-SLP [phone]  Gwenette Wellons, CCC-SLP 11/21/2023, 9:52 AM

## 2023-11-26 ENCOUNTER — Ambulatory Visit (HOSPITAL_COMMUNITY): Admitting: Speech Pathology

## 2023-11-26 ENCOUNTER — Encounter (HOSPITAL_COMMUNITY): Payer: Self-pay | Admitting: Speech Pathology

## 2023-11-26 DIAGNOSIS — R4701 Aphasia: Secondary | ICD-10-CM | POA: Diagnosis not present

## 2023-11-26 DIAGNOSIS — R41841 Cognitive communication deficit: Secondary | ICD-10-CM

## 2023-11-26 NOTE — Therapy (Signed)
 OUTPATIENT SPEECH LANGUAGE PATHOLOGY TREATMENT   Patient Name: Craig Rivera MRN: [id number] DOB:21-Feb-1955, 68 y.o., male Today's Date: 11/26/2023  PCP: Shona Norleen PEDLAR, MD REFERRING PROVIDER: Jerri Pfeiffer, MD  END OF SESSION:  End of Session - 11/26/23 1001     Visit Number 14    Number of Visits 18    Date for Recertification  12/06/23    Authorization Type [employer]   eff:02/07/23 oop: copay 5 auth no sw jacquline ref # [id number]   SLP Start Time 0945    SLP Stop Time  1030    SLP Time Calculation (min) 45 min    Activity Tolerance Patient tolerated treatment well          Past Medical History:  Diagnosis Date   Back pain    DVT (deep venous thrombosis) (HCC)    GERD (gastroesophageal reflux disease)    HTN (hypertension)    Hypercholesterolemia    Multiple myeloma (HCC)    Sciatic nerve pain    Past Surgical History:  Procedure Laterality Date   BONE MARROW TRANSPLANT     CATARACT EXTRACTION W/PHACO Right 08/11/2014   Procedure: CATARACT EXTRACTION PHACO AND INTRAOCULAR LENS PLACEMENT (IOC);  Surgeon: Dow JULIANNA Burke, MD;  Location: [facility];  Service: Ophthalmology;  Laterality: Right;  CDE 4.88   CATARACT EXTRACTION W/PHACO Left 10/26/2014   Procedure: CATARACT EXTRACTION PHACO AND INTRAOCULAR LENS PLACEMENT LEFT EYE CDE=8.64;  Surgeon: Dow JULIANNA Burke, MD;  Location: [facility];  Service: Ophthalmology;  Laterality: Left;   COLONOSCOPY  09/12/2010   Procedure: COLONOSCOPY;  Surgeon: Lamar CHRISTELLA Hollingshead, MD;  Location: [facility];  Service: Endoscopy;  Laterality: N/A;   COLONOSCOPY WITH PROPOFOL  N/A 01/06/2022   Procedure: COLONOSCOPY WITH PROPOFOL ;  Surgeon: Cindie Carlin POUR, DO;  Location: [facility];  Service: Endoscopy;  Laterality: N/A;  10:00am   inguinal hernia repair bilateral     as child    KNEE SURGERY Right    arthroscopy   POLYPECTOMY  01/06/2022   Procedure: POLYPECTOMY;  Surgeon: Cindie Carlin POUR, DO;  Location: [facility];  Service:  Endoscopy;;   TONSILLECTOMY     Patient Active Problem List   Diagnosis Date Noted   Ischemic stroke (HCC) 10/16/2023   Acute CVA (cerebrovascular accident) (HCC) 10/04/2023   Acute ischemic left middle cerebral artery (MCA) stroke (HCC) 10/04/2023   Weakness 10/04/2023   Acute ischemic left MCA stroke (HCC) 09/23/2023   Dizziness 05/15/2023   Other specified abnormalities of plasma proteins 11/12/2022   Impaired fasting glucose 11/12/2022   Thrombocytopenic disorder 11/12/2022   Cyst of nasal sinus 05/12/2022   Lower urinary tract symptoms due to benign prostatic hyperplasia 05/12/2022   Obesity 05/12/2022   Thyroid  nodule 05/12/2022   Dysuria 02/01/2022   Chronic pain 01/31/2022   Edema of lower extremity 01/31/2022   Gastroesophageal reflux disease without esophagitis 01/31/2022   Mixed hyperlipidemia 01/31/2022   Nausea 01/31/2022   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) 01/31/2022   Sciatica 01/31/2022   Positive colorectal cancer screening using Cologuard test 12/09/2021   Acute sinusitis 10/04/2021   Chronic sinusitis 10/04/2021   Diarrhea 10/04/2021   Encounter for antineoplastic chemotherapy 04/29/2021   Encounter for screening for other viral diseases 04/29/2021   Productive cough 12/13/2020   Upper respiratory infection 12/13/2020   Counseling and coordination of care 05/12/2020   Chronic anticoagulation 08/19/2019   Compression fracture of thoracic spine, non-traumatic, sequela 07/28/2019   Pulmonary  embolism (HCC) 09/11/2018   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   DVT of deep femoral vein, right (HCC) 11/29/2017   Cancer-related pain 07/25/2017   Essential hypertension 05/15/2017   Cough in adult 07/27/2015   Multiple myeloma (HCC) 07/22/2015   Easy fatigability 06/01/2015   Foreign body of right eye 06/01/2015   Rales 1/4 way up posterior chest wall on right side 04/06/2015   Chronic low back pain 02/09/2015    Sleeping difficulty 02/09/2015   Acute left-sided low back pain without sciatica 08/12/2014   Closed compression fracture of thoracic vertebra (HCC) 07/13/2014   Lipoma of abdominal wall 07/13/2014   Slow transit constipation 06/29/2014   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   Aching pain 04/07/2014   Diverticulitis large intestine 03/03/2014   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   S/P autologous bone marrow transplantation (HCC) 03/03/2014   Hypokalemia 01/19/2014   Smoking greater than 40 pack years 12/15/2013   Smoking trying to quit 12/15/2013   Hives of unknown origin 12/08/2013   Abnormal LFTs 12/01/2013   H/O peripheral stem cell transplant (HCC) 10/23/2013   Myeloma (HCC) 04/02/2013   Basal cell carcinoma 05/10/2011   Other seborrheic keratosis 04/20/2011   Encounter for screening colonoscopy 08/30/2010   Encounter for screening for malignant neoplasm of colon 08/30/2010    ONSET DATE: 09/23/2023   REFERRING DIAG: I63.9 (ICD-10-CM) - Stroke, acute, thrombotic (HCC)  THERAPY DIAG:  Aphasia  Cognitive communication deficit  Rationale for Evaluation and Treatment: Rehabilitation  SUBJECTIVE:   SUBJECTIVE STATEMENT: My brain is not working today.  Pt accompanied by: self and significant other  PERTINENT HISTORY: Patient is a 68 y.o. male with PMH: HTN, HLD, multiple myeloma on opioids who presented to the hospital on 09/23/23 with mild aphasia. His wife drove him to the ED. MRI of brain showed Multifocal acute ischemia within the left temporal and parietal lobes. Pt was hospitalized 8/17-8/19/25 and discharged home with his wife. He was referred for outpatient SLP therapy by Dr. Ary Cummins. Addendum: Pt was readmitted to [facility] with another stroke on 10/04/2023 with exacerbation of aphasia.  PAIN:  Are you having pain? No  FALLS: Has patient fallen in last 6 months?  No  LIVING ENVIRONMENT: Lives with: lives with their spouse Lives in: House/apartment  PLOF:   Level of assistance: Independent with ADLs, Independent with IADLs Employment: Retired  PATIENT GOALS: Improve speech  OBJECTIVE:  Note: Objective measures were completed at Evaluation unless otherwise noted.  DIAGNOSTIC FINDINGS:   MRI BRAIN WITH AND WITHOUT CONTRASTIMPRESSION: 1. Multifocal acute ischemia within the left temporal and parietal lobes. 2. Multifocal hyperintense T2-weighted signal within the cerebral white matter, most commonly due to chronic small vessel disease.   Electronically signed by: Franky Stanford MD 09/23/2023                                                                                                                    Previous Treatment:   Pt went to [facility] to arrange an appointment to have his teeth  extracted. He was encouraged to think of questions he may want to ask the dentist and SLP wrote them down. He was able to come up with 4 questions with min assist from SLP (related to cost, healing, denture fitting, and medication implications). He completed sentence generation task of completing a sentence after given initial few words (100% acc). He completed a moderately complex divergent naming task involving generating specific items according to specific features. He completed with 100% acc with mod assist. He continues to report losing his train of thought and has difficulty returning to the task or topic. He was encouraged to have listener write down key words as needed so that he can follow along. Continue plan of care.   TREATMENT DATE: 11/26/23  Pt did not complete HEP because he stated that it was difficult. We completed together in session with max cues for naming a general category when more specific item is listed. He completed divergent naming task with 100% acc with min cues (primarily to help direct train of thought). He described pictured items by giving most salient features with 100% acc with min assist to use list of word finding strategies. He  reported that unfortunately, he is not able to have his teeth extracted until he sees a different doctor because of his cancer. He was given a divergent naming task for homework which only requires him to place a list of written words in a specific category. Next session, target open ended questions with use of word finding strategies and have Pt provide verbal summaries of information.   PATIENT EDUCATION: Education details: create sentences with given words  Person educated: Patient and Spouse Education method: Explanation, Demonstration, and Handouts Education comprehension: verbalized understanding   GOALS: Goals reviewed with patient? Yes  SHORT TERM GOALS: Target date: 12/06/2023  Pt will implement word-finding strategies with 90% accuracy when unable to verbalize desired word in conversation/functional tasks with min assist. Baseline: 70% Goal status: Partially Met at 80%; continue goal  2.  Pt will describe objects and pictures by providing at least three salient features as judged by clinician with 90% acc when provided min cues.  Baseline: 75% Goal status: Partially Met; continue  3.  Pt will complete moderately complex abstract divergent naming tasks with 90% acc with min cues.  Baseline: mod cues Goal status: Partially Met; continue  4.  Pt will complete high level verbal expression tasks (object description, state function, provide short summary, etc) using short sentences with 90% acc and min assist. Baseline: 70% Goal status: Partially Met; continue  5.  Pt will respond to open-ended questions using a complete sentence with 80% acc and min cues. Baseline: 75% Goal status: Met  6.  Pt will follow multistep auditory directions with 90% acc when provided min cues. Baseline: mod cues and repetition required Goal status: Met  7.  Pt will write short messages and lists with 90% accuracy for overall intelligibility with allowance for minor spelling mistakes. Baseline:  75% Goal status: ONGOING  LONG TERM GOALS: Same as short term goals   ASSESSMENT:  CLINICAL IMPRESSION: (initial evaluation 09/27/23) Patient is a 68 y.o. male who was seen today for a cognitive linguistic evaluation. Pt presents with mild/mod expressive language deficits and mild receptive language deficits which negatively impacts executive functioning. Pt primarily presents with dysnomia and reduced length of utterances for answering open ended questions. He had difficulty listing hobbies, restaurants he frequents, and other important names. He was able to follow 2-step directions, but  breakdowns occurred with lengthier/complex auditory directions. 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 He is motivated to improve his language skills and has excellent family support.   OBJECTIVE IMPAIRMENTS: include expressive language, receptive language, and aphasia. These impairments are limiting patient from effectively communicating at home and in community. Factors affecting potential to achieve goals and functional outcome are previous level of function and family/community support. Patient will benefit from skilled SLP services to address above impairments and improve overall function.  REHAB POTENTIAL: Excellent  PLAN:  SLP FREQUENCY: 2x/week  SLP DURATION: 4 weeks  PLANNED INTERVENTIONS: Cueing hierachy, Internal/external aids, Functional tasks, Multimodal communication approach, SLP instruction and feedback, Compensatory strategies, Patient/family education, [phone] Treatment of speech (30 or 45 min) , and [id number]- Speech [address] Artic, Phon, Eval Compre, Express   Thank you,  Lamar Candy, CCC-SLP [phone]  Dheeraj Hail, CCC-SLP 11/26/2023, 10:04 AM

## 2023-11-26 NOTE — Progress Notes (Signed)
 I had the pleasure of seeing [name] in the office today.  He was accompanied by his wife.   Briefly he is 46 and right-handed.   On 09/23/2023 he suffered a left hemispheric stroke, which predominantly was speech abnormality with some right sided weakness.  The symptoms improved.  He had slight worsening of the symptoms on 10/04/2023 and had repeat imaging of his CT and CT angiogram on that day, and a repeat MRI the next day.   I reviewed all of the above imaging.  He does not have extracranial carotid artery stenosis.  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   The brain MRIs show small embolic infarcts in the MCA territory, mostly in the temporal/parietal region.   I reviewed his echocardiogram from 09/24/2023 which showed a normal ejection fraction and no cardiac embolic cause.   He was diagnosed with multiple myeloma about 10 years ago.  Around 5 years ago he had a DVT, without pulmonary embolism, and has been on anticoagulation since then.   His current anticoagulation is apixaban  and aspirin .  He is also on 20 mg atorvastatin  and takes medication for his blood pressure.   Blood pressure in the office today is 134/86.   He is overweight but otherwise appears healthy.   Lungs are clear.   Heart sounds are normal.   There is a mild expressive aphasia.  No receptive component.  No dysarthria.   His visual fields are full in both eyes.   His strength, sensation and coordination are normal in the arms and legs.  There is no inattention.   Assessment:   1.  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  No surgery needed.  I explained to the patient and his wife that there is no advantage to stenting but over medical treatment alone in the situation.  Where he developed recurrent events  on medical therapy we can reconsider. 2.  No routine follow-up imaging needed unless he develops new stroke symptoms 3.  From the standpoint of the intracranial atherosclerotic disease, aspirin  and apixaban  are an acceptable combination.  If he no longer needs the apixaban , I would suggest switching to clopidogrel  for 6 months. 4.  He was asking about driving.  He drives very little and only locally.  I do not see anything on his neurologic exam which would contraindicate driving.   I have not scheduled any routine follow-up with me.  I am glad to see him again at any time as needed.  If            Electronically signed by Lester Golas, MD at 10/24/2023  3:00 PM

## 2023-11-27 DIAGNOSIS — Z5112 Encounter for antineoplastic immunotherapy: Secondary | ICD-10-CM | POA: Diagnosis not present

## 2023-11-27 DIAGNOSIS — Z5111 Encounter for antineoplastic chemotherapy: Secondary | ICD-10-CM | POA: Diagnosis not present

## 2023-11-27 DIAGNOSIS — C9000 Multiple myeloma not having achieved remission: Secondary | ICD-10-CM | POA: Diagnosis not present

## 2023-11-27 DIAGNOSIS — Z9484 Stem cells transplant status: Secondary | ICD-10-CM | POA: Diagnosis not present

## 2023-11-27 DIAGNOSIS — Z1159 Encounter for screening for other viral diseases: Secondary | ICD-10-CM | POA: Diagnosis not present

## 2023-11-28 ENCOUNTER — Telehealth (HOSPITAL_COMMUNITY): Payer: Self-pay | Admitting: Speech Pathology

## 2023-11-28 ENCOUNTER — Encounter (HOSPITAL_COMMUNITY): Admitting: Speech Pathology

## 2023-11-28 NOTE — Telephone Encounter (Signed)
 Telephone Call:  Pt did not show for his 9:45 AM appointment. SLP left him a voice mail to make sure he is ok and to see if he would like to come today at 11:30 AM.  Thank you,  Lamar Candy, CCC-SLP [phone]

## 2023-11-29 DIAGNOSIS — Z5112 Encounter for antineoplastic immunotherapy: Secondary | ICD-10-CM | POA: Diagnosis not present

## 2023-11-29 DIAGNOSIS — Z9484 Stem cells transplant status: Secondary | ICD-10-CM | POA: Diagnosis not present

## 2023-11-29 DIAGNOSIS — Z1159 Encounter for screening for other viral diseases: Secondary | ICD-10-CM | POA: Diagnosis not present

## 2023-11-29 DIAGNOSIS — C9000 Multiple myeloma not having achieved remission: Secondary | ICD-10-CM | POA: Diagnosis not present

## 2023-11-29 NOTE — Progress Notes (Signed)
 Patient arrived to clinic ambulatory.  Vital signs obtained.  Port accessed per protocol with positive blood return. Port flushed and heparin  locked.   Premedication given as ordered.  Chemotherapy given as ordered.  Patient tolerated well without complaint.   Patient d/c to home.  Patient aware of return appointment.

## 2023-12-03 ENCOUNTER — Encounter (HOSPITAL_COMMUNITY): Payer: Self-pay | Admitting: Speech Pathology

## 2023-12-03 ENCOUNTER — Ambulatory Visit (HOSPITAL_COMMUNITY): Admitting: Speech Pathology

## 2023-12-03 DIAGNOSIS — R4701 Aphasia: Secondary | ICD-10-CM

## 2023-12-03 DIAGNOSIS — R41841 Cognitive communication deficit: Secondary | ICD-10-CM

## 2023-12-03 NOTE — Therapy (Signed)
 OUTPATIENT SPEECH LANGUAGE PATHOLOGY TREATMENT   Patient Name: Craig Rivera MRN: [id number] DOB:07-20-1955, 68 y.o., male Today's Date: 12/03/2023  PCP: Shona Norleen PEDLAR, MD REFERRING PROVIDER: Jerri Pfeiffer, MD  END OF SESSION:  End of Session - 12/03/23 1133     Visit Number 15    Number of Visits 18    Date for Recertification  12/06/23    Authorization Type [employer]   eff:02/07/23 oop: copay 5 auth no sw jacquline ref # [id number]   SLP Start Time 1120    SLP Stop Time  1205    SLP Time Calculation (min) 45 min    Activity Tolerance Patient tolerated treatment well          Past Medical History:  Diagnosis Date   Back pain    DVT (deep venous thrombosis) (HCC)    GERD (gastroesophageal reflux disease)    HTN (hypertension)    Hypercholesterolemia    Multiple myeloma (HCC)    Sciatic nerve pain    Past Surgical History:  Procedure Laterality Date   BONE MARROW TRANSPLANT     CATARACT EXTRACTION W/PHACO Right 08/11/2014   Procedure: CATARACT EXTRACTION PHACO AND INTRAOCULAR LENS PLACEMENT (IOC);  Surgeon: Dow JULIANNA Burke, MD;  Location: [facility];  Service: Ophthalmology;  Laterality: Right;  CDE 4.88   CATARACT EXTRACTION W/PHACO Left 10/26/2014   Procedure: CATARACT EXTRACTION PHACO AND INTRAOCULAR LENS PLACEMENT LEFT EYE CDE=8.64;  Surgeon: Dow JULIANNA Burke, MD;  Location: [facility];  Service: Ophthalmology;  Laterality: Left;   COLONOSCOPY  09/12/2010   Procedure: COLONOSCOPY;  Surgeon: Lamar CHRISTELLA Hollingshead, MD;  Location: [facility];  Service: Endoscopy;  Laterality: N/A;   COLONOSCOPY WITH PROPOFOL  N/A 01/06/2022   Procedure: COLONOSCOPY WITH PROPOFOL ;  Surgeon: Cindie Carlin POUR, DO;  Location: [facility];  Service: Endoscopy;  Laterality: N/A;  10:00am   inguinal hernia repair bilateral     as child    KNEE SURGERY Right    arthroscopy   POLYPECTOMY  01/06/2022   Procedure: POLYPECTOMY;  Surgeon: Cindie Carlin POUR, DO;  Location: [facility];  Service:  Endoscopy;;   TONSILLECTOMY     Patient Active Problem List   Diagnosis Date Noted   Ischemic stroke (HCC) 10/16/2023   Acute CVA (cerebrovascular accident) (HCC) 10/04/2023   Acute ischemic left middle cerebral artery (MCA) stroke (HCC) 10/04/2023   Weakness 10/04/2023   Acute ischemic left MCA stroke (HCC) 09/23/2023   Dizziness 05/15/2023   Other specified abnormalities of plasma proteins 11/12/2022   Impaired fasting glucose 11/12/2022   Thrombocytopenic disorder 11/12/2022   Cyst of nasal sinus 05/12/2022   Lower urinary tract symptoms due to benign prostatic hyperplasia 05/12/2022   Obesity 05/12/2022   Thyroid  nodule 05/12/2022   Dysuria 02/01/2022   Chronic pain 01/31/2022   Edema of lower extremity 01/31/2022   Gastroesophageal reflux disease without esophagitis 01/31/2022   Mixed hyperlipidemia 01/31/2022   Nausea 01/31/2022   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) 01/31/2022   Sciatica 01/31/2022   Positive colorectal cancer screening using Cologuard test 12/09/2021   Acute sinusitis 10/04/2021   Chronic sinusitis 10/04/2021   Diarrhea 10/04/2021   Encounter for antineoplastic chemotherapy 04/29/2021   Encounter for screening for other viral diseases 04/29/2021   Productive cough 12/13/2020   Upper respiratory infection 12/13/2020   Counseling and coordination of care 05/12/2020   Chronic anticoagulation 08/19/2019   Compression fracture of thoracic spine, non-traumatic, sequela 07/28/2019   Pulmonary  embolism (HCC) 09/11/2018   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   DVT of deep femoral vein, right (HCC) 11/29/2017   Cancer-related pain 07/25/2017   Essential hypertension 05/15/2017   Cough in adult 07/27/2015   Multiple myeloma (HCC) 07/22/2015   Easy fatigability 06/01/2015   Foreign body of right eye 06/01/2015   Rales 1/4 way up posterior chest wall on right side 04/06/2015   Chronic low back pain 02/09/2015    Sleeping difficulty 02/09/2015   Acute left-sided low back pain without sciatica 08/12/2014   Closed compression fracture of thoracic vertebra (HCC) 07/13/2014   Lipoma of abdominal wall 07/13/2014   Slow transit constipation 06/29/2014   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   Aching pain 04/07/2014   Diverticulitis large intestine 03/03/2014   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   S/P autologous bone marrow transplantation (HCC) 03/03/2014   Hypokalemia 01/19/2014   Smoking greater than 40 pack years 12/15/2013   Smoking trying to quit 12/15/2013   Hives of unknown origin 12/08/2013   Abnormal LFTs 12/01/2013   H/O peripheral stem cell transplant (HCC) 10/23/2013   Myeloma (HCC) 04/02/2013   Basal cell carcinoma 05/10/2011   Other seborrheic keratosis 04/20/2011   Encounter for screening colonoscopy 08/30/2010   Encounter for screening for malignant neoplasm of colon 08/30/2010    ONSET DATE: 09/23/2023   REFERRING DIAG: I63.9 (ICD-10-CM) - Stroke, acute, thrombotic (HCC)  THERAPY DIAG:  Aphasia  Cognitive communication deficit  Rationale for Evaluation and Treatment: Rehabilitation  SUBJECTIVE:   SUBJECTIVE STATEMENT: I am going better.  Pt accompanied by: self and significant other  PERTINENT HISTORY: Patient is a 68 y.o. male with PMH: HTN, HLD, multiple myeloma on opioids who presented to the hospital on 09/23/23 with mild aphasia. His wife drove him to the ED. MRI of brain showed Multifocal acute ischemia within the left temporal and parietal lobes. Pt was hospitalized 8/17-8/19/25 and discharged home with his wife. He was referred for outpatient SLP therapy by Dr. Ary Cummins. Addendum: Pt was read[facility] to [facility] with another stroke on 10/04/2023 with exacerbation of aphasia.  PAIN:  Are you having pain? No  FALLS: Has patient fallen in last 6 months?  No  LIVING ENVIRONMENT: Lives with: lives with their spouse Lives in: House/apartment  PLOF:  Level of  assistance: Independent with ADLs, Independent with IADLs Employment: Retired  PATIENT GOALS: Improve speech  OBJECTIVE:  Note: Objective measures were completed at Evaluation unless otherwise noted.  DIAGNOSTIC FINDINGS:   MRI BRAIN WITH AND WITHOUT CONTRASTIMPRESSION: 1. Multifocal acute ischemia within the left temporal and parietal lobes. 2. Multifocal hyperintense T2-weighted signal within the cerebral white matter, most commonly due to chronic small vessel disease.   Electronically signed by: Franky Stanford MD 09/23/2023                                                                                                                    Previous Treatment:   Pt did not complete HEP because he stated that it was difficult. We completed  together in session with max cues for naming a general category when more specific item is listed. He completed divergent naming task with 100% acc with min cues (primarily to help direct train of thought). He described pictured items by giving most salient features with 100% acc with min assist to use list of word finding strategies. He reported that unfortunately, he is not able to have his teeth extracted until he sees a different doctor because of his cancer. He was given a divergent naming task for homework which only requires him to place a list of written words in a specific category. Next session, target open ended questions with use of word finding strategies and have Pt provide verbal summaries of information.  TREATMENT DATE: 12/03/23  Pt reports that he feels like he is improving and was excited to tell me that he called and spoke with the dental office this morning and made an appointment without assist from his wife. He was unable to tell me when his appointment is scheduled for however. He states that he has it written down at home. He was encouraged to write down the key words he wants to use during his conversations on the phone or in medical  appointments and refer back as needed. He completed sentences when SLP provided initial few words, with 90% acc with improvement noted with repetition of task. He completed a word association task with 90% acc with mi//mod cues from SLP and was given additional to complete for homework. He is making good progress toward goals. Goals will be updated next session and will plan for another 4 weeks of therapy.   PATIENT EDUCATION: Education details: create sentences with given words  Person educated: Patient and Spouse Education method: Explanation, Demonstration, and Handouts Education comprehension: verbalized understanding   GOALS: Goals reviewed with patient? Yes  SHORT TERM GOALS: Target date: 12/06/2023  Pt will implement word-finding strategies with 90% accuracy when unable to verbalize desired word in conversation/functional tasks with min assist. Baseline: 70% Goal status: Partially Met at 80%; continue goal  2.  Pt will describe objects and pictures by providing at least three salient features as judged by clinician with 90% acc when provided min cues.  Baseline: 75% Goal status: Partially Met; continue  3.  Pt will complete moderately complex abstract divergent naming tasks with 90% acc with min cues.  Baseline: mod cues Goal status: Partially Met; continue  4.  Pt will complete high level verbal expression tasks (object description, state function, provide short summary, etc) using short sentences with 90% acc and min assist. Baseline: 70% Goal status: Partially Met; continue  5.  Pt will respond to open-ended questions using a complete sentence with 80% acc and min cues. Baseline: 75% Goal status: Met  6.  Pt will follow multistep auditory directions with 90% acc when provided min cues. Baseline: mod cues and repetition required Goal status: Met  7.  Pt will write short messages and lists with 90% accuracy for overall intelligibility with allowance for minor spelling  mistakes. Baseline: 75% Goal status: ONGOING  LONG TERM GOALS: Same as short term goals   ASSESSMENT:  CLINICAL IMPRESSION: (initial evaluation 09/27/23) Patient is a 68 y.o. male who was seen today for a cognitive linguistic evaluation. Pt presents with mild/mod expressive language deficits and mild receptive language deficits which negatively impacts executive functioning. Pt primarily presents with dysnomia and reduced length of utterances for answering open ended questions. He had difficulty listing hobbies, restaurants he frequents, and  other important names. He was able to follow 2-step directions, but breakdowns occurred with lengthier/complex auditory directions. 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 He is motivated to improve his language skills and has excellent family support.   OBJECTIVE IMPAIRMENTS: include expressive language, receptive language, and aphasia. These impairments are limiting patient from effectively communicating at home and in community. Factors affecting potential to achieve goals and functional outcome are previous level of function and family/community support. Patient will benefit from skilled SLP services to address above impairments and improve overall function.  REHAB POTENTIAL: Excellent  PLAN:  SLP FREQUENCY: 2x/week  SLP DURATION: 4 weeks  PLANNED INTERVENTIONS: Cueing hierachy, Internal/external aids, Functional tasks, Multimodal communication approach, SLP instruction and feedback, Compensatory strategies, Patient/family education, [phone] Treatment of speech (30 or 45 min) , and [id number]- Speech [address], Artic, Phon, Eval Compre, Express   Thank you,  Lamar Candy, CCC-SLP [phone]  Calistro Rauf, CCC-SLP 12/03/2023, 11:37 AM

## 2023-12-04 ENCOUNTER — Other Ambulatory Visit (HOSPITAL_COMMUNITY): Payer: Self-pay | Admitting: Medical Oncology

## 2023-12-04 DIAGNOSIS — D225 Melanocytic nevi of trunk: Secondary | ICD-10-CM | POA: Diagnosis not present

## 2023-12-04 DIAGNOSIS — Z1283 Encounter for screening for malignant neoplasm of skin: Secondary | ICD-10-CM | POA: Diagnosis not present

## 2023-12-04 DIAGNOSIS — C9000 Multiple myeloma not having achieved remission: Secondary | ICD-10-CM

## 2023-12-05 ENCOUNTER — Ambulatory Visit (HOSPITAL_COMMUNITY): Payer: Self-pay | Admitting: Speech Pathology

## 2023-12-05 ENCOUNTER — Encounter (HOSPITAL_COMMUNITY): Admitting: Speech Pathology

## 2023-12-06 DIAGNOSIS — J019 Acute sinusitis, unspecified: Secondary | ICD-10-CM | POA: Diagnosis not present

## 2023-12-06 DIAGNOSIS — Z20822 Contact with and (suspected) exposure to covid-19: Secondary | ICD-10-CM | POA: Diagnosis not present

## 2023-12-17 ENCOUNTER — Ambulatory Visit (HOSPITAL_COMMUNITY): Attending: Neurology | Admitting: Speech Pathology

## 2023-12-17 ENCOUNTER — Encounter (HOSPITAL_COMMUNITY): Payer: Self-pay | Admitting: Speech Pathology

## 2023-12-17 DIAGNOSIS — R41841 Cognitive communication deficit: Secondary | ICD-10-CM | POA: Insufficient documentation

## 2023-12-17 DIAGNOSIS — R4701 Aphasia: Secondary | ICD-10-CM | POA: Insufficient documentation

## 2023-12-17 NOTE — Therapy (Signed)
 OUTPATIENT SPEECH LANGUAGE PATHOLOGY TREATMENT   Patient Name: Craig Rivera MRN: [id number] DOB:1955/04/28, 68 y.o., male Today's Date: 12/17/2023  PCP: Shona Norleen PEDLAR, MD REFERRING PROVIDER: Jerri Pfeiffer, MD  END OF SESSION:  End of Session - 12/17/23 0926     Visit Number 16    Number of Visits 24    Date for Recertification  01/08/24    Authorization Type [employer]   eff:02/07/23 oop: copay 5 auth no sw jacquline ref # [id number]   SLP Start Time 0900    SLP Stop Time  0945    SLP Time Calculation (min) 45 min    Activity Tolerance Patient tolerated treatment well          Past Medical History:  Diagnosis Date   Back pain    DVT (deep venous thrombosis) (HCC)    GERD (gastroesophageal reflux disease)    HTN (hypertension)    Hypercholesterolemia    Multiple myeloma (HCC)    Sciatic nerve pain    Past Surgical History:  Procedure Laterality Date   BONE MARROW TRANSPLANT     CATARACT EXTRACTION W/PHACO Right 08/11/2014   Procedure: CATARACT EXTRACTION PHACO AND INTRAOCULAR LENS PLACEMENT (IOC);  Surgeon: Dow JULIANNA Burke, MD;  Location: [facility];  Service: Ophthalmology;  Laterality: Right;  CDE 4.88   CATARACT EXTRACTION W/PHACO Left 10/26/2014   Procedure: CATARACT EXTRACTION PHACO AND INTRAOCULAR LENS PLACEMENT LEFT EYE CDE=8.64;  Surgeon: Dow JULIANNA Burke, MD;  Location: [facility];  Service: Ophthalmology;  Laterality: Left;   COLONOSCOPY  09/12/2010   Procedure: COLONOSCOPY;  Surgeon: Lamar CHRISTELLA Hollingshead, MD;  Location: [facility];  Service: Endoscopy;  Laterality: N/A;   COLONOSCOPY WITH PROPOFOL  N/A 01/06/2022   Procedure: COLONOSCOPY WITH PROPOFOL ;  Surgeon: Cindie Carlin POUR, DO;  Location: [facility];  Service: Endoscopy;  Laterality: N/A;  10:00am   inguinal hernia repair bilateral     as child    KNEE SURGERY Right    arthroscopy   POLYPECTOMY  01/06/2022   Procedure: POLYPECTOMY;  Surgeon: Cindie Carlin POUR, DO;  Location: [facility];  Service:  Endoscopy;;   TONSILLECTOMY     Patient Active Problem List   Diagnosis Date Noted   Ischemic stroke (HCC) 10/16/2023   Acute CVA (cerebrovascular accident) (HCC) 10/04/2023   Acute ischemic left middle cerebral artery (MCA) stroke (HCC) 10/04/2023   Weakness 10/04/2023   Acute ischemic left MCA stroke (HCC) 09/23/2023   Dizziness 05/15/2023   Other specified abnormalities of plasma proteins 11/12/2022   Impaired fasting glucose 11/12/2022   Thrombocytopenic disorder 11/12/2022   Cyst of nasal sinus 05/12/2022   Lower urinary tract symptoms due to benign prostatic hyperplasia 05/12/2022   Obesity 05/12/2022   Thyroid  nodule 05/12/2022   Dysuria 02/01/2022   Chronic pain 01/31/2022   Edema of lower extremity 01/31/2022   Gastroesophageal reflux disease without esophagitis 01/31/2022   Mixed hyperlipidemia 01/31/2022   Nausea 01/31/2022   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) 01/31/2022   Sciatica 01/31/2022   Positive colorectal cancer screening using Cologuard test 12/09/2021   Acute sinusitis 10/04/2021   Chronic sinusitis 10/04/2021   Diarrhea 10/04/2021   Encounter for antineoplastic chemotherapy 04/29/2021   Encounter for screening for other viral diseases 04/29/2021   Productive cough 12/13/2020   Upper respiratory infection 12/13/2020   Counseling and coordination of care 05/12/2020   Chronic anticoagulation 08/19/2019   Compression fracture of thoracic spine, non-traumatic, sequela 07/28/2019   Pulmonary  embolism (HCC) 09/11/2018   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   DVT of deep femoral vein, right (HCC) 11/29/2017   Cancer-related pain 07/25/2017   Essential hypertension 05/15/2017   Cough in adult 07/27/2015   Multiple myeloma (HCC) 07/22/2015   Easy fatigability 06/01/2015   Foreign body of right eye 06/01/2015   Rales 1/4 way up posterior chest wall on right side 04/06/2015   Chronic low back pain 02/09/2015    Sleeping difficulty 02/09/2015   Acute left-sided low back pain without sciatica 08/12/2014   Closed compression fracture of thoracic vertebra (HCC) 07/13/2014   Lipoma of abdominal wall 07/13/2014   Slow transit constipation 06/29/2014   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   Aching pain 04/07/2014   Diverticulitis large intestine 03/03/2014   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   S/P autologous bone marrow transplantation (HCC) 03/03/2014   Hypokalemia 01/19/2014   Smoking greater than 40 pack years 12/15/2013   Smoking trying to quit 12/15/2013   Hives of unknown origin 12/08/2013   Abnormal LFTs 12/01/2013   H/O peripheral stem cell transplant (HCC) 10/23/2013   Myeloma (HCC) 04/02/2013   Basal cell carcinoma 05/10/2011   Other seborrheic keratosis 04/20/2011   Encounter for screening colonoscopy 08/30/2010   Encounter for screening for malignant neoplasm of colon 08/30/2010    ONSET DATE: 09/23/2023   REFERRING DIAG: I63.9 (ICD-10-CM) - Stroke, acute, thrombotic (HCC)  THERAPY DIAG:  Aphasia  Cognitive communication deficit  Rationale for Evaluation and Treatment: Rehabilitation  SUBJECTIVE:   SUBJECTIVE STATEMENT: I have to go to the oral surgeon next week.  Pt accompanied by: self and significant other  PERTINENT HISTORY: Patient is a 68 y.o. male with PMH: HTN, HLD, multiple myeloma on opioids who presented to the hospital on 09/23/23 with mild aphasia. His wife drove him to the ED. MRI of brain showed Multifocal acute ischemia within the left temporal and parietal lobes. Pt was hospitalized 8/17-8/19/25 and discharged home with his wife. He was referred for outpatient SLP therapy by Dr. Ary Cummins. Addendum: Pt was read[facility] to [facility] with another stroke on 10/04/2023 with exacerbation of aphasia.  PAIN:  Are you having pain? No  FALLS: Has patient fallen in last 6 months?  No  LIVING ENVIRONMENT: Lives with: lives with their spouse Lives in:  House/apartment  PLOF:  Level of assistance: Independent with ADLs, Independent with IADLs Employment: Retired  PATIENT GOALS: Improve speech  OBJECTIVE:  Note: Objective measures were completed at Evaluation unless otherwise noted.  DIAGNOSTIC FINDINGS:   MRI BRAIN WITH AND WITHOUT CONTRASTIMPRESSION: 1. Multifocal acute ischemia within the left temporal and parietal lobes. 2. Multifocal hyperintense T2-weighted signal within the cerebral white matter, most commonly due to chronic small vessel disease.   Electronically signed by: Franky Stanford MD 09/23/2023                                                                                                                    Previous Treatment:   Pt did not complete HEP because he stated  that it was difficult. We completed together in session with max cues for naming a general category when more specific item is listed. He completed divergent naming task with 100% acc with min cues (primarily to help direct train of thought). He described pictured items by giving most salient features with 100% acc with min assist to use list of word finding strategies. He reported that unfortunately, he is not able to have his teeth extracted until he sees a different doctor because of his cancer. He was given a divergent naming task for homework which only requires him to place a list of written words in a specific category. Next session, target open ended questions with use of word finding strategies and have Pt provide verbal summaries of information.  Previous Treatment: Pt reports that he feels like he is improving and was excited to tell me that he called and spoke with the dental office this morning and made an appointment without assist from his wife. He was unable to tell me when his appointment is scheduled for however. He states that he has it written down at home. He was encouraged to write down the key words he wants to use during his conversations on  the phone or in medical appointments and refer back as needed. He completed sentences when SLP provided initial few words, with 90% acc with improvement noted with repetition of task. He completed a word association task with 90% acc with mi//mod cues from SLP and was given additional to complete for homework. He is making good progress toward goals. Goals will be updated next session and will plan for another 4 weeks of therapy.  TREATMENT DATE: 12/17/23  Pt had to cancel his last session due to another medical appointment and so goals could not be updated until today. He reported that he called two dental practices and effectively communicated his needs, received information, and scheduled additional appointments without assist from another. When asked if he has encountered communication barriers/road blocks, he stated that when he does, he alerts the listener that he has had a stroke and will need some extra time to communicate. In session, he completed writing task (sentence completion) with 95% acc and met the targeted goal. He was asked to list salient features of pictured objects (army tank, sphinx, pickles, etc) and initially did so with ~80% acc and improved to 95% acc with mi/mod cues/prompts from SLP. We then cross checked his responses to a microbiologist. He did not complete all of his homework and was asked to complete before next session (word association task). He continues to make good progress toward goals. He is generally able to communicate moderately complex thoughts with use of strategies and allowance for extra time. Recommend another 4 weeks of therapy in preparation for d/c to home program. Pt is in agreement with plan of care.   PATIENT EDUCATION: Education details: Circle associated words in a category  Person educated: Patient and Spouse Education method: Explanation, Demonstration, and Handouts Education comprehension: verbalized understanding   GOALS: Goals reviewed with  patient? Yes  SHORT TERM GOALS: Target date: 12/06/2023  Pt will implement word-finding strategies with 90% accuracy when unable to verbalize desired word in conversation/functional tasks with min assist. Baseline: 70% Goal status: Met; change to 95% with rare min cues (12/17/23)  2.  Pt will describe objects and pictures by providing at least three salient features as judged by clinician with 90% acc when provided min cues.  Baseline: 75% Goal status: Partially  Met (90% with mi/mod cues); continue  3.  Pt will complete moderately complex abstract divergent naming tasks with 90% acc with min cues.  Baseline: mod cues Goal status: Partially Met; continue  4.  Pt will complete high level verbal expression tasks (object description, state function, provide short summary, etc) using short sentences with 90% acc and min assist. Baseline: 70% Goal status: Partially Met; continue  5.  Pt will respond to open-ended questions using a complete sentence with 80% acc and min cues. Baseline: 75% Goal status: Met  6.  Pt will follow multistep auditory directions with 90% acc when provided min cues. Baseline: mod cues and repetition required Goal status: Met  7.  Pt will write short messages and lists with 90% accuracy for overall intelligibility with allowance for minor spelling mistakes. Baseline: 75% Goal status: Met  LONG TERM GOALS: Same as short term goals   ASSESSMENT:  CLINICAL IMPRESSION: (initial evaluation 09/27/23) Patient is a 68 y.o. male who was seen today for a cognitive linguistic evaluation. Pt presents with mild/mod expressive language deficits and mild receptive language deficits which negatively impacts executive functioning. Pt primarily presents with dysnomia and reduced length of utterances for answering open ended questions. He had difficulty listing hobbies, restaurants he frequents, and other important names. He was able to follow 2-step directions, but breakdowns  occurred with lengthier/complex auditory directions. 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 He is motivated to improve his language skills and has excellent family support.   OBJECTIVE IMPAIRMENTS: include expressive language, receptive language, and aphasia. These impairments are limiting patient from effectively communicating at home and in community. Factors affecting potential to achieve goals and functional outcome are previous level of function and family/community support. Patient will benefit from skilled SLP services to address above impairments and improve overall function.  REHAB POTENTIAL: Excellent  PLAN:  SLP FREQUENCY: 2x/week  SLP DURATION: 4 weeks  PLANNED INTERVENTIONS: Cueing hierachy, Internal/external aids, Functional tasks, Multimodal communication approach, SLP instruction and feedback, Compensatory strategies, Patient/family education, [phone] Treatment of speech (30 or 45 min) , and [id number]- Speech [address], [place], Phon, Eval Compre, Express   Thank you,  Lamar Candy, CCC-SLP [phone]  Joda Braatz, CCC-SLP 12/17/2023, 9:31 AM

## 2023-12-18 NOTE — Progress Notes (Signed)
 [facility]  Hematology Oncology Consultation  Followup Visit Note  Patient Name: Craig Rivera Patient Age: 68 y.o. Encounter Date: 12/25/2023  Referring Physician:  Letty Carder Bobette Raddle., MD [address] [facility] [place],  [place] [address]  Consulting Provider: Carder Bobette Letty Raddle., MD  Hematology/Oncology  Reason for Visit: Follow-up Multiple Myeloma.   Assessment/Plan: Craig Rivera is a 68 year-old gentleman diagnosed with Multiple Myeloma (IgG/Kappa) in 2015.  He received Revlimid, Velcade  and Dexamethasone  followed by Autologous Bone Marrow Transplantation.  He had a relapse in 2023. He has been treated with Pomalyst , Daratumumab  and Dexamethasone  with good response. The Pomalyst  was stopped in August 2025 due to strokes (2).   I have reviewed the laboratory, pathology, and radiology reports in detail and discussed findings with the patient.  Interval History:   02/21/2023 - The patient is clinically stable. He has neuropathy from previous therapy with Velcade . He will remain on maintenance therapy with Daratumumab  and Pomalyst . He will return for reevaluation in 1 month.   03/21/2023 - The patient has no significant complaints except for his chronic neuropathy. Lab work is stable. He will stay on maintenance therapy with Daratumumab  and Pomalyst . He will return for reevaluation in 1 month.   04/18/2023 - The patient is clinically stable. He will continue maintenance therapy with Daratumumab  and Pomalyst  for his Multiple Myeloma. He will return for evaluation of toxicity in 1 month.  05/16/2023 - Craig Rivera has a mild to moderate fatigue and a mild neuropathy. Both are stable. The lab work is also stable.  He will continue maintenance therapy for his Multiple Myeloma and return for reevaluation in a month.    06/13/2023 - The patient saw Dr. Homer at [facility] on 06/11/2023.  He was complaining of mid back pain and he recommended  a PET scan for further evaluation. The pain is chronic but it is getting slightly worse over time. The test was ordered by me but it has not been done yet.  He will continue on his present maintenance therapy for myeloma.  He will return for further evaluation in 1 month.   07/11/2023 - The patient is clinically stable but the back pain persists. The PET scan showed a new focus of mild FDG avidity involving the T6 vertebral body (SUV 4.5). No additional avid lytic or sclerotic lesions. I will continue the present maintenance therapy but I will discuss with Dr. Odella about the new finding on the PET scan. In view of his pain maybe the best option would be considering RT for pain control.  I received a message from Dr. Odella later today. He agrees with RT to the T6 area. He also recommended an MRI of the thoracic spine for further evaluation.   08/08/2023 - The patient is clinically stable. He has not started palliative RT to the T-spine. The back pain is chronic.   The MRI of the T-spine confirmed the 8 mm lesion involving T6 but not compression fracture or epidural tumor. No other lesions.  He will continue the present maintenance therapy for his Myeloma and remain under surveillance.    11/27/2023 - The patient continues to be clinically stable even though he still has some expressive aphasia related to his 2 recent strokes in August.  The Pomalyst  was stopped.    The protein studies have been stable but a PET scan in May discovered a new lesion in T6 which may be could  explain the worsening of his chronic back pain.  After discussion with Dr. Dannielle (RT) he did not think it was time to go through palliative RT.  I will repeat a PET scan this coming month of November to see if he has any new bone lesions.  He has needed multiple tooth extractions.  He is not presently on Zometa.  He will continue maintenance therapy with Daratumumab  for the time being.  He will return for reevaluation in 1  month.    12/25/2023 - The patient is clinically stable. The repeated PET scan on 12/20/2023 was once again negative for active Myeloma. He will continue maintenance therapy with Daratumumab  for the time being.  He will return for reevaluation in 1 month.  Review of Systems  Constitutional: Negative.   HENT:  Negative.    Eyes: Negative.   Respiratory: Negative.    Cardiovascular: Negative.   Gastrointestinal: Negative.   Endocrine: Negative.   Genitourinary: Negative.    Musculoskeletal: Negative.   Skin: Negative.   Neurological:  Positive for numbness and speech difficulty.  Hematological: Negative.     Vital signs for this encounter: BSA: 2.21 meters squared BP 125/85   Pulse 71   Temp 36.5 C (97.7 F) (Temporal)   Resp 18   Wt 97.6 kg (215 lb 3.2 oz)   SpO2 94%   BMI 30.30 kg/m   Physical Exam Constitutional:      Appearance: Normal appearance.  HENT:     Head: Normocephalic.     Mouth/Throat:     Mouth: Mucous membranes are moist.     Pharynx: Oropharynx is clear.  Eyes:     Extraocular Movements: Extraocular movements intact.     Conjunctiva/sclera: Conjunctivae normal.     Pupils: Pupils are equal, round, and reactive to light.  Pulmonary:     Breath sounds: Normal breath sounds.  Abdominal:     General: Abdomen is flat. Bowel sounds are normal.     Palpations: Abdomen is soft.  Musculoskeletal:        General: Normal range of motion.  Skin:    General: Skin is warm.  Neurological:     Mental Status: He is alert.     Sensory: Sensory deficit present.     Karnofsky/Lansky Performance Status 80, Normal activity with effort; some signs or symptoms of disease (ECOG equivalent 1)   Results:  WBC  Date Value Ref Range Status  11/27/2023 6.8 4.0 - 10.5 10*9/L Final  06/20/2019 4.1 3.4 - 10.8 x10E3/uL Final   HGB  Date Value Ref Range Status  11/27/2023 14.8 12.5 - 17.0 g/dL Final  94/85/7978 86.7 13.0 - 17.7 g/dL Final   HCT  Date Value Ref  Range Status  11/27/2023 42.2 36.0 - 50.0 % Final  06/20/2019 38.9 37.5 - 51.0 % Final   Platelet  Date Value Ref Range Status  11/27/2023 155 140 - 415 10*9/L Final  06/20/2019 164 150 - 450 x10E3/uL Final   LDH  Date Value Ref Range Status  06/27/2021 208 120 - 246 U/L Final  06/13/2019 205 121 - 224 IU/L Final   Creatinine  Date Value Ref Range Status  11/27/2023 1.09 0.80 - 1.30 mg/dL Final  94/85/7978 9.00 0.76 - 1.27 mg/dL Final   AST  Date Value Ref Range Status  11/27/2023 12 (L) 15 - 40 U/L Final  06/20/2019 15 0 - 40 IU/L Final   PSA Diagnostic  Date Value Ref Range Status  02/09/2015 0.4 0.0 -  4.0 ng/mL Final    Comment:    Roche ECLIA methodology.  According to the American Urological Association, Serum PSA  should decrease and remain at undetectable levels after  radical prostatectomy. The AUA defines biochemical  recurrence as an initial PSA value 0.2 ng/mL or greater  followed by a subsequent confirmatory PSA value 0.2 ng/mL  or greater. Values obtained with different assay methods or  kits cannot be used interchangeably. Results cannot be  interpreted as absolute evidence of the presence or absence  of malignant disease.  Performed at:  [facility]  [address], [place], [place]  [id number]  Lab Director: Elsie JULIANNA Dross MD, Phone:  [phone]   08/29/2013 0.5 0.0 - 4.0 ng/mL Final

## 2023-12-19 ENCOUNTER — Ambulatory Visit (HOSPITAL_COMMUNITY): Payer: Self-pay | Admitting: Speech Pathology

## 2023-12-19 ENCOUNTER — Encounter (HOSPITAL_COMMUNITY): Payer: Self-pay | Admitting: Speech Pathology

## 2023-12-19 DIAGNOSIS — R4701 Aphasia: Secondary | ICD-10-CM

## 2023-12-19 DIAGNOSIS — G4733 Obstructive sleep apnea (adult) (pediatric): Secondary | ICD-10-CM | POA: Diagnosis not present

## 2023-12-19 DIAGNOSIS — R41841 Cognitive communication deficit: Secondary | ICD-10-CM

## 2023-12-19 NOTE — Therapy (Signed)
 OUTPATIENT SPEECH LANGUAGE PATHOLOGY TREATMENT   Patient Name: Craig Rivera MRN: [id number] DOB:30-Aug-1955, 68 y.o., male Today's Date: 12/19/2023  PCP: Shona Norleen PEDLAR, MD REFERRING PROVIDER: Jerri Pfeiffer, MD  END OF SESSION:  End of Session - 12/19/23 1045     Visit Number 17    Number of Visits 24    Date for Recertification  01/08/24    Authorization Type [employer]   eff:02/07/23 oop: copay 5 auth no sw jacquline ref # [id number]   SLP Start Time 1030    SLP Stop Time  1115    SLP Time Calculation (min) 45 min    Activity Tolerance Patient tolerated treatment well          Past Medical History:  Diagnosis Date   Back pain    DVT (deep venous thrombosis) (HCC)    GERD (gastroesophageal reflux disease)    HTN (hypertension)    Hypercholesterolemia    Multiple myeloma (HCC)    Sciatic nerve pain    Past Surgical History:  Procedure Laterality Date   BONE MARROW TRANSPLANT     CATARACT EXTRACTION W/PHACO Right 08/11/2014   Procedure: CATARACT EXTRACTION PHACO AND INTRAOCULAR LENS PLACEMENT (IOC);  Surgeon: Dow JULIANNA Burke, MD;  Location: [facility];  Service: Ophthalmology;  Laterality: Right;  CDE 4.88   CATARACT EXTRACTION W/PHACO Left 10/26/2014   Procedure: CATARACT EXTRACTION PHACO AND INTRAOCULAR LENS PLACEMENT LEFT EYE CDE=8.64;  Surgeon: Dow JULIANNA Burke, MD;  Location: [facility];  Service: Ophthalmology;  Laterality: Left;   COLONOSCOPY  09/12/2010   Procedure: COLONOSCOPY;  Surgeon: Lamar CHRISTELLA Hollingshead, MD;  Location: [facility];  Service: Endoscopy;  Laterality: N/A;   COLONOSCOPY WITH PROPOFOL  N/A 01/06/2022   Procedure: COLONOSCOPY WITH PROPOFOL ;  Surgeon: Cindie Carlin POUR, DO;  Location: [facility];  Service: Endoscopy;  Laterality: N/A;  10:00am   inguinal hernia repair bilateral     as child    KNEE SURGERY Right    arthroscopy   POLYPECTOMY  01/06/2022   Procedure: POLYPECTOMY;  Surgeon: Cindie Carlin POUR, DO;  Location: [facility];  Service:  Endoscopy;;   TONSILLECTOMY     Patient Active Problem List   Diagnosis Date Noted   Ischemic stroke (HCC) 10/16/2023   Acute CVA (cerebrovascular accident) (HCC) 10/04/2023   Acute ischemic left middle cerebral artery (MCA) stroke (HCC) 10/04/2023   Weakness 10/04/2023   Acute ischemic left MCA stroke (HCC) 09/23/2023   Dizziness 05/15/2023   Other specified abnormalities of plasma proteins 11/12/2022   Impaired fasting glucose 11/12/2022   Thrombocytopenic disorder 11/12/2022   Cyst of nasal sinus 05/12/2022   Lower urinary tract symptoms due to benign prostatic hyperplasia 05/12/2022   Obesity 05/12/2022   Thyroid  nodule 05/12/2022   Dysuria 02/01/2022   Chronic pain 01/31/2022   Edema of lower extremity 01/31/2022   Gastroesophageal reflux disease without esophagitis 01/31/2022   Mixed hyperlipidemia 01/31/2022   Nausea 01/31/2022   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) 01/31/2022   Sciatica 01/31/2022   Positive colorectal cancer screening using Cologuard test 12/09/2021   Acute sinusitis 10/04/2021   Chronic sinusitis 10/04/2021   Diarrhea 10/04/2021   Encounter for antineoplastic chemotherapy 04/29/2021   Encounter for screening for other viral diseases 04/29/2021   Productive cough 12/13/2020   Upper respiratory infection 12/13/2020   Counseling and coordination of care 05/12/2020   Chronic anticoagulation 08/19/2019   Compression fracture of thoracic spine, non-traumatic, sequela 07/28/2019   Pulmonary  embolism (HCC) 09/11/2018   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   DVT of deep femoral vein, right (HCC) 11/29/2017   Cancer-related pain 07/25/2017   Essential hypertension 05/15/2017   Cough in adult 07/27/2015   Multiple myeloma (HCC) 07/22/2015   Easy fatigability 06/01/2015   Foreign body of right eye 06/01/2015   Rales 1/4 way up posterior chest wall on right side 04/06/2015   Chronic low back pain 02/09/2015    Sleeping difficulty 02/09/2015   Acute left-sided low back pain without sciatica 08/12/2014   Closed compression fracture of thoracic vertebra (HCC) 07/13/2014   Lipoma of abdominal wall 07/13/2014   Slow transit constipation 06/29/2014   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   Aching pain 04/07/2014   Diverticulitis large intestine 03/03/2014   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   S/P autologous bone marrow transplantation (HCC) 03/03/2014   Hypokalemia 01/19/2014   Smoking greater than 40 pack years 12/15/2013   Smoking trying to quit 12/15/2013   Hives of unknown origin 12/08/2013   Abnormal LFTs 12/01/2013   H/O peripheral stem cell transplant (HCC) 10/23/2013   Myeloma (HCC) 04/02/2013   Basal cell carcinoma 05/10/2011   Other seborrheic keratosis 04/20/2011   Encounter for screening colonoscopy 08/30/2010   Encounter for screening for malignant neoplasm of colon 08/30/2010    ONSET DATE: 09/23/2023   REFERRING DIAG: I63.9 (ICD-10-CM) - Stroke, acute, thrombotic (HCC)  THERAPY DIAG:  Aphasia  Cognitive communication deficit  Rationale for Evaluation and Treatment: Rehabilitation  SUBJECTIVE:   SUBJECTIVE STATEMENT: I think I want to practice more before I call.  Pt accompanied by: self and significant other  PERTINENT HISTORY: Patient is a 68 y.o. male with PMH: HTN, HLD, multiple myeloma on opioids who presented to the hospital on 09/23/23 with mild aphasia. His wife drove him to the ED. MRI of brain showed Multifocal acute ischemia within the left temporal and parietal lobes. Pt was hospitalized 8/17-8/19/25 and discharged home with his wife. He was referred for outpatient SLP therapy by Dr. Ary Cummins. Addendum: Pt was read[facility] to [facility] with another stroke on 10/04/2023 with exacerbation of aphasia.  PAIN:  Are you having pain? No  FALLS: Has patient fallen in last 6 months?  No  LIVING ENVIRONMENT: Lives with: lives with their spouse Lives in:  House/apartment  PLOF:  Level of assistance: Independent with ADLs, Independent with IADLs Employment: Retired  PATIENT GOALS: Improve speech  OBJECTIVE:  Note: Objective measures were completed at Evaluation unless otherwise noted.  DIAGNOSTIC FINDINGS:   MRI BRAIN WITH AND WITHOUT CONTRASTIMPRESSION: 1. Multifocal acute ischemia within the left temporal and parietal lobes. 2. Multifocal hyperintense T2-weighted signal within the cerebral white matter, most commonly due to chronic small vessel disease.   Electronically signed by: Franky Stanford MD 09/23/2023                                                                                                                    Previous Treatment:   Pt did not complete HEP because he stated  that it was difficult. We completed together in session with max cues for naming a general category when more specific item is listed. He completed divergent naming task with 100% acc with min cues (primarily to help direct train of thought). He described pictured items by giving most salient features with 100% acc with min assist to use list of word finding strategies. He reported that unfortunately, he is not able to have his teeth extracted until he sees a different doctor because of his cancer. He was given a divergent naming task for homework which only requires him to place a list of written words in a specific category. Next session, target open ended questions with use of word finding strategies and have Pt provide verbal summaries of information.  Previous Treatment: Pt reports that he feels like he is improving and was excited to tell me that he called and spoke with the dental office this morning and made an appointment without assist from his wife. He was unable to tell me when his appointment is scheduled for however. He states that he has it written down at home. He was encouraged to write down the key words he wants to use during his conversations on  the phone or in medical appointments and refer back as needed. He completed sentences when SLP provided initial few words, with 90% acc with improvement noted with repetition of task. He completed a word association task with 90% acc with mi//mod cues from SLP and was given additional to complete for homework. He is making good progress toward goals. Goals will be updated next session and will plan for another 4 weeks of therapy.  Previous Treatment: Pt had to cancel his last session due to another medical appointment and so goals could not be updated until today. He reported that he called two dental practices and effectively communicated his needs, received information, and scheduled additional appointments without assist from another. When asked if he has encountered communication barriers/road blocks, he stated that when he does, he alerts the listener that he has had a stroke and will need some extra time to communicate. In session, he completed writing task (sentence completion) with 95% acc and met the targeted goal. He was asked to list salient features of pictured objects (army tank, sphinx, pickles, etc) and initially did so with ~80% acc and improved to 95% acc with mi/mod cues/prompts from SLP. We then cross checked his responses to a microbiologist. He did not complete all of his homework and was asked to complete before next session (word association task). He continues to make good progress toward goals. He is generally able to communicate moderately complex thoughts with use of strategies and allowance for extra time. Recommend another 4 weeks of therapy in preparation for d/c to home program. Pt is in agreement with plan of care.   TREATMENT DATE: 12/19/23  Pt completed HEP with 100% acc. In session, SLP assisted in creating a script for him to use when calling the dental office regarding his extractions next week. He stated that he wanted to know the total cost before going to the appointment.  SLP provided mi/mod prompts for appropriate information to include (date of birth, date of procedure, wife's name and phone number, and follow up). He completed three role playing scenarios with overall accuracy of 95% after initial trials. He created sentences when given initial few words with 100% acc and wrote the responses with 100% acc. Pt continues to make good progress toward goals.  Continue plan of care.   PATIENT EDUCATION: Education details: Write 5 items for each category  Person educated: Patient and Spouse Education method: Explanation, Demonstration, and Handouts Education comprehension: verbalized understanding   GOALS: Goals reviewed with patient? Yes  SHORT TERM GOALS: Target date: 12/06/2023  Pt will implement word-finding strategies with 90% accuracy when unable to verbalize desired word in conversation/functional tasks with min assist. Baseline: 70% Goal status: Met; change to 95% with rare min cues (12/17/23)  2.  Pt will describe objects and pictures by providing at least three salient features as judged by clinician with 90% acc when provided min cues.  Baseline: 75% Goal status: Partially Met (90% with mi/mod cues); continue  3.  Pt will complete moderately complex abstract divergent naming tasks with 90% acc with min cues.  Baseline: mod cues Goal status: Partially Met; continue  4.  Pt will complete high level verbal expression tasks (object description, state function, provide short summary, etc) using short sentences with 90% acc and min assist. Baseline: 70% Goal status: Partially Met; continue  5.  Pt will respond to open-ended questions using a complete sentence with 80% acc and min cues. Baseline: 75% Goal status: Met  6.  Pt will follow multistep auditory directions with 90% acc when provided min cues. Baseline: mod cues and repetition required Goal status: Met  7.  Pt will write short messages and lists with 90% accuracy for overall  intelligibility with allowance for minor spelling mistakes. Baseline: 75% Goal status: Met  LONG TERM GOALS: Same as short term goals   ASSESSMENT:  CLINICAL IMPRESSION: (initial evaluation 09/27/23) Patient is a 68 y.o. male who was seen today for a cognitive linguistic evaluation. Pt presents with mild/mod expressive language deficits and mild receptive language deficits which negatively impacts executive functioning. Pt primarily presents with dysnomia and reduced length of utterances for answering open ended questions. He had difficulty listing hobbies, restaurants he frequents, and other important names. He was able to follow 2-step directions, but breakdowns occurred with lengthier/complex auditory directions. 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 He is motivated to improve his language skills and has excellent family support.   OBJECTIVE IMPAIRMENTS: include expressive language, receptive language, and aphasia. These impairments are limiting patient from effectively communicating at home and in community. Factors affecting potential to achieve goals and functional outcome are previous level of function and family/community support. Patient will benefit from skilled SLP services to address above impairments and improve overall function.  REHAB POTENTIAL: Excellent  PLAN:  SLP FREQUENCY: 2x/week  SLP DURATION: 4 weeks  PLANNED INTERVENTIONS: Cueing hierachy, Internal/external aids, Functional tasks, Multimodal communication approach, SLP instruction and feedback, Compensatory strategies, Patient/family education, [phone] Treatment of speech (30 or 45 min) , and [id number]- Speech [address] Artic, Phon, Eval Compre, Express   Thank you,  Lamar Candy, CCC-SLP [phone]  Arath Kaigler, CCC-SLP 12/19/2023, 10:46 AM

## 2023-12-20 ENCOUNTER — Ambulatory Visit (HOSPITAL_COMMUNITY)
Admission: RE | Admit: 2023-12-20 | Discharge: 2023-12-20 | Disposition: A | Discharge status: Home / Self Care | Source: Ambulatory Visit | Attending: Medical Oncology | Admitting: Medical Oncology

## 2023-12-20 DIAGNOSIS — C9000 Multiple myeloma not having achieved remission: Secondary | ICD-10-CM | POA: Diagnosis not present

## 2023-12-20 MED ORDER — FLUDEOXYGLUCOSE F - 18 (FDG) INJECTION
11.2300 | Freq: Once | INTRAVENOUS | Status: AC | PRN
  Administered 2023-12-20: 11.23 via INTRAVENOUS

## 2023-12-25 ENCOUNTER — Ambulatory Visit (HOSPITAL_COMMUNITY): Payer: Self-pay | Admitting: Speech Pathology

## 2023-12-25 DIAGNOSIS — Z9484 Stem cells transplant status: Secondary | ICD-10-CM | POA: Diagnosis not present

## 2023-12-25 DIAGNOSIS — Z1159 Encounter for screening for other viral diseases: Secondary | ICD-10-CM | POA: Diagnosis not present

## 2023-12-25 DIAGNOSIS — Z5111 Encounter for antineoplastic chemotherapy: Secondary | ICD-10-CM | POA: Diagnosis not present

## 2023-12-25 DIAGNOSIS — Z5112 Encounter for antineoplastic immunotherapy: Secondary | ICD-10-CM | POA: Diagnosis not present

## 2023-12-25 DIAGNOSIS — C9000 Multiple myeloma not having achieved remission: Secondary | ICD-10-CM | POA: Diagnosis not present

## 2023-12-25 NOTE — Progress Notes (Signed)
 Patient in today for a follow up on multiple myeloma  Patient arrived to the clinic and is ambulatory and without any gait issues observed,   For the visit today, patient is accompanied by his wife.  Patient vital signs and weight obtained and are stable.  Patient reports pain level has increased some in his back. He reports it today as a 3. He continues to take oxycodone  for pain as prescribed intermittently but states it's not frequently.  Denies any additional questions or concerns for the nurse at this time but is aware a call to the clinic is always an option should the need arise.   MyChart status is active.  Update provided to MD. RN will assist with referrals, test or procedures as ordered by the provider.

## 2023-12-27 ENCOUNTER — Ambulatory Visit (HOSPITAL_COMMUNITY): Payer: Self-pay | Admitting: Speech Pathology

## 2023-12-27 ENCOUNTER — Encounter (HOSPITAL_COMMUNITY): Payer: Self-pay | Admitting: Speech Pathology

## 2023-12-27 DIAGNOSIS — Z1159 Encounter for screening for other viral diseases: Secondary | ICD-10-CM | POA: Diagnosis not present

## 2023-12-27 DIAGNOSIS — R4701 Aphasia: Secondary | ICD-10-CM

## 2023-12-27 DIAGNOSIS — Z9484 Stem cells transplant status: Secondary | ICD-10-CM | POA: Diagnosis not present

## 2023-12-27 DIAGNOSIS — C9000 Multiple myeloma not having achieved remission: Secondary | ICD-10-CM | POA: Diagnosis not present

## 2023-12-27 DIAGNOSIS — R41841 Cognitive communication deficit: Secondary | ICD-10-CM

## 2023-12-27 DIAGNOSIS — Z5112 Encounter for antineoplastic immunotherapy: Secondary | ICD-10-CM | POA: Diagnosis not present

## 2023-12-27 NOTE — Therapy (Signed)
 OUTPATIENT SPEECH LANGUAGE PATHOLOGY TREATMENT   Patient Name: Craig Rivera MRN: [id number] DOB:[birth year], 68 y.o., male Today's Date: 12/27/2023  PCP: Shona Norleen PEDLAR, MD REFERRING PROVIDER: Jerri Pfeiffer, MD  END OF SESSION:  End of Session - 12/27/23 1047     Visit Number 18    Number of Visits 24    Date for Recertification  01/08/24    Authorization Type [employer]   eff:02/07/23 oop: copay 5 auth no sw jacquline ref # [id number]   SLP Start Time 1035    SLP Stop Time  1120    SLP Time Calculation (min) 45 min    Activity Tolerance Patient tolerated treatment well          Past Medical History:  Diagnosis Date   Back pain    DVT (deep venous thrombosis) (HCC)    GERD (gastroesophageal reflux disease)    HTN (hypertension)    Hypercholesterolemia    Multiple myeloma (HCC)    Sciatic nerve pain    Past Surgical History:  Procedure Laterality Date   BONE MARROW TRANSPLANT     CATARACT EXTRACTION W/PHACO Right 08/11/2014   Procedure: CATARACT EXTRACTION PHACO AND INTRAOCULAR LENS PLACEMENT (IOC);  Surgeon: Dow JULIANNA Burke, MD;  Location: [facility];  Service: Ophthalmology;  Laterality: Right;  CDE 4.88   CATARACT EXTRACTION W/PHACO Left 10/26/2014   Procedure: CATARACT EXTRACTION PHACO AND INTRAOCULAR LENS PLACEMENT LEFT EYE CDE=8.64;  Surgeon: Dow JULIANNA Burke, MD;  Location: [facility];  Service: Ophthalmology;  Laterality: Left;   COLONOSCOPY  09/12/2010   Procedure: COLONOSCOPY;  Surgeon: Lamar CHRISTELLA Hollingshead, MD;  Location: [facility];  Service: Endoscopy;  Laterality: N/A;   COLONOSCOPY WITH PROPOFOL  N/A 01/06/2022   Procedure: COLONOSCOPY WITH PROPOFOL ;  Surgeon: Cindie Carlin POUR, DO;  Location: [facility];  Service: Endoscopy;  Laterality: N/A;  10:00am   inguinal hernia repair bilateral     as child    KNEE SURGERY Right    arthroscopy   POLYPECTOMY  01/06/2022   Procedure: POLYPECTOMY;  Surgeon: Cindie Carlin POUR, DO;  Location: [facility];  Service:  Endoscopy;;   TONSILLECTOMY     Patient Active Problem List   Diagnosis Date Noted   Ischemic stroke (HCC) 10/16/2023   Acute CVA (cerebrovascular accident) (HCC) 10/04/2023   Acute ischemic left middle cerebral artery (MCA) stroke (HCC) 10/04/2023   Weakness 10/04/2023   Acute ischemic left MCA stroke (HCC) 09/23/2023   Dizziness 05/15/2023   Other specified abnormalities of plasma proteins 11/12/2022   Impaired fasting glucose 11/12/2022   Thrombocytopenic disorder 11/12/2022   Cyst of nasal sinus 05/12/2022   Lower urinary tract symptoms due to benign prostatic hyperplasia 05/12/2022   Obesity 05/12/2022   Thyroid  nodule 05/12/2022   Dysuria 02/01/2022   Chronic pain 01/31/2022   Edema of lower extremity 01/31/2022   Gastroesophageal reflux disease without esophagitis 01/31/2022   Mixed hyperlipidemia 01/31/2022   Nausea 01/31/2022   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) 01/31/2022   Sciatica 01/31/2022   Positive colorectal cancer screening using Cologuard test 12/09/2021   Acute sinusitis 10/04/2021   Chronic sinusitis 10/04/2021   Diarrhea 10/04/2021   Encounter for antineoplastic chemotherapy 04/29/2021   Encounter for screening for other viral diseases 04/29/2021   Productive cough 12/13/2020   Upper respiratory infection 12/13/2020   Counseling and coordination of care 05/12/2020   Chronic anticoagulation 08/19/2019   Compression fracture of thoracic spine, non-traumatic, sequela 07/28/2019   Pulmonary  embolism (HCC) 09/11/2018   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   DVT of deep femoral vein, right (HCC) 11/29/2017   Cancer-related pain 07/25/2017   Essential hypertension 05/15/2017   Cough in adult 07/27/2015   Multiple myeloma (HCC) 07/22/2015   Easy fatigability 06/01/2015   Foreign body of right eye 06/01/2015   Rales 1/4 way up posterior chest wall on right side 04/06/2015   Chronic low back pain 02/09/2015    Sleeping difficulty 02/09/2015   Acute left-sided low back pain without sciatica 08/12/2014   Closed compression fracture of thoracic vertebra (HCC) 07/13/2014   Lipoma of abdominal wall 07/13/2014   Slow transit constipation 06/29/2014   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   Aching pain 04/07/2014   Diverticulitis large intestine 03/03/2014   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   S/P autologous bone marrow transplantation (HCC) 03/03/2014   Hypokalemia 01/19/2014   Smoking greater than 40 pack years 12/15/2013   Smoking trying to quit 12/15/2013   Hives of unknown origin 12/08/2013   Abnormal LFTs 12/01/2013   H/O peripheral stem cell transplant (HCC) 10/23/2013   Myeloma (HCC) 04/02/2013   Basal cell carcinoma 05/10/2011   Other seborrheic keratosis 04/20/2011   Encounter for screening colonoscopy 08/30/2010   Encounter for screening for malignant neoplasm of colon 08/30/2010    ONSET DATE: 09/23/2023   REFERRING DIAG: I63.9 (ICD-10-CM) - Stroke, acute, thrombotic (HCC)  THERAPY DIAG:  Aphasia  Cognitive communication deficit  Rationale for Evaluation and Treatment: Rehabilitation  SUBJECTIVE:   SUBJECTIVE STATEMENT: I didn't get my teeth done.  Pt accompanied by: self and significant other  PERTINENT HISTORY: Patient is a 68 y.o. male with PMH: HTN, HLD, multiple myeloma on opioids who presented to the hospital on 09/23/23 with mild aphasia. His wife drove him to the ED. MRI of brain showed Multifocal acute ischemia within the left temporal and parietal lobes. Pt was hospitalized 8/17-8/19/25 and discharged home with his wife. He was referred for outpatient SLP therapy by Dr. Ary Cummins. Addendum: Pt was readmitted to [facility] with another stroke on 10/04/2023 with exacerbation of aphasia.  PAIN:  Are you having pain? No  FALLS: Has patient fallen in last 6 months?  No  LIVING ENVIRONMENT: Lives with: lives with their spouse Lives in: House/apartment  PLOF:  Level  of assistance: Independent with ADLs, Independent with IADLs Employment: Retired  PATIENT GOALS: Improve speech  OBJECTIVE:  Note: Objective measures were completed at Evaluation unless otherwise noted.  DIAGNOSTIC FINDINGS:   MRI BRAIN WITH AND WITHOUT CONTRASTIMPRESSION: 1. Multifocal acute ischemia within the left temporal and parietal lobes. 2. Multifocal hyperintense T2-weighted signal within the cerebral white matter, most commonly due to chronic small vessel disease.   Electronically signed by: Franky Stanford MD 09/23/2023                                                                                                                    Previous Treatment:   Pt did not complete HEP because he stated that it was difficult.  We completed together in session with max cues for naming a general category when more specific item is listed. He completed divergent naming task with 100% acc with min cues (primarily to help direct train of thought). He described pictured items by giving most salient features with 100% acc with min assist to use list of word finding strategies. He reported that unfortunately, he is not able to have his teeth extracted until he sees a different doctor because of his cancer. He was given a divergent naming task for homework which only requires him to place a list of written words in a specific category. Next session, target open ended questions with use of word finding strategies and have Pt provide verbal summaries of information.  Previous Treatment: Pt reports that he feels like he is improving and was excited to tell me that he called and spoke with the dental office this morning and made an appointment without assist from his wife. He was unable to tell me when his appointment is scheduled for however. He states that he has it written down at home. He was encouraged to write down the key words he wants to use during his conversations on the phone or in medical  appointments and refer back as needed. He completed sentences when SLP provided initial few words, with 90% acc with improvement noted with repetition of task. He completed a word association task with 90% acc with mi//mod cues from SLP and was given additional to complete for homework. He is making good progress toward goals. Goals will be updated next session and will plan for another 4 weeks of therapy.  Previous Treatment: Pt had to cancel his last session due to another medical appointment and so goals could not be updated until today. He reported that he called two dental practices and effectively communicated his needs, received information, and scheduled additional appointments without assist from another. When asked if he has encountered communication barriers/road blocks, he stated that when he does, he alerts the listener that he has had a stroke and will need some extra time to communicate. In session, he completed writing task (sentence completion) with 95% acc and met the targeted goal. He was asked to list salient features of pictured objects (army tank, sphinx, pickles, etc) and initially did so with ~80% acc and improved to 95% acc with mi/mod cues/prompts from SLP. We then cross checked his responses to a microbiologist. He did not complete all of his homework and was asked to complete before next session (word association task). He continues to make good progress toward goals. He is generally able to communicate moderately complex thoughts with use of strategies and allowance for extra time. Recommend another 4 weeks of therapy in preparation for d/c to home program. Pt is in agreement with plan of care.   Previous Treatment: Pt completed HEP with 100% acc. In session, SLP assisted in creating a script for him to use when calling the dental office regarding his extractions next week. He stated that he wanted to know the total cost before going to the appointment. SLP provided mi/mod prompts  for appropriate information to include (date of birth, date of procedure, wife's name and phone number, and follow up). He completed three role playing scenarios with overall accuracy of 95% after initial trials. He created sentences when given initial few words with 100% acc and wrote the responses with 100% acc. Pt continues to make good progress toward goals. Continue plan of care.  TREATMENT  DATE: 12/27/23   Pt did not have his teeth extracted due to price. He called the day before his procedure to inquire about price and it was going to be over $5000 for just the extraction. He had his sleep study last week, but has not received the results. He indicates that his PET scan was clear. He did not complete his homework. He indicates his speech is improved, but he does not have many conversation partners throughout the day. In session, he orally read sentences aloud with 100% acc. He completed a moderate level naming task to create a new compound word when given the first part of a word with 95% acc with min cues. SLP administered the [employer] and he scored a 58/60 (error for palette and accordion). His writing has improved significantly and confrontation naming is WNL. He still needs extra time to verbalize moderately complex thoughts aloud. Continue to target goals in preparation for d/c next week.    PATIENT EDUCATION: Education details: Write 5 items for each category  Person educated: Patient and Spouse Education method: Explanation, Demonstration, and Handouts Education comprehension: verbalized understanding   GOALS: Goals reviewed with patient? Yes  SHORT TERM GOALS: Target date: 12/06/2023  Pt will implement word-finding strategies with 90% accuracy when unable to verbalize desired word in conversation/functional tasks with min assist. Baseline: 70% Goal status: Met; change to 95% with rare min cues (12/17/23)  2.  Pt will describe objects and pictures by providing at least  three salient features as judged by clinician with 90% acc when provided min cues.  Baseline: 75% Goal status: Partially Met (90% with mi/mod cues); continue  3.  Pt will complete moderately complex abstract divergent naming tasks with 90% acc with min cues.  Baseline: mod cues Goal status: Partially Met; continue  4.  Pt will complete high level verbal expression tasks (object description, state function, provide short summary, etc) using short sentences with 90% acc and min assist. Baseline: 70% Goal status: Partially Met; continue  5.  Pt will respond to open-ended questions using a complete sentence with 80% acc and min cues. Baseline: 75% Goal status: Met  6.  Pt will follow multistep auditory directions with 90% acc when provided min cues. Baseline: mod cues and repetition required Goal status: Met  7.  Pt will write short messages and lists with 90% accuracy for overall intelligibility with allowance for minor spelling mistakes. Baseline: 75% Goal status: Met  LONG TERM GOALS: Same as short term goals   ASSESSMENT:  CLINICAL IMPRESSION: (initial evaluation 09/27/23) Patient is a 68 y.o. male who was seen today for a cognitive linguistic evaluation. Pt presents with mild/mod expressive language deficits and mild receptive language deficits which negatively impacts executive functioning. Pt primarily presents with dysnomia and reduced length of utterances for answering open ended questions. He had difficulty listing hobbies, restaurants he frequents, and other important names. He was able to follow 2-step directions, but breakdowns occurred with lengthier/complex auditory directions. 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 He is motivated to improve his language skills and has excellent family support.   OBJECTIVE IMPAIRMENTS: include expressive language, receptive  language, and aphasia. These impairments are limiting patient from effectively communicating at home and in community. Factors affecting potential to achieve goals and functional outcome are previous level of function and family/community support. Patient will benefit from skilled SLP services to  address above impairments and improve overall function.  REHAB POTENTIAL: Excellent  PLAN:  SLP FREQUENCY: 2x/week  SLP DURATION: 4 weeks  PLANNED INTERVENTIONS: Cueing hierachy, Internal/external aids, Functional tasks, Multimodal communication approach, SLP instruction and feedback, Compensatory strategies, Patient/family education, [phone] Treatment of speech (30 or 45 min) , and [id number]- Speech [address] Artic, Phon, Eval Compre, Express   Thank you,  Lamar Candy, CCC-SLP [phone]  Aramis Weil, CCC-SLP 12/27/2023, 10:48 AM

## 2023-12-31 ENCOUNTER — Encounter (HOSPITAL_COMMUNITY): Payer: Self-pay | Admitting: Speech Pathology

## 2023-12-31 ENCOUNTER — Ambulatory Visit (HOSPITAL_COMMUNITY): Payer: Self-pay | Admitting: Speech Pathology

## 2023-12-31 DIAGNOSIS — R4701 Aphasia: Secondary | ICD-10-CM | POA: Diagnosis not present

## 2023-12-31 DIAGNOSIS — R41841 Cognitive communication deficit: Secondary | ICD-10-CM

## 2023-12-31 NOTE — Therapy (Signed)
 OUTPATIENT SPEECH LANGUAGE PATHOLOGY TREATMENT   Patient Name: Craig Rivera MRN: [id number] DOB:[birth year], 68 y.o., male Today's Date: 12/31/2023  PCP: Shona Norleen PEDLAR, MD REFERRING PROVIDER: Jerri Pfeiffer, MD  END OF SESSION:  End of Session - 12/31/23 1147     Visit Number 19    Number of Visits 24    Date for Recertification  01/08/24    Authorization Type [employer]   eff:02/07/23 oop: copay 5 auth no sw jacquline ref # [id number]   SLP Start Time 1120    SLP Stop Time  1205    SLP Time Calculation (min) 45 min    Activity Tolerance Patient tolerated treatment well          Past Medical History:  Diagnosis Date   Back pain    DVT (deep venous thrombosis) (HCC)    GERD (gastroesophageal reflux disease)    HTN (hypertension)    Hypercholesterolemia    Multiple myeloma (HCC)    Sciatic nerve pain    Past Surgical History:  Procedure Laterality Date   BONE MARROW TRANSPLANT     CATARACT EXTRACTION W/PHACO Right 08/11/2014   Procedure: CATARACT EXTRACTION PHACO AND INTRAOCULAR LENS PLACEMENT (IOC);  Surgeon: Dow JULIANNA Burke, MD;  Location: [facility];  Service: Ophthalmology;  Laterality: Right;  CDE 4.88   CATARACT EXTRACTION W/PHACO Left 10/26/2014   Procedure: CATARACT EXTRACTION PHACO AND INTRAOCULAR LENS PLACEMENT LEFT EYE CDE=8.64;  Surgeon: Dow JULIANNA Burke, MD;  Location: [facility];  Service: Ophthalmology;  Laterality: Left;   COLONOSCOPY  09/12/2010   Procedure: COLONOSCOPY;  Surgeon: Lamar CHRISTELLA Hollingshead, MD;  Location: [facility];  Service: Endoscopy;  Laterality: N/A;   COLONOSCOPY WITH PROPOFOL  N/A 01/06/2022   Procedure: COLONOSCOPY WITH PROPOFOL ;  Surgeon: Cindie Carlin POUR, DO;  Location: [facility];  Service: Endoscopy;  Laterality: N/A;  10:00am   inguinal hernia repair bilateral     as child    KNEE SURGERY Right    arthroscopy   POLYPECTOMY  01/06/2022   Procedure: POLYPECTOMY;  Surgeon: Cindie Carlin POUR, DO;  Location: [facility];  Service:  Endoscopy;;   TONSILLECTOMY     Patient Active Problem List   Diagnosis Date Noted   Ischemic stroke (HCC) 10/16/2023   Acute CVA (cerebrovascular accident) (HCC) 10/04/2023   Acute ischemic left middle cerebral artery (MCA) stroke (HCC) 10/04/2023   Weakness 10/04/2023   Acute ischemic left MCA stroke (HCC) 09/23/2023   Dizziness 05/15/2023   Other specified abnormalities of plasma proteins 11/12/2022   Impaired fasting glucose 11/12/2022   Thrombocytopenic disorder 11/12/2022   Cyst of nasal sinus 05/12/2022   Lower urinary tract symptoms due to benign prostatic hyperplasia 05/12/2022   Obesity 05/12/2022   Thyroid  nodule 05/12/2022   Dysuria 02/01/2022   Chronic pain 01/31/2022   Edema of lower extremity 01/31/2022   Gastroesophageal reflux disease without esophagitis 01/31/2022   Mixed hyperlipidemia 01/31/2022   Nausea 01/31/2022   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) 01/31/2022   Sciatica 01/31/2022   Positive colorectal cancer screening using Cologuard test 12/09/2021   Acute sinusitis 10/04/2021   Chronic sinusitis 10/04/2021   Diarrhea 10/04/2021   Encounter for antineoplastic chemotherapy 04/29/2021   Encounter for screening for other viral diseases 04/29/2021   Productive cough 12/13/2020   Upper respiratory infection 12/13/2020   Counseling and coordination of care 05/12/2020   Chronic anticoagulation 08/19/2019   Compression fracture of thoracic spine, non-traumatic, sequela 07/28/2019   Pulmonary  embolism (HCC) 09/11/2018   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   DVT of deep femoral vein, right (HCC) 11/29/2017   Cancer-related pain 07/25/2017   Essential hypertension 05/15/2017   Cough in adult 07/27/2015   Multiple myeloma (HCC) 07/22/2015   Easy fatigability 06/01/2015   Foreign body of right eye 06/01/2015   Rales 1/4 way up posterior chest wall on right side 04/06/2015   Chronic low back pain 02/09/2015    Sleeping difficulty 02/09/2015   Acute left-sided low back pain without sciatica 08/12/2014   Closed compression fracture of thoracic vertebra (HCC) 07/13/2014   Lipoma of abdominal wall 07/13/2014   Slow transit constipation 06/29/2014   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   Aching pain 04/07/2014   Diverticulitis large intestine 03/03/2014   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   S/P autologous bone marrow transplantation (HCC) 03/03/2014   Hypokalemia 01/19/2014   Smoking greater than 40 pack years 12/15/2013   Smoking trying to quit 12/15/2013   Hives of unknown origin 12/08/2013   Abnormal LFTs 12/01/2013   H/O peripheral stem cell transplant (HCC) 10/23/2013   Myeloma (HCC) 04/02/2013   Basal cell carcinoma 05/10/2011   Other seborrheic keratosis 04/20/2011   Encounter for screening colonoscopy 08/30/2010   Encounter for screening for malignant neoplasm of colon 08/30/2010    ONSET DATE: 09/23/2023   REFERRING DIAG: I63.9 (ICD-10-CM) - Stroke, acute, thrombotic (HCC)  THERAPY DIAG:  Aphasia  Cognitive communication deficit  Rationale for Evaluation and Treatment: Rehabilitation  SUBJECTIVE:   SUBJECTIVE STATEMENT: I might drive by there today. ([facility])  Pt accompanied by: self and significant other  PERTINENT HISTORY: Patient is a 68 y.o. male with PMH: HTN, HLD, multiple myeloma on opioids who presented to the hospital on 09/23/23 with mild aphasia. His wife drove him to the ED. MRI of brain showed Multifocal acute ischemia within the left temporal and parietal lobes. Pt was hospitalized 8/17-8/19/25 and discharged home with his wife. He was referred for outpatient SLP therapy by Dr. Ary Cummins. Addendum: Pt was readmitted to [facility] with another stroke on 10/04/2023 with exacerbation of aphasia.  PAIN:  Are you having pain? No  FALLS: Has patient fallen in last 6 months?  No  LIVING ENVIRONMENT: Lives with: lives with their spouse Lives in:  House/apartment  PLOF:  Level of assistance: Independent with ADLs, Independent with IADLs Employment: Retired  PATIENT GOALS: Improve speech  OBJECTIVE:  Note: Objective measures were completed at Evaluation unless otherwise noted.  DIAGNOSTIC FINDINGS:   MRI BRAIN WITH AND WITHOUT CONTRASTIMPRESSION: 1. Multifocal acute ischemia within the left temporal and parietal lobes. 2. Multifocal hyperintense T2-weighted signal within the cerebral white matter, most commonly due to chronic small vessel disease.   Electronically signed by: Franky Stanford MD 09/23/2023                                                                                                                    Previous Treatment:   Pt did not complete HEP because he stated that it  was difficult. We completed together in session with max cues for naming a general category when more specific item is listed. He completed divergent naming task with 100% acc with min cues (primarily to help direct train of thought). He described pictured items by giving most salient features with 100% acc with min assist to use list of word finding strategies. He reported that unfortunately, he is not able to have his teeth extracted until he sees a different doctor because of his cancer. He was given a divergent naming task for homework which only requires him to place a list of written words in a specific category. Next session, target open ended questions with use of word finding strategies and have Pt provide verbal summaries of information.  Previous Treatment: Pt reports that he feels like he is improving and was excited to tell me that he called and spoke with the dental office this morning and made an appointment without assist from his wife. He was unable to tell me when his appointment is scheduled for however. He states that he has it written down at home. He was encouraged to write down the key words he wants to use during his conversations on  the phone or in medical appointments and refer back as needed. He completed sentences when SLP provided initial few words, with 90% acc with improvement noted with repetition of task. He completed a word association task with 90% acc with mi//mod cues from SLP and was given additional to complete for homework. He is making good progress toward goals. Goals will be updated next session and will plan for another 4 weeks of therapy.  Previous Treatment: Pt had to cancel his last session due to another medical appointment and so goals could not be updated until today. He reported that he called two dental practices and effectively communicated his needs, received information, and scheduled additional appointments without assist from another. When asked if he has encountered communication barriers/road blocks, he stated that when he does, he alerts the listener that he has had a stroke and will need some extra time to communicate. In session, he completed writing task (sentence completion) with 95% acc and met the targeted goal. He was asked to list salient features of pictured objects (army tank, sphinx, pickles, etc) and initially did so with ~80% acc and improved to 95% acc with mi/mod cues/prompts from SLP. We then cross checked his responses to a microbiologist. He did not complete all of his homework and was asked to complete before next session (word association task). He continues to make good progress toward goals. He is generally able to communicate moderately complex thoughts with use of strategies and allowance for extra time. Recommend another 4 weeks of therapy in preparation for d/c to home program. Pt is in agreement with plan of care.   Previous Treatment: Pt completed HEP with 100% acc. In session, SLP assisted in creating a script for him to use when calling the dental office regarding his extractions next week. He stated that he wanted to know the total cost before going to the appointment. SLP  provided mi/mod prompts for appropriate information to include (date of birth, date of procedure, wife's name and phone number, and follow up). He completed three role playing scenarios with overall accuracy of 95% after initial trials. He created sentences when given initial few words with 100% acc and wrote the responses with 100% acc. Pt continues to make good progress toward goals. Continue plan of care.  Previous Treatment: Pt did not have his teeth extracted due to price. He called the day before his procedure to inquire about price and it was going to be over $5000 for just the extraction. He had his sleep study last week, but has not received the results. He indicates that his PET scan was clear. He did not complete his homework. He indicates his speech is improved, but he does not have many conversation partners throughout the day. In session, he orally read sentences aloud with 100% acc. He completed a moderate level naming task to create a new compound word when given the first part of a word with 95% acc with min cues. SLP administered the [employer] and he scored a 58/60 (error for palette and accordion). His writing has improved significantly and confrontation naming is WNL. He still needs extra time to verbalize moderately complex thoughts aloud. Continue to target goals in preparation for d/c next week.   TREATMENT DATE: 12/31/23  [place] completed homework and we reviewed together in session. He plans to go to another dental place to see about obtaining dentures. He was asked to write to dictation, the phone number, office hours, and location of the office and he did so with 100% acc. He was then asked to identify information he wants to ask them when he stops by and did so with indirect cues. In structured tasks, he completed a naming to description task with 100% acc with min assist. In conversation, he used word finding strategies with rare min cues to convey words he couldn't think of.  He reports feeling positive about his progress and is ready for discharge. Pt has one more scheduled visit this Wednesday and anticipate d/c from SLP services after that session.   PATIENT EDUCATION: Education details:  opposite task Person educated: Patient and Spouse Education method: Explanation, Facilities Manager, and Handouts Education comprehension: verbalized understanding   GOALS: Goals reviewed with patient? Yes  SHORT TERM GOALS: Target date: 12/06/2023  Pt will implement word-finding strategies with 90% accuracy when unable to verbalize desired word in conversation/functional tasks with min assist. Baseline: 70% Goal status: Met; change to 95% with rare min cues (12/17/23)  2.  Pt will describe objects and pictures by providing at least three salient features as judged by clinician with 90% acc when provided min cues.  Baseline: 75% Goal status: Partially Met (90% with mi/mod cues); continue  3.  Pt will complete moderately complex abstract divergent naming tasks with 90% acc with min cues.  Baseline: mod cues Goal status: Partially Met; continue  4.  Pt will complete high level verbal expression tasks (object description, state function, provide short summary, etc) using short sentences with 90% acc and min assist. Baseline: 70% Goal status: Partially Met; continue  5.  Pt will respond to open-ended questions using a complete sentence with 80% acc and min cues. Baseline: 75% Goal status: Met  6.  Pt will follow multistep auditory directions with 90% acc when provided min cues. Baseline: mod cues and repetition required Goal status: Met  7.  Pt will write short messages and lists with 90% accuracy for overall intelligibility with allowance for minor spelling mistakes. Baseline: 75% Goal status: Met  LONG TERM GOALS: Same as short term goals   ASSESSMENT:  CLINICAL IMPRESSION: (initial evaluation 09/27/23) Patient is a 68 y.o. male who was seen today for a  cognitive linguistic evaluation. Pt presents with mild/mod expressive language deficits and mild receptive language deficits which negatively impacts  executive functioning. Pt primarily presents with dysnomia and reduced length of utterances for answering open ended questions. He had difficulty listing hobbies, restaurants he frequents, and other important names. He was able to follow 2-step directions, but breakdowns occurred with lengthier/complex auditory directions. 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 He is motivated to improve his language skills and has excellent family support.   OBJECTIVE IMPAIRMENTS: include expressive language, receptive language, and aphasia. These impairments are limiting patient from effectively communicating at home and in community. Factors affecting potential to achieve goals and functional outcome are previous level of function and family/community support. Patient will benefit from skilled SLP services to address above impairments and improve overall function.  REHAB POTENTIAL: Excellent  PLAN:  SLP FREQUENCY: 2x/week  SLP DURATION: 4 weeks  PLANNED INTERVENTIONS: Cueing hierachy, Internal/external aids, Functional tasks, Multimodal communication approach, SLP instruction and feedback, Compensatory strategies, Patient/family education, [phone] Treatment of speech (30 or 45 min) , and [id number]- Speech [phone], Artic, Phon, Eval Compre, Express   Thank you,  Lamar Candy, CCC-SLP [phone]  Lisandro Meggett, CCC-SLP 12/31/2023, 11:49 AM

## 2024-01-02 ENCOUNTER — Encounter (HOSPITAL_COMMUNITY): Payer: Self-pay | Admitting: Speech Pathology

## 2024-01-02 ENCOUNTER — Ambulatory Visit (HOSPITAL_COMMUNITY): Payer: Self-pay | Admitting: Speech Pathology

## 2024-01-02 DIAGNOSIS — R4701 Aphasia: Secondary | ICD-10-CM | POA: Diagnosis not present

## 2024-01-02 DIAGNOSIS — R41841 Cognitive communication deficit: Secondary | ICD-10-CM

## 2024-01-02 NOTE — Therapy (Signed)
 OUTPATIENT SPEECH LANGUAGE PATHOLOGY TREATMENT   Patient Name: Craig Rivera MRN: [id number] DOB:February 19, 1955, 68 y.o., male Today's Date: 01/02/2024  PCP: Shona Norleen PEDLAR, MD REFERRING PROVIDER: Jerri Pfeiffer, MD  END OF SESSION:  End of Session - 01/02/24 1140     Visit Number 20    Number of Visits 24    Date for Recertification  01/08/24    Authorization Type [employer]   eff:02/07/23 oop: copay 5 auth no sw jacquline ref # [id number]   SLP Start Time 1125    SLP Stop Time  1210    SLP Time Calculation (min) 45 min    Activity Tolerance Patient tolerated treatment well          Past Medical History:  Diagnosis Date   Back pain    DVT (deep venous thrombosis) (HCC)    GERD (gastroesophageal reflux disease)    HTN (hypertension)    Hypercholesterolemia    Multiple myeloma (HCC)    Sciatic nerve pain    Past Surgical History:  Procedure Laterality Date   BONE MARROW TRANSPLANT     CATARACT EXTRACTION W/PHACO Right 08/11/2014   Procedure: CATARACT EXTRACTION PHACO AND INTRAOCULAR LENS PLACEMENT (IOC);  Surgeon: Dow JULIANNA Burke, MD;  Location: [facility];  Service: Ophthalmology;  Laterality: Right;  CDE 4.88   CATARACT EXTRACTION W/PHACO Left 10/26/2014   Procedure: CATARACT EXTRACTION PHACO AND INTRAOCULAR LENS PLACEMENT LEFT EYE CDE=8.64;  Surgeon: Dow JULIANNA Burke, MD;  Location: [facility];  Service: Ophthalmology;  Laterality: Left;   COLONOSCOPY  09/12/2010   Procedure: COLONOSCOPY;  Surgeon: Lamar CHRISTELLA Hollingshead, MD;  Location: [facility];  Service: Endoscopy;  Laterality: N/A;   COLONOSCOPY WITH PROPOFOL  N/A 01/06/2022   Procedure: COLONOSCOPY WITH PROPOFOL ;  Surgeon: Cindie Carlin POUR, DO;  Location: [facility];  Service: Endoscopy;  Laterality: N/A;  10:00am   inguinal hernia repair bilateral     as child    KNEE SURGERY Right    arthroscopy   POLYPECTOMY  01/06/2022   Procedure: POLYPECTOMY;  Surgeon: Cindie Carlin POUR, DO;  Location: [facility];  Service:  Endoscopy;;   TONSILLECTOMY     Patient Active Problem List   Diagnosis Date Noted   Ischemic stroke (HCC) 10/16/2023   Acute CVA (cerebrovascular accident) (HCC) 10/04/2023   Acute ischemic left middle cerebral artery (MCA) stroke (HCC) 10/04/2023   Weakness 10/04/2023   Acute ischemic left MCA stroke (HCC) 09/23/2023   Dizziness 05/15/2023   Other specified abnormalities of plasma proteins 11/12/2022   Impaired fasting glucose 11/12/2022   Thrombocytopenic disorder 11/12/2022   Cyst of nasal sinus 05/12/2022   Lower urinary tract symptoms due to benign prostatic hyperplasia 05/12/2022   Obesity 05/12/2022   Thyroid  nodule 05/12/2022   Dysuria 02/01/2022   Chronic pain 01/31/2022   Edema of lower extremity 01/31/2022   Gastroesophageal reflux disease without esophagitis 01/31/2022   Mixed hyperlipidemia 01/31/2022   Nausea 01/31/2022   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) 01/31/2022   Sciatica 01/31/2022   Positive colorectal cancer screening using Cologuard test 12/09/2021   Acute sinusitis 10/04/2021   Chronic sinusitis 10/04/2021   Diarrhea 10/04/2021   Encounter for antineoplastic chemotherapy 04/29/2021   Encounter for screening for other viral diseases 04/29/2021   Productive cough 12/13/2020   Upper respiratory infection 12/13/2020   Counseling and coordination of care 05/12/2020   Chronic anticoagulation 08/19/2019   Compression fracture of thoracic spine, non-traumatic, sequela 07/28/2019   Pulmonary  embolism (HCC) 09/11/2018   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   DVT of deep femoral vein, right (HCC) 11/29/2017   Cancer-related pain 07/25/2017   Essential hypertension 05/15/2017   Cough in adult 07/27/2015   Multiple myeloma (HCC) 07/22/2015   Easy fatigability 06/01/2015   Foreign body of right eye 06/01/2015   Rales 1/4 way up posterior chest wall on right side 04/06/2015   Chronic low back pain 02/09/2015    Sleeping difficulty 02/09/2015   Acute left-sided low back pain without sciatica 08/12/2014   Closed compression fracture of thoracic vertebra (HCC) 07/13/2014   Lipoma of abdominal wall 07/13/2014   Slow transit constipation 06/29/2014   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   Aching pain 04/07/2014   Diverticulitis large intestine 03/03/2014   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   S/P autologous bone marrow transplantation (HCC) 03/03/2014   Hypokalemia 01/19/2014   Smoking greater than 40 pack years 12/15/2013   Smoking trying to quit 12/15/2013   Hives of unknown origin 12/08/2013   Abnormal LFTs 12/01/2013   H/O peripheral stem cell transplant (HCC) 10/23/2013   Myeloma (HCC) 04/02/2013   Basal cell carcinoma 05/10/2011   Other seborrheic keratosis 04/20/2011   Encounter for screening colonoscopy 08/30/2010   Encounter for screening for malignant neoplasm of colon 08/30/2010    ONSET DATE: 09/23/2023   REFERRING DIAG: I63.9 (ICD-10-CM) - Stroke, acute, thrombotic (HCC)  THERAPY DIAG:  Aphasia  Cognitive communication deficit  Rationale for Evaluation and Treatment: Rehabilitation  SUBJECTIVE:   SUBJECTIVE STATEMENT: It was the first time I didn't mix up my words.  Pt accompanied by: self and significant other  PERTINENT HISTORY: Patient is a 68 y.o. male with PMH: HTN, HLD, multiple myeloma on opioids who presented to the hospital on 09/23/23 with mild aphasia. His wife drove him to the ED. MRI of brain showed Multifocal acute ischemia within the left temporal and parietal lobes. Pt was hospitalized 8/17-8/19/25 and discharged home with his wife. He was referred for outpatient SLP therapy by Dr. Ary Cummins. Addendum: Pt was [facility] with another stroke on 10/04/2023 with exacerbation of aphasia.  PAIN:  Are you having pain? No  FALLS: Has patient fallen in last 6 months?  No  LIVING ENVIRONMENT: Lives with: lives with their spouse Lives in:  House/apartment  PLOF:  Level of assistance: Independent with ADLs, Independent with IADLs Employment: Retired  PATIENT GOALS: Improve speech  OBJECTIVE:  Note: Objective measures were completed at Evaluation unless otherwise noted.  DIAGNOSTIC FINDINGS:   MRI BRAIN WITH AND WITHOUT CONTRASTIMPRESSION: 1. Multifocal acute ischemia within the left temporal and parietal lobes. 2. Multifocal hyperintense T2-weighted signal within the cerebral white matter, most commonly due to chronic small vessel disease.   Electronically signed by: Franky Stanford MD 09/23/2023                                                                                                                    Previous Treatment:   Pt did not complete HEP because he  stated that it was difficult. We completed together in session with max cues for naming a general category when more specific item is listed. He completed divergent naming task with 100% acc with min cues (primarily to help direct train of thought). He described pictured items by giving most salient features with 100% acc with min assist to use list of word finding strategies. He reported that unfortunately, he is not able to have his teeth extracted until he sees a different doctor because of his cancer. He was given a divergent naming task for homework which only requires him to place a list of written words in a specific category. Next session, target open ended questions with use of word finding strategies and have Pt provide verbal summaries of information.  Previous Treatment: Pt reports that he feels like he is improving and was excited to tell me that he called and spoke with the dental office this morning and made an appointment without assist from his wife. He was unable to tell me when his appointment is scheduled for however. He states that he has it written down at home. He was encouraged to write down the key words he wants to use during his conversations on  the phone or in medical appointments and refer back as needed. He completed sentences when SLP provided initial few words, with 90% acc with improvement noted with repetition of task. He completed a word association task with 90% acc with mi//mod cues from SLP and was given additional to complete for homework. He is making good progress toward goals. Goals will be updated next session and will plan for another 4 weeks of therapy.  Previous Treatment: Pt had to cancel his last session due to another medical appointment and so goals could not be updated until today. He reported that he called two dental practices and effectively communicated his needs, received information, and scheduled additional appointments without assist from another. When asked if he has encountered communication barriers/road blocks, he stated that when he does, he alerts the listener that he has had a stroke and will need some extra time to communicate. In session, he completed writing task (sentence completion) with 95% acc and met the targeted goal. He was asked to list salient features of pictured objects (army tank, sphinx, pickles, etc) and initially did so with ~80% acc and improved to 95% acc with mi/mod cues/prompts from SLP. We then cross checked his responses to a microbiologist. He did not complete all of his homework and was asked to complete before next session (word association task). He continues to make good progress toward goals. He is generally able to communicate moderately complex thoughts with use of strategies and allowance for extra time. Recommend another 4 weeks of therapy in preparation for d/c to home program. Pt is in agreement with plan of care.   Previous Treatment: Pt completed HEP with 100% acc. In session, SLP assisted in creating a script for him to use when calling the dental office regarding his extractions next week. He stated that he wanted to know the total cost before going to the appointment. SLP  provided mi/mod prompts for appropriate information to include (date of birth, date of procedure, wife's name and phone number, and follow up). He completed three role playing scenarios with overall accuracy of 95% after initial trials. He created sentences when given initial few words with 100% acc and wrote the responses with 100% acc. Pt continues to make good progress toward goals. Continue  plan of care.  Previous Treatment: Pt did not have his teeth extracted due to price. He called the day before his procedure to inquire about price and it was going to be over $5000 for just the extraction. He had his sleep study last week, but has not received the results. He indicates that his PET scan was clear. He did not complete his homework. He indicates his speech is improved, but he does not have many conversation partners throughout the day. In session, he orally read sentences aloud with 100% acc. He completed a moderate level naming task to create a new compound word when given the first part of a word with 95% acc with min cues. SLP administered the [employer] and he scored a 58/60 (error for palette and accordion). His writing has improved significantly and confrontation naming is WNL. He still needs extra time to verbalize moderately complex thoughts aloud. Continue to target goals in preparation for d/c next week.   Previous Treatment: Craig Rivera completed homework and we reviewed together in session. He plans to go to another dental place to see about obtaining dentures. He was asked to write to dictation, the phone number, office hours, and location of the office and he did so with 100% acc. He was then asked to identify information he wants to ask them when he stops by and did so with indirect cues. In structured tasks, he completed a naming to description task with 100% acc with min assist. In conversation, he used word finding strategies with rare min cues to convey words he couldn't think of. He  reports feeling positive about his progress and is ready for discharge. Pt has one more scheduled visit this Wednesday and anticipate d/c from SLP services after that session.   TREATMENT DATE: 01/02/24  Pt stated that he drove to a dental extraction place on [facility], but they were closed for Thanksgiving. He also went by the place we discussed during our previous session and will be going back on Monday for extractions. He stated that it was the first time he felt prepared and able to express himself normally and did not stumble over his words. SLP provided ongoing HEP of verbal and written expression tasks for Pt to continue post discharge. We completed several items from each page before moving onto the next to ensure understanding of tasks. He was encouraged to keep working on expressive goals post discharge by listening to podcasts and telling someone about them, reading, talking with friends and family, and completing HEP. He completed synonym and antonym task with 100% acc with rare min cues. He required mi/mod cues to generate expansive sentences (6+ words) with each given word. He is pleased with his current level of function and is ready for d/c from SLP therapy. Pt has made significant improvement and currently presents with mild expressive language deficits. D/C from SLP.  PATIENT EDUCATION: Education details:  Reviewed ongoing HEP provided post d/c Person educated: Patient and Spouse Education method: Explanation, Demonstration, and Handouts Education comprehension: verbalized understanding   GOALS: Goals reviewed with patient? Yes  SHORT TERM GOALS: Target date: 01/08/2024  Pt will implement word-finding strategies with 90% accuracy when unable to verbalize desired word in conversation/functional tasks with min assist. Baseline: 70% Goal status: Met; change to 95% with rare min cues (12/17/23)- MET  2.  Pt will describe objects and pictures by providing at least three  salient features as judged by clinician with 90% acc when provided min cues.  Baseline: 75% Goal status: Partially Met (90% with mi/mod cues); MET  3.  Pt will complete moderately complex abstract divergent naming tasks with 90% acc with min cues.  Baseline: mod cues Goal status: MET  4.  Pt will complete high level verbal expression tasks (object description, state function, provide short summary, etc) using short sentences with 90% acc and min assist. Baseline: 70% Goal status: MET  5.  Pt will respond to open-ended questions using a complete sentence with 80% acc and min cues. Baseline: 75% Goal status: Met  6.  Pt will follow multistep auditory directions with 90% acc when provided min cues. Baseline: mod cues and repetition required Goal status: Met  7.  Pt will write short messages and lists with 90% accuracy for overall intelligibility with allowance for minor spelling mistakes. Baseline: 75% Goal status: Met  LONG TERM GOALS: Same as short term goals   ASSESSMENT:  CLINICAL IMPRESSION: (initial evaluation 09/27/23) Patient is a 68 y.o. male who was seen today for a cognitive linguistic evaluation. Pt presents with mild/mod expressive language deficits and mild receptive language deficits which negatively impacts executive functioning. Pt primarily presents with dysnomia and reduced length of utterances for answering open ended questions. He had difficulty listing hobbies, restaurants he frequents, and other important names. He was able to follow 2-step directions, but breakdowns occurred with lengthier/complex auditory directions. 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 He is motivated to improve his language skills and has excellent family support.   OBJECTIVE IMPAIRMENTS: include expressive language, receptive language, and aphasia. These impairments are limiting  patient from effectively communicating at home and in community. Factors affecting potential to achieve goals and functional outcome are previous level of function and family/community support. Patient will benefit from skilled SLP services to address above impairments and improve overall function.  REHAB POTENTIAL: Excellent  PLAN:  SLP FREQUENCY: D/C from SLP therapy  PLANNED INTERVENTIONS: Cueing hierachy, Internal/external aids, Functional tasks, Multimodal communication approach, SLP instruction and feedback, Compensatory strategies, Patient/family education, [phone] Treatment of speech (30 or 45 min) , and [id number]- Speech Eval Sound Prod, Artic, Phon, Eval Compre, Express  SPEECH THERAPY DISCHARGE SUMMARY  Visits from Start of Care: 20  Current functional level related to goals / functional outcomes: Goals met and Pt is pleased with current function and ready for d/c. See above   Remaining deficits: Mild expressive aphasia   Education / Equipment: HEP provided   Patient agrees to discharge. Patient goals were met. Patient is being discharged due to being pleased with the current functional level..    Thank you,  Lamar Candy, CCC-SLP [phone]  Esco Joslyn, CCC-SLP 01/02/2024, 11:42 AM

## 2024-02-13 ENCOUNTER — Encounter: Payer: Self-pay | Admitting: *Deleted

## 2024-02-13 NOTE — Progress Notes (Signed)
 COURTEZ TWADDLE                                          MRN: [id number]   02/13/2024   The [facility] Quality Team Specialist reviewed this patient medical record for the purposes of chart review for care gap closure. The following were reviewed: chart review for care gap closure-controlling blood pressure.    [facility] Quality Team
